# Patient Record
Sex: Male | Born: 1955
Health system: Southern US, Community
[De-identification: ages and names within clinical notes are randomized; demographics above are authoritative.]

## PROBLEM LIST (undated history)

## (undated) DIAGNOSIS — M549 Dorsalgia, unspecified: Secondary | ICD-10-CM

## (undated) DIAGNOSIS — J189 Pneumonia, unspecified organism: Secondary | ICD-10-CM

## (undated) DIAGNOSIS — F32A Depression, unspecified: Secondary | ICD-10-CM

## (undated) DIAGNOSIS — U071 COVID-19: Secondary | ICD-10-CM

## (undated) DIAGNOSIS — I10 Essential (primary) hypertension: Secondary | ICD-10-CM

## (undated) DIAGNOSIS — M1990 Unspecified osteoarthritis, unspecified site: Secondary | ICD-10-CM

## (undated) DIAGNOSIS — M67919 Unspecified disorder of synovium and tendon, unspecified shoulder: Secondary | ICD-10-CM

## (undated) DIAGNOSIS — F329 Major depressive disorder, single episode, unspecified: Secondary | ICD-10-CM

## (undated) DIAGNOSIS — H269 Unspecified cataract: Secondary | ICD-10-CM

## (undated) DIAGNOSIS — E119 Type 2 diabetes mellitus without complications: Secondary | ICD-10-CM

## (undated) DIAGNOSIS — E785 Hyperlipidemia, unspecified: Secondary | ICD-10-CM

## (undated) DIAGNOSIS — N289 Disorder of kidney and ureter, unspecified: Secondary | ICD-10-CM

## (undated) HISTORY — PX: LITHOTRIPSY: SUR834

## (undated) HISTORY — PX: VASECTOMY: SHX75

## (undated) HISTORY — PX: EYE SURGERY: SHX253

## (undated) HISTORY — PX: FOOT SURGERY: SHX648

## (undated) HISTORY — PX: HERNIA REPAIR: SHX51

## (undated) HISTORY — PX: ANTERIOR FUSION CERVICAL SPINE: SUR626

---

## 2001-04-06 ENCOUNTER — Encounter: Payer: Self-pay | Admitting: Emergency Medicine

## 2001-04-06 ENCOUNTER — Emergency Department (HOSPITAL_COMMUNITY): Admission: EM | Admit: 2001-04-06 | Discharge: 2001-04-06 | Payer: Self-pay | Admitting: Emergency Medicine

## 2002-04-22 ENCOUNTER — Encounter: Admission: RE | Admit: 2002-04-22 | Discharge: 2002-04-22 | Payer: Self-pay | Admitting: Gastroenterology

## 2002-04-22 ENCOUNTER — Encounter: Payer: Self-pay | Admitting: Gastroenterology

## 2004-01-19 ENCOUNTER — Emergency Department (HOSPITAL_COMMUNITY): Admission: EM | Admit: 2004-01-19 | Discharge: 2004-01-20 | Payer: Self-pay | Admitting: Emergency Medicine

## 2005-06-20 HISTORY — PX: ANTERIOR FUSION CERVICAL SPINE: SUR626

## 2011-07-28 DIAGNOSIS — R319 Hematuria, unspecified: Secondary | ICD-10-CM | POA: Insufficient documentation

## 2011-07-28 DIAGNOSIS — Z87442 Personal history of urinary calculi: Secondary | ICD-10-CM | POA: Insufficient documentation

## 2011-11-15 DIAGNOSIS — R319 Hematuria, unspecified: Secondary | ICD-10-CM | POA: Insufficient documentation

## 2013-08-12 ENCOUNTER — Ambulatory Visit (INDEPENDENT_AMBULATORY_CARE_PROVIDER_SITE_OTHER): Payer: Managed Care, Other (non HMO) | Admitting: Physical Therapy

## 2013-08-12 DIAGNOSIS — M6281 Muscle weakness (generalized): Secondary | ICD-10-CM

## 2013-08-12 DIAGNOSIS — R293 Abnormal posture: Secondary | ICD-10-CM

## 2013-08-12 DIAGNOSIS — M4802 Spinal stenosis, cervical region: Secondary | ICD-10-CM

## 2013-08-12 DIAGNOSIS — M2550 Pain in unspecified joint: Secondary | ICD-10-CM

## 2013-08-12 DIAGNOSIS — M2560 Stiffness of unspecified joint, not elsewhere classified: Secondary | ICD-10-CM

## 2013-08-19 ENCOUNTER — Encounter (INDEPENDENT_AMBULATORY_CARE_PROVIDER_SITE_OTHER): Payer: Managed Care, Other (non HMO) | Admitting: Physical Therapy

## 2013-08-19 DIAGNOSIS — M4802 Spinal stenosis, cervical region: Secondary | ICD-10-CM

## 2013-08-19 DIAGNOSIS — R293 Abnormal posture: Secondary | ICD-10-CM

## 2013-08-19 DIAGNOSIS — M2560 Stiffness of unspecified joint, not elsewhere classified: Secondary | ICD-10-CM

## 2013-08-19 DIAGNOSIS — M6281 Muscle weakness (generalized): Secondary | ICD-10-CM

## 2013-08-19 DIAGNOSIS — M2550 Pain in unspecified joint: Secondary | ICD-10-CM

## 2013-08-21 ENCOUNTER — Encounter (INDEPENDENT_AMBULATORY_CARE_PROVIDER_SITE_OTHER): Payer: Managed Care, Other (non HMO) | Admitting: Physical Therapy

## 2013-08-21 DIAGNOSIS — M6281 Muscle weakness (generalized): Secondary | ICD-10-CM

## 2013-08-21 DIAGNOSIS — M2550 Pain in unspecified joint: Secondary | ICD-10-CM

## 2013-08-21 DIAGNOSIS — M4802 Spinal stenosis, cervical region: Secondary | ICD-10-CM

## 2013-08-21 DIAGNOSIS — R293 Abnormal posture: Secondary | ICD-10-CM

## 2013-08-21 DIAGNOSIS — M2560 Stiffness of unspecified joint, not elsewhere classified: Secondary | ICD-10-CM

## 2013-08-26 ENCOUNTER — Encounter (INDEPENDENT_AMBULATORY_CARE_PROVIDER_SITE_OTHER): Payer: Managed Care, Other (non HMO) | Admitting: Physical Therapy

## 2013-08-26 DIAGNOSIS — M2550 Pain in unspecified joint: Secondary | ICD-10-CM

## 2013-08-26 DIAGNOSIS — M4802 Spinal stenosis, cervical region: Secondary | ICD-10-CM

## 2013-08-26 DIAGNOSIS — M6281 Muscle weakness (generalized): Secondary | ICD-10-CM

## 2013-08-26 DIAGNOSIS — M2560 Stiffness of unspecified joint, not elsewhere classified: Secondary | ICD-10-CM

## 2013-08-28 ENCOUNTER — Encounter (INDEPENDENT_AMBULATORY_CARE_PROVIDER_SITE_OTHER): Payer: Managed Care, Other (non HMO) | Admitting: Physical Therapy

## 2013-08-28 DIAGNOSIS — M2550 Pain in unspecified joint: Secondary | ICD-10-CM

## 2013-08-28 DIAGNOSIS — M6281 Muscle weakness (generalized): Secondary | ICD-10-CM

## 2013-08-28 DIAGNOSIS — M4802 Spinal stenosis, cervical region: Secondary | ICD-10-CM

## 2013-08-28 DIAGNOSIS — M2560 Stiffness of unspecified joint, not elsewhere classified: Secondary | ICD-10-CM

## 2013-08-28 DIAGNOSIS — R293 Abnormal posture: Secondary | ICD-10-CM

## 2013-09-02 ENCOUNTER — Encounter (INDEPENDENT_AMBULATORY_CARE_PROVIDER_SITE_OTHER): Payer: Managed Care, Other (non HMO) | Admitting: Physical Therapy

## 2013-09-02 DIAGNOSIS — M4802 Spinal stenosis, cervical region: Secondary | ICD-10-CM

## 2013-09-02 DIAGNOSIS — M2560 Stiffness of unspecified joint, not elsewhere classified: Secondary | ICD-10-CM

## 2013-09-02 DIAGNOSIS — M6281 Muscle weakness (generalized): Secondary | ICD-10-CM

## 2013-09-02 DIAGNOSIS — M2550 Pain in unspecified joint: Secondary | ICD-10-CM

## 2013-09-06 ENCOUNTER — Encounter (INDEPENDENT_AMBULATORY_CARE_PROVIDER_SITE_OTHER): Payer: Managed Care, Other (non HMO) | Admitting: Physical Therapy

## 2013-09-06 DIAGNOSIS — R293 Abnormal posture: Secondary | ICD-10-CM

## 2013-09-06 DIAGNOSIS — M4802 Spinal stenosis, cervical region: Secondary | ICD-10-CM

## 2013-09-06 DIAGNOSIS — M6281 Muscle weakness (generalized): Secondary | ICD-10-CM

## 2013-09-06 DIAGNOSIS — M2550 Pain in unspecified joint: Secondary | ICD-10-CM

## 2013-09-09 ENCOUNTER — Encounter (INDEPENDENT_AMBULATORY_CARE_PROVIDER_SITE_OTHER): Payer: Managed Care, Other (non HMO)

## 2013-09-09 DIAGNOSIS — M4802 Spinal stenosis, cervical region: Secondary | ICD-10-CM

## 2013-09-09 DIAGNOSIS — R293 Abnormal posture: Secondary | ICD-10-CM

## 2013-09-09 DIAGNOSIS — M2560 Stiffness of unspecified joint, not elsewhere classified: Secondary | ICD-10-CM

## 2013-09-09 DIAGNOSIS — M6281 Muscle weakness (generalized): Secondary | ICD-10-CM

## 2013-09-09 DIAGNOSIS — M2550 Pain in unspecified joint: Secondary | ICD-10-CM

## 2013-09-11 ENCOUNTER — Encounter (INDEPENDENT_AMBULATORY_CARE_PROVIDER_SITE_OTHER): Payer: Managed Care, Other (non HMO)

## 2013-09-11 DIAGNOSIS — M2560 Stiffness of unspecified joint, not elsewhere classified: Secondary | ICD-10-CM

## 2013-09-11 DIAGNOSIS — R293 Abnormal posture: Secondary | ICD-10-CM

## 2013-09-11 DIAGNOSIS — M6281 Muscle weakness (generalized): Secondary | ICD-10-CM

## 2013-09-11 DIAGNOSIS — M2550 Pain in unspecified joint: Secondary | ICD-10-CM

## 2013-09-14 ENCOUNTER — Emergency Department
Admission: EM | Admit: 2013-09-14 | Discharge: 2013-09-14 | Disposition: A | Discharge status: Home / Self Care | Payer: Managed Care, Other (non HMO) | Source: Home / Self Care | Attending: Family Medicine | Admitting: Family Medicine

## 2013-09-14 ENCOUNTER — Encounter: Payer: Self-pay | Admitting: Emergency Medicine

## 2013-09-14 ENCOUNTER — Emergency Department (INDEPENDENT_AMBULATORY_CARE_PROVIDER_SITE_OTHER): Payer: Managed Care, Other (non HMO)

## 2013-09-14 DIAGNOSIS — M25569 Pain in unspecified knee: Secondary | ICD-10-CM

## 2013-09-14 DIAGNOSIS — S93402A Sprain of unspecified ligament of left ankle, initial encounter: Secondary | ICD-10-CM

## 2013-09-14 DIAGNOSIS — M25579 Pain in unspecified ankle and joints of unspecified foot: Secondary | ICD-10-CM

## 2013-09-14 DIAGNOSIS — S93409A Sprain of unspecified ligament of unspecified ankle, initial encounter: Secondary | ICD-10-CM

## 2013-09-14 DIAGNOSIS — S93602A Unspecified sprain of left foot, initial encounter: Secondary | ICD-10-CM

## 2013-09-14 DIAGNOSIS — S93609A Unspecified sprain of unspecified foot, initial encounter: Secondary | ICD-10-CM

## 2013-09-14 DIAGNOSIS — S83429A Sprain of lateral collateral ligament of unspecified knee, initial encounter: Secondary | ICD-10-CM

## 2013-09-14 HISTORY — DX: Depression, unspecified: F32.A

## 2013-09-14 HISTORY — DX: Dorsalgia, unspecified: M54.9

## 2013-09-14 HISTORY — DX: Disorder of kidney and ureter, unspecified: N28.9

## 2013-09-14 HISTORY — DX: Essential (primary) hypertension: I10

## 2013-09-14 HISTORY — DX: Type 2 diabetes mellitus without complications: E11.9

## 2013-09-14 HISTORY — DX: Major depressive disorder, single episode, unspecified: F32.9

## 2013-09-14 HISTORY — DX: Hyperlipidemia, unspecified: E78.5

## 2013-09-14 NOTE — ED Notes (Signed)
Reports falling down 2 steps at home and twisted while landing with resulting pain in left ankle, knee and hip; 2 hours ago; no open wounds; ambulatory; used ice packs prior to arrival but no meds.

## 2013-09-14 NOTE — ED Provider Notes (Signed)
CSN: [id number]     Arrival date & time 09/14/13  1230 History   First MD Initiated Contact with Patient 09/14/13 1242     Chief Complaint  Patient presents with  . Leg Pain      HPI Comments: Patient had some mud on his shoe and slipped at the top of four steps.  He fell, and his left foot caught under the top tread as he was falling.  He twisted his left knee, ankle and foot.  He states that he has lower leg peripheral neuropathy and normally has decreased sensation in his feet and toes.   Patient is a 58 y.o. male presenting with leg pain. The history is provided by the patient and the spouse.  Leg Pain Location:  Ankle, foot and knee Time since incident:  2 hours Injury: yes   Mechanism of injury: fall   Fall:    Fall occurred:  Down stairs   Impact surface:  Hard floor   Point of impact: right hip.   Entrapped after fall: no   Knee location:  L knee Ankle location:  L ankle Foot location:  L foot Pain details:    Quality:  Aching   Radiates to: left thigh.   Severity:  Moderate   Onset quality:  Sudden   Duration:  2 hours   Timing:  Constant   Progression:  Unchanged Chronicity:  New Dislocation: no   Prior injury to area:  No Relieved by:  None tried Worsened by:  Bearing weight and flexion Ineffective treatments:  None tried Associated symptoms: decreased ROM, stiffness and swelling   Associated symptoms: no back pain, no muscle weakness, no numbness and no tingling     Past Medical History  Diagnosis Date  . Hyperlipidemia   . Hypertension   . Diabetes mellitus without complication   . Renal disorder     calculi  . Spinal pain   . Depression    Past Surgical History  Procedure Laterality Date  . Vasectomy Bilateral   . Hernia repair    . Lithotripsy    . Foot surgery    . Anterior fusion cervical spine     Family History  Problem Relation Age of Onset  . Hypertension Mother   . Heart failure Father   . Diabetes Father   . Hypertension Father     . Heart failure Sister   . Hypertension Sister    History  Substance Use Topics  . Smoking status: Current Every Day Smoker  . Smokeless tobacco: Not on file  . Alcohol Use: Yes    Review of Systems  Musculoskeletal: Positive for stiffness. Negative for back pain.  All other systems reviewed and are negative.    Allergies  Review of patient's allergies indicates no known allergies.  Home Medications   Current Outpatient Rx  Name  Route  Sig  Dispense  Refill  . aspirin 81 MG chewable tablet   Oral   Chew by mouth daily.         [name] buPROPion (WELLBUTRIN XL) 300 MG 24 hr tablet   Oral   Take 300 mg by mouth daily.         [name] gabapentin (NEURONTIN) 300 MG capsule   Oral   Take 300 mg by mouth 3 (three) times daily.         [name] glipiZIDE (GLUCOTROL) 10 MG tablet   Oral   Take 10 mg by mouth daily before breakfast.         .  lisinopril-hydrochlorothiazide (PRINZIDE,ZESTORETIC) 20-25 MG per tablet   Oral   Take 1 tablet by mouth daily.         . meloxicam (MOBIC) 7.5 MG tablet   Oral   Take 7.5 mg by mouth daily.         . simvastatin (ZOCOR) 40 MG tablet   Oral   Take 40 mg by mouth daily.         [name]. zolpidem (AMBIEN) 5 MG tablet   Oral   Take 10 mg by mouth at bedtime as needed for sleep.          BP 110/74  Pulse 98  Temp(Src) 97.6 F (36.4 C) (Oral)  Resp 16  Ht 6\' 1"  (1.854 m)  Wt 235 lb (106.595 kg)  BMI 31.01 kg/m2  SpO2 99% Physical Exam  Nursing note and vitals reviewed. Constitutional: He is oriented to person, place, and time. He appears well-developed and well-nourished. No distress.  Patient is obese (BMI 31.0)  HENT:  Head: Atraumatic.  Eyes: Conjunctivae are normal. Pupils are equal, round, and reactive to light.  Cardiovascular: Normal heart sounds.   Pulmonary/Chest: Breath sounds normal.  Abdominal: There is no tenderness.  Musculoskeletal:       Left knee: He exhibits bony tenderness. He exhibits normal range of  motion, no swelling, no effusion, no ecchymosis, no deformity, no laceration, normal alignment, no LCL laxity, normal patellar mobility, normal meniscus and no MCL laxity. Tenderness found. Lateral joint line and LCL tenderness noted. No MCL and no patellar tendon tenderness noted.       Left ankle: He exhibits decreased range of motion and swelling. He exhibits no ecchymosis, no deformity, no laceration and normal pulse. Tenderness. Lateral malleolus, AITFL, CF ligament, posterior TFL and proximal fibula tenderness found. No medial malleolus and no head of 5th metatarsal tenderness found. Achilles tendon normal.       Legs:      Left foot: He exhibits tenderness, bony tenderness and swelling. He exhibits normal range of motion, normal capillary refill, no crepitus, no deformity and no laceration.       Feet:  Left knee has good passive range of motion.  Negative swelling/effusion.  Tenderness over lateral joint line and LCL.  Negative McMurray test.  Negative drawer test.  Left ankle has tenderness/swelling over lateral malleolus.  Left foot has tenderness dorsally over distal metatarsals.  Decreased sensation as result of peripheral neuropathy.  Left hip has good range of motion without tenderness to palpation.  Neurological: He is alert and oriented to person, place, and time.  Skin: Skin is warm and dry.    ED Course  Procedures  none   Imaging Review Dg Ankle Complete Left  09/14/2013   CLINICAL DATA:  Fall and pain.  EXAM: LEFT ANKLE COMPLETE - 3+ VIEW  COMPARISON:  DG FOOT COMPLETE*L* dated 09/14/2013  FINDINGS: Negative for a fracture or dislocation. Mild spurring involving the calcaneus. Small spur along the dorsal aspect of the navicular bone.  IMPRESSION: No acute bone abnormality to the left ankle.   Electronically Signed   By: Richarda OverlieAdam  Henn M.D.   On: 09/14/2013 13:50   Dg Knee Complete 4 Views Left  09/14/2013   CLINICAL DATA:  Fall.  Twisting injury to left knee.  Pain.  EXAM:  LEFT KNEE - COMPLETE 4+ VIEW  COMPARISON:  None.  FINDINGS: There is no evidence of fracture, dislocation, or joint effusion. There is no evidence of arthropathy or other focal bone  abnormality. Soft tissues are unremarkable.  IMPRESSION: Negative.   Electronically Signed   By: Charlett Nose M.D.   On: 09/14/2013 13:51   Dg Foot Complete Left  09/14/2013   CLINICAL DATA:  Fall and left foot pain.  EXAM: LEFT FOOT - COMPLETE 3+ VIEW  COMPARISON:  None.  FINDINGS: Mild irregularity of the second toe proximal phalanx could represent an old injury. Alignment of the foot is normal. Small spurs along the plantar aspect of the calcaneus. No acute fracture or dislocation.  IMPRESSION: No acute bone abnormality to the left foot.   Electronically Signed   By: Richarda Overlie M.D.   On: 09/14/2013 13:47     MDM   1. Lateral collateral ligament sprain of knee   2. Left ankle sprain   3. Sprain of left foot    Ace wrap applied to left knee.  Ace wrap applied to left ankle and foot.  Ankle brace dispensed. Apply ice pack for 30 minutes every 1 to 2 hours today and tomorrow.  Elevate.  Use crutches for 3 to 5 days (already has crutches at home). Wear Ace wraps until swelling decreases.  Wear ankle brace for about 2 to 3 weeks.  Begin range of motion and stretching exercises in about 5 days as per instruction sheet. May take Ibuprofen 200mg , 3 or 4 tabs every 8 hours with food.  Followup with Dr[facility] ([facility]) if not improving about two weeks.     Lattie Haw, MD 09/14/13 1430

## 2013-09-14 NOTE — Discharge Instructions (Signed)
Apply ice pack for 30 minutes every 1 to 2 hours today and tomorrow.  Elevate.  Use crutches for 3 to 5 days.  Wear Ace wraps until swelling decreases.  Wear ankle brace for about 2 to 3 weeks.  Begin range of motion and stretching exercises in about 5 days as per instruction sheet. May take Ibuprofen 200mg , 3 or 4 tabs every 8 hours with food.

## 2013-09-16 ENCOUNTER — Encounter (INDEPENDENT_AMBULATORY_CARE_PROVIDER_SITE_OTHER): Payer: Managed Care, Other (non HMO) | Admitting: Physical Therapy

## 2013-09-16 DIAGNOSIS — M6281 Muscle weakness (generalized): Secondary | ICD-10-CM

## 2013-09-16 DIAGNOSIS — M2560 Stiffness of unspecified joint, not elsewhere classified: Secondary | ICD-10-CM

## 2013-09-16 DIAGNOSIS — M2550 Pain in unspecified joint: Secondary | ICD-10-CM

## 2013-09-16 DIAGNOSIS — M4802 Spinal stenosis, cervical region: Secondary | ICD-10-CM

## 2013-09-16 DIAGNOSIS — R293 Abnormal posture: Secondary | ICD-10-CM

## 2013-09-18 ENCOUNTER — Encounter (INDEPENDENT_AMBULATORY_CARE_PROVIDER_SITE_OTHER): Payer: Managed Care, Other (non HMO)

## 2013-09-18 DIAGNOSIS — M2560 Stiffness of unspecified joint, not elsewhere classified: Secondary | ICD-10-CM

## 2013-09-18 DIAGNOSIS — M6281 Muscle weakness (generalized): Secondary | ICD-10-CM

## 2013-09-18 DIAGNOSIS — M4802 Spinal stenosis, cervical region: Secondary | ICD-10-CM

## 2013-09-18 DIAGNOSIS — M2550 Pain in unspecified joint: Secondary | ICD-10-CM

## 2013-09-18 DIAGNOSIS — R293 Abnormal posture: Secondary | ICD-10-CM

## 2013-09-23 ENCOUNTER — Encounter (INDEPENDENT_AMBULATORY_CARE_PROVIDER_SITE_OTHER): Payer: Managed Care, Other (non HMO) | Admitting: Physical Therapy

## 2013-09-23 DIAGNOSIS — M2550 Pain in unspecified joint: Secondary | ICD-10-CM

## 2013-09-23 DIAGNOSIS — R293 Abnormal posture: Secondary | ICD-10-CM

## 2013-09-23 DIAGNOSIS — M6281 Muscle weakness (generalized): Secondary | ICD-10-CM

## 2013-09-23 DIAGNOSIS — M2560 Stiffness of unspecified joint, not elsewhere classified: Secondary | ICD-10-CM

## 2013-09-23 DIAGNOSIS — M4802 Spinal stenosis, cervical region: Secondary | ICD-10-CM

## 2013-09-25 ENCOUNTER — Encounter (INDEPENDENT_AMBULATORY_CARE_PROVIDER_SITE_OTHER): Payer: Managed Care, Other (non HMO)

## 2013-09-25 DIAGNOSIS — R293 Abnormal posture: Secondary | ICD-10-CM

## 2013-09-25 DIAGNOSIS — M4802 Spinal stenosis, cervical region: Secondary | ICD-10-CM

## 2013-09-25 DIAGNOSIS — M6281 Muscle weakness (generalized): Secondary | ICD-10-CM

## 2013-09-25 DIAGNOSIS — M2550 Pain in unspecified joint: Secondary | ICD-10-CM

## 2013-09-25 DIAGNOSIS — M2560 Stiffness of unspecified joint, not elsewhere classified: Secondary | ICD-10-CM

## 2013-09-30 ENCOUNTER — Encounter: Payer: Managed Care, Other (non HMO) | Admitting: Physical Therapy

## 2013-10-02 ENCOUNTER — Encounter: Payer: Managed Care, Other (non HMO) | Admitting: Physical Therapy

## 2013-10-17 DIAGNOSIS — M4722 Other spondylosis with radiculopathy, cervical region: Secondary | ICD-10-CM | POA: Insufficient documentation

## 2014-07-25 ENCOUNTER — Encounter: Payer: Self-pay | Admitting: Sports Medicine

## 2014-07-25 ENCOUNTER — Ambulatory Visit (INDEPENDENT_AMBULATORY_CARE_PROVIDER_SITE_OTHER): Payer: Managed Care, Other (non HMO) | Admitting: Sports Medicine

## 2014-07-25 VITALS — BP 121/78 | HR 115 | Ht 73.0 in | Wt 236.0 lb

## 2014-07-25 DIAGNOSIS — E785 Hyperlipidemia, unspecified: Secondary | ICD-10-CM

## 2014-07-25 DIAGNOSIS — Z0000 Encounter for general adult medical examination without abnormal findings: Secondary | ICD-10-CM | POA: Insufficient documentation

## 2014-07-25 DIAGNOSIS — I10 Essential (primary) hypertension: Secondary | ICD-10-CM

## 2014-07-25 DIAGNOSIS — E119 Type 2 diabetes mellitus without complications: Secondary | ICD-10-CM

## 2014-07-25 DIAGNOSIS — N200 Calculus of kidney: Secondary | ICD-10-CM

## 2014-07-25 LAB — BASIC METABOLIC PANEL WITH GFR
BUN: 20 mg/dL (ref 6–23)
CO2: 25 meq/L (ref 19–32)
Chloride: 99 meq/L (ref 96–112)
Creat: 1.25 mg/dL (ref 0.50–1.35)
Glucose, Bld: 231 mg/dL — ABNORMAL HIGH (ref 70–99)
Potassium: 4.3 meq/L (ref 3.5–5.3)

## 2014-07-25 LAB — BASIC METABOLIC PANEL
Calcium: 9.6 mg/dL (ref 8.4–10.5)
Sodium: 140 mEq/L (ref 135–145)

## 2014-07-25 MED ORDER — HYDROMORPHONE HCL 4 MG PO TABS: ORAL_TABLET | ORAL | Status: DC

## 2014-07-25 MED ORDER — KETOROLAC TROMETHAMINE 10 MG PO TABS: 10.0000 mg | ORAL_TABLET | Freq: Three times a day (TID) | ORAL | Status: DC | PRN

## 2014-07-25 MED ORDER — DULAGLUTIDE 1.5 MG/0.5ML ~~LOC~~ SOAJ: SUBCUTANEOUS | Status: DC

## 2014-07-25 MED ORDER — DAPAGLIFLOZIN PRO-METFORMIN ER 10-1000 MG PO TB24: 1.0000 | ORAL_TABLET | Freq: Every day | ORAL | Status: DC

## 2014-07-25 NOTE — Progress Notes (Signed)
  Subjective:    CC: Establish care.   HPI:  Nephrolithiasis: Has two nephroliths in each ureter, is recently post stenting, and has lithotripsy scheduled for Monday. History of multiple nephroliths. Insufficient pain control with Percocet. Pain is severe, persistent and localized in the back of radiation to the groin. No fevers or chills, this is all currently managed by urology.  Diabetes mellitus type 2: Eye exam 2 months ago, foot exam 5 months ago, currently taking Invokamet and Lantus.  He has never been on a GLP-1 inhibitor.  Hypertension: Mildly elevated, usually controlled on the current dosage  Hyperlipidemia: Stable on Lipitor.  Preventive measures: Due for colonoscopy.  Past medical history, Surgical history, Family history not pertinant except as noted below, Social history, Allergies, and medications have been entered into the medical record, reviewed, and no changes needed.   Review of Systems: No headache, visual changes, nausea, vomiting, diarrhea, constipation, dizziness, abdominal pain, skin rash, fevers, chills, night sweats, swollen lymph nodes, weight loss, chest pain, body aches, joint swelling, muscle aches, shortness of breath, mood changes, visual or auditory hallucinations.  Objective:    General: Well Developed, well nourished, and in no acute distress.  Neuro: Alert and oriented x3, extra-ocular muscles intact, sensation grossly intact.  HEENT: Normocephalic, atraumatic, pupils equal round reactive to light, neck supple, no masses, no lymphadenopathy, thyroid nonpalpable.  Skin: Warm and dry, no rashes noted.  Cardiac: Regular rate and rhythm, no murmurs rubs or gallops.  Respiratory: Clear to auscultation bilaterally. Not using accessory muscles, speaking in full sentences.  Abdominal: Soft, nontender, nondistended, positive bowel sounds, no masses, no organomegaly.  Musculoskeletal: Shoulder, elbow, wrist, hip, knee, ankle stable, and with full range of  motion.  Impression and Recommendations:    The patient was counselled, risk factors were discussed, anticipatory guidance given.

## 2014-07-25 NOTE — Assessment & Plan Note (Signed)
Short course of oral Toradol and oral Dilaudid, he does have a lithotripsy coming up on Monday.

## 2014-07-25 NOTE — Assessment & Plan Note (Addendum)
We are going to revamp his entire regimen, switching to XigDuo and adding Trulicity. Up-to-date on eye and foot exams, we will work with other health maintenance at a future visit. I am going to check his renal function to ensure we don't need to hold his metformin considering a large bilateral nephroliths recently post stenting. Return in 3 months to recheck A1c.

## 2014-07-25 NOTE — Assessment & Plan Note (Signed)
No changes

## 2014-07-25 NOTE — Assessment & Plan Note (Signed)
Continue Zocor, no changes. 

## 2014-07-25 NOTE — Assessment & Plan Note (Signed)
Adding referral for routine screening colonoscopy.

## 2014-08-28 ENCOUNTER — Other Ambulatory Visit: Payer: Self-pay | Admitting: Sports Medicine

## 2014-08-28 DIAGNOSIS — F17200 Nicotine dependence, unspecified, uncomplicated: Secondary | ICD-10-CM

## 2014-08-28 DIAGNOSIS — Z87891 Personal history of nicotine dependence: Secondary | ICD-10-CM | POA: Insufficient documentation

## 2014-08-28 MED ORDER — VARENICLINE TARTRATE 1 MG PO TABS: 1.0000 mg | ORAL_TABLET | Freq: Two times a day (BID) | ORAL | Status: DC

## 2014-08-28 MED ORDER — VARENICLINE TARTRATE 0.5 MG X 11 & 1 MG X 42 PO MISC: ORAL | Status: DC

## 2014-08-28 NOTE — Assessment & Plan Note (Signed)
Starting Chantix. 

## 2014-10-18 ENCOUNTER — Encounter: Payer: Self-pay | Admitting: Sports Medicine

## 2014-10-23 ENCOUNTER — Ambulatory Visit (INDEPENDENT_AMBULATORY_CARE_PROVIDER_SITE_OTHER): Payer: Managed Care, Other (non HMO) | Admitting: Sports Medicine

## 2014-10-23 ENCOUNTER — Encounter: Payer: Self-pay | Admitting: Sports Medicine

## 2014-10-23 VITALS — BP 115/76 | HR 80 | Ht 73.0 in | Wt 225.0 lb

## 2014-10-23 DIAGNOSIS — E119 Type 2 diabetes mellitus without complications: Secondary | ICD-10-CM

## 2014-10-23 DIAGNOSIS — I10 Essential (primary) hypertension: Secondary | ICD-10-CM | POA: Diagnosis not present

## 2014-10-23 DIAGNOSIS — E1122 Type 2 diabetes mellitus with diabetic chronic kidney disease: Secondary | ICD-10-CM

## 2014-10-23 DIAGNOSIS — N200 Calculus of kidney: Secondary | ICD-10-CM | POA: Diagnosis not present

## 2014-10-23 DIAGNOSIS — E1129 Type 2 diabetes mellitus with other diabetic kidney complication: Secondary | ICD-10-CM

## 2014-10-23 DIAGNOSIS — N189 Chronic kidney disease, unspecified: Secondary | ICD-10-CM

## 2014-10-23 LAB — POCT GLYCOSYLATED HEMOGLOBIN (HGB A1C): Hemoglobin A1C: 10.9

## 2014-10-23 MED ORDER — BUPROPION HCL ER (XL) 300 MG PO TB24: 300.0000 mg | ORAL_TABLET | Freq: Every day | ORAL | Status: DC

## 2014-10-23 MED ORDER — INSULIN GLARGINE 300 UNIT/ML ~~LOC~~ SOPN: 36.0000 [IU] | PEN_INJECTOR | Freq: Every day | SUBCUTANEOUS | Status: DC

## 2014-10-23 MED ORDER — GABAPENTIN 300 MG PO CAPS: 300.0000 mg | ORAL_CAPSULE | Freq: Three times a day (TID) | ORAL | Status: DC

## 2014-10-23 MED ORDER — MELOXICAM 15 MG PO TABS: 15.0000 mg | ORAL_TABLET | Freq: Every day | ORAL | Status: DC

## 2014-10-23 MED ORDER — ZOLPIDEM TARTRATE 5 MG PO TABS: 10.0000 mg | ORAL_TABLET | Freq: Every evening | ORAL | Status: DC | PRN

## 2014-10-23 MED ORDER — SIMVASTATIN 40 MG PO TABS: 40.0000 mg | ORAL_TABLET | Freq: Every day | ORAL | Status: DC

## 2014-10-23 NOTE — Assessment & Plan Note (Signed)
Well controlled, no changes 

## 2014-10-23 NOTE — Assessment & Plan Note (Signed)
Unfortunately continues to be uncontrolled. He was initially on 36 units of Lantus. We are going to re-add Toujeo. He actually can discontinue his Trulicity. He will be on the standard up taper regimen. Return to see me in 3 months to recheck A1c.

## 2014-10-23 NOTE — Assessment & Plan Note (Signed)
Doing well, resolved post lithotripsy.

## 2014-10-23 NOTE — Progress Notes (Signed)
  Subjective:    CC: Follow-up  HPI: Diabetes mellitus type 2: Unfortunately has not responded to current medications, understands will probably have to restart insulin.  Nephrolithiasis: Doing well.  Hypertension: Under moderate control.  Past medical history, Surgical history, Family history not pertinant except as noted below, Social history, Allergies, and medications have been entered into the medical record, reviewed, and no changes needed.   Review of Systems: No fevers, chills, night sweats, weight loss, chest pain, or shortness of breath.   Objective:    General: Well Developed, well nourished, and in no acute distress.  Neuro: Alert and oriented x3, extra-ocular muscles intact, sensation grossly intact.  HEENT: Normocephalic, atraumatic, pupils equal round reactive to light, neck supple, no masses, no lymphadenopathy, thyroid nonpalpable.  Skin: Warm and dry, no rashes. Cardiac: Regular rate and rhythm, no murmurs rubs or gallops, no lower extremity edema.  Respiratory: Clear to auscultation bilaterally. Not using accessory muscles, speaking in full sentences.  Impression and Recommendations:    I spent 40 minutes with this patient, greater than 50% was face-to-face time counseling regarding the above complex diagnoses

## 2014-10-28 ENCOUNTER — Encounter: Payer: Self-pay | Admitting: Sports Medicine

## 2014-10-28 ENCOUNTER — Other Ambulatory Visit: Payer: Self-pay

## 2014-10-28 MED ORDER — LISINOPRIL-HYDROCHLOROTHIAZIDE 20-25 MG PO TABS: 1.0000 | ORAL_TABLET | Freq: Every day | ORAL | Status: DC

## 2014-11-06 ENCOUNTER — Telehealth: Payer: Self-pay | Admitting: Sports Medicine

## 2014-11-06 NOTE — Telephone Encounter (Signed)
Received fax for prior authorization on Toujeo sent through cover my meds waiting on authorization. - CF °

## 2014-11-11 NOTE — Telephone Encounter (Signed)
Called [employer] to check status of PA and they said it is still in review and we should have an answer by Thursday. - CF

## 2014-11-11 NOTE — Telephone Encounter (Signed)
[facility] due to patient has not tried Lantus or Levemir. -CF

## 2014-11-12 NOTE — Telephone Encounter (Signed)
Per Dr. Benjamin Stainhekkekandam request, Rx for Nathen Mayoujeo has been removed and new Rx for Lantus has been added. Will route to provider for approval before Rx is sent to pharmacy.

## 2014-11-12 NOTE — Addendum Note (Signed)
Addended by: Collie SiadICHARDSON, Merrilee Ancona M on: 11/12/2014 10:05 AM   Modules accepted: Orders

## 2014-12-01 ENCOUNTER — Encounter: Payer: Self-pay | Admitting: Sports Medicine

## 2014-12-02 MED ORDER — INSULIN GLARGINE 300 UNIT/ML ~~LOC~~ SOPN: 58.0000 [IU] | PEN_INJECTOR | Freq: Every day | SUBCUTANEOUS | Status: DC

## 2014-12-24 ENCOUNTER — Encounter: Payer: Self-pay | Admitting: Sports Medicine

## 2014-12-30 MED ORDER — INSULIN GLARGINE 300 UNIT/ML ~~LOC~~ SOPN: 76.0000 [IU] | PEN_INJECTOR | Freq: Every day | SUBCUTANEOUS | Status: DC

## 2015-01-23 ENCOUNTER — Encounter: Payer: Self-pay | Admitting: Sports Medicine

## 2015-01-23 ENCOUNTER — Ambulatory Visit (INDEPENDENT_AMBULATORY_CARE_PROVIDER_SITE_OTHER): Payer: Managed Care, Other (non HMO)

## 2015-01-23 ENCOUNTER — Ambulatory Visit (INDEPENDENT_AMBULATORY_CARE_PROVIDER_SITE_OTHER): Payer: Managed Care, Other (non HMO) | Admitting: Sports Medicine

## 2015-01-23 VITALS — BP 118/74 | HR 68 | Ht 73.0 in | Wt 238.0 lb

## 2015-01-23 DIAGNOSIS — E119 Type 2 diabetes mellitus without complications: Secondary | ICD-10-CM | POA: Diagnosis not present

## 2015-01-23 DIAGNOSIS — Z23 Encounter for immunization: Secondary | ICD-10-CM

## 2015-01-23 DIAGNOSIS — E669 Obesity, unspecified: Secondary | ICD-10-CM | POA: Diagnosis not present

## 2015-01-23 DIAGNOSIS — M25511 Pain in right shoulder: Secondary | ICD-10-CM | POA: Diagnosis not present

## 2015-01-23 LAB — POCT GLYCOSYLATED HEMOGLOBIN (HGB A1C): Hemoglobin A1C: 8.3

## 2015-01-23 MED ORDER — PHENTERMINE HCL 37.5 MG PO TABS: ORAL_TABLET | ORAL | Status: DC

## 2015-01-23 NOTE — Assessment & Plan Note (Signed)
Starting phentermine, return monthly for weight checks and refills. 

## 2015-01-23 NOTE — Progress Notes (Signed)
  Subjective:    CC: Follow-up  HPI: Diabetes mellitus type 2: Currently 86 units of Toujeo, hemoglobin A1c started at 10.9, today 8.3, his blood sugars have only been controlled, morning blood sugars are approximately 100-115 he has not had any episodes of hypoglycemia.  Right shoulder pain: Moderate, persistent, localized over the deltoid without radiation. Worse with overhead activities.  Obesity: Amenable to work on weight loss with medication.  Past medical history, Surgical history, Family history not pertinant except as noted below, Social history, Allergies, and medications have been entered into the medical record, reviewed, and no changes needed.   Review of Systems: No fevers, chills, night sweats, weight loss, chest pain, or shortness of breath.   Objective:    General: Well Developed, well nourished, and in no acute distress.  Neuro: Alert and oriented x3, extra-ocular muscles intact, sensation grossly intact.  HEENT: Normocephalic, atraumatic, pupils equal round reactive to light, neck supple, no masses, no lymphadenopathy, thyroid nonpalpable.  Skin: Warm and dry, no rashes. Cardiac: Regular rate and rhythm, no murmurs rubs or gallops, no lower extremity edema.  Respiratory: Clear to auscultation bilaterally. Not using accessory muscles, speaking in full sentences. Right Shoulder: Inspection reveals no abnormalities, atrophy or asymmetry. Palpation is normal with no tenderness over AC joint or bicipital groove. ROM is full in all planes. Rotator cuff strength normal throughout. positive Neer and Hawkin's tests, empty can. Speeds and Yergason's tests normal. No labral pathology noted with negative Obrien's, negative crank, negative clunk, and good stability. Normal scapular function observed. No painful arc and no drop arm sign. No apprehension sign  Impression and Recommendations:

## 2015-01-23 NOTE — Assessment & Plan Note (Signed)
Right-sided, formal physical therapy for a single visit to learn exercises. Likely represents impingement syndrome

## 2015-01-23 NOTE — Assessment & Plan Note (Signed)
Up to 86 units of insulin. Blood sugars are in the lower 100s, A1c has improved to 8.3. He will continue the regimen as discussed. Also going to help him with weight loss.

## 2015-01-29 ENCOUNTER — Encounter: Payer: Self-pay | Admitting: Physical Therapy

## 2015-01-29 ENCOUNTER — Ambulatory Visit (INDEPENDENT_AMBULATORY_CARE_PROVIDER_SITE_OTHER): Payer: Managed Care, Other (non HMO) | Admitting: Physical Therapy

## 2015-01-29 DIAGNOSIS — R29898 Other symptoms and signs involving the musculoskeletal system: Secondary | ICD-10-CM | POA: Diagnosis not present

## 2015-01-29 DIAGNOSIS — R293 Abnormal posture: Secondary | ICD-10-CM

## 2015-01-29 DIAGNOSIS — M25511 Pain in right shoulder: Secondary | ICD-10-CM

## 2015-01-29 NOTE — Patient Instructions (Signed)
  Scapular Retraction (Standing)   With arms at sides, pinch shoulder blades together. Repeat _10___ times per set. Do _1___ sets per session. Do _4-5___ sessions per day.  Resisted External Rotation: in Neutral - Bilateral   PALMS UP Sit or stand, tubing in both hands, elbows at sides, bent to 90, forearms forward. Pinch shoulder blades together and rotate forearms out. Keep elbows at sides. Repeat __10__ times per set. Do _2-3___ sets per session. Do _2-3___ sessions per day.  Strengthening: Resisted Extension   Hold tubing in right hand, arm forward. Pull arm back, elbow straight. Repeat _10___ times per set. Do 2-3____ sets per session. Do 2-3____ sessions per day.  External Rotation (Isometric)   With elbow bent and held at side, use other hand to apply resistance to outward motion of arm. Hold __5__ seconds. Repeat _10___ times. Do _2-3___ sessions per day.  Copyright  [employer]. All rights reserved.

## 2015-01-29 NOTE — Therapy (Signed)
[facility] [place] [address] [place] [address] [place], [place], [address] Phone: [phone]   Fax:  [phone]  Physical Therapy Evaluation  Patient Details  Name: Robert Hines MRN: [id number] Date of Birth: 1956-01-01 Referring Provider:  Monica Becton,*  Encounter Date: 01/29/2015      PT End of Session - 01/29/15 1146    Visit Number 1   Number of Visits 4   Date for PT Re-Evaluation 02/19/15   PT Start Time 1146   PT Stop Time 1238   PT Time Calculation (min) 52 min   Activity Tolerance Patient limited by pain      Past Medical History  Diagnosis Date  . Hyperlipidemia   . Hypertension   . Diabetes mellitus without complication   . Renal disorder     calculi  . Spinal pain   . Depression     Past Surgical History  Procedure Laterality Date  . Vasectomy Bilateral   . Hernia repair    . Lithotripsy    . Foot surgery    . Anterior fusion cervical spine      There were no vitals filed for this visit.  Visit Diagnosis:  Pain in joint, shoulder region, right - Plan: PT plan of care cert/re-cert  Weakness of shoulder - Plan: PT plan of care cert/re-cert  Abnormal posture - Plan: PT plan of care cert/re-cert      Subjective Assessment - 01/29/15 1148    Subjective Pt reports that the Rt shoulder has started to bother him, he wants him to do therapy to learn exercise and follow up with him in a month for possible injecition   Pertinent History concerned that he is having pain break through even with gabepentin. Waking at night whenever he rolls onto the Rt side   Diagnostic tests x-rays of Rt shoulder a little arthrits    Patient Stated Goals climb ladders at work without shoulder pain , reach bend him and across midline, get rid of shoulder pain   Currently in Pain? No/denies  only with certain Rt shoulder positions            [facility] PT Assessment - 01/29/15 0001    Assessment   Medical Diagnosis Rt  shoulder impingement   Onset Date/Surgical Date 12/29/14   Hand Dominance Right   Next MD Visit 02/19/15   Prior Therapy not for this   Precautions   Precautions None   Balance Screen   Has the patient fallen in the past 6 months Yes   How many times? 1  off the couch   Has the patient had a decrease in activity level because of a fear of falling?  No   Is the patient reluctant to leave their home because of a fear of falling?  No   Prior Function   Level of Independence Independent   Vocation Full time employment   Vocation Requirements climb ladders, push/pull, lift 35#   Leisure splitting wood with an axe    Observation/Other Assessments   Focus on Therapeutic Outcomes (FOTO)  46% limited   Posture/Postural Control   Posture/Postural Control Postural limitations   Postural Limitations Rounded Shoulders;Forward head  elevated Rt shoulder complex   ROM / Strength   AROM / PROM / Strength AROM;Strength   AROM   AROM Assessment Site Cervical;Shoulder  ROM is at baseline for pt from previous surgery   Right/Left Shoulder Right  Lt WNL   Right Shoulder Extension 55 Degrees  Right Shoulder Flexion 131 Degrees   Right Shoulder ABduction 118 Degrees   Right Shoulder Internal Rotation 90 Degrees   Right Shoulder External Rotation 68 Degrees  with pain   Cervical Flexion 3 fingers from chest   Cervical Extension WNL   Cervical - Right Rotation WFL   Cervical - Left Rotation WFL   Strength   Overall Strength Comments --  midtraps 5-/5    Strength Assessment Site Shoulder  Elbow/wrist WNL   Right/Left Shoulder Right  Lt shoulder grossly 4+/5 from neck issues   Right Shoulder Flexion --  5-/5   Right Shoulder Extension 5/5   Right Shoulder ABduction 4+/5   Right Shoulder Internal Rotation 5/5   Right Shoulder External Rotation 4-/5  with pain   Flexibility   Soft Tissue Assessment /Muscle Length --  tight pecs bilat   Palpation   Palpation comment tender in Rt  supraspinatus and top of the shoulder   posterior Rt shoulder capsule tight   Special Tests    Special Tests Rotator Cuff Impingement   Rotator Cuff Impingment tests Hawkins- Kennedy test;Painful Arc of Motion  External rotation test (+)    Hawkins-Kennedy test   Findings Positive   Side Right   Painful Arc of Motion   Findings Positive   Side Right                   [facility] Adult PT Treatment/Exercise - 01/29/15 0001    Exercises   Exercises Shoulder   Shoulder Exercises: Supine   Horizontal ABduction --  attempted this however increased pain , stopped   Theraband Level (Shoulder Horizontal ABduction) Level 2 (Red)   Shoulder Exercises: Standing   External Rotation Strengthening;Both;10 reps;Theraband  standing with elbows by sides   Theraband Level (Shoulder External Rotation) Level 1 (Yellow)   Extension Strengthening;Right;10 reps;Theraband  scapular retraction   Theraband Level (Shoulder Extension) Level 1 (Yellow)   Shoulder Exercises: Isometric Strengthening   External Rotation --  10 x 5'   Modalities   Modalities Vasopneumatic   Vasopneumatic   Number Minutes Vasopneumatic  15 minutes   Vasopnuematic Location  Shoulder  Rt   Vasopneumatic Pressure Medium   Vasopneumatic Temperature  3*                PT Education - 01/29/15 1226    Education provided Yes   Education Details HEP   Person(s) Educated Patient   Methods Explanation;Demonstration;Handout   Comprehension Returned demonstration             PT Long Term Goals - 01/29/15 1241    PT LONG TERM GOAL #1   Title I with advanced HEP ( 02/19/15)   Time 3   Period Weeks   Status New   PT LONG TERM GOAL #2   Title demo painfree Rt shoulder ROM in all directions ( 02/19/15)    Time 3   Period Weeks   Status New   PT LONG TERM GOAL #3   Title demo Rt shoulder strength =/> 5-/5 without pain ( 02/19/15)    Time 3   Period Weeks   Status New   PT LONG TERM GOAL #4   Title reach  overhead to climb ladder without shoulder pain ( 02/19/15)   Time 3   Period Weeks   Status New   PT LONG TERM GOAL #5   Title improve FOTO =/< 31% impaired ( 02/19/15)    Time 3   Period Weeks  Status New               Plan - 01/29/15 1237    Clinical Impression Statement 59 yo male presents with classic Rt shoulder impingement symptoms. Significant pain with reaching overhead, behind and across his body.  Has functional weakness due to pain and postural abnormalities. Would benifit from skilled PT to teach ther ex for the shoulder, improve posture to prevent reoccurrence and restore PLOF   Pt will benefit from skilled therapeutic intervention in order to improve on the following deficits Pain;Postural dysfunction;Decreased strength;Impaired UE functional use;Decreased range of motion   Rehab Potential Good   PT Frequency 1x / week   PT Duration 4 weeks   PT Treatment/Interventions Moist Heat;Ultrasound;Therapeutic exercise;Vasopneumatic Device;Manual techniques;Cryotherapy;Electrical Stimulation;Iontophoresis /ml Dexamethasone;Patient/family education   PT Next Visit Plan progress postural ther ex and shoulders.    Consulted and Agree with Plan of Care Patient         Problem List Patient Active Problem List   Diagnosis Date Noted  . Obesity 01/23/2015  . Right shoulder pain 01/23/2015  . Smoker 08/28/2014  . Nephrolithiasis 07/25/2014  . Diabetes mellitus, type 2 07/25/2014  . Essential hypertension, benign 07/25/2014  . Annual physical exam 07/25/2014  . Hyperlipidemia 07/25/2014    Roderic Scarce PT 01/29/2015, 12:46 PM  [facility] [address] [place] [address] [place], [place], [address] Phone: [phone]   Fax:  [phone]

## 2015-02-04 ENCOUNTER — Ambulatory Visit (INDEPENDENT_AMBULATORY_CARE_PROVIDER_SITE_OTHER): Payer: Managed Care, Other (non HMO) | Admitting: Physical Therapy

## 2015-02-04 DIAGNOSIS — M25511 Pain in right shoulder: Secondary | ICD-10-CM

## 2015-02-04 DIAGNOSIS — R293 Abnormal posture: Secondary | ICD-10-CM

## 2015-02-04 DIAGNOSIS — R29898 Other symptoms and signs involving the musculoskeletal system: Secondary | ICD-10-CM

## 2015-02-04 NOTE — Therapy (Signed)
[facility] [address] [place], [place], [address] Phone: [phone]   Fax:  [phone]  Physical Therapy Treatment  Patient Details  Name: Robert Hines MRN: [id number] Date of Birth: 04/23/1956 Referring Provider:  Thekkekandam, Thomas J,*  Encounter Date: 02/04/2015      PT End of Session - 02/04/15 0753    Visit Number 2   Number of Visits 4   Date for PT Re-Evaluation 02/19/15   PT Start Time 0738   PT Stop Time 0835   PT Time Calculation (min) 57 min   Activity Tolerance Patient limited by pain      Past Medical History  Diagnosis Date  . Hyperlipidemia   . Hypertension   . Diabetes mellitus without complication   . Renal disorder     calculi  . Spinal pain   . Depression     Past Surgical History  Procedure Laterality Date  . Vasectomy Bilateral   . Hernia repair    . Lithotripsy    . Foot surgery    . Anterior fusion cervical spine      There were no vitals filed for this visit.  Visit Diagnosis:  Pain in joint, shoulder region, right  Abnormal posture  Weakness of shoulder      Subjective Assessment - 02/04/15 0743    Subjective Pt reports his Rt shoulder continues to wake him at night and is painful with moving it certain ways.    Currently in Pain? No/denies  up to 5/10 with end range flexion/abduction  (faces: 7/10 during therapeutic exercise)            [facility] PT Assessment - 02/04/15 0001    Assessment   Medical Diagnosis Rt shoulder impingement   Onset Date/Surgical Date 12/29/14   Hand Dominance Right   Next MD Visit 02/19/15   Prior Therapy not for this   AROM   Right Shoulder Flexion 125 Degrees  degrees, sitting   Right Shoulder ABduction 105 Degrees  degrees, sitting          [facility] Adult PT Treatment/Exercise - 02/04/15 0001    Exercises   Exercises Shoulder   Shoulder Exercises: Standing   External Rotation Strengthening;Right;10 reps  required VC  for form   Theraband Level (Shoulder External Rotation) Level 1 (Yellow)   Extension Strengthening;Right;10 reps   Theraband Level (Shoulder Extension) Level 1 (Yellow)   Row Strengthening;Right;10 reps;Theraband   Theraband Level (Shoulder Row) Level 1 (Yellow)   Retraction Both;10 reps  5 sec hold    Shoulder Elevation Both;5 reps;Seated   Shoulder Exercises: Pulleys   Flexion 2 minutes   ABduction 2 minutes   Shoulder Exercises: ROM/Strengthening   UBE (Upper Arm Bike) L2: 2 min forward, 1 min backward    Modalities   Modalities Vasopneumatic;Electrical Stimulation   Electrical Stimulation   Electrical Stimulation Location Rt shoulder    Electrical Stimulation Action IFC   Electrical Stimulation Parameters to tolerance x 15 min    Electrical Stimulation Goals Pain   Vasopneumatic   Number Minutes Vasopneumatic  15 minutes   Vasopnuematic Location  Shoulder   Vasopneumatic Pressure Medium   Vasopneumatic Temperature  3*   Manual Therapy   Manual Therapy Myofascial release   Myofascial Release to Rt levator, rhomboid, upper-mid thoracic paraspinals.              PT Long Term Goals - 01/29/15 1241    PT LONG TERM GOAL #1     Title I with advanced HEP ( 02/19/15)   Time 3   Period Weeks   Status New   PT LONG TERM GOAL #2   Title demo painfree Rt shoulder ROM in all directions ( 02/19/15)    Time 3   Period Weeks   Status New   PT LONG TERM GOAL #3   Title demo Rt shoulder strength =/> 5-/5 without pain ( 02/19/15)    Time 3   Period Weeks   Status New   PT LONG TERM GOAL #4   Title reach overhead to climb ladder without shoulder pain ( 02/19/15)   Time 3   Period Weeks   Status New   PT LONG TERM GOAL #5   Title improve FOTO =/< 31% impaired ( 02/19/15)    Time 3   Period Weeks   Status New               Plan - 02/04/15 1238    Clinical Impression Statement Pt continues with limited and painful Rt shoulder ROM.  Pt very point tender to touch in Rt  shoulder girdle muscles; reduced during manual therapy.  Pt completed full sets of exercises, though later reported pain increased with each repetition. Pt encouraged to stay within painfree range during exercises.  Pt's pain decreased with use of estim and Vaso. No goals met; only 2nd visit.    Pt will benefit from skilled therapeutic intervention in order to improve on the following deficits Pain;Postural dysfunction;Decreased strength;Impaired UE functional use;Decreased range of motion   Rehab Potential Good   PT Frequency 1x / week   PT Duration 4 weeks   PT Treatment/Interventions Moist Heat;Ultrasound;Therapeutic exercise;Vasopneumatic Device;Manual techniques;Cryotherapy;Electrical Stimulation;Iontophoresis 3m/ml Dexamethasone;Patient/family education   PT Next Visit Plan progress postural ther ex and shoulders as tolerated. manual therapy and modalities as needed.    Consulted and Agree with Plan of Care Patient        Problem List Patient Active Problem List   Diagnosis Date Noted  . Obesity 01/23/2015  . Right shoulder pain 01/23/2015  . Smoker 08/28/2014  . Nephrolithiasis 07/25/2014  . Diabetes mellitus, type 2 07/25/2014  . Essential hypertension, benign 07/25/2014  . Annual physical exam 07/25/2014  . Hyperlipidemia 07/25/2014   JKerin Perna PTA 02/04/2015 12:48 PM  [place] [address]Phone: [phone]  Fax:  [phone]

## 2015-02-10 ENCOUNTER — Other Ambulatory Visit: Payer: Self-pay | Admitting: Sports Medicine

## 2015-02-11 ENCOUNTER — Ambulatory Visit (INDEPENDENT_AMBULATORY_CARE_PROVIDER_SITE_OTHER): Payer: Managed Care, Other (non HMO) | Admitting: Physical Therapy

## 2015-02-11 DIAGNOSIS — M25511 Pain in right shoulder: Secondary | ICD-10-CM | POA: Diagnosis not present

## 2015-02-11 DIAGNOSIS — R29898 Other symptoms and signs involving the musculoskeletal system: Secondary | ICD-10-CM | POA: Diagnosis not present

## 2015-02-11 DIAGNOSIS — R293 Abnormal posture: Secondary | ICD-10-CM

## 2015-02-11 NOTE — Patient Instructions (Signed)
Over Head Pull: Narrow Grip     [place] [phone]   On back, knees bent, feet flat, band across thighs, elbows straight but relaxed. Pull hands apart (start). Keeping elbows straight, bring arms up and over head, hands toward floor. Keep pull steady on band. Hold momentarily. Return slowly, keeping pull steady, back to start. Repeat _10__ times. Band color __Red____   Side Pull: Double Arm   On back, knees bent, feet flat. Arms perpendicular to body, shoulder level, elbows straight but relaxed. Pull arms out to sides, elbows straight. Resistance band comes across collarbones, hands toward floor. Hold momentarily. Slowly return to starting position. Repeat _10__ times. Band color __Red___   Robert Hines   On back, knees bent, feet flat, left hand on left hip, right hand above left. Pull right arm DIAGONALLY (hip to shoulder) across chest. Bring right arm along head toward floor. Hold momentarily. Slowly return to starting position.  (THUMB UP) Repeat _10__ times. Do with left arm. Band color __yellow____ - if too easy, progress to red band.   Shoulder Rotation: Double Arm   On back, knees bent, feet flat, elbows tucked at sides, bent 90, hands palms up. Pull hands apart and down toward floor, keeping elbows near sides. Hold momentarily. Slowly return to starting position. Repeat _10__ times, 2 sets. Band color __red ____   Scapula Adduction With Pectorals, Low   Stand in doorframe with palms against frame and arms at 45. Lean forward and squeeze shoulder blades. Hold _30__ seconds. Repeat _2__ times per session. Do _1-2__ sessions per day. Also perform with hands down low x 30 seconds.  You should not feel pain, just a stretch across chest.

## 2015-02-11 NOTE — Therapy (Addendum)
[place], [place], [address] Phone: [phone]   Fax:  [phone]  Physical Therapy Treatment  Patient Details  Name: Robert Hines MRN: [id number] Date of Birth: Jan 17, 1956 Referring Provider:  Silverio Decamp,*  Encounter Date: 02/11/2015      PT End of Session - 02/11/15 0705    Visit Number 3   Number of Visits 4   Date for PT Re-Evaluation 02/19/15   PT Start Time 0702   PT Stop Time 0758   PT Time Calculation (min) 56 min   Activity Tolerance Patient tolerated treatment well      Past Medical History  Diagnosis Date  . Hyperlipidemia   . Hypertension   . Diabetes mellitus without complication   . Renal disorder     calculi  . Spinal pain   . Depression     Past Surgical History  Procedure Laterality Date  . Vasectomy Bilateral   . Hernia repair    . Lithotripsy    . Foot surgery    . Anterior fusion cervical spine      There were no vitals filed for this visit.  Visit Diagnosis:  Weakness of shoulder  Pain in joint, shoulder region, right  Abnormal posture      Subjective Assessment - 02/11/15 0705    Subjective Pt reports he feels like things are getting a little better. Pain is still waking him at night when he sleeps too long on the affected side (3/10). He noticed he's able to do things during the day without as much pain.    Currently in Pain? No/denies  (up to 4/10) end range flexion/abduction             [facility] PT Assessment - 02/11/15 0001    Assessment   Medical Diagnosis Rt shoulder impingement   Onset Date/Surgical Date 12/29/14   Hand Dominance Right   Next MD Visit 02/19/15   Prior Therapy not for this   AROM   Right Shoulder Flexion 125 Degrees  degrees, sitting. Pain at end range.142 in supine   Right Shoulder ABduction 121 Degrees  sitting, pain at end range   Right Shoulder Internal Rotation 70 Degrees  supine, abd to 90 deg    Right Shoulder External Rotation 80 Degrees  supine, abd to 90 deg            [facility] Adult PT Treatment/Exercise - 02/11/15 0001    Shoulder Exercises: Supine   Horizontal ABduction Strengthening;Both;10 reps;Theraband  2 sets, no pain    Theraband Level (Shoulder Horizontal ABduction) Level 2 (Red)   External Rotation Strengthening;Both;10 reps;Theraband  2 sets   Theraband Level (Shoulder External Rotation) Level 2 (Red)   Flexion Strengthening;Both;10 reps;Theraband   Theraband Level (Shoulder Flexion) Level 2 (Red)   Other Supine Exercises Sash, yellow band x 10, red band x 10 reps    Shoulder Exercises: Standing   Other Standing Exercises Ladder climbing x 2 reps, no pain. Pt to try at work (vertical ladder)    Shoulder Exercises: Pulleys   Flexion 2 minutes   Shoulder Exercises: ROM/Strengthening   UBE (Upper Arm Bike) L2: 3 min forward, 1 min backwards    Shoulder Exercises: Stretch   Other Shoulder Stretches Doorway stretch: low and mid level x 30 sec each    Modalities   Modalities Vasopneumatic;Electrical Stimulation  to reduce exercise-induced pain.    Acupuncturist Location Rt shoulder  Electrical Stimulation Action IFC   Electrical Stimulation Parameters to tolerance x 15 min    Electrical Stimulation Goals Pain   Vasopneumatic   Number Minutes Vasopneumatic  15 minutes   Vasopnuematic Location  Shoulder  Rt   Vasopneumatic Pressure Medium   Vasopneumatic Temperature  3*                PT Education - 02/11/15 0735    Education provided Yes   Education Details HEP   Person(s) Educated Patient   Methods Demonstration;Explanation;Handout   Comprehension Returned demonstration;Verbalized understanding             PT Long Term Goals - 02/11/15 0737    PT LONG TERM GOAL #1   Title I with advanced HEP ( 02/19/15)   Time 3   Period Weeks   Status On-going   PT LONG TERM GOAL #2   Title demo painfree Rt  shoulder ROM in all directions ( 02/19/15)    Time 3   Period Weeks   Status On-going   PT LONG TERM GOAL #3   Title demo Rt shoulder strength =/> 5-/5 without pain ( 02/19/15)    Time 3   Period Weeks   Status On-going   PT LONG TERM GOAL #4   Title reach overhead to climb ladder without shoulder pain ( 02/19/15)   Time 3   Period Weeks   Status On-going   PT LONG TERM GOAL #5   Title improve FOTO =/< 31% impaired ( 02/19/15)    Time 3   Period Weeks   Status On-going               Plan - 02/11/15 0211    Clinical Impression Statement Pt progressing with improved Rt shoulder ROM, however still limited and painful at end range.  Pt tolerated increased resistance with exercises this date.  Progressing well towards goals.    Pt will benefit from skilled therapeutic intervention in order to improve on the following deficits Pain;Postural dysfunction;Decreased strength;Impaired UE functional use;Decreased range of motion   Rehab Potential Good   PT Frequency 1x / week   PT Duration 4 weeks   PT Treatment/Interventions Moist Heat;Ultrasound;Therapeutic exercise;Vasopneumatic Device;Manual techniques;Cryotherapy;Electrical Stimulation;Iontophoresis 80m/ml Dexamethasone;Patient/family education   PT Next Visit Plan progress postural ther ex and shoulders as tolerated. manual therapy and modalities as needed. Assess need for additional therapy visits.    PT Home Exercise Plan Pt to attempt ladder climbing prior to next visit.    Consulted and Agree with Plan of Care Patient        Problem List Patient Active Problem List   Diagnosis Date Noted  . Obesity 01/23/2015  . Right shoulder pain 01/23/2015  . Smoker 08/28/2014  . Nephrolithiasis 07/25/2014  . Diabetes mellitus, type 2 07/25/2014  . Essential hypertension, benign 07/25/2014  . Annual physical exam 07/25/2014  . Hyperlipidemia 07/25/2014   JKerin Perna PTA 02/11/2015 8:04 AM  [place] [address]Phone: [phone]  Fax:  [phone]    PHYSICAL THERAPY DISCHARGE SUMMARY  Visits from Start of Care: 3  Current functional level related to goals / functional outcomes: unknown   Remaining deficits: unknown   Education / Equipment: Initial HEL Plan:  Patient goals were not met. Patient is being discharged due to not returning since the last visit. Pt returned to MD and had an injection. ?????        Jeral Pinch, PT 03/17/2015 9:10 AM

## 2015-02-18 ENCOUNTER — Encounter: Payer: Managed Care, Other (non HMO) | Admitting: Physical Therapy

## 2015-02-20 ENCOUNTER — Ambulatory Visit (INDEPENDENT_AMBULATORY_CARE_PROVIDER_SITE_OTHER): Payer: Managed Care, Other (non HMO) | Admitting: Sports Medicine

## 2015-02-20 ENCOUNTER — Encounter: Payer: Self-pay | Admitting: Sports Medicine

## 2015-02-20 VITALS — BP 147/80 | HR 77 | Ht 73.0 in | Wt 235.0 lb

## 2015-02-20 DIAGNOSIS — M25511 Pain in right shoulder: Secondary | ICD-10-CM

## 2015-02-20 DIAGNOSIS — E119 Type 2 diabetes mellitus without complications: Secondary | ICD-10-CM | POA: Diagnosis not present

## 2015-02-20 DIAGNOSIS — E669 Obesity, unspecified: Secondary | ICD-10-CM | POA: Diagnosis not present

## 2015-02-20 MED ORDER — PHENTERMINE HCL 37.5 MG PO TABS: ORAL_TABLET | ORAL | Status: DC

## 2015-02-20 MED ORDER — TOPIRAMATE 50 MG PO TABS: ORAL_TABLET | ORAL | Status: DC

## 2015-02-20 NOTE — Progress Notes (Signed)
  Subjective:    CC: Follow-up  HPI: Obesity: Minimal weight loss after the first month on phentermine. Very still does have some evening cravings.  Diabetes mellitus type 2: Currently on between 83-86 units of Toujeo.  Right shoulder pain: Impingement in nature, improved significantly with physical therapy but still has some pain with abduction, mild, amenable to treat interventionally today.  Past medical history, Surgical history, Family history not pertinant except as noted below, Social history, Allergies, and medications have been entered into the medical record, reviewed, and no changes needed.   Review of Systems: No fevers, chills, night sweats, weight loss, chest pain, or shortness of breath.   Objective:    General: Well Developed, well nourished, and in no acute distress.  Neuro: Alert and oriented x3, extra-ocular muscles intact, sensation grossly intact.  HEENT: Normocephalic, atraumatic, pupils equal round reactive to light, neck supple, no masses, no lymphadenopathy, thyroid nonpalpable.  Skin: Warm and dry, no rashes. Cardiac: Regular rate and rhythm, no murmurs rubs or gallops, no lower extremity edema.  Respiratory: Clear to auscultation bilaterally. Not using accessory muscles, speaking in full sentences.  Procedure: Real-time Ultrasound Guided Injection of right subacromial bursa Device: GE Logiq E  Verbal informed consent obtained.  Time-out conducted.  Noted no overlying erythema, induration, or other signs of local infection.  Skin prepped in a sterile fashion.  Local anesthesia: Topical Ethyl chloride.  With sterile technique and under real time ultrasound guidance:  Noted distended subacromial bursa, 1 mL kenalog 40, 3 mL lidocaine injected easily. Completed without difficulty  Pain immediately resolved suggesting accurate placement of the medication.  Advised to call if fevers/chills, erythema, induration, drainage, or persistent bleeding.  Images  permanently stored and available for review in the ultrasound unit.  Impression: Technically successful ultrasound guided injection.  Impression and Recommendations:    I spent 25 minutes with this patient, greater than 50% was face-to-face time counseling regarding the above diagnoses

## 2015-02-20 NOTE — Assessment & Plan Note (Signed)
Only minimal weight loss after the first month, refilling phentermine, we will switch to one half tab twice a day and I am going to add Topamax. Return in one month for a weight check and refills.

## 2015-02-20 NOTE — Assessment & Plan Note (Signed)
Classic right subacromial bursitis with a visibly distended subacromial bursa on ultrasound. Guided injection as above. Continue physical therapy, return in one month

## 2015-02-27 ENCOUNTER — Telehealth: Payer: Self-pay

## 2015-02-27 NOTE — Telephone Encounter (Signed)
patient daughterAlbin Felling) called stated that patient has been taking 84units of Toujeo every night and he has ran out of medication she also stated that his blood sugar has been over 100. Advised patient daughter of the original instructions and she stated that the pharmacy said patient needs a new script sent in to pharmacy. Please advise patient will be out of medication as of tomorrow. Robert Hines,CMA

## 2015-03-02 MED ORDER — INSULIN GLARGINE 300 UNIT/ML ~~LOC~~ SOPN: 84.0000 [IU] | PEN_INJECTOR | Freq: Every day | SUBCUTANEOUS | Status: DC

## 2015-03-02 NOTE — Telephone Encounter (Signed)
New rx sent in

## 2015-03-02 NOTE — Telephone Encounter (Signed)
PATIENT HAS BEEN ADVISED. Chin Wachter,CMA  

## 2015-03-03 ENCOUNTER — Ambulatory Visit (INDEPENDENT_AMBULATORY_CARE_PROVIDER_SITE_OTHER): Payer: Managed Care, Other (non HMO) | Admitting: Sports Medicine

## 2015-03-03 ENCOUNTER — Encounter: Payer: Self-pay | Admitting: Sports Medicine

## 2015-03-03 VITALS — BP 129/83 | HR 76 | Ht 73.0 in | Wt 227.0 lb

## 2015-03-03 DIAGNOSIS — L821 Other seborrheic keratosis: Secondary | ICD-10-CM

## 2015-03-03 DIAGNOSIS — E119 Type 2 diabetes mellitus without complications: Secondary | ICD-10-CM

## 2015-03-03 DIAGNOSIS — B351 Tinea unguium: Secondary | ICD-10-CM

## 2015-03-03 DIAGNOSIS — Z0000 Encounter for general adult medical examination without abnormal findings: Secondary | ICD-10-CM

## 2015-03-03 DIAGNOSIS — Z87891 Personal history of nicotine dependence: Secondary | ICD-10-CM

## 2015-03-03 DIAGNOSIS — L918 Other hypertrophic disorders of the skin: Secondary | ICD-10-CM | POA: Insufficient documentation

## 2015-03-03 DIAGNOSIS — M25511 Pain in right shoulder: Secondary | ICD-10-CM

## 2015-03-03 DIAGNOSIS — E669 Obesity, unspecified: Secondary | ICD-10-CM

## 2015-03-03 MED ORDER — GABAPENTIN 300 MG PO CAPS: ORAL_CAPSULE | ORAL | Status: DC

## 2015-03-03 MED ORDER — ZOLPIDEM TARTRATE 10 MG PO TABS: 10.0000 mg | ORAL_TABLET | Freq: Every evening | ORAL | Status: DC | PRN

## 2015-03-03 NOTE — Assessment & Plan Note (Signed)
Annual physical exam as above, colonoscopy in a couple of weeks.

## 2015-03-03 NOTE — Progress Notes (Signed)
  Subjective:    CC: Complete physical exam  HPI:  Annual physical: Will be performed today, colonoscopy is scheduled for one week from now.  Diabetes mellitus type 2: Overall doing well with insulin and XigDuo, he does occasionally changes dose of insulin depending on whether he plans to eat or not, this is acceptable.  Hypertension: Controlled  Subacromial bursitis: Resolved after injection at the previous visit  Obesity: Good weight loss after only 2 weeks on the phentermine.  Toenail discoloration: Right first, wonders what this is. Does not bother him.  Skin lesion: Right-sided inner elbow, would like me to take a look.  Past medical history, Surgical history, Family history not pertinant except as noted below, Social history, Allergies, and medications have been entered into the medical record, reviewed, and no changes needed.   Review of Systems: No headache, visual changes, nausea, vomiting, diarrhea, constipation, dizziness, abdominal pain, skin rash, fevers, chills, night sweats, swollen lymph nodes, weight loss, chest pain, body aches, joint swelling, muscle aches, shortness of breath, mood changes, visual or auditory hallucinations.  Objective:    General: Well Developed, well nourished, and in no acute distress.  Neuro: Alert and oriented x3, extra-ocular muscles intact, sensation grossly intact. Cranial nerves II through XII are intact, motor, sensory, and coordinative functions are all intact. HEENT: Normocephalic, atraumatic, pupils equal round reactive to light, neck supple, no masses, no lymphadenopathy, thyroid nonpalpable. Oropharynx, nasopharynx, external ear canals are unremarkable. Skin: Warm and dry, no rashes noted. There is a 1.5 cm well circumscribed, seborrheic keratosis on the right inner elbow. Cardiac: Regular rate and rhythm, no murmurs rubs or gallops.  Respiratory: Clear to auscultation bilaterally. Not using accessory muscles, speaking in full  sentences.  Abdominal: Soft, nontender, nondistended, positive bowel sounds, no masses, no organomegaly.  Musculoskeletal: Shoulder, elbow, wrist, hip, knee, ankle stable, and with full range of motion.  Procedure:  Cryodestruction of right low seborrheic keratosis Consent obtained and verified. Time-out conducted. Noted no overlying erythema, induration, or other signs of local infection. Completed without difficulty using Cryo-Gun. Advised to call if fevers/chills, erythema, induration, drainage, or persistent bleeding.  Impression and Recommendations:    The patient was counselled, risk factors were discussed, anticipatory guidance given.

## 2015-03-03 NOTE — Assessment & Plan Note (Signed)
Essentially asymptomatic and patient does not desire treatment.

## 2015-03-03 NOTE — Assessment & Plan Note (Signed)
Overall doing well with insulin and XigDuo. Not yet due for a A1c. Also losing good weight with phentermine.

## 2015-03-03 NOTE — Assessment & Plan Note (Signed)
Completely pain-free after subacromial injection at the last visit.

## 2015-03-03 NOTE — Assessment & Plan Note (Signed)
Has not smoked a cigarette in 5 months now.

## 2015-03-03 NOTE — Assessment & Plan Note (Signed)
Cryotherapy 1 right inner elbow

## 2015-03-03 NOTE — Assessment & Plan Note (Signed)
Good weight loss with phentermine. Was unable to tolerate Topamax.

## 2015-03-09 LAB — HM COLONOSCOPY

## 2015-03-23 ENCOUNTER — Ambulatory Visit (INDEPENDENT_AMBULATORY_CARE_PROVIDER_SITE_OTHER): Payer: Managed Care, Other (non HMO) | Admitting: Sports Medicine

## 2015-03-23 ENCOUNTER — Encounter: Payer: Self-pay | Admitting: Sports Medicine

## 2015-03-23 VITALS — BP 118/67 | HR 101 | Ht 73.0 in | Wt 229.0 lb

## 2015-03-23 DIAGNOSIS — E669 Obesity, unspecified: Secondary | ICD-10-CM

## 2015-03-23 DIAGNOSIS — L918 Other hypertrophic disorders of the skin: Secondary | ICD-10-CM

## 2015-03-23 DIAGNOSIS — E119 Type 2 diabetes mellitus without complications: Secondary | ICD-10-CM | POA: Diagnosis not present

## 2015-03-23 MED ORDER — PHENTERMINE HCL 37.5 MG PO TABS: ORAL_TABLET | ORAL | Status: DC

## 2015-03-23 MED ORDER — GABAPENTIN 300 MG PO CAPS: ORAL_CAPSULE | ORAL | Status: DC

## 2015-03-23 NOTE — Progress Notes (Signed)
  Subjective:    CC: Follow-up  HPI: Obesity: 8 pound weight loss since last month.  Seborrheic keratosis: Inside of the right arm, healing well.  Skin tag: Under the right side of his scrotum, desires cryotherapy.  Past medical history, Surgical history, Family history not pertinant except as noted below, Social history, Allergies, and medications have been entered into the medical record, reviewed, and no changes needed.   Review of Systems: No fevers, chills, night sweats, weight loss, chest pain, or shortness of breath.   Objective:    General: Well Developed, well nourished, and in no acute distress.  Neuro: Alert and oriented x3, extra-ocular muscles intact, sensation grossly intact.  HEENT: Normocephalic, atraumatic, pupils equal round reactive to light, neck supple, no masses, no lymphadenopathy, thyroid nonpalpable.  Skin: Warm and dry, no rashes. Right-sided seborrheic keratosis on the arm has formed an eschar, no signs of bacterial superinfection, right-sided scrotal skin tag present Cardiac: Regular rate and rhythm, no murmurs rubs or gallops, no lower extremity edema.  Respiratory: Clear to auscultation bilaterally. Not using accessory muscles, speaking in full sentences.  Procedure:  Cryodestruction of right-sided scrotal skin tag Consent obtained and verified. Time-out conducted. Noted no overlying erythema, induration, or other signs of local infection. Completed without difficulty using Cryo-Gun. Advised to call if fevers/chills, erythema, induration, drainage, or persistent bleeding.  Impression and Recommendations:   I spent 25 minutes with this patient, greater than 50% was face-to-face time counseling regarding the above diagnoses, time was separate from the time spent during the injection

## 2015-03-23 NOTE — Assessment & Plan Note (Signed)
Moderate weight loss after the second month of phentermine. Refilling phentermine, return in one month, we're entering the third month.

## 2015-03-23 NOTE — Assessment & Plan Note (Signed)
Cryotherapy as above in the groin to the right of the scrotum.

## 2015-04-23 ENCOUNTER — Encounter: Payer: Self-pay | Admitting: Sports Medicine

## 2015-04-23 ENCOUNTER — Ambulatory Visit (INDEPENDENT_AMBULATORY_CARE_PROVIDER_SITE_OTHER): Payer: Managed Care, Other (non HMO) | Admitting: Sports Medicine

## 2015-04-23 VITALS — BP 132/82 | HR 100 | Ht 73.0 in | Wt 228.0 lb

## 2015-04-23 DIAGNOSIS — E119 Type 2 diabetes mellitus without complications: Secondary | ICD-10-CM | POA: Diagnosis not present

## 2015-04-23 DIAGNOSIS — E669 Obesity, unspecified: Secondary | ICD-10-CM | POA: Diagnosis not present

## 2015-04-23 DIAGNOSIS — M25511 Pain in right shoulder: Secondary | ICD-10-CM

## 2015-04-23 MED ORDER — PHENTERMINE HCL 37.5 MG PO TABS: ORAL_TABLET | ORAL | Status: DC

## 2015-04-23 NOTE — Assessment & Plan Note (Signed)
1 pound weight loss, refilling phentermine, we will switch to one half tab twice a day and if insufficient weight loss we will switch to phendimetrazine.

## 2015-04-23 NOTE — Assessment & Plan Note (Signed)
Repeat right subacromial injection as above. Return to see me in one month, we will consider surgical intervention if no better.

## 2015-04-23 NOTE — Progress Notes (Signed)
  Subjective:    CC: follow-up  HPI: Obesity: 1 pound weight loss, still doing phentermine once per day, has not yet split to one half tab twice a day  Right subacromial bursitis: Fantastic response to injection 2 months ago with recurrence of pain today. Desires repeat early intervention. Pain is severe, persistent, localized over the deltoid and worse with overhead activities.  Past medical history, Surgical history, Family history not pertinant except as noted below, Social history, Allergies, and medications have been entered into the medical record, reviewed, and no changes needed.   Review of Systems: No fevers, chills, night sweats, weight loss, chest pain, or shortness of breath.   Objective:    General: Well Developed, well nourished, and in no acute distress.  Neuro: Alert and oriented x3, extra-ocular muscles intact, sensation grossly intact.  HEENT: Normocephalic, atraumatic, pupils equal round reactive to light, neck supple, no masses, no lymphadenopathy, thyroid nonpalpable.  Skin: Warm and dry, no rashes. Cardiac: Regular rate and rhythm, no murmurs rubs or gallops, no lower extremity edema.  Respiratory: Clear to auscultation bilaterally. Not using accessory muscles, speaking in full sentences. Right Shoulder: Inspection reveals no abnormalities, atrophy or asymmetry. Palpation is normal with no tenderness over AC joint or bicipital groove. ROM is full in all planes. Rotator cuff strength normal throughout. positive Neer and Hawkin's tests, empty can. Speeds and Yergason's tests normal. No labral pathology noted with negative Obrien's, negative crank, negative clunk, and good stability. Normal scapular function observed. No painful arc and no drop arm sign. No apprehension sign  Procedure: Real-time Ultrasound Guided Injection of right subacromial bursa Device: GE Logiq E  Verbal informed consent obtained.  Time-out conducted.  Noted no overlying erythema,  induration, or other signs of local infection.  Skin prepped in a sterile fashion.  Local anesthesia: Topical Ethyl chloride.  With sterile technique and under real time ultrasound guidance: using a 25-gauge needle and injected 2 mL lidocaine, 2 mL Marcaine, 1 mL kenalog 40  Completed without difficulty  Pain immediately resolved suggesting accurate placement of the medication.  Advised to call if fevers/chills, erythema, induration, drainage, or persistent bleeding.  Images permanently stored and available for review in the ultrasound unit.  Impression: Technically successful ultrasound guided injection.  Impression and Recommendations:

## 2015-05-21 ENCOUNTER — Encounter: Payer: Self-pay | Admitting: Sports Medicine

## 2015-05-21 ENCOUNTER — Ambulatory Visit (INDEPENDENT_AMBULATORY_CARE_PROVIDER_SITE_OTHER): Payer: Managed Care, Other (non HMO) | Admitting: Sports Medicine

## 2015-05-21 DIAGNOSIS — Z794 Long term (current) use of insulin: Secondary | ICD-10-CM

## 2015-05-21 DIAGNOSIS — E119 Type 2 diabetes mellitus without complications: Secondary | ICD-10-CM | POA: Diagnosis not present

## 2015-05-21 DIAGNOSIS — E669 Obesity, unspecified: Secondary | ICD-10-CM | POA: Diagnosis not present

## 2015-05-21 MED ORDER — DULAGLUTIDE 1.5 MG/0.5ML ~~LOC~~ SOAJ: SUBCUTANEOUS | Status: DC

## 2015-05-21 MED ORDER — PHENDIMETRAZINE TARTRATE 35 MG PO TABS: ORAL_TABLET | ORAL | Status: DC

## 2015-05-21 NOTE — Progress Notes (Signed)
  Subjective:    CC: Follow-up  HPI:  Obesity: Weight gain after the second month on phentermine, agrees to stop.  Past medical history, Surgical history, Family history not pertinant except as noted below, Social history, Allergies, and medications have been entered into the medical record, reviewed, and no changes needed.   Review of Systems: No fevers, chills, night sweats, weight loss, chest pain, or shortness of breath.   Objective:    General: Well Developed, well nourished, and in no acute distress.  Neuro: Alert and oriented x3, extra-ocular muscles intact, sensation grossly intact.  HEENT: Normocephalic, atraumatic, pupils equal round reactive to light, neck supple, no masses, no lymphadenopathy, thyroid nonpalpable.  Skin: Warm and dry, no rashes. Cardiac: Regular rate and rhythm, no murmurs rubs or gallops, no lower extremity edema.  Respiratory: Clear to auscultation bilaterally. Not using accessory muscles, speaking in full sentences.  Impression and Recommendations:    I spent 25 minutes with this patient, greater than 50% was face-to-face time counseling regarding the above diagnoses

## 2015-05-21 NOTE — Assessment & Plan Note (Signed)
Continue insulin, XigDuo. We are going to add Trulicity to help with weight loss however he will probably need to decrease his dose of Toujeo.

## 2015-05-21 NOTE — Assessment & Plan Note (Signed)
No weight loss after 2 months on phentermine, switching to phendimetrazine and restarting Trulicity.

## 2015-06-09 ENCOUNTER — Other Ambulatory Visit: Payer: Self-pay | Admitting: Sports Medicine

## 2015-06-25 ENCOUNTER — Ambulatory Visit (INDEPENDENT_AMBULATORY_CARE_PROVIDER_SITE_OTHER): Payer: Managed Care, Other (non HMO) | Admitting: Sports Medicine

## 2015-06-25 ENCOUNTER — Encounter: Payer: Self-pay | Admitting: Sports Medicine

## 2015-06-25 VITALS — BP 157/89 | HR 81 | Temp 98.0°F | Resp 18 | Wt 237.1 lb

## 2015-06-25 DIAGNOSIS — M25511 Pain in right shoulder: Secondary | ICD-10-CM | POA: Diagnosis not present

## 2015-06-25 DIAGNOSIS — E669 Obesity, unspecified: Secondary | ICD-10-CM | POA: Diagnosis not present

## 2015-06-25 DIAGNOSIS — E119 Type 2 diabetes mellitus without complications: Secondary | ICD-10-CM | POA: Diagnosis not present

## 2015-06-25 DIAGNOSIS — Z794 Long term (current) use of insulin: Secondary | ICD-10-CM

## 2015-06-25 MED ORDER — TRAMADOL HCL 50 MG PO TABS: ORAL_TABLET | ORAL | Status: DC

## 2015-06-25 NOTE — Assessment & Plan Note (Signed)
Discontinue phendimetrazine, continue other medications. He will have to work on weight loss himself, he gained weight on phendimetrazine and phentermine.

## 2015-06-25 NOTE — Assessment & Plan Note (Signed)
Continue current medications, return 2 months to recheck hemoglobin A1c

## 2015-06-25 NOTE — Progress Notes (Signed)
  Subjective:    CC: Weight check  HPI: Obesity: Robert Hines gained weight over the past month on phendimetrazine, he also did not lose weight on phentermine.  Diabetes mellitus type 2: Doing well on Toujeo, XigDuo, Trulicity.  He does know that his blood sugars have suffered over the holidays.  Right shoulder pain: Clinically represents subacromial bursitis, he has failed 2 subacromial injections but is not yet ready to consider surgical intervention.  Past medical history, Surgical history, Family history not pertinant except as noted below, Social history, Allergies, and medications have been entered into the medical record, reviewed, and no changes needed.   Review of Systems: No fevers, chills, night sweats, weight loss, chest pain, or shortness of breath.   Objective:    General: Well Developed, well nourished, and in no acute distress.  Neuro: Alert and oriented x3, extra-ocular muscles intact, sensation grossly intact.  HEENT: Normocephalic, atraumatic, pupils equal round reactive to light, neck supple, no masses, no lymphadenopathy, thyroid nonpalpable.  Skin: Warm and dry, no rashes. Cardiac: Regular rate and rhythm, no murmurs rubs or gallops, no lower extremity edema.  Respiratory: Clear to auscultation bilaterally. Not using accessory muscles, speaking in full sentences. Right Shoulder: Inspection reveals no abnormalities, atrophy or asymmetry. Palpation is normal with no tenderness over AC joint or bicipital groove. ROM is full in all planes. Rotator cuff strength normal throughout. Strongly positive Neer and Hawkin's tests, empty can. Speeds and Yergason's tests normal. No labral pathology noted with negative Obrien's, negative crank, negative clunk, and good stability. Normal scapular function observed. No painful arc and no drop arm sign. No apprehension sign  Impression and Recommendations:

## 2015-06-25 NOTE — Assessment & Plan Note (Signed)
Unfortunately persistent impingement type pain despite 2 subacromial injections under ultrasound guidance, at this point he is a surgical candidate for subacromial decompressive surgery, he is not yet ready to consider this when going to give him a bit of tramadol to take a bedtime.

## 2015-06-26 ENCOUNTER — Ambulatory Visit: Payer: Managed Care, Other (non HMO) | Admitting: Sports Medicine

## 2015-08-04 ENCOUNTER — Encounter: Payer: Self-pay | Admitting: Sports Medicine

## 2015-08-04 ENCOUNTER — Ambulatory Visit (INDEPENDENT_AMBULATORY_CARE_PROVIDER_SITE_OTHER): Payer: Managed Care, Other (non HMO) | Admitting: Sports Medicine

## 2015-08-04 VITALS — BP 146/87 | HR 98 | Temp 98.4°F | Resp 18 | Wt 231.7 lb

## 2015-08-04 DIAGNOSIS — L03032 Cellulitis of left toe: Secondary | ICD-10-CM

## 2015-08-04 DIAGNOSIS — M25511 Pain in right shoulder: Secondary | ICD-10-CM | POA: Diagnosis not present

## 2015-08-04 MED ORDER — TRAMADOL HCL 50 MG PO TABS: ORAL_TABLET | ORAL | Status: DC

## 2015-08-04 MED ORDER — METRONIDAZOLE 500 MG PO TABS: 500.0000 mg | ORAL_TABLET | Freq: Two times a day (BID) | ORAL | Status: AC

## 2015-08-04 MED ORDER — DOXYCYCLINE HYCLATE 100 MG PO TABS: 100.0000 mg | ORAL_TABLET | Freq: Two times a day (BID) | ORAL | Status: AC

## 2015-08-04 MED ORDER — INSULIN PEN NEEDLE 32G X 8 MM MISC: Status: DC

## 2015-08-04 MED ORDER — CIPROFLOXACIN HCL 750 MG PO TABS
750.0000 mg | ORAL_TABLET | Freq: Two times a day (BID) | ORAL | Status: DC
Start: 2015-08-04 — End: 2015-08-28

## 2015-08-04 MED ORDER — INSULIN PEN NEEDLE 32G X 4 MM MISC: Status: DC

## 2015-08-04 NOTE — Progress Notes (Signed)
  Subjective:    CC: infected toe  HPI: For the past couple of weeks this pleasant 60 year old male with uncontrolled diabetes has had redness and drainage from his left great toe,symptoms are moderate, persistent.  Past medical history, Surgical history, Family history not pertinant except as noted below, Social history, Allergies, and medications have been entered into the medical record, reviewed, and no changes needed.   Review of Systems: No fevers, chills, night sweats, weight loss, chest pain, or shortness of breath.   Objective:    General: Well Developed, well nourished, and in no acute distress.  Neuro: Alert and oriented x3, extra-ocular muscles intact, sensation grossly intact.  HEENT: Normocephalic, atraumatic, pupils equal round reactive to light, neck supple, no masses, no lymphadenopathy, thyroid nonpalpable.  Skin: Warm and dry, no rashes. Cardiac: Regular rate and rhythm, no murmurs rubs or gallops, no lower extremity edema.  Respiratory: Clear to auscultation bilaterally. Not using accessory muscles, speaking in full sentences. Left foot:  There appears to be an ingrown toenail at the medial nail fold of the great toe, there is also erythema, and a gold crust with mild discharge. No tenderness. Good dorsalis pedis and posterior tibial pulses.  Impression and Recommendations:

## 2015-08-04 NOTE — Assessment & Plan Note (Signed)
Below the waist infection in an uncontrolled diabetic will need for coverage for staph aureus, Pseudomonas, and anaerobes. Adding metronidazole, ciprofloxacin, doxycycline.  He does have an ingrown toenail which we will remove when his infection is cleared.

## 2015-08-17 ENCOUNTER — Ambulatory Visit (INDEPENDENT_AMBULATORY_CARE_PROVIDER_SITE_OTHER): Payer: Managed Care, Other (non HMO) | Admitting: Sports Medicine

## 2015-08-17 DIAGNOSIS — Z794 Long term (current) use of insulin: Secondary | ICD-10-CM | POA: Diagnosis not present

## 2015-08-17 DIAGNOSIS — M25511 Pain in right shoulder: Secondary | ICD-10-CM

## 2015-08-17 DIAGNOSIS — E119 Type 2 diabetes mellitus without complications: Secondary | ICD-10-CM | POA: Diagnosis not present

## 2015-08-17 DIAGNOSIS — L03032 Cellulitis of left toe: Secondary | ICD-10-CM

## 2015-08-17 MED ORDER — HYDROCODONE-ACETAMINOPHEN 5-325 MG PO TABS: 1.0000 | ORAL_TABLET | Freq: Three times a day (TID) | ORAL | Status: DC | PRN

## 2015-08-17 NOTE — Progress Notes (Signed)
  Subjective:    CC: Follow-up  HPI: Diabetes, hyperlipidemia, hypertension: Stable, due for recheck. Fasting today.  Ingrown toenail: Pain and erythema is now resolved after triple antibiotics, agreeable to proceed with medial nail plate excision with phenol treatment.  Past medical history, Surgical history, Family history not pertinant except as noted below, Social history, Allergies, and medications have been entered into the medical record, reviewed, and no changes needed.   Review of Systems: No fevers, chills, night sweats, weight loss, chest pain, or shortness of breath.   Objective:    General: Well Developed, well nourished, and in no acute distress.  Neuro: Alert and oriented x3, extra-ocular muscles intact, sensation grossly intact.  HEENT: Normocephalic, atraumatic, pupils equal round reactive to light, neck supple, no masses, no lymphadenopathy, thyroid nonpalpable.  Skin: Warm and dry, no rashes. Cardiac: Regular rate and rhythm, no murmurs rubs or gallops, no lower extremity edema.  Respiratory: Clear to auscultation bilaterally. Not using accessory muscles, speaking in full sentences.  Procedure:  Removal of left great medial toenail. Risks, benefits, alternatives explained to patient. Consent obtained. Time out conducted. Noted no overlying induration or erythema at site of injection. Toe cleaned with alcohol, then a total of 10cc lidocaine 1% infiltrated at adjacent webspaces at the location of the bifurcation of the common digital nerve to proper digital nerves.  Some lidocaine also infiltrated at hyponychium and under nail bed.  Adequate anesthesia ensured. Toe prepped and draped in a sterile fashion. Nail elevator used to separate nail plate from nail bed. Clippers used to cut toenail in a longitudinal fashion to proximal nail fold and matrix. Hemostat then used to separate nail fragment from surrounding structures. Nail bed and matrix treated. Minor bleeding  controlled with pressure and phenol. Antibiotic ointment applied. Toe dressed. Advised to return if increased redness, swelling, drainage, fevers, or chills.  Impression and Recommendations:

## 2015-08-17 NOTE — Patient Instructions (Signed)
Fingernail or Toenail Removal  Fingernail or toenail removal is a surgical procedure to take off a nail from your finger or your toe. You may need to have a fingernail or toenail removed if it has an abnormal shape (deformity) or if it is severely injured. A fingernail or toenail may also be removed due to a bacterial infection, a severe ingrown toenail, or a fungal infection that has failed treatment with antifungal medicines.  LET YOUR HEALTH CARE PROVIDER KNOW ABOUT:   Any allergies you have.   All medicines you are taking, including vitamins, herbs, eye drops, creams, and over-the-counter medicines.   Previous problems you or members of your family have had with the use of anesthetics.   Any blood disorders you have.   Previous surgeries you have had.   Any medical conditions you may have.  RISKS AND COMPLICATIONS  Generally, this is a safe procedure. However, problems may occur, including:   Pain.   Bleeding.   Infection.   Regrowth of a deformed nail.  BEFORE THE PROCEDURE   Ask your health care provider about changing or stopping your regular medicines. This is especially important if you are taking diabetes medicines or blood thinners.   Follow instructions from your health care provider about eating or drinking restrictions.   Plan to have someone take you home after the procedure.  PROCEDURE   An IV tube will be inserted into one of your veins.   You will be given one or more of the following:    A medicine that helps you relax (sedative).    A medicine that numbs the area (local anesthetic).   After your toe or finger is numb, your health care provider will insert a blunt instrument under your nail to lift it up.   In some cases, your health care provider may also make a cut (incision) in your nail.   After your nail is lifted away from your toe or finger, your health care provider will detach it from your nail bed.   A germ-killing bandage (antiseptic dressing) will be put on your toe  or finger.  The procedure may vary among health care providers and hospitals.  AFTER THE PROCEDURE   Your blood pressure, heart rate, breathing rate, and blood oxygen level will be monitored often until the medicines you were given have worn off.   It is common to have some pain after nail removal. You will be given pain medicine as needed.   You may be given a prescription for pain medicine and antibiotic medicine.   If you had a toenail removed, you will be given a surgical shoe to wear while you recover.   If you had a fingernail removed, you may be given a finger splint to wear while you recover.     This information is not intended to replace advice given to you by your health care provider. Make sure you discuss any questions you have with your health care provider.     Document Released: 03/05/2003 Document Revised: 10/21/2014 Document Reviewed: 06/04/2014  [employer]vier Interactive Patient Education 2016 [employer]

## 2015-08-17 NOTE — Assessment & Plan Note (Signed)
Infection has cleared with triple antibiotics, medial nail plate excision with phenol treatment, return to see me in 2 weeks for a wound check.

## 2015-08-17 NOTE — Assessment & Plan Note (Signed)
Persistent impingement type pain despite 2 subacromial injection under ultrasound guidance, at this point he is a surgical candidate for subacromial decompressive-type surgery, at this point he is ready to proceed with operative intervention. Referral to Dr. Ave Filter.

## 2015-08-17 NOTE — Assessment & Plan Note (Signed)
Continue XigDuo, Trulicity, Toujeo. Checking routine blood work.

## 2015-08-18 LAB — COMPREHENSIVE METABOLIC PANEL
ALT: 50 U/L — ABNORMAL HIGH (ref 9–46)
AST: 26 U/L (ref 10–35)
Alkaline Phosphatase: 54 U/L (ref 40–115)
BUN: 17 mg/dL (ref 7–25)
Glucose, Bld: 86 mg/dL (ref 65–99)
Potassium: 3.6 mmol/L (ref 3.5–5.3)
Sodium: 138 mmol/L (ref 135–146)

## 2015-08-18 LAB — COMPREHENSIVE METABOLIC PANEL WITH GFR
Albumin: 4.5 g/dL (ref 3.6–5.1)
CO2: 26 mmol/L (ref 20–31)
Calcium: 9.3 mg/dL (ref 8.6–10.3)
Chloride: 100 mmol/L (ref 98–110)
Creat: 1.02 mg/dL (ref 0.70–1.25)
Total Bilirubin: 0.6 mg/dL (ref 0.2–1.2)
Total Protein: 6.8 g/dL (ref 6.1–8.1)

## 2015-08-18 LAB — LIPID PANEL
Cholesterol: 182 mg/dL (ref 125–200)
HDL: 50 mg/dL (ref >=40)
LDL Cholesterol: 110 mg/dL (ref <130)
Total CHOL/HDL Ratio: 3.6 ratio (ref <=5.0)
Triglycerides: 110 mg/dL (ref <150)
VLDL: 22 mg/dL (ref <30)

## 2015-08-18 LAB — CBC
HCT: 47.7 % (ref 39.0–52.0)
Hemoglobin: 16.2 g/dL (ref 13.0–17.0)
MCH: 31.9 pg (ref 26.0–34.0)
MCHC: 34 g/dL (ref 30.0–36.0)
MCV: 93.9 fL (ref 78.0–100.0)
MPV: 9.9 fL (ref 8.6–12.4)
Platelets: 193 10*3/uL (ref 150–400)
RBC: 5.08 MIL/uL (ref 4.22–5.81)
RDW: 13.9 % (ref 11.5–15.5)
WBC: 7.5 10*3/uL (ref 4.0–10.5)

## 2015-08-18 LAB — HEMOGLOBIN A1C
Hgb A1c MFr Bld: 7.3 % — ABNORMAL HIGH (ref <5.7)
Mean Plasma Glucose: 163 mg/dL — ABNORMAL HIGH (ref <117)

## 2015-08-18 LAB — TSH: TSH: 2.34 m[IU]/L (ref 0.40–4.50)

## 2015-08-24 ENCOUNTER — Ambulatory Visit: Payer: Managed Care, Other (non HMO) | Admitting: Sports Medicine

## 2015-08-27 ENCOUNTER — Telehealth: Payer: Self-pay

## 2015-08-27 NOTE — Telephone Encounter (Signed)
Pt left VM stating his toe is still draining after having toenail removed. Pt is concerned and would like to know if he should be seen sooner. Please advise.

## 2015-08-28 ENCOUNTER — Ambulatory Visit (INDEPENDENT_AMBULATORY_CARE_PROVIDER_SITE_OTHER): Payer: Managed Care, Other (non HMO) | Admitting: Family Medicine

## 2015-08-28 ENCOUNTER — Encounter: Payer: Self-pay | Admitting: Family Medicine

## 2015-08-28 ENCOUNTER — Other Ambulatory Visit: Payer: Self-pay | Admitting: Sports Medicine

## 2015-08-28 VITALS — BP 137/83 | HR 98 | Wt 239.0 lb

## 2015-08-28 DIAGNOSIS — E11621 Type 2 diabetes mellitus with foot ulcer: Secondary | ICD-10-CM | POA: Diagnosis not present

## 2015-08-28 DIAGNOSIS — L97529 Non-pressure chronic ulcer of other part of left foot with unspecified severity: Secondary | ICD-10-CM | POA: Diagnosis not present

## 2015-08-28 MED ORDER — METRONIDAZOLE 500 MG PO TABS: 500.0000 mg | ORAL_TABLET | Freq: Two times a day (BID) | ORAL | Status: DC

## 2015-08-28 MED ORDER — CIPROFLOXACIN HCL 750 MG PO TABS
750.0000 mg | ORAL_TABLET | Freq: Two times a day (BID) | ORAL | Status: DC
Start: 2015-08-28 — End: 2015-09-08

## 2015-08-28 MED ORDER — DOXYCYCLINE HYCLATE 100 MG PO TABS: 100.0000 mg | ORAL_TABLET | Freq: Two times a day (BID) | ORAL | Status: DC

## 2015-08-28 NOTE — Progress Notes (Signed)
CC: Robert Hines is a 60 y.o. male is here for Nail Problem   Subjective: HPI:  Complains of throbbing and purulent discharge that began 2 days ago from the medial nail bed of his left great toe. Since been worsening on a daily basis. It's worse at the end of the day after he's been standing for long periods of time. Recent interventions have included Epsom salts soaking once a day. He is not feeling any sensation in his toes at all so he surprised that he can feel pain right now. He denies fevers, chills, swollen lymph nodes or joint pain.   Review Of Systems Outlined In HPI  Past Medical History  Diagnosis Date  . Hyperlipidemia   . Hypertension   . Diabetes mellitus without complication (HCC)   . Renal disorder     calculi  . Spinal pain   . Depression     Past Surgical History  Procedure Laterality Date  . Vasectomy Bilateral   . Hernia repair    . Lithotripsy    . Foot surgery    . Anterior fusion cervical spine     Family History  Problem Relation Age of Onset  . Hypertension Mother   . Heart failure Father   . Diabetes Father   . Hypertension Father   . Heart failure Sister   . Hypertension Sister     Social History   Social History  . Marital Status: Married    Spouse Name: N/A  . Number of Children: N/A  . Years of Education: N/A   Occupational History  . Not on file.   Social History Main Topics  . Smoking status: Former Smoker    Quit date: 10/01/2014  . Smokeless tobacco: Never Used  . Alcohol Use: 0.0 oz/week    0 Standard drinks or equivalent per week  . Drug Use: Not on file  . Sexual Activity: Not on file   Other Topics Concern  . Not on file   Social History Narrative     Objective: BP 137/83 mmHg  Pulse 98  Wt 239 lb (108.41 kg)  Vital signs reviewed. General: Alert and Oriented, No Acute Distress HEENT: Pupils equal, round, reactive to light. Conjunctivae clear.  External ears unremarkable.  Moist mucous membranes. Lungs:  Clear and comfortable work of breathing, speaking in full sentences without accessory muscle use. Cardiac: Regular rate and rhythm.  Neuro: CN II-XII grossly intact, gait normal. Extremities: No peripheral edema.  Strong peripheral pulses.  Mental Status: No depression, anxiety, nor agitation. Logical though process. Skin: Warm and dry. Medial surface of left great toe nailbed is moderately erythematous with erythema expanding 1-2 cm peripherally on the skin.  Assessment & Plan: [name] was seen today for nail problem.  Diagnoses and all orders for this visit:  Diabetic ulcer of left foot associated with type 2 diabetes mellitus (HCC) -     ciprofloxacin (CIPRO) 750 MG tablet; Take 1 tablet (750 mg total) by mouth 2 (two) times daily. -     metroNIDAZOLE (FLAGYL) 500 MG tablet; Take 1 tablet (500 mg total) by mouth 2 (two) times daily. -     doxycycline (VIBRA-TABS) 100 MG tablet; Take 1 tablet (100 mg total) by mouth 2 (two) times daily.   Diabetic foot ulceration: Start triple therapy that seemed to be successful in the recent past. Begin soaking up to 3 times a day for 10 minutes at a time in his Epsom salts baths. Follow-up on Monday with  already scheduled appointment if not improving.   No Follow-up on file.

## 2015-08-31 ENCOUNTER — Ambulatory Visit: Payer: Managed Care, Other (non HMO) | Admitting: Sports Medicine

## 2015-09-05 ENCOUNTER — Encounter: Payer: Self-pay | Admitting: Emergency Medicine

## 2015-09-05 ENCOUNTER — Emergency Department
Admission: EM | Admit: 2015-09-05 | Discharge: 2015-09-05 | Disposition: A | Discharge status: Home / Self Care | Payer: Managed Care, Other (non HMO) | Source: Home / Self Care | Attending: Family Medicine | Admitting: Family Medicine

## 2015-09-05 DIAGNOSIS — J101 Influenza due to other identified influenza virus with other respiratory manifestations: Secondary | ICD-10-CM

## 2015-09-05 LAB — POCT INFLUENZA A/B
Influenza A, POC: POSITIVE — AB
Influenza B, POC: NEGATIVE

## 2015-09-05 MED ORDER — BENZONATATE 200 MG PO CAPS: 200.0000 mg | ORAL_CAPSULE | Freq: Every day | ORAL | Status: DC

## 2015-09-05 MED ORDER — OSELTAMIVIR PHOSPHATE 75 MG PO CAPS: 75.0000 mg | ORAL_CAPSULE | Freq: Two times a day (BID) | ORAL | Status: DC

## 2015-09-05 NOTE — ED Provider Notes (Signed)
CSN: [id number]     Arrival date & time 09/05/15  1241 History   First MD Initiated Contact with Patient 09/05/15 1359     Chief Complaint  Patient presents with  . Fever      HPI Comments: Complains of 1 day history flu-like illness including myalgias, headache, fever/chills, fatigue, and cough.  Also has mild nasal congestion.  Cough is non-productive and somewhat worse at night.  No pleuritic pain or shortness of breath.   The history is provided by the patient.    Past Medical History  Diagnosis Date  . Hyperlipidemia   . Hypertension   . Diabetes mellitus without complication (HCC)   . Renal disorder     calculi  . Spinal pain   . Depression    Past Surgical History  Procedure Laterality Date  . Vasectomy Bilateral   . Hernia repair    . Lithotripsy    . Foot surgery    . Anterior fusion cervical spine     Family History  Problem Relation Age of Onset  . Hypertension Mother   . Heart failure Father   . Diabetes Father   . Hypertension Father   . Heart failure Sister   . Hypertension Sister    Social History  Substance Use Topics  . Smoking status: Former Smoker    Quit date: 10/01/2014  . Smokeless tobacco: Never Used  . Alcohol Use: 0.0 oz/week    0 Standard drinks or equivalent per week    Review of Systems No sore throat + cough No pleuritic pain No wheezing + nasal congestion + post-nasal drainage No sinus pain/pressure No itchy/red eyes No earache No hemoptysis No SOB + fever, + chills + nausea No vomiting No abdominal pain + diarrhea No urinary symptoms No skin rash + fatigue + myalgias + headache Used OTC meds without relief  Allergies  Codeine and Duloxetine  Home Medications   Prior to Admission medications   Medication Sig Start Date End Date Taking? Authorizing Provider  aspirin 81 MG chewable tablet Chew by mouth daily.    Historical Provider, MD  benzonatate (TESSALON) 200 MG capsule Take 1 capsule (200 mg total) by  mouth at bedtime. Take as needed for cough 09/05/15   Lattie HawStephen A Azani Brogdon, MD  buPROPion (WELLBUTRIN XL) 300 MG 24 hr tablet Take 1 tablet (300 mg total) by mouth daily. 10/23/14   Monica Bectonhomas J Thekkekandam, MD  ciprofloxacin (CIPRO) 750 MG tablet Take 1 tablet (750 mg total) by mouth 2 (two) times daily. 08/28/15   Laren BoomSean Hommel, DO  cyclobenzaprine (FLEXERIL) 10 MG tablet Take 10 mg by mouth 3 (three) times daily as needed for muscle spasms.    Historical Provider, MD  doxycycline (VIBRA-TABS) 100 MG tablet Take 1 tablet (100 mg total) by mouth 2 (two) times daily. 08/28/15   Laren BoomSean Hommel, DO  Dulaglutide (TRULICITY) 1.5 MG/0.5ML SOPN Inject 1.5 mg subcutaneous weekly 05/21/15   Monica Bectonhomas J Thekkekandam, MD  gabapentin (NEURONTIN) 300 MG capsule 3 caps in the morning, 4 caps in the evening. 03/23/15   Monica Bectonhomas J Thekkekandam, MD  HYDROcodone-acetaminophen (NORCO/VICODIN) 5-325 MG tablet Take 1 tablet by mouth every 8 (eight) hours as needed for moderate pain. 08/17/15   Monica Bectonhomas J Thekkekandam, MD  HYDROmorphone (DILAUDID) 4 MG tablet 1-2 tabs PO q6h prn pain. 07/25/14   Monica Bectonhomas J Thekkekandam, MD  Insulin Glargine (TOUJEO SOLOSTAR) 300 UNIT/ML SOPN Inject 84 Units into the skin daily. Increase by 2 units if morning blood sugar  is above 100. 03/02/15   Monica Becton, MD  Insulin Pen Needle 32G X 4 MM MISC Use as needed with toujeo pen 08/04/15   Monica Becton, MD  ketorolac (TORADOL) 10 MG tablet Take 1 tablet (10 mg total) by mouth every 8 (eight) hours as needed. 07/25/14   Monica Becton, MD  lisinopril-hydrochlorothiazide (PRINZIDE,ZESTORETIC) 20-25 MG tablet TAKE 1 TABLET BY MOUTH DAILY 06/09/15   Monica Becton, MD  meloxicam (MOBIC) 15 MG tablet Take 1 tablet (15 mg total) by mouth daily. 10/23/14   Monica Becton, MD  metroNIDAZOLE (FLAGYL) 500 MG tablet Take 1 tablet (500 mg total) by mouth 2 (two) times daily. 08/28/15   Laren Boom, DO  ondansetron (ZOFRAN-ODT) 8 MG disintegrating tablet   07/23/14   Historical Provider, MD  oseltamivir (TAMIFLU) 75 MG capsule Take 1 capsule (75 mg total) by mouth every 12 (twelve) hours. 09/05/15   Lattie Haw, MD  simvastatin (ZOCOR) 40 MG tablet Take 1 tablet (40 mg total) by mouth daily. Take 1 tablet daily 10/23/14   Monica Becton, MD  traMADol Janean Sark) 50 MG tablet 1-2 tabs by mouth at bedtime 08/04/15   Monica Becton, MD  XIGDUO XR 03-999 MG TB24 TAKE 1 TABLET EVERY DAY 08/31/15   Monica Becton, MD  zolpidem (AMBIEN) 10 MG tablet Take 1 tablet (10 mg total) by mouth at bedtime as needed for sleep. 03/03/15   Monica Becton, MD   Meds Ordered and Administered this Visit  Medications - No data to display  BP 102/69 mmHg  Pulse 116  Temp(Src) 98.8 F (37.1 C) (Oral)  Wt 230 lb (104.327 kg)  SpO2 96% No data found.   Physical Exam Nursing notes and Vital Signs reviewed. Appearance:  Patient appears stated age, and in no acute distress Eyes:  Pupils are equal, round, and reactive to light and accomodation.  Extraocular movement is intact.  Conjunctivae are not inflamed  Ears:  Canals normal.  Tympanic membranes normal.  Nose:  Mildly congested turbinates.  No sinus tenderness.   Pharynx:  Normal Neck:  Supple.  Tender enlarged posterior nodes are palpated bilaterally  Lungs:  Clear to auscultation.  Breath sounds are equal.  Moving air well. Heart:  Regular rate and rhythm without murmurs, rubs, or gallops.  Abdomen:  Nontender without masses or hepatosplenomegaly.  Bowel sounds are present.  No CVA or flank tenderness.  Extremities:  No edema.  Skin:  No rash present.   ED Course  Procedures none    Labs Reviewed  POCT INFLUENZA A/B - Abnormal; Notable for the following:    Influenza A, POC Positive (*)    All other components within normal limits     MDM   1. Influenza A    Begin Tamiflu.  Prescription written for Benzonatate [facility]) to take at bedtime for night-time cough.  Take plain  guaifenesin (  extended release tabs such as Mucinex) twice daily, with plenty of water, for cough and congestion.  Get adequate rest.   May use Afrin nasal spray (or generic oxymetazoline) twice daily for about 5 days and then discontinue.  Also recommend using saline nasal spray several times daily and saline nasal irrigation (AYR is a common brand).  Use Flonase nasal spray each morning after using Afrin nasal spray and saline nasal irrigation. Try warm salt water gargles for sore throat.  Stop all antihistamines for now, and other non-prescription cough/cold preparations. May take Tylenol for fever,  headache, etc.   Follow-up with family doctor if not improving about 6 days.     Lattie Haw, MD 09/09/15 [phone]

## 2015-09-05 NOTE — Discharge Instructions (Signed)
Take plain guaifenesin (1200mg  extended release tabs such as Mucinex) twice daily, with plenty of water, for cough and congestion.  Get adequate rest.   May use Afrin nasal spray (or generic oxymetazoline) twice daily for about 5 days and then discontinue.  Also recommend using saline nasal spray several times daily and saline nasal irrigation (AYR is a common brand).  Use Flonase nasal spray each morning after using Afrin nasal spray and saline nasal irrigation. Try warm salt water gargles for sore throat.  Stop all antihistamines for now, and other non-prescription cough/cold preparations. May take Tylenol for fever, headache, etc.   Follow-up with family doctor if not improving about 6 days.    Influenza, Adult Influenza ("the flu") is a viral infection of the respiratory tract. It occurs more often in winter months because people spend more time in close contact with one another. Influenza can make you feel very sick. Influenza easily spreads from person to person (contagious). CAUSES  Influenza is caused by a virus that infects the respiratory tract. You can catch the virus by breathing in droplets from an infected person's cough or sneeze. You can also catch the virus by touching something that was recently contaminated with the virus and then touching your mouth, nose, or eyes. RISKS AND COMPLICATIONS You may be at risk for a more severe case of influenza if you smoke cigarettes, have diabetes, have chronic heart disease (such as heart failure) or lung disease (such as asthma), or if you have a weakened immune system. Elderly people and pregnant women are also at risk for more serious infections. The most common problem of influenza is a lung infection (pneumonia). Sometimes, this problem can require emergency medical care and may be life threatening. SIGNS AND SYMPTOMS  Symptoms typically last 4 to 10 days and may include:  Fever.  Chills.  Headache, body aches, and muscle  aches.  Sore throat.  Chest discomfort and cough.  Poor appetite.  Weakness or feeling tired.  Dizziness.  Nausea or vomiting. DIAGNOSIS  Diagnosis of influenza is often made based on your history and a physical exam. A nose or throat swab test can be done to confirm the diagnosis. TREATMENT  In mild cases, influenza goes away on its own. Treatment is directed at relieving symptoms. For more severe cases, your health care provider may prescribe antiviral medicines to shorten the sickness. Antibiotic medicines are not effective because the infection is caused by a virus, not by bacteria. HOME CARE INSTRUCTIONS  Take medicines only as directed by your health care provider.  Use a cool mist humidifier to make breathing easier.  Get plenty of rest until your temperature returns to normal. This usually takes 3 to 4 days.  Drink enough fluid to keep your urine clear or pale yellow.  Cover yourmouth and nosewhen coughing or sneezing,and wash your handswellto prevent thevirusfrom spreading.  Stay homefromwork orschool untilthe fever is gonefor at least 431full day. PREVENTION  An annual influenza vaccination (flu shot) is the best way to avoid getting influenza. An annual flu shot is now routinely recommended for all adults in the [place] SEEK MEDICAL CARE IF:  You experiencechest pain, yourcough worsens,or you producemore mucus.  Youhave nausea,vomiting, ordiarrhea.  Your fever returns or gets worse. SEEK IMMEDIATE MEDICAL CARE IF:  You havetrouble breathing, you become short of breath,or your skin ornails becomebluish.  You have severe painor stiffnessin the neck.  You develop a sudden headache, or pain in the face or ear.  You have  nausea or vomiting that you cannot control. MAKE SURE YOU:   Understand these instructions.  Will watch your condition.  Will get help right away if you are not doing well or get worse.   This information is not  intended to replace advice given to you by your health care provider. Make sure you discuss any questions you have with your health care provider.   Document Released: 06/03/2000 Document Revised: 06/27/2014 Document Reviewed: 09/05/2011 Elsevier Interactive Patient Education [employer]

## 2015-09-05 NOTE — ED Notes (Signed)
Pt c/o fever, body aches and HA since yesterday.

## 2015-09-07 ENCOUNTER — Telehealth: Payer: Self-pay

## 2015-09-07 DIAGNOSIS — L03032 Cellulitis of left toe: Secondary | ICD-10-CM

## 2015-09-07 NOTE — Telephone Encounter (Signed)
Pt left VM stating he is still having drainage from ingrown toenail that was removed by you. Was seen recently by Dr. Ivan AnchorsHommel and antibiotic was extended. Would like to know what he should do next. Please advise.

## 2015-09-08 ENCOUNTER — Encounter: Payer: Self-pay | Admitting: Sports Medicine

## 2015-09-08 ENCOUNTER — Ambulatory Visit (INDEPENDENT_AMBULATORY_CARE_PROVIDER_SITE_OTHER): Payer: Managed Care, Other (non HMO) | Admitting: Sports Medicine

## 2015-09-08 VITALS — BP 123/82 | HR 86 | Resp 18 | Wt 232.9 lb

## 2015-09-08 DIAGNOSIS — L03032 Cellulitis of left toe: Secondary | ICD-10-CM

## 2015-09-08 MED ORDER — DOXYCYCLINE HYCLATE 100 MG PO TABS: 100.0000 mg | ORAL_TABLET | Freq: Two times a day (BID) | ORAL | Status: AC

## 2015-09-08 MED ORDER — METRONIDAZOLE 500 MG PO TABS: 500.0000 mg | ORAL_TABLET | Freq: Two times a day (BID) | ORAL | Status: AC

## 2015-09-08 MED ORDER — CIPROFLOXACIN HCL 750 MG PO TABS
750.0000 mg | ORAL_TABLET | Freq: Two times a day (BID) | ORAL | Status: DC
Start: 2015-09-08 — End: 2015-12-18

## 2015-09-08 NOTE — Telephone Encounter (Signed)
error 

## 2015-09-08 NOTE — Telephone Encounter (Signed)
Cipro, Flagyl, doxycycline sent in.

## 2015-09-08 NOTE — Progress Notes (Signed)
  Subjective:    CC: follow-up  HPI: Paronychia: Improved significantly however still has a small area of drainage and mild erythema, he just finished 3 days of triple coverage. There is no pain, and he has no constitutional symptoms, symptoms are improving.  Past medical history, Surgical history, Family history not pertinant except as noted below, Social history, Allergies, and medications have been entered into the medical record, reviewed, and no changes needed.   Review of Systems: No fevers, chills, night sweats, weight loss, chest pain, or shortness of breath.   Objective:    General: Well Developed, well nourished, and in no acute distress.  Neuro: Alert and oriented x3, extra-ocular muscles intact, sensation grossly intact.  HEENT: Normocephalic, atraumatic, pupils equal round reactive to light, neck supple, no masses, no lymphadenopathy, thyroid nonpalpable.  Skin: Warm and dry, no rashes. Cardiac: Regular rate and rhythm, no murmurs rubs or gallops, no lower extremity edema.  Respiratory: Clear to auscultation bilaterally. Not using accessory muscles, speaking in full sentences. Left foot: Only minimal visible erythema without tenderness, minimal drainage, no palpable fluctuance.  Impression and Recommendations:

## 2015-09-08 NOTE — Assessment & Plan Note (Signed)
He will be 7 more days of antibiotics. It does appear to be closing up and there is only minimal drainage.

## 2015-09-15 ENCOUNTER — Other Ambulatory Visit: Payer: Self-pay | Admitting: Sports Medicine

## 2015-11-14 ENCOUNTER — Other Ambulatory Visit: Payer: Self-pay | Admitting: Sports Medicine

## 2015-11-17 ENCOUNTER — Other Ambulatory Visit: Payer: Self-pay

## 2015-11-17 MED ORDER — ZOLPIDEM TARTRATE 10 MG PO TABS: 10.0000 mg | ORAL_TABLET | Freq: Every evening | ORAL | Status: DC | PRN

## 2015-12-07 ENCOUNTER — Other Ambulatory Visit: Payer: Self-pay | Admitting: Orthopedic Surgery

## 2015-12-18 ENCOUNTER — Encounter (HOSPITAL_BASED_OUTPATIENT_CLINIC_OR_DEPARTMENT_OTHER): Payer: Self-pay | Admitting: *Deleted

## 2015-12-18 NOTE — Progress Notes (Signed)
   12/18/15 1404  OBSTRUCTIVE SLEEP APNEA  Have you ever been diagnosed with sleep apnea through a sleep study? No  Do you snore loudly (loud enough to be heard through closed doors)?  1  Do you often feel tired, fatigued, or sleepy during the daytime (such as falling asleep during driving or talking to someone)? 0  Has anyone observed you stop breathing during your sleep? 1  Do you have, or are you being treated for high blood pressure? 1  BMI more than 35 kg/m2? 0  Age > 50 (1-yes) 1  Neck circumference greater than:Male 16 inches or larger, Male 17inches or larger? 0  Male Gender (Yes=1) 1  Obstructive Sleep Apnea Score 5

## 2015-12-21 ENCOUNTER — Other Ambulatory Visit: Payer: Self-pay

## 2015-12-21 ENCOUNTER — Encounter (HOSPITAL_BASED_OUTPATIENT_CLINIC_OR_DEPARTMENT_OTHER)
Admission: RE | Admit: 2015-12-21 | Discharge: 2015-12-21 | Disposition: A | Discharge status: Home / Self Care | Payer: Managed Care, Other (non HMO) | Source: Ambulatory Visit | Attending: Orthopedic Surgery | Admitting: Orthopedic Surgery

## 2015-12-21 DIAGNOSIS — Z79899 Other long term (current) drug therapy: Secondary | ICD-10-CM | POA: Diagnosis not present

## 2015-12-21 DIAGNOSIS — Z791 Long term (current) use of non-steroidal anti-inflammatories (NSAID): Secondary | ICD-10-CM | POA: Diagnosis not present

## 2015-12-21 DIAGNOSIS — Z87891 Personal history of nicotine dependence: Secondary | ICD-10-CM | POA: Diagnosis not present

## 2015-12-21 DIAGNOSIS — M24111 Other articular cartilage disorders, right shoulder: Secondary | ICD-10-CM | POA: Diagnosis not present

## 2015-12-21 DIAGNOSIS — E119 Type 2 diabetes mellitus without complications: Secondary | ICD-10-CM | POA: Diagnosis not present

## 2015-12-21 DIAGNOSIS — Z7982 Long term (current) use of aspirin: Secondary | ICD-10-CM | POA: Diagnosis not present

## 2015-12-21 DIAGNOSIS — F329 Major depressive disorder, single episode, unspecified: Secondary | ICD-10-CM | POA: Diagnosis not present

## 2015-12-21 DIAGNOSIS — I10 Essential (primary) hypertension: Secondary | ICD-10-CM | POA: Diagnosis not present

## 2015-12-21 DIAGNOSIS — Z794 Long term (current) use of insulin: Secondary | ICD-10-CM | POA: Diagnosis not present

## 2015-12-21 DIAGNOSIS — M75111 Incomplete rotator cuff tear or rupture of right shoulder, not specified as traumatic: Secondary | ICD-10-CM | POA: Diagnosis present

## 2015-12-21 DIAGNOSIS — E785 Hyperlipidemia, unspecified: Secondary | ICD-10-CM | POA: Diagnosis not present

## 2015-12-21 LAB — BASIC METABOLIC PANEL
Anion gap: 9 (ref 5–15)
BUN: 31 mg/dL — ABNORMAL HIGH (ref 6–20)
CHLORIDE: 101 mmol/L (ref 101–111)
CO2: 26 mmol/L (ref 22–32)
CREATININE: 1.08 mg/dL (ref 0.61–1.24)
Calcium: 9.4 mg/dL (ref 8.9–10.3)
Glucose, Bld: 127 mg/dL — ABNORMAL HIGH (ref 65–99)
POTASSIUM: 4.7 mmol/L (ref 3.5–5.1)
SODIUM: 136 mmol/L (ref 135–145)

## 2015-12-28 ENCOUNTER — Encounter (HOSPITAL_BASED_OUTPATIENT_CLINIC_OR_DEPARTMENT_OTHER)
Admission: RE | Disposition: A | Discharge status: Home / Self Care | Payer: Self-pay | Source: Ambulatory Visit | Attending: Orthopedic Surgery

## 2015-12-28 ENCOUNTER — Encounter (HOSPITAL_BASED_OUTPATIENT_CLINIC_OR_DEPARTMENT_OTHER): Payer: Self-pay | Admitting: Anesthesiology

## 2015-12-28 ENCOUNTER — Ambulatory Visit (HOSPITAL_BASED_OUTPATIENT_CLINIC_OR_DEPARTMENT_OTHER): Payer: Managed Care, Other (non HMO) | Admitting: Anesthesiology

## 2015-12-28 ENCOUNTER — Ambulatory Visit (HOSPITAL_BASED_OUTPATIENT_CLINIC_OR_DEPARTMENT_OTHER)
Admission: RE | Admit: 2015-12-28 | Discharge: 2015-12-28 | Disposition: A | Discharge status: Home / Self Care | Payer: Managed Care, Other (non HMO) | Source: Ambulatory Visit | Attending: Orthopedic Surgery | Admitting: Orthopedic Surgery

## 2015-12-28 DIAGNOSIS — M24111 Other articular cartilage disorders, right shoulder: Secondary | ICD-10-CM | POA: Insufficient documentation

## 2015-12-28 DIAGNOSIS — Z791 Long term (current) use of non-steroidal anti-inflammatories (NSAID): Secondary | ICD-10-CM | POA: Insufficient documentation

## 2015-12-28 DIAGNOSIS — I10 Essential (primary) hypertension: Secondary | ICD-10-CM | POA: Insufficient documentation

## 2015-12-28 DIAGNOSIS — Z79899 Other long term (current) drug therapy: Secondary | ICD-10-CM | POA: Insufficient documentation

## 2015-12-28 DIAGNOSIS — F329 Major depressive disorder, single episode, unspecified: Secondary | ICD-10-CM | POA: Insufficient documentation

## 2015-12-28 DIAGNOSIS — Z794 Long term (current) use of insulin: Secondary | ICD-10-CM | POA: Insufficient documentation

## 2015-12-28 DIAGNOSIS — M75111 Incomplete rotator cuff tear or rupture of right shoulder, not specified as traumatic: Secondary | ICD-10-CM | POA: Diagnosis not present

## 2015-12-28 DIAGNOSIS — Z7982 Long term (current) use of aspirin: Secondary | ICD-10-CM | POA: Insufficient documentation

## 2015-12-28 DIAGNOSIS — Z87891 Personal history of nicotine dependence: Secondary | ICD-10-CM | POA: Insufficient documentation

## 2015-12-28 DIAGNOSIS — E785 Hyperlipidemia, unspecified: Secondary | ICD-10-CM | POA: Insufficient documentation

## 2015-12-28 DIAGNOSIS — E119 Type 2 diabetes mellitus without complications: Secondary | ICD-10-CM | POA: Insufficient documentation

## 2015-12-28 HISTORY — DX: Unspecified osteoarthritis, unspecified site: M19.90

## 2015-12-28 HISTORY — DX: Unspecified disorder of synovium and tendon, unspecified shoulder: M67.919

## 2015-12-28 HISTORY — PX: SHOULDER ARTHROSCOPY WITH ROTATOR CUFF REPAIR AND SUBACROMIAL DECOMPRESSION: SHX5686

## 2015-12-28 LAB — GLUCOSE, CAPILLARY
Glucose-Capillary: 89 mg/dL (ref 65–99)
Glucose-Capillary: 90 mg/dL (ref 65–99)

## 2015-12-28 SURGERY — SHOULDER ARTHROSCOPY WITH ROTATOR CUFF REPAIR AND SUBACROMIAL DECOMPRESSION
Anesthesia: Regional | Site: Shoulder | Laterality: Right

## 2015-12-28 MED ORDER — LACTATED RINGERS IV SOLN
INTRAVENOUS | Status: DC
  Administered 2015-12-28 (×2): via INTRAVENOUS

## 2015-12-28 MED ORDER — FENTANYL CITRATE (PF) 100 MCG/2ML IJ SOLN
50.0000 ug | INTRAMUSCULAR | Status: DC | PRN
  Administered 2015-12-28 (×2): 50 ug via INTRAVENOUS

## 2015-12-28 MED ORDER — FENTANYL CITRATE (PF) 100 MCG/2ML IJ SOLN
INTRAMUSCULAR | Status: AC
  Filled 2015-12-28: qty 2

## 2015-12-28 MED ORDER — LIDOCAINE HCL 4 % EX SOLN
CUTANEOUS | Status: DC | PRN
  Administered 2015-12-28: 3 mL via TOPICAL

## 2015-12-28 MED ORDER — PROPOFOL 10 MG/ML IV BOLUS
INTRAVENOUS | Status: DC | PRN
  Administered 2015-12-28: 180 mg via INTRAVENOUS

## 2015-12-28 MED ORDER — SODIUM CHLORIDE 0.9 % IR SOLN
Status: DC | PRN
  Administered 2015-12-28: 6000 mL

## 2015-12-28 MED ORDER — MIDAZOLAM HCL 2 MG/2ML IJ SOLN
1.0000 mg | INTRAMUSCULAR | Status: DC | PRN
  Administered 2015-12-28: 2 mg via INTRAVENOUS

## 2015-12-28 MED ORDER — LIDOCAINE 2% (20 MG/ML) 5 ML SYRINGE
INTRAMUSCULAR | Status: AC
  Filled 2015-12-28: qty 10

## 2015-12-28 MED ORDER — ONDANSETRON HCL 4 MG/2ML IJ SOLN
INTRAMUSCULAR | Status: DC | PRN
  Administered 2015-12-28: 4 mg via INTRAVENOUS

## 2015-12-28 MED ORDER — BUPIVACAINE-EPINEPHRINE (PF) 0.5% -1:200000 IJ SOLN
INTRAMUSCULAR | Status: DC | PRN
  Administered 2015-12-28: 30 mL via PERINEURAL

## 2015-12-28 MED ORDER — ONDANSETRON HCL 4 MG/2ML IJ SOLN
INTRAMUSCULAR | Status: AC
  Filled 2015-12-28: qty 2

## 2015-12-28 MED ORDER — MIDAZOLAM HCL 2 MG/2ML IJ SOLN
INTRAMUSCULAR | Status: AC
  Filled 2015-12-28: qty 2

## 2015-12-28 MED ORDER — PROPOFOL 10 MG/ML IV BOLUS
INTRAVENOUS | Status: AC
  Filled 2015-12-28: qty 40

## 2015-12-28 MED ORDER — OXYCODONE-ACETAMINOPHEN 5-325 MG PO TABS: 1.0000 | ORAL_TABLET | ORAL | Status: DC | PRN

## 2015-12-28 MED ORDER — EPHEDRINE SULFATE 50 MG/ML IJ SOLN
INTRAMUSCULAR | Status: DC | PRN
  Administered 2015-12-28: 5 mg via INTRAVENOUS
  Administered 2015-12-28: 10 mg via INTRAVENOUS
  Administered 2015-12-28: 5 mg via INTRAVENOUS

## 2015-12-28 MED ORDER — GLYCOPYRROLATE 0.2 MG/ML IJ SOLN: 0.2000 mg | Freq: Once | INTRAMUSCULAR | Status: DC | PRN

## 2015-12-28 MED ORDER — FENTANYL CITRATE (PF) 100 MCG/2ML IJ SOLN: 25.0000 ug | INTRAMUSCULAR | Status: DC | PRN

## 2015-12-28 MED ORDER — CEFAZOLIN SODIUM-DEXTROSE 2-4 GM/100ML-% IV SOLN
2.0000 g | INTRAVENOUS | Status: AC
  Administered 2015-12-28: 2 g via INTRAVENOUS

## 2015-12-28 MED ORDER — DEXAMETHASONE SODIUM PHOSPHATE 10 MG/ML IJ SOLN
INTRAMUSCULAR | Status: AC
  Filled 2015-12-28: qty 1

## 2015-12-28 MED ORDER — SCOPOLAMINE 1 MG/3DAYS TD PT72: 1.0000 | MEDICATED_PATCH | Freq: Once | TRANSDERMAL | Status: DC | PRN

## 2015-12-28 MED ORDER — PROMETHAZINE HCL 25 MG/ML IJ SOLN: 6.2500 mg | INTRAMUSCULAR | Status: DC | PRN

## 2015-12-28 MED ORDER — CEFAZOLIN SODIUM-DEXTROSE 2-4 GM/100ML-% IV SOLN
INTRAVENOUS | Status: AC
  Filled 2015-12-28: qty 100

## 2015-12-28 MED ORDER — SUCCINYLCHOLINE CHLORIDE 20 MG/ML IJ SOLN
INTRAMUSCULAR | Status: DC | PRN
  Administered 2015-12-28: 80 mg via INTRAVENOUS

## 2015-12-28 MED ORDER — DEXAMETHASONE SODIUM PHOSPHATE 4 MG/ML IJ SOLN
INTRAMUSCULAR | Status: DC | PRN
  Administered 2015-12-28: 5 mg via INTRAVENOUS

## 2015-12-28 MED ORDER — PHENYLEPHRINE HCL 10 MG/ML IJ SOLN
INTRAMUSCULAR | Status: DC | PRN
  Administered 2015-12-28: 40 ug via INTRAVENOUS
  Administered 2015-12-28: 80 ug via INTRAVENOUS
  Administered 2015-12-28 (×2): 40 ug via INTRAVENOUS

## 2015-12-28 MED ORDER — DOCUSATE SODIUM 100 MG PO CAPS: 100.0000 mg | ORAL_CAPSULE | Freq: Three times a day (TID) | ORAL | Status: DC | PRN

## 2015-12-28 MED ORDER — POVIDONE-IODINE 7.5 % EX SOLN: Freq: Once | CUTANEOUS | Status: DC

## 2015-12-28 MED ORDER — PROPOFOL 10 MG/ML IV BOLUS
INTRAVENOUS | Status: AC
  Filled 2015-12-28: qty 20

## 2015-12-28 SURGICAL SUPPLY — 82 items
BENZOIN TINCTURE PRP APPL 2/3 (GAUZE/BANDAGES/DRESSINGS) IMPLANT
BLADE CLIPPER SURG (BLADE) IMPLANT
BLADE SURG 15 STRL LF DISP TIS (BLADE) IMPLANT
BLADE SURG 15 STRL SS (BLADE)
BUR OVAL 4.0 (BURR) ×4 IMPLANT
CANNULA 5.75X71 LONG (CANNULA) ×4 IMPLANT
CANNULA TWIST IN 8.25X7CM (CANNULA) ×4 IMPLANT
CHLORAPREP W/TINT 26ML (MISCELLANEOUS) ×4 IMPLANT
CLOSURE WOUND 1/2 X4 (GAUZE/BANDAGES/DRESSINGS)
DECANTER SPIKE VIAL GLASS SM (MISCELLANEOUS) IMPLANT
DRAPE IMP U-DRAPE 54X76 (DRAPES) ×4 IMPLANT
DRAPE INCISE IOBAN 66X45 STRL (DRAPES) ×4 IMPLANT
DRAPE STERI 35X30 U-POUCH (DRAPES) ×4 IMPLANT
DRAPE SURG 17X23 STRL (DRAPES) ×4 IMPLANT
DRAPE U-SHAPE 47X51 STRL (DRAPES) ×4 IMPLANT
DRAPE U-SHAPE 76X120 STRL (DRAPES) ×8 IMPLANT
DRSG PAD ABDOMINAL 8X10 ST (GAUZE/BANDAGES/DRESSINGS) ×4 IMPLANT
ELECT REM PT RETURN 9FT ADLT (ELECTROSURGICAL) ×4
ELECTRODE REM PT RTRN 9FT ADLT (ELECTROSURGICAL) ×2 IMPLANT
GAUZE SPONGE 4X4 12PLY STRL (GAUZE/BANDAGES/DRESSINGS) ×4 IMPLANT
GAUZE SPONGE 4X4 16PLY XRAY LF (GAUZE/BANDAGES/DRESSINGS) IMPLANT
GAUZE XEROFORM 1X8 LF (GAUZE/BANDAGES/DRESSINGS) ×4 IMPLANT
GLOVE BIO SURGEON STRL SZ7 (GLOVE) ×4 IMPLANT
GLOVE BIO SURGEON STRL SZ7.5 (GLOVE) ×4 IMPLANT
GLOVE BIOGEL PI IND STRL 7.0 (GLOVE) ×6 IMPLANT
GLOVE BIOGEL PI IND STRL 8 (GLOVE) ×4 IMPLANT
GLOVE BIOGEL PI INDICATOR 7.0 (GLOVE) ×6
GLOVE BIOGEL PI INDICATOR 8 (GLOVE) ×4
GLOVE ECLIPSE 6.5 STRL STRAW (GLOVE) ×4 IMPLANT
GOWN STRL REUS W/ TWL LRG LVL3 (GOWN DISPOSABLE) ×4 IMPLANT
GOWN STRL REUS W/ TWL XL LVL3 (GOWN DISPOSABLE) ×2 IMPLANT
GOWN STRL REUS W/TWL LRG LVL3 (GOWN DISPOSABLE) ×4
GOWN STRL REUS W/TWL XL LVL3 (GOWN DISPOSABLE) ×2
LASSO CRESCENT QUICKPASS (SUTURE) IMPLANT
LIQUID BAND (GAUZE/BANDAGES/DRESSINGS) IMPLANT
MANIFOLD NEPTUNE II (INSTRUMENTS) ×4 IMPLANT
NDL SUT 6 .5 CRC .975X.05 MAYO (NEEDLE) IMPLANT
NEEDLE 1/2 CIR CATGUT .05X1.09 (NEEDLE) IMPLANT
NEEDLE MAYO TAPER (NEEDLE)
NEEDLE SCORPION MULTI FIRE (NEEDLE) ×4 IMPLANT
NS IRRIG 1000ML POUR BTL (IV SOLUTION) IMPLANT
PACK ARTHROSCOPY DSU (CUSTOM PROCEDURE TRAY) ×4 IMPLANT
PACK BASIN DAY SURGERY FS (CUSTOM PROCEDURE TRAY) ×4 IMPLANT
PENCIL BUTTON HOLSTER BLD 10FT (ELECTRODE) IMPLANT
RESECTOR FULL RADIUS 4.2MM (BLADE) ×4 IMPLANT
SHEET MEDIUM DRAPE 40X70 STRL (DRAPES) IMPLANT
SLEEVE SCD COMPRESS KNEE MED (MISCELLANEOUS) ×4 IMPLANT
SLING ARM FOAM STRAP LRG (SOFTGOODS) IMPLANT
SLING ARM IMMOBILIZER MED (SOFTGOODS) IMPLANT
SLING ARM IMMOBILIZER XL (CAST SUPPLIES) ×4 IMPLANT
SLING ARM MED ADULT FOAM STRAP (SOFTGOODS) IMPLANT
SLING ARM XL FOAM STRAP (SOFTGOODS) IMPLANT
SPONGE LAP 4X18 X RAY DECT (DISPOSABLE) IMPLANT
STRIP CLOSURE SKIN 1/2X4 (GAUZE/BANDAGES/DRESSINGS) IMPLANT
SUCTION FRAZIER HANDLE 10FR (MISCELLANEOUS)
SUCTION TUBE FRAZIER 10FR DISP (MISCELLANEOUS) IMPLANT
SUPPORT WRAP ARM LG (MISCELLANEOUS) IMPLANT
SUT BONE WAX W31G (SUTURE) IMPLANT
SUT ETHIBOND 2 OS 4 DA (SUTURE) IMPLANT
SUT ETHILON 3 0 PS 1 (SUTURE) ×4 IMPLANT
SUT ETHILON 4 0 PS 2 18 (SUTURE) IMPLANT
SUT FIBERWIRE #2 38 T-5 BLUE (SUTURE) ×8
SUT MNCRL AB 3-0 PS2 18 (SUTURE) IMPLANT
SUT MNCRL AB 4-0 PS2 18 (SUTURE) IMPLANT
SUT PDS AB 0 CT 36 (SUTURE) IMPLANT
SUT PROLENE 3 0 PS 2 (SUTURE) IMPLANT
SUT TIGER TAPE 7 IN WHITE (SUTURE) IMPLANT
SUT VIC AB 0 CT1 27 (SUTURE)
SUT VIC AB 0 CT1 27XBRD ANBCTR (SUTURE) IMPLANT
SUT VIC AB 2-0 SH 27 (SUTURE)
SUT VIC AB 2-0 SH 27XBRD (SUTURE) IMPLANT
SUTURE FIBERWR #2 38 T-5 BLUE (SUTURE) ×4 IMPLANT
SYR BULB 3OZ (MISCELLANEOUS) IMPLANT
TAPE FIBER 2MM 7IN #2 BLUE (SUTURE) IMPLANT
TOWEL OR 17X24 6PK STRL BLUE (TOWEL DISPOSABLE) ×4 IMPLANT
TOWEL OR NON WOVEN STRL DISP B (DISPOSABLE) ×4 IMPLANT
TUBE CONNECTING 20'X1/4 (TUBING) ×1
TUBE CONNECTING 20X1/4 (TUBING) ×3 IMPLANT
TUBING ARTHROSCOPY IRRIG 16FT (MISCELLANEOUS) ×4 IMPLANT
WAND STAR VAC 90 (SURGICAL WAND) ×4 IMPLANT
WATER STERILE IRR 1000ML POUR (IV SOLUTION) ×4 IMPLANT
YANKAUER SUCT BULB TIP NO VENT (SUCTIONS) IMPLANT

## 2015-12-28 NOTE — H&P (Signed)
Robert BertinGary E Hines is an 60 y.o. male.   Chief Complaint: Right shoulder pain HPI: Patient is a long history of right shoulder pain with high-grade partial rotator cuff tear which is failed conservative management with injections and physical therapy. He wishes to go forward with surgical management to try and decrease pain and restore function.  Past Medical History  Diagnosis Date  . Hyperlipidemia   . Hypertension   . Diabetes mellitus without complication (HCC)   . Spinal pain   . Depression   . Arthritis     back  . Renal disorder     calculi, multiple lithotripsies, stents  . Rotator cuff disorder     Past Surgical History  Procedure Laterality Date  . Vasectomy Bilateral   . Hernia repair    . Foot surgery    . Anterior fusion cervical spine    . Lithotripsy      Family History  Problem Relation Age of Onset  . Hypertension Mother   . Heart failure Father   . Diabetes Father   . Hypertension Father   . Heart failure Sister   . Hypertension Sister    Social History:  reports that he quit smoking about 14 months ago. He has never used smokeless tobacco. He reports that he drinks alcohol. His drug history is not on file.  Allergies:  Allergies  Allergen Reactions  . Codeine Nausea Only  . Duloxetine Diarrhea and Nausea Only    Medications Prior to Admission  Medication Sig Dispense Refill  . aspirin 81 MG chewable tablet Chew by mouth daily.    [name]. buPROPion (WELLBUTRIN XL) 300 MG 24 hr tablet TAKE 1 TABLET BY MOUTH DAILY 90 tablet 0  . gabapentin (NEURONTIN) 300 MG capsule 3 caps in the morning, 4 caps in the evening. 630 capsule 3  . Insulin Glargine (TOUJEO SOLOSTAR) 300 UNIT/ML SOPN Inject 84 Units into the skin daily. Increase by 2 units if morning blood sugar is above 100. 5 pen 11  . lisinopril-hydrochlorothiazide (PRINZIDE,ZESTORETIC) 20-25 MG tablet TAKE 1 TABLET BY MOUTH DAILY 90 tablet 0  . meloxicam (MOBIC) 15 MG tablet TAKE 1 TABLET BY MOUTH DAILY 90  tablet 0  . simvastatin (ZOCOR) 40 MG tablet TAKE 1 TABLET BY MOUTH DAILY 90 tablet 0  . traMADol (ULTRAM) 50 MG tablet 1-2 tabs by mouth at bedtime 40 tablet 0  . TRULICITY 1.5 MG/0.5ML SOPN INJECT 1.5MG  SUBCUTANEOUSLY WEEKLY. 2 pen 3  . XIGDUO XR 03-999 MG TB24 TAKE 1 TABLET EVERY DAY 30 tablet 9  . zolpidem (AMBIEN) 10 MG tablet Take 1 tablet (10 mg total) by mouth at bedtime as needed for sleep. 90 tablet 3  . cyclobenzaprine (FLEXERIL) 10 MG tablet Take 10 mg by mouth 3 (three) times daily as needed for muscle spasms.    [name]. HYDROcodone-acetaminophen (NORCO/VICODIN) 5-325 MG tablet Take 1 tablet by mouth every 8 (eight) hours as needed for moderate pain. 40 tablet 0  . HYDROmorphone (DILAUDID) 4 MG tablet 1-2 tabs PO q6h prn pain. 30 tablet 0  . Insulin Pen Needle 32G X 4 MM MISC Use as needed with toujeo pen 300 each 4    Results for orders placed or performed during the hospital encounter of 12/28/15 (from the past 48 hour(s))  Glucose, capillary     Status: None   Collection Time: 12/28/15 10:56 AM  Result Value Ref Range   Glucose-Capillary 89 65 - 99 mg/dL   No results found.  Review of  Systems  All other systems reviewed and are negative.   Blood pressure 130/79, pulse 93, temperature 97.8 F (36.6 C), temperature source Oral, resp. rate 11, height  (1.854 m), weight 106.255 kg (234 lb 4 oz), SpO2 99 %. Physical Exam  Constitutional: He is oriented to person, place, and time. He appears well-developed and well-nourished.  HENT:  Head: Atraumatic.  Eyes: EOM are normal.  Cardiovascular: Intact distal pulses.   Respiratory: Effort normal.  Musculoskeletal:  Right shoulder pain with rotator cuff testing. Distally neurovascularly intact.  Neurological: He is alert and oriented to person, place, and time.  Skin: Skin is warm and dry.  Psychiatric: He has a normal mood and affect.     Assessment/Plan Right shoulder high-grade partial rotator cuff tear Plan right  shoulder arthroscopic debridement versus repair rotator cuff with subacromial decompression Risks / benefits of surgery discussed Consent on chart  NPO for OR Preop antibiotics   Mable Paris, MD 12/28/2015, 11:35 AM

## 2015-12-28 NOTE — Transfer of Care (Signed)
Immediate Anesthesia Transfer of Care Note  Patient: Robert BertinGary E Hines  Procedure(s) Performed: Procedure(s) with comments: SHOULDER ARTHROSCOPY TAKE DOWN ROTATOR CUFF REPAIR AND SUBACROMIAL DECOMPRESSION, DEBRIDEMENT OF SLAP TEAR (Right) - SHOULDER ARTHROSCOPY TAKE DOWN ROTATOR CUFF REPAIR AND SUBACROMIAL DECOMPRESSION  Patient Location: PACU  Anesthesia Type:General and Regional  Level of Consciousness: awake, alert  and oriented  Airway & Oxygen Therapy: Patient Spontanous Breathing  Post-op Assessment: Report given to RN  Post vital signs: Reviewed  Last Vitals:  Filed Vitals:   12/28/15 1315 12/28/15 1330  BP: 87/61 118/73  Pulse: 81 94  Temp:    Resp: 12 19    Last Pain:  Filed Vitals:   12/28/15 1339  PainSc: Asleep         Complications: No apparent anesthesia complications

## 2015-12-28 NOTE — Op Note (Signed)
Procedure(s):  Procedure Note  Robert Hines male 60 y.o. 12/28/2015  Procedure(s) and Anesthesia Type:   #1 right shoulder arthroscopic rotator cuff repair #2 right shoulder arthroscopic subacromial decompression #3 right shoulder arthroscopic debridement type I SLAP tear  Surgeon(s) and Role:    * Jones BroomJustin Damin Salido, MD - Primary     Surgeon: Mable ParisHANDLER,Graciela Plato WILLIAM   Assistants: Damita Lackanielle Lalibert PA-C (Danielle was present and scrubbed throughout the procedure and was essential in positioning, assisting with the camera and instrumentation,, and closure)  Anesthesia: General endotracheal anesthesia with preoperative interscalene block given by the attending anesthesiologist    Procedure Detail    Estimated Blood Loss: Min         Drains: none  Blood Given: none         Specimens: none        Complications:  * No complications entered in OR log *         Disposition: PACU - hemodynamically stable.         Condition: stable    Procedure:   INDICATIONS FOR SURGERY: The patient is 60 y.o. male who has had a long history of right shoulder pain which has been refractory to nonoperative management. He has a component of scapular pain but also significant anterolateral pain which is worse with reaching. He was found to have a high-grade partial rotator cuff tear as well as unfavorable acromial anatomy. Ultimately is indicated for surgical treatment to try and decrease pain and restore function.  OPERATIVE FINDINGS: Examination under anesthesia: Mild stiffness at end ranges   DESCRIPTION OF PROCEDURE: The patient was identified in preoperative  holding area where I personally marked the operative site after  verifying site, side, and procedure with the patient. An interscalene block was given by the attending anesthesiologist the holding area.  The patient was taken back to the operating room where general anesthesia was induced without complication and was placed in  the beach-chair position with the back  elevated about 60 degrees and all extremities and head and neck carefully padded and  positioned.   The right upper extremity was then prepped and  draped in a standard sterile fashion. The appropriate time-out  procedure was carried out. The patient did receive IV antibiotics  within 30 minutes of incision.   A small posterior portal incision was made and the arthroscope was introduced into the joint. An anterior portal was then established above the subscapularis using needle localization. Small cannula was placed anteriorly. Diagnostic arthroscopy was then carried out .  He was noted to have intact subscapularis and infraspinatus. The undersurface of the supraspinatus had some mild partial thickness tearing and a small area which appeared to be near full-thickness just off the articular margin. This was debrided extensively. Glenn humeral joint surfaces were intact. The biceps tendon was intact, but there was some type I tearing of the superior labrum without significant detachment of the biceps origin. This was extensively debrided with a shaver back to healthy intact labrum. The biceps tendon was pulled into the joint and there was no significant Tina synovitis or partial tearing.  The arthroscope was then introduced into the subacromial space a standard lateral portal was established with needle localization. The shaver was used through the lateral portal to perform extensive bursectomy. Coracoacromial ligament was examined and found to be severely thickened and prominent causing impingement.  The bursal side of the rotator cuff was carefully examined. He had an interesting tear which was off of the  tuberosity insertion of the tendon by about 1.5 cm. It was a deep tear in the area of impingement and after a thorough debridement it was still not quite full thickness. There is no exposed tuberosity. This was at the tendon-tendon junction. There was good  tendon on both sides and I felt that bringing this together with sutures would be beneficial. Therefore a large cannula was placed laterally and the small cannula was moved anteriorly into the subacromial space. The repair was then carried out with 2 #2 fiber wires crossing the repair site in a simple fashion using a combination of a scorpion and [employer]. The sutures were tied down and closed the gap nicely.  The coracoacromial ligament was taken down off the anterior acromion with the ArthroCare exposing a large hooked anterior acromial spur. A high-speed bur was then used through the lateral portal to take down the anterior acromial spur from lateral to medial in a standard acromioplasty.  The acromioplasty was also viewed from the lateral portal and the bur was used as necessary to ensure that the acromion was completely flat from posterior to anterior.  The arthroscopic equipment was removed from the joint and the portals were closed with 3-0 nylon in an interrupted fashion. Sterile dressings were then applied including Xeroform 4 x 4's ABDs and tape. The patient was then allowed to awaken from general anesthesia, placed in a sling, transferred to the stretcher and taken to the recovery room in stable condition.   POSTOPERATIVE PLAN: The patient will be discharged home today and will followup in one week for suture removal and wound check.  We will get him in the standard cuff protocol.

## 2015-12-28 NOTE — Anesthesia Preprocedure Evaluation (Signed)
Anesthesia Evaluation  Patient identified by MRN, date of birth, ID band Patient awake    Reviewed: Allergy & Precautions, H&P , NPO status , Patient's Chart, lab work & pertinent test results  History of Anesthesia Complications Negative for: history of anesthetic complications  Airway Mallampati: II  TM Distance: >3 FB Neck ROM: full    Dental no notable dental hx.    Pulmonary former smoker,    Pulmonary exam normal breath sounds clear to auscultation       Cardiovascular hypertension, Normal cardiovascular exam Rhythm:regular Rate:Normal     Neuro/Psych PSYCHIATRIC DISORDERS Depression negative neurological ROS     GI/Hepatic negative GI ROS, Neg liver ROS,   Endo/Other  diabetes, Type 2, Insulin Dependent  Renal/GU negative Renal ROS     Musculoskeletal  (+) Arthritis ,   Abdominal   Peds  Hematology negative hematology ROS (+)   Anesthesia Other Findings   Reproductive/Obstetrics negative OB ROS                             Anesthesia Physical Anesthesia Plan  ASA: III  Anesthesia Plan: General and Regional   Post-op Pain Management: GA combined w/ Regional for post-op pain   Induction: Intravenous  Airway Management Planned: Oral ETT  Additional Equipment:   Intra-op Plan:   Post-operative Plan: Extubation in OR  Informed Consent: I have reviewed the patients History and Physical, chart, labs and discussed the procedure including the risks, benefits and alternatives for the proposed anesthesia with the patient or authorized representative who has indicated his/her understanding and acceptance.   Dental Advisory Given  Plan Discussed with: Anesthesiologist, CRNA and Surgeon  Anesthesia Plan Comments:         Anesthesia Quick Evaluation

## 2015-12-28 NOTE — Anesthesia Postprocedure Evaluation (Signed)
Anesthesia Post Note  Patient: Robert Hines  Procedure(s) Performed: Procedure(s) (LRB): SHOULDER ARTHROSCOPY TAKE DOWN ROTATOR CUFF REPAIR AND SUBACROMIAL DECOMPRESSION, DEBRIDEMENT OF SLAP TEAR (Right)  Patient location during evaluation: PACU Anesthesia Type: General and Regional Level of consciousness: awake and alert Pain management: pain level controlled Vital Signs Assessment: post-procedure vital signs reviewed and stable Respiratory status: spontaneous breathing, nonlabored ventilation, respiratory function stable and patient connected to nasal cannula oxygen Cardiovascular status: blood pressure returned to baseline and stable Postop Assessment: no signs of nausea or vomiting Anesthetic complications: no    Last Vitals:  Filed Vitals:   12/28/15 1303 12/28/15 1304  BP:  94/57  Pulse: 80 82  Temp:  36.4 C  Resp:  15    Last Pain:  Filed Vitals:   12/28/15 1322  PainSc: Asleep                 Reino KentJudd, Zaydee Aina J

## 2015-12-28 NOTE — Discharge Instructions (Signed)
Discharge Instructions after Arthroscopic Shoulder Repair ° ° °A sling has been provided for you. Remain in your sling at all times. This includes sleeping in your sling.  °Use ice on the shoulder intermittently over the first 48 hours after surgery.  °Pain medicine has been prescribed for you.  °Use your medicine liberally over the first 48 hours, and then you can begin to taper your use. You may take Extra Strength Tylenol or Tylenol only in place of the pain pills. DO NOT take ANY nonsteroidal anti-inflammatory pain medications: Advil, Motrin, Ibuprofen, Aleve, Naproxen, or Narprosyn.  °You may remove your dressing after two days. If the incision sites are still moist, place a Band-Aid over the moist site(s). Change Band-Aids daily until dry.  °You may shower 5 days after surgery. The incisions CANNOT get wet prior to 5 days. Simply allow the water to wash over the site and then pat dry. Do not rub the incisions. Make sure your axilla (armpit) is completely dry after showering.  °Take one aspirin a day for 2 weeks after surgery, unless you have an aspirin sensitivity/ allergy or asthma. ° ° °Please call [phone] during normal business hours or [phone] after hours for any problems. Including the following: ° °- excessive redness of the incisions °- drainage for more than 4 days °- fever of more than 101.5 F ° °*Please note that pain medications will not be refilled after hours or on weekends. ° °Regional Anesthesia Blocks ° °1. Numbness or the inability to move the "blocked" extremity may last from 3-48 hours after placement. The length of time depends on the medication injected and your individual response to the medication. If the numbness is not going away after 48 hours, call your surgeon. ° °2. The extremity that is blocked will need to be protected until the numbness is gone and the  Strength has returned. Because you cannot feel it, you will need to take extra care to avoid injury. Because it may  be weak, you may have difficulty moving it or using it. You may not know what position it is in without looking at it while the block is in effect. ° °3. For blocks in the legs and feet, returning to weight bearing and walking needs to be done carefully. You will need to wait until the numbness is entirely gone and the strength has returned. You should be able to move your leg and foot normally before you try and bear weight or walk. You will need someone to be with you when you first try to ensure you do not fall and possibly risk injury. ° °4. Bruising and tenderness at the needle site are common side effects and will resolve in a few days. ° °5. Persistent numbness or new problems with movement should be communicated to the surgeon or the [facility] ([phone])/ [facility] ([phone]). ° °Post Anesthesia Home Care Instructions ° °Activity: °Get plenty of rest for the remainder of the day. A responsible adult should stay with you for 24 hours following the procedure.  °For the next 24 hours, DO NOT: °-Drive a car °-Operate machinery °-Drink alcoholic beverages °-Take any medication unless instructed by your physician °-Make any legal decisions or sign important papers. ° °Meals: °Start with liquid foods such as gelatin or soup. Progress to regular foods as tolerated. Avoid greasy, spicy, heavy foods. If nausea and/or vomiting occur, drink only clear liquids until the nausea and/or vomiting subsides. Call your physician if vomiting continues. ° °  Special Instructions/Symptoms: °Your throat may feel dry or sore from the anesthesia or the breathing tube placed in your throat during surgery. If this causes discomfort, gargle with warm salt water. The discomfort should disappear within 24 hours. ° °If you had a scopolamine patch placed behind your ear for the management of post- operative nausea and/or vomiting: ° °1. The medication in the patch is effective for 72 hours, after which it  should be removed.  Wrap patch in a tissue and discard in the trash. Wash hands thoroughly with soap and water. °2. You may remove the patch earlier than 72 hours if you experience unpleasant side effects which may include dry mouth, dizziness or visual disturbances. °3. Avoid touching the patch. Wash your hands with soap and water after contact with the patch. °  ° ° °

## 2015-12-28 NOTE — Progress Notes (Signed)
Assisted Dr. Ben Judd with right, ultrasound guided, interscalene  block. Side rails up, monitors on throughout procedure. See vital signs in flow sheet. Tolerated Procedure well. 

## 2015-12-28 NOTE — Anesthesia Procedure Notes (Addendum)
Anesthesia Regional Block:  Interscalene brachial plexus block  Pre-Anesthetic Checklist: ,, timeout performed, Correct Patient, Correct Site, Correct Laterality, Correct Procedure, Correct Position, site marked, Risks and benefits discussed,  Surgical consent,  Pre-op evaluation,  At surgeon's request and post-op pain management  Laterality: Right  Prep: chloraprep       Needles:  Injection technique: Single-shot  Needle Type: Stimiplex     Needle Length: 9cm 9 cm Needle Gauge: 21 and 21 G    Additional Needles:  Procedures: ultrasound guided (picture in chart) Interscalene brachial plexus block Narrative:  Injection made incrementally with aspirations every 5 mL.  Performed by: Personally  Anesthesiologist: JUDD, BENJAMIN  Additional Notes: Risks, benefits and alternative to block explained extensively.  Patient tolerated procedure well, without complications.   Procedure Name: Intubation Date/Time: 12/28/2015 11:50 AM Performed by: Burna CashONRAD, Keidan Aumiller C Pre-anesthesia Checklist: Patient identified, Emergency Drugs available, Suction available and Patient being monitored Patient Re-evaluated:Patient Re-evaluated prior to inductionOxygen Delivery Method: Circle system utilized Preoxygenation: Pre-oxygenation with 100% oxygen Intubation Type: IV induction Ventilation: Mask ventilation without difficulty Laryngoscope Size: Mac and 4 Tube type: Oral Tube size: 8.0 mm Number of attempts: 1 Airway Equipment and Method: Stylet and Oral airway Placement Confirmation: ETT inserted through vocal cords under direct vision,  positive ETCO2 and breath sounds checked- equal and bilateral Secured at: 23 cm Tube secured with: Tape Dental Injury: Teeth and Oropharynx as per pre-operative assessment

## 2015-12-31 ENCOUNTER — Encounter (HOSPITAL_COMMUNITY): Payer: Self-pay | Admitting: *Deleted

## 2015-12-31 ENCOUNTER — Emergency Department (HOSPITAL_COMMUNITY)
Admission: EM | Admit: 2015-12-31 | Discharge: 2016-01-01 | Disposition: A | Discharge status: Home / Self Care | Payer: Managed Care, Other (non HMO) | Attending: Emergency Medicine | Admitting: Emergency Medicine

## 2015-12-31 ENCOUNTER — Emergency Department (HOSPITAL_COMMUNITY): Payer: Managed Care, Other (non HMO)

## 2015-12-31 ENCOUNTER — Emergency Department
Admission: EM | Admit: 2015-12-31 | Discharge: 2015-12-31 | Disposition: A | Discharge status: Other Acute Inpatient Hospital | Payer: Managed Care, Other (non HMO) | Source: Home / Self Care | Attending: Family Medicine | Admitting: Family Medicine

## 2015-12-31 DIAGNOSIS — Z87891 Personal history of nicotine dependence: Secondary | ICD-10-CM | POA: Diagnosis not present

## 2015-12-31 DIAGNOSIS — Z7982 Long term (current) use of aspirin: Secondary | ICD-10-CM | POA: Diagnosis not present

## 2015-12-31 DIAGNOSIS — R1084 Generalized abdominal pain: Secondary | ICD-10-CM

## 2015-12-31 DIAGNOSIS — E119 Type 2 diabetes mellitus without complications: Secondary | ICD-10-CM | POA: Diagnosis not present

## 2015-12-31 DIAGNOSIS — Z79899 Other long term (current) drug therapy: Secondary | ICD-10-CM | POA: Diagnosis not present

## 2015-12-31 DIAGNOSIS — K5900 Constipation, unspecified: Secondary | ICD-10-CM | POA: Insufficient documentation

## 2015-12-31 DIAGNOSIS — R112 Nausea with vomiting, unspecified: Secondary | ICD-10-CM | POA: Diagnosis not present

## 2015-12-31 DIAGNOSIS — Z794 Long term (current) use of insulin: Secondary | ICD-10-CM | POA: Diagnosis not present

## 2015-12-31 DIAGNOSIS — K5909 Other constipation: Secondary | ICD-10-CM

## 2015-12-31 DIAGNOSIS — I10 Essential (primary) hypertension: Secondary | ICD-10-CM | POA: Insufficient documentation

## 2015-12-31 LAB — CBC
HEMATOCRIT: 45.3 % (ref 39.0–52.0)
HEMOGLOBIN: 15.7 g/dL (ref 13.0–17.0)
MCH: 32.8 pg (ref 26.0–34.0)
MCHC: 34.7 g/dL (ref 30.0–36.0)
MCV: 94.6 fL (ref 78.0–100.0)
Platelets: 184 10*3/uL (ref 150–400)
RBC: 4.79 MIL/uL (ref 4.22–5.81)
RDW: 13.3 % (ref 11.5–15.5)
WBC: 8.4 10*3/uL (ref 4.0–10.5)

## 2015-12-31 LAB — URINALYSIS, ROUTINE W REFLEX MICROSCOPIC
Bilirubin Urine: NEGATIVE
Glucose, UA: >1000 mg/dL — AB
Hgb urine dipstick: NEGATIVE
Ketones, ur: 15 mg/dL — AB
LEUKOCYTES UA: NEGATIVE
NITRITE: NEGATIVE
PH: 5.5 (ref 5.0–8.0)
Protein, ur: NEGATIVE mg/dL
SPECIFIC GRAVITY, URINE: 1.024 (ref 1.005–1.030)

## 2015-12-31 LAB — COMPREHENSIVE METABOLIC PANEL
ALBUMIN: 4.1 g/dL (ref 3.5–5.0)
ALT: 33 U/L (ref 17–63)
ANION GAP: 9 (ref 5–15)
AST: 25 U/L (ref 15–41)
Alkaline Phosphatase: 58 U/L (ref 38–126)
BILIRUBIN TOTAL: 0.6 mg/dL (ref 0.3–1.2)
BUN: 19 mg/dL (ref 6–20)
CHLORIDE: 99 mmol/L — AB (ref 101–111)
CO2: 28 mmol/L (ref 22–32)
Calcium: 9.6 mg/dL (ref 8.9–10.3)
Creatinine, Ser: 1.15 mg/dL (ref 0.61–1.24)
GFR calc Af Amer: >60 mL/min (ref >60)
GLUCOSE: 130 mg/dL — AB (ref 65–99)
POTASSIUM: 4 mmol/L (ref 3.5–5.1)
Sodium: 136 mmol/L (ref 135–145)
TOTAL PROTEIN: 7.2 g/dL (ref 6.5–8.1)

## 2015-12-31 LAB — CBG MONITORING, ED: Glucose-Capillary: 115 mg/dL — ABNORMAL HIGH (ref 65–99)

## 2015-12-31 LAB — URINE MICROSCOPIC-ADD ON

## 2015-12-31 LAB — LIPASE, BLOOD: LIPASE: 18 U/L (ref 11–51)

## 2015-12-31 MED ORDER — MORPHINE SULFATE (PF) 4 MG/ML IV SOLN
4.0000 mg | Freq: Once | INTRAVENOUS | Status: AC
  Administered 2015-12-31: 4 mg via INTRAVENOUS
  Filled 2015-12-31: qty 1

## 2015-12-31 MED ORDER — IOPAMIDOL (ISOVUE-300) INJECTION 61%
INTRAVENOUS | Status: AC
  Administered 2015-12-31: 100 mL
  Filled 2015-12-31: qty 100

## 2015-12-31 MED ORDER — MILK AND MOLASSES ENEMA
1.0000 | Freq: Once | RECTAL | Status: AC
  Administered 2015-12-31: 250 mL via RECTAL
  Filled 2015-12-31: qty 250

## 2015-12-31 MED ORDER — PROMETHAZINE HCL 25 MG/ML IJ SOLN
25.0000 mg | Freq: Once | INTRAMUSCULAR | Status: AC
  Administered 2015-12-31: 25 mg via INTRAMUSCULAR

## 2015-12-31 MED ORDER — SODIUM CHLORIDE 0.9 % IV BOLUS (SEPSIS)
1000.0000 mL | Freq: Once | INTRAVENOUS | Status: AC
  Administered 2015-12-31: 1000 mL via INTRAVENOUS

## 2015-12-31 MED ORDER — KETOROLAC TROMETHAMINE 60 MG/2ML IM SOLN
60.0000 mg | Freq: Once | INTRAMUSCULAR | Status: AC
  Administered 2015-12-31: 60 mg via INTRAMUSCULAR

## 2015-12-31 MED ORDER — ONDANSETRON HCL 4 MG/2ML IJ SOLN
4.0000 mg | Freq: Once | INTRAMUSCULAR | Status: AC
  Administered 2015-12-31: 4 mg via INTRAVENOUS
  Filled 2015-12-31: qty 2

## 2015-12-31 NOTE — ED Provider Notes (Signed)
CSN: [id number]     Arrival date & time 12/31/15  1554 History   First MD Initiated Contact with Patient 12/31/15 1945     Chief Complaint  Patient presents with  . Constipation    Robert Hines is a 60 y.o. male who presents to the ED Complaining of constipation for the past 5 days. Patient reports he had a right rotator cuff surgery 3 days ago and has been unable to have a bowel movement. He reports his last bowel movement was 5 days ago. He reports today he began having generalized abdominal pain and has vomited several times. He reports he is attempted using MiraLAX, Colace, mineral oil and a Fleet enema without success. Patient was evaluated in urgent care and sent to the emergency department. He does have a history of a left inguinal hernia repair. He has been taking Percocet for his pain. Patient denies fevers, urinary symptoms, chest pain, hematemesis, or rashes.   The history is provided by the patient. No language interpreter was used.    Past Medical History  Diagnosis Date  . Hyperlipidemia   . Hypertension   . Diabetes mellitus without complication (HCC)   . Spinal pain   . Depression   . Arthritis     back  . Renal disorder     calculi, multiple lithotripsies, stents  . Rotator cuff disorder    Past Surgical History  Procedure Laterality Date  . Vasectomy Bilateral   . Hernia repair    . Foot surgery    . Anterior fusion cervical spine    . Lithotripsy    . Shoulder arthroscopy with rotator cuff repair and subacromial decompression Right 12/28/2015    Procedure: SHOULDER ARTHROSCOPY TAKE DOWN ROTATOR CUFF REPAIR AND SUBACROMIAL DECOMPRESSION, DEBRIDEMENT OF SLAP TEAR;  Surgeon: Jones Broom, MD;  Location: [facility];  Service: Orthopedics;  Laterality: Right;  SHOULDER ARTHROSCOPY TAKE DOWN ROTATOR CUFF REPAIR AND SUBACROMIAL DECOMPRESSION   Family History  Problem Relation Age of Onset  . Hypertension Mother   . Heart failure Father   .  Diabetes Father   . Hypertension Father   . Heart failure Sister   . Hypertension Sister    Social History  Substance Use Topics  . Smoking status: Former Smoker    Quit date: 10/01/2014  . Smokeless tobacco: Never Used  . Alcohol Use: 0.0 oz/week    0 Standard drinks or equivalent per week     Comment: social    Review of Systems  Constitutional: Negative for fever and chills.  HENT: Negative for congestion and sore throat.   Eyes: Negative for visual disturbance.  Respiratory: Negative for cough and shortness of breath.   Cardiovascular: Negative for chest pain.  Gastrointestinal: Positive for nausea, vomiting, abdominal pain and constipation. Negative for diarrhea and blood in stool.  Genitourinary: Negative for dysuria and difficulty urinating.  Musculoskeletal: Positive for arthralgias. Negative for back pain and neck pain.  Skin: Negative for rash.  Neurological: Negative for syncope and headaches.      Allergies  Codeine and Duloxetine  Home Medications   Prior to Admission medications   Medication Sig Start Date End Date Taking? Authorizing Provider  aspirin 81 MG chewable tablet Chew by mouth daily.    Historical Provider, MD  buPROPion (WELLBUTRIN XL) 300 MG 24 hr tablet TAKE 1 TABLET BY MOUTH DAILY 11/17/15   Monica Becton, MD  cyclobenzaprine (FLEXERIL) 10 MG tablet Take 10 mg by mouth 3 (three)  times daily as needed for muscle spasms.    Historical Provider, MD  docusate sodium (COLACE) 100 MG capsule Take 1 capsule (100 mg total) by mouth 3 (three) times daily as needed. 12/28/15   Jiles Harold, PA-C  gabapentin (NEURONTIN) 300 MG capsule 3 caps in the morning, 4 caps in the evening. 03/23/15   Monica Becton, MD  Insulin Glargine (TOUJEO SOLOSTAR) 300 UNIT/ML SOPN Inject 84 Units into the skin daily. Increase by 2 units if morning blood sugar is above 100. 03/02/15   Monica Becton, MD  Insulin Pen Needle 32G X 4 MM MISC Use as needed  with toujeo pen 08/04/15   Monica Becton, MD  lisinopril-hydrochlorothiazide (PRINZIDE,ZESTORETIC) 20-25 MG tablet TAKE 1 TABLET BY MOUTH DAILY 11/17/15   Monica Becton, MD  ondansetron (ZOFRAN) 4 MG tablet Take 1 tablet (4 mg total) by mouth every 8 (eight) hours as needed for nausea or vomiting. 01/01/16   Everlene Farrier, PA-C  oxyCODONE-acetaminophen (ROXICET) 5-325 MG tablet Take 1-2 tablets by mouth every 4 (four) hours as needed for severe pain. 12/28/15   Jiles Harold, PA-C  polyethylene glycol powder (GLYCOLAX/MIRALAX) powder Take 17 g by mouth 2 (two) times daily. Until daily soft stools  OTC 01/01/16   Everlene Farrier, PA-C  simvastatin (ZOCOR) 40 MG tablet TAKE 1 TABLET BY MOUTH DAILY 11/17/15   Monica Becton, MD  traMADol Janean Sark) 50 MG tablet 1-2 tabs by mouth at bedtime 08/04/15   Monica Becton, MD  TRULICITY 1.5 MG/0.5ML SOPN INJECT 1.5MG  SUBCUTANEOUSLY WEEKLY. 09/16/15   Monica Becton, MD  XIGDUO XR 03-999 MG TB24 TAKE 1 TABLET EVERY DAY 08/31/15   Monica Becton, MD  zolpidem (AMBIEN) 10 MG tablet Take 1 tablet (10 mg total) by mouth at bedtime as needed for sleep. 11/17/15   Monica Becton, MD   BP 149/90 mmHg  Pulse 93  Temp(Src) 97.6 F (36.4 C) (Oral)  Resp 18  SpO2 98% Physical Exam  Constitutional: He appears well-developed and well-nourished. No distress.  Nontoxic appearing. Patient appears uncomfortable.  HENT:  Head: Normocephalic and atraumatic.  Mouth/Throat: Oropharynx is clear and moist.  Eyes: Conjunctivae are normal. Pupils are equal, round, and reactive to light. Right eye exhibits no discharge. Left eye exhibits no discharge.  Neck: Neck supple.  Cardiovascular: Normal rate, regular rhythm, normal heart sounds and intact distal pulses.   Pulmonary/Chest: Effort normal and breath sounds normal. No respiratory distress. He has no wheezes. He has no rales.  Lungs clear to auscultation bilaterally.   Abdominal: Soft. Bowel sounds are normal. He exhibits no distension and no mass. There is tenderness. There is no rebound and no guarding.  Abdomen is soft. Bowel sounds are hypoactive. Patient has mild generalized abdominal tenderness to palpation without focal tenderness.  Musculoskeletal: He exhibits no edema.  Patient's right arm in sling.  Lymphadenopathy:    He has no cervical adenopathy.  Neurological: He is alert. Coordination normal.  Skin: Skin is warm and dry. No rash noted. He is not diaphoretic. No erythema. No pallor.  Psychiatric: He has a normal mood and affect. His behavior is normal.  Nursing note and vitals reviewed.   ED Course  Procedures (including critical care time) Labs Review Labs Reviewed  COMPREHENSIVE METABOLIC PANEL - Abnormal; Notable for the following:    Chloride 99 (*)    Glucose, Bld 130 (*)    All other components within normal limits  URINALYSIS, ROUTINE W REFLEX MICROSCOPIC (  NOT [facility]) - Abnormal; Notable for the following:    Glucose, UA >1000 (*)    Ketones, ur 15 (*)    All other components within normal limits  URINE MICROSCOPIC-ADD ON - Abnormal; Notable for the following:    Squamous Epithelial / LPF 0-5 (*)    Bacteria, UA RARE (*)    Casts HYALINE CASTS (*)    All other components within normal limits  CBG MONITORING, ED - Abnormal; Notable for the following:    Glucose-Capillary 115 (*)    All other components within normal limits  LIPASE, BLOOD  CBC    Imaging Review Ct Abdomen Pelvis W Contrast  12/31/2015  CLINICAL DATA:  Constipation and vomiting for several days. Three days postop from right shoulder surgery. EXAM: CT ABDOMEN AND PELVIS WITH CONTRAST TECHNIQUE: Multidetector CT imaging of the abdomen and pelvis was performed using the standard protocol following bolus administration of intravenous contrast. CONTRAST:  100 mL Isovue 300 COMPARISON:  None. FINDINGS: Lower chest:  Mild bibasilar atelectasis or scarring.  Hepatobiliary: Mild hepatic steatosis. No liver masses identified. Gallbladder is unremarkable. Pancreas: No mass, inflammatory changes, or other significant abnormality. Spleen: Within normal limits in size and appearance. Adrenals/Urinary Tract: Normal adrenal glands. Tiny less than 5 mm bilateral intrarenal calculi. No evidence of hydronephrosis. Several simple left renal cysts. No evidence of renal mass. No evidence of ureteral calculi or dilatation. Unopacified urinary bladder is unremarkable in appearance. Stomach/Bowel: No evidence of obstruction, inflammatory process, or abnormal fluid collections. Moderate colonic stool burden noted, without dilatation. Vascular/Lymphatic: No pathologically enlarged lymph nodes. No evidence of abdominal aortic aneurysm. Aortic atherosclerosis noted. Reproductive: No mass or other significant abnormality. Other: None. Musculoskeletal:  No suspicious bone lesions identified. IMPRESSION: No acute findings identified within the abdomen or pelvis. Bilateral nonobstructive renal calculi and left renal cysts. No evidence of ureteral calculi or hydronephrosis. Aortic atherosclerosis noted. Mild bibasilar atelectasis versus scarring. Electronically Signed   By: Myles RosenthalJohn  Stahl M.D.   On: 12/31/2015 21:55   I have personally reviewed and evaluated these images and lab results as part of my medical decision-making.   EKG Interpretation None      Filed Vitals:   12/31/15 1626 12/31/15 1826 12/31/15 1957  BP: 129/80 131/74 149/90  Pulse: 108 94 93  Temp: 98.6 F (37 C) 97.9 F (36.6 C) 97.6 F (36.4 C)  TempSrc: Oral Oral Oral  Resp: 16 20 18   SpO2: 98% 99% 98%     MDM   Meds given in ED:  Medications  sodium chloride 0.9 % bolus 1,000 mL (1,000 mLs Intravenous New Bag/Given 12/31/15 2044)  ondansetron (ZOFRAN) injection 4 mg (4 mg Intravenous Given 12/31/15 2043)  morphine 4 MG/ML injection 4 mg (4 mg Intravenous Given 12/31/15 2043)  iopamidol (ISOVUE-300) 61 %  injection (100 mLs  Contrast Given 12/31/15 2124)  milk and molasses enema (250 mLs Rectal Given 12/31/15 2309)  ondansetron (ZOFRAN) injection 4 mg (4 mg Intravenous Given 01/01/16 0058)    New Prescriptions   ONDANSETRON (ZOFRAN) 4 MG TABLET    Take 1 tablet (4 mg total) by mouth every 8 (eight) hours as needed for nausea or vomiting.   POLYETHYLENE GLYCOL POWDER (GLYCOLAX/MIRALAX) POWDER    Take 17 g by mouth 2 (two) times daily. Until daily soft stools  OTC    Final diagnoses:  Constipation, unspecified constipation type   This is a 60 y.o. male who presents to the ED Complaining of constipation for the past  5 days. Patient reports he had a right rotator cuff surgery 3 days ago and has been unable to have a bowel movement. He reports his last bowel movement was 5 days ago. He reports today he began having generalized abdominal pain and has vomited several times. He reports he is attempted using MiraLAX, Colace, mineral oil and a Fleet enema without success. Patient was evaluated in urgent care and sent to the emergency department.  On exam the patient is afebrile nontoxic appearing. His abdomen is soft and has mild generalized abdominal tenderness to palpation. No focal tenderness. CBC and CMP are unremarkable. CT abdomen and pelvis with contrast identifies no acute findings within the abdomen and pelvis. He has moderate colonic stool burden without dilation. No evidence of obstruction. Patient had a milk and molasses enema and had a bowel movement. We'll discharge MiraLAX and Zofran tablets. I discussed high-fiber diet and he is to helping prevent constipation while he is taking his narcotic pain medicines with his shoulder pain. Prior to discharge patient is tolerating by mouth without vomiting or nausea. I discussed return precautions. I advised the patient to follow-up with their primary care provider this week. I advised the patient to return to the emergency department with new or worsening  symptoms or new concerns. The patient verbalized understanding and agreement with plan.       Everlene Farrier, PA-C 01/01/16 4098  Geoffery Lyons, MD 01/01/16 1050

## 2015-12-31 NOTE — ED Notes (Signed)
Pt states that he had right rotator cuff surgery 3 days ago and has not had a bowel movement since 2 days before surgery. States he has taken Miralax, Colace, 2 enemas, mag citrate and mineral oil with no relief. States he vomited this morning and there were pineapple chunks from Tuesday in his emesis. Pt abd appears distended.

## 2015-12-31 NOTE — ED Notes (Signed)
Waiting for enema from pharmacy

## 2015-12-31 NOTE — ED Provider Notes (Signed)
CSN: [id number]     Arrival date & time 12/31/15  1325 History   First MD Initiated Contact with Patient 12/31/15 1413     Chief Complaint  Patient presents with  . Emesis  . Constipation      HPI Comments: Patient is status post right rotator cuff surgery four days ago.  He complains of persistent constipation since his surgery (last normal bowel movement was about one week ago).  He has gradually increasing nausea with vomiting and abdominal cramping.  He reports that today he vomited some pineapple that he had eaten two days ago.  His constipation has not responded to multiple doses of Miralax, Colace, enemas, and oral mineral oil.  He has persistent nausea despite Phenergan.  He reports that he has had sweats during the past two days. Past surgical history of left inguinal hernia repair.  The history is provided by the patient and the spouse.    Past Medical History  Diagnosis Date  . Hyperlipidemia   . Hypertension   . Diabetes mellitus without complication (HCC)   . Spinal pain   . Depression   . Arthritis     back  . Renal disorder     calculi, multiple lithotripsies, stents  . Rotator cuff disorder    Past Surgical History  Procedure Laterality Date  . Vasectomy Bilateral   . Hernia repair    . Foot surgery    . Anterior fusion cervical spine    . Lithotripsy     Family History  Problem Relation Age of Onset  . Hypertension Mother   . Heart failure Father   . Diabetes Father   . Hypertension Father   . Heart failure Sister   . Hypertension Sister    Social History  Substance Use Topics  . Smoking status: Former Smoker    Quit date: 10/01/2014  . Smokeless tobacco: Never Used  . Alcohol Use: 0.0 oz/week    0 Standard drinks or equivalent per week     Comment: social    Review of Systems  Constitutional: Positive for diaphoresis, activity change, appetite change and fatigue. Negative for fever and chills.  Eyes: Negative.   Respiratory: Negative.    Cardiovascular: Negative.   Gastrointestinal: Positive for nausea, vomiting, abdominal pain, constipation and abdominal distention. Negative for diarrhea, blood in stool and rectal pain.  Genitourinary: Negative.   Musculoskeletal: Negative for neck stiffness.  Skin: Negative.   Neurological: Positive for headaches.    Allergies  Codeine and Duloxetine  Home Medications   Prior to Admission medications   Medication Sig Start Date End Date Taking? Authorizing Provider  aspirin 81 MG chewable tablet Chew by mouth daily.    Historical Provider, MD  buPROPion (WELLBUTRIN XL) 300 MG 24 hr tablet TAKE 1 TABLET BY MOUTH DAILY 11/17/15   Monica Becton, MD  cyclobenzaprine (FLEXERIL) 10 MG tablet Take 10 mg by mouth 3 (three) times daily as needed for muscle spasms.    Historical Provider, MD  docusate sodium (COLACE) 100 MG capsule Take 1 capsule (100 mg total) by mouth 3 (three) times daily as needed. 12/28/15   Jiles Harold, PA-C  gabapentin (NEURONTIN) 300 MG capsule 3 caps in the morning, 4 caps in the evening. 03/23/15   Monica Becton, MD  Insulin Glargine (TOUJEO SOLOSTAR) 300 UNIT/ML SOPN Inject 84 Units into the skin daily. Increase by 2 units if morning blood sugar is above 100. 03/02/15   Monica Becton, MD  Insulin  Pen Needle 32G X 4 MM MISC Use as needed with toujeo pen 08/04/15   Monica Bectonhomas J Thekkekandam, MD  lisinopril-hydrochlorothiazide (PRINZIDE,ZESTORETIC) 20-25 MG tablet TAKE 1 TABLET BY MOUTH DAILY 11/17/15   Monica Bectonhomas J Thekkekandam, MD  oxyCODONE-acetaminophen (ROXICET) 5-325 MG tablet Take 1-2 tablets by mouth every 4 (four) hours as needed for severe pain. 12/28/15   Jiles Haroldanielle Laliberte, PA-C  simvastatin (ZOCOR) 40 MG tablet TAKE 1 TABLET BY MOUTH DAILY 11/17/15   Monica Bectonhomas J Thekkekandam, MD  traMADol Janean Sark(ULTRAM) 50 MG tablet 1-2 tabs by mouth at bedtime 08/04/15   Monica Bectonhomas J Thekkekandam, MD  TRULICITY 1.5 MG/0.5ML [facility] INJECT 1.5MG  SUBCUTANEOUSLY WEEKLY. 09/16/15    Monica Bectonhomas J Thekkekandam, MD  XIGDUO XR 03-999 MG TB24 TAKE 1 TABLET EVERY DAY 08/31/15   Monica Bectonhomas J Thekkekandam, MD  zolpidem (AMBIEN) 10 MG tablet Take 1 tablet (10 mg total) by mouth at bedtime as needed for sleep. 11/17/15   Monica Bectonhomas J Thekkekandam, MD   Meds Ordered and Administered this Visit   Medications  promethazine (PHENERGAN) injection 25 mg (25 mg Intramuscular Given 12/31/15 1411)  ketorolac (TORADOL) injection 60 mg (60 mg Intramuscular Given 12/31/15 1411)    BP 134/83 mmHg  Pulse 100  Temp(Src) 97.6 F (36.4 C) (Oral)  Resp 20  Wt 235 lb (106.595 kg)  SpO2 97% No data found.   Physical Exam Nursing notes and Vital Signs reviewed. Appearance:  Patient appears stated age, and uncomfortable but in no acute distress Eyes:  Pupils are equal, round, and reactive to light and accomodation.  Extraocular movement is intact.  Conjunctivae are not inflamed  Nose:   Normal Pharynx:  Normal; moist mucous membranes  Neck:  Supple.  No adenopathy Lungs:  Clear to auscultation.  Breath sounds are equal.  Moving air well. Heart:  Regular rate and rhythm without murmurs, rubs, or gallops.  Rate 100 Abdomen:   Diffuse mild tenderness without masses or hepatosplenomegaly.  Bowel sounds are present.  No CVA or flank tenderness.  No guarding. Extremities:  Trace edema lower extremities.  Wearing sling right shoulder:  Diffuse right shoulder tenderness to palpation  Skin:  No rash present.   ED Course  Procedures none    MDM   1. Generalized abdominal pain   2. Non-intractable vomiting with nausea, unspecified vomiting type   3. Other constipation    Suspect functional bowel obstruction; consider mechanical obstruction (note past history of left inguinal hernia repair). Advised to proceed [facility] for further evaluation/treatment.    Lattie HawStephen A Beese, MD 12/31/15 1450

## 2015-12-31 NOTE — ED Notes (Signed)
Patient transported to CT 

## 2015-12-31 NOTE — ED Notes (Signed)
Pt had recent rotator cuff surgery, last BM on Saturday. Has attempted many OTC stool softeners and laxatives without relief. Pt c/o severe nausea and abd pain.

## 2015-12-31 NOTE — Discharge Instructions (Signed)
Proceed to [facility] for further evaluation/treatment

## 2015-12-31 NOTE — ED Notes (Signed)
Pt had right rotator cuff surgery 3 days ago. His last bowel movement was 2 days prior. He has tried Miralx and colace and 2 enema without resolve. Last night developed nausea and vomiting.

## 2016-01-01 MED ORDER — ONDANSETRON HCL 4 MG/2ML IJ SOLN
4.0000 mg | INTRAMUSCULAR | Status: AC
  Administered 2016-01-01: 4 mg via INTRAVENOUS
  Filled 2016-01-01: qty 2

## 2016-01-01 MED ORDER — ONDANSETRON HCL 4 MG PO TABS: 4.0000 mg | ORAL_TABLET | Freq: Three times a day (TID) | ORAL | Status: DC | PRN

## 2016-01-01 MED ORDER — POLYETHYLENE GLYCOL 3350 17 GM/SCOOP PO POWD: 17.0000 g | Freq: Two times a day (BID) | ORAL | Status: DC

## 2016-01-01 NOTE — Discharge Instructions (Signed)
Constipation, Adult °Constipation is when a person has fewer than three bowel movements a week, has difficulty having a bowel movement, or has stools that are dry, hard, or larger than normal. As people grow older, constipation is more common. A low-fiber diet, not taking in enough fluids, and taking certain medicines may make constipation worse.  °CAUSES  °· Certain medicines, such as antidepressants, pain medicine, iron supplements, antacids, and water pills.   °· Certain diseases, such as diabetes, irritable bowel syndrome (IBS), thyroid disease, or depression.   °· Not drinking enough water.   °· Not eating enough fiber-rich foods.   °· Stress or travel.   °· Lack of physical activity or exercise.   °· Ignoring the urge to have a bowel movement.   °· Using laxatives too much.   °SIGNS AND SYMPTOMS  °· Having fewer than three bowel movements a week.   °· Straining to have a bowel movement.   °· Having stools that are hard, dry, or larger than normal.   °· Feeling full or bloated.   °· Pain in the lower abdomen.   °· Not feeling relief after having a bowel movement.   °DIAGNOSIS  °Your health care provider will take a medical history and perform a physical exam. Further testing may be done for severe constipation. Some tests may include: °· A barium enema X-ray to examine your rectum, colon, and, sometimes, your small intestine.   °· A sigmoidoscopy to examine your lower colon.   °· A colonoscopy to examine your entire colon. °TREATMENT  °Treatment will depend on the severity of your constipation and what is causing it. Some dietary treatments include drinking more fluids and eating more fiber-rich foods. Lifestyle treatments may include regular exercise. If these diet and lifestyle recommendations do not help, your health care provider may recommend taking over-the-counter laxative medicines to help you have bowel movements. Prescription medicines may be prescribed if over-the-counter medicines do not work.    °HOME CARE INSTRUCTIONS  °· Eat foods that have a lot of fiber, such as fruits, vegetables, whole grains, and beans. °· Limit foods high in fat and processed sugars, such as french fries, hamburgers, cookies, candies, and soda.   °· A fiber supplement may be added to your diet if you cannot get enough fiber from foods.   °· Drink enough fluids to keep your urine clear or pale yellow.   °· Exercise regularly or as directed by your health care provider.   °· Go to the restroom when you have the urge to go. Do not hold it.   °· Only take over-the-counter or prescription medicines as directed by your health care provider. Do not take other medicines for constipation without talking to your health care provider first.   °SEEK IMMEDIATE MEDICAL CARE IF:  °· You have bright red blood in your stool.   °· Your constipation lasts for more than 4 days or gets worse.   °· You have abdominal or rectal pain.   °· You have thin, pencil-like stools.   °· You have unexplained weight loss. °MAKE SURE YOU:  °· Understand these instructions. °· Will watch your condition. °· Will get help right away if you are not doing well or get worse. °  °This information is not intended to replace advice given to you by your health care provider. Make sure you discuss any questions you have with your health care provider. °  °Document Released: 03/04/2004 Document Revised: 06/27/2014 Document Reviewed: 03/18/2013 °Elsevier Interactive Patient Education ©2016 [employer] Inc[employer] ° °High-Fiber Diet °Fiber, also called dietary fiber, is a type of carbohydrate found in fruits, vegetables, whole grains, and   beans. A high-fiber diet can have many health benefits. Your health care provider may recommend a high-fiber diet to help: °· Prevent constipation. Fiber can make your bowel movements more regular. °· Lower your cholesterol. °· Relieve hemorrhoids, uncomplicated diverticulosis, or irritable bowel syndrome. °· Prevent overeating as part of a weight-loss  plan. °· Prevent heart disease, type 2 diabetes, and certain cancers. °WHAT IS MY PLAN? °The recommended daily intake of fiber includes: °· 38 grams for men under age 50. °· 30 grams for men over age 50. °· 25 grams for women under age 50. °· 21 grams for women over age 50. °You can get the recommended daily intake of dietary fiber by eating a variety of fruits, vegetables, grains, and beans. Your health care provider may also recommend a fiber supplement if it is not possible to get enough fiber through your diet. °WHAT DO I NEED TO KNOW ABOUT A HIGH-FIBER DIET? °· Fiber supplements have not been widely studied for their effectiveness, so it is better to get fiber through food sources. °· Always check the fiber content on the nutrition facts label of any prepackaged food. Look for foods that contain at least 5 grams of fiber per serving. °· Ask your dietitian if you have questions about specific foods that are related to your condition, especially if those foods are not listed in the following section. °· Increase your daily fiber consumption gradually. Increasing your intake of dietary fiber too quickly may cause bloating, cramping, or gas. °· Drink plenty of water. Water helps you to digest fiber. °WHAT FOODS CAN I EAT? °Grains °Whole-grain breads. Multigrain cereal. Oats and oatmeal. Brown rice. Barley. Bulgur wheat. Millet. Bran muffins. Popcorn. Rye wafer crackers. °Vegetables °Sweet potatoes. Spinach. Kale. Artichokes. Cabbage. Broccoli. Green peas. Carrots. Squash. °Fruits °Berries. Pears. Apples. Oranges. Avocados. Prunes and raisins. Dried figs. °Meats and Other Protein Sources °Navy, kidney, pinto, and soy beans. Split peas. Lentils. Nuts and seeds. °Dairy °Fiber-fortified yogurt. °Beverages °Fiber-fortified soy milk. Fiber-fortified orange juice. °Other °Fiber bars. °The items listed above may not be a complete list of recommended foods or beverages. Contact your dietitian for more options. °WHAT FOODS  ARE NOT RECOMMENDED? °Grains °White bread. Pasta made with refined flour. White rice. °Vegetables °Fried potatoes. Canned vegetables. Well-cooked vegetables.  °Fruits °Fruit juice. Cooked, strained fruit. °Meats and Other Protein Sources °Fatty cuts of meat. Fried poultry or fried fish. °Dairy °Milk. Yogurt. Cream cheese. Sour cream. °Beverages °Soft drinks. °Other °Cakes and pastries. Butter and oils. °The items listed above may not be a complete list of foods and beverages to avoid. Contact your dietitian for more information. °WHAT ARE SOME TIPS FOR INCLUDING HIGH-FIBER FOODS IN MY DIET? °· Eat a wide variety of high-fiber foods. °· Make sure that half of all grains consumed each day are whole grains. °· Replace breads and cereals made from refined flour or white flour with whole-grain breads and cereals. °· Replace white rice with brown rice, bulgur wheat, or millet. °· Start the day with a breakfast that is high in fiber, such as a cereal that contains at least 5 grams of fiber per serving. °· Use beans in place of meat in soups, salads, or pasta. °· Eat high-fiber snacks, such as berries, raw vegetables, nuts, or popcorn. °  °This information is not intended to replace advice given to you by your health care provider. Make sure you discuss any questions you have with your health care provider. °  °Document Released: 06/06/2005 Document Revised: 06/27/2014 Document   Reviewed: 11/19/2013 °Elsevier Interactive Patient Education ©2016 Elsevier Inc. ° °

## 2016-01-02 ENCOUNTER — Telehealth: Payer: Self-pay | Admitting: Emergency Medicine

## 2016-01-02 NOTE — Telephone Encounter (Signed)
Inquired about patient's status; encourage them to call with questions/concerns.  

## 2016-01-03 ENCOUNTER — Other Ambulatory Visit: Payer: Self-pay | Admitting: Sports Medicine

## 2016-01-12 ENCOUNTER — Encounter: Payer: Self-pay | Admitting: Rehabilitative and Restorative Service Providers"

## 2016-01-12 ENCOUNTER — Ambulatory Visit (INDEPENDENT_AMBULATORY_CARE_PROVIDER_SITE_OTHER): Payer: Managed Care, Other (non HMO) | Admitting: Rehabilitative and Restorative Service Providers"

## 2016-01-12 DIAGNOSIS — M6281 Muscle weakness (generalized): Secondary | ICD-10-CM | POA: Diagnosis not present

## 2016-01-12 DIAGNOSIS — R293 Abnormal posture: Secondary | ICD-10-CM | POA: Diagnosis not present

## 2016-01-12 DIAGNOSIS — M25511 Pain in right shoulder: Secondary | ICD-10-CM

## 2016-01-12 NOTE — Patient Instructions (Addendum)
Shoulder Blade Squeeze   Using swim noodle to help with posture Rotate shoulders back, then squeeze shoulder blades down and back. Hold 10 sec Repeat __10__ times. Do _several __ sessions per day.     Pendulum Circular    Bend forward 90 at waist, leaning on table for support. Rock body in a circular pattern to move arm clockwise _30__ times then counterclockwise __30__ times. Do _3-4___ sessions per day.   ROM: Flexion    Keeping left arm on table, slide body away until stretch is felt. Hold __10__ seconds. Repeat __10__ times per set. Do _2-3___ sessions per day.   AROM: Lateral Neck Flexion    Slowly tilt head toward one shoulder, then the other. Hold each position __10-15__ seconds. Repeat __2-3__ times per set.  Do __2-3__ sessions per day.

## 2016-01-12 NOTE — Therapy (Signed)
[facility] [place] [address] [place], [place], [address] Phone: [phone]   Fax:  [phone]  Physical Therapy Evaluation  Patient Details  Name: Robert Hines MRN: [id number] Date of Birth: 1955/07/12 Referring Provider: Dr, Jones Broom   Encounter Date: 01/12/2016      PT End of Session - 01/12/16 1239    Visit Number 1   Number of Visits 24   Date for PT Re-Evaluation 03/08/16   PT Start Time 1112   PT Stop Time 1207   PT Time Calculation (min) 55 min   Activity Tolerance Patient tolerated treatment well      Past Medical History:  Diagnosis Date  . Arthritis    back  . Depression   . Diabetes mellitus without complication (HCC)   . Hyperlipidemia   . Hypertension   . Renal disorder    calculi, multiple lithotripsies, stents  . Rotator cuff disorder   . Spinal pain     Past Surgical History:  Procedure Laterality Date  . ANTERIOR FUSION CERVICAL SPINE    . FOOT SURGERY    . HERNIA REPAIR    . LITHOTRIPSY    . SHOULDER ARTHROSCOPY WITH ROTATOR CUFF REPAIR AND SUBACROMIAL DECOMPRESSION Right 12/28/2015   Procedure: SHOULDER ARTHROSCOPY TAKE DOWN ROTATOR CUFF REPAIR AND SUBACROMIAL DECOMPRESSION, DEBRIDEMENT OF SLAP TEAR;  Surgeon: Jones Broom, MD;  Location: [facility];  Service: Orthopedics;  Laterality: Right;  SHOULDER ARTHROSCOPY TAKE DOWN ROTATOR CUFF REPAIR AND SUBACROMIAL DECOMPRESSION  . VASECTOMY Bilateral     There were no vitals filed for this visit.       Subjective Assessment - 01/12/16 1114    Subjective Patient reports that he has had shoulder pain for several years but was splitting wood 8/16 when he had significant increase in shoulder pain. He tried a few visits of therapy with no improvement. Underwent RCR; DCR; SAD 12/28/15.    Pertinent History HTN; cervical fusion ~ 7 yrs ago; HNP cervical spine no surgery ~5 yrs ago    How long can you sit comfortably?  1-2 hours   How long can you stand comfortably? no limit   How long can you walk comfortably? no limit   Diagnostic tests xrays; MRI   Patient Stated Goals use shoulder again without pain   Currently in Pain? No/denies   Pain Score 0-No pain   Pain Location Shoulder   Pain Orientation Right   Pain Descriptors / Indicators Sharp   Pain Type Surgical pain   Pain Onset 1 to 4 weeks ago   Pain Frequency Intermittent   Aggravating Factors  certain movements; lying Rt side    Pain Relieving Factors ice rest meds PRN             [facility] PT Assessment - 01/12/16 0001      Assessment   Medical Diagnosis Rt RCR; DCR; SAD    Referring Provider Dr, Jones Broom    Onset Date/Surgical Date 12/28/15   Hand Dominance Right   Next MD Visit 01/26/16   Prior Therapy yes here      Precautions   Precaution Comments no active movement/use of Rt UE      Restrictions   Other Position/Activity Restrictions per MD protocol      Balance Screen   Has the patient fallen in the past 6 months No   Has the patient had a decrease in activity level because of a fear of falling?  No  Is the patient reluctant to leave their home because of a fear of falling?  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 [employer] - must be 100% to return work  50-55 hr/wk for 41 yrs   Leisure yard work; garden; IT trainer; out of doors; dogs/kids      Observation/Other Assessments   Observations presents Rt UE in sling    Skin Integrity well healed portal sites Rt shd   Focus on Therapeutic Outcomes (FOTO)  68% limitation      Sensation   Additional Comments WNL's per pt report      Posture/Postural Control   Posture Comments head forward shoulders rounded and elevated; head of the humerus anterior in orientation; scapulae abducted and rotated along the thoracic wall      AROM    Right/Left Shoulder --  assesed in standing IR/ER at 90 deg abd    Left Shoulder Extension 56 Degrees   Left Shoulder Flexion 127 Degrees   Left Shoulder ABduction 125 Degrees   Left Shoulder Internal Rotation 32 Degrees   Left Shoulder External Rotation 90 Degrees   Cervical Flexion 49   Cervical Extension 35   Cervical - Right Side Bend 33   Cervical - Left Side Bend 35   Cervical - Right Rotation 68   Cervical - Left Rotation 62     PROM   Right Shoulder Flexion 70 Degrees  with table slide      Strength   Overall Strength Comments NT due to surgery      Palpation   Palpation comment muscular tightness through the upper trap; leveator; pecs; teres Rt UE                   [facility] Adult PT Treatment/Exercise - 01/12/16 0001      Self-Care   Self-Care --  positioning for rest for shd - sitting arm supported at 30 d     Neuro Re-ed    Neuro Re-ed Details  working on posture and alignment engaging posterior shoulder girdle      Shoulder Exercises: Standing   Other Standing Exercises scap squeeze with noodle 10 sec x 10    Other Standing Exercises pendulum 30 CW/30 CCW     Shoulder Exercises: Stretch   Table Stretch - Flexion 5 reps;10 seconds     Vasopneumatic   Number Minutes Vasopneumatic  15 minutes   Vasopnuematic Location  Shoulder  Rt   Vasopneumatic Pressure Low   Vasopneumatic Temperature  3*                PT Education - 01/12/16 1159    Education provided Yes   Education Details HEP; postural work    Teacher, music) Educated Patient   Methods Explanation;Demonstration;Tactile cues;Verbal cues;Handout   Comprehension Verbalized understanding;Returned demonstration;Verbal cues required;Tactile cues required          PT Short Term Goals - 01/12/16 1251      PT SHORT TERM GOAL #1   Title Improve posture and alignment with patient engaging posterior shoulder girdle musculature 02/09/16   Time 6   Period Weeks   Status New     PT SHORT  TERM GOAL #2   Title Initiate AAROM/AROM exercises, progressing per protocol 02/09/16   Time 6   Period Weeks   Status New     PT  SHORT TERM GOAL #3   Title Education re posture; alignment; progressive exercise program; rehab 02/09/16   Time 6   Period Weeks   Status New           PT Long Term Goals - 01/12/16 1254      PT LONG TERM GOAL #1   Title Improve posture and alignment with patient demonstrating posterior shoulder girlde control for UE function 03/08/16   Time 12   Period Weeks   Status New     PT LONG TERM GOAL #2   Title Improve Rt shoulder ROM to equal or greater than Lt shoulder ROM 03/08/16   Time 12   Period Weeks   Status New     PT LONG TERM GOAL #3   Title Rt shoulder strength =/> 5-/5 without pain 03/08/16   Time 12   Period Weeks   Status New     PT LONG TERM GOAL #4   Title Independent in HEP 03/08/16   Time 12   Period Weeks   Status New     PT LONG TERM GOAL #5   Title improve FOTO =/< 39% impaired 03/08/16   Time 12   Period Weeks   Status New               Plan - 01/12/16 1240    Clinical Impression Statement Robert Hines presents s/p Rt shoulder RCR; SAD; DCR 12/28/15. He continues in sling for Rt UE. He has poor posture and alignment; decreased ROM/strength/function Rt UE.   Rehab Potential Good   PT Frequency 2x / week   PT Duration 12 weeks   PT Treatment/Interventions Patient/family education;ADLs/Self Care Home Management;Cryotherapy;Electrical Stimulation;Iontophoresis 4mg /ml Dexamethasone;Moist Heat;Traction;Ultrasound;Manual techniques;Dry needling;Therapeutic activities;Therapeutic exercise   PT Next Visit Plan Progress with ROM; scapular stabilization; postural correction; progress with rehab per MD protocol    Consulted and Agree with Plan of Care Patient;Family member/caregiver   Family Member Consulted daughter, [name]       Patient will benefit from skilled therapeutic intervention in order to improve the following deficits  and impairments:  Postural dysfunction, Improper body mechanics, Pain, Impaired UE functional use, Decreased range of motion, Decreased mobility, Decreased strength, Decreased activity tolerance  Visit Diagnosis: Pain in right shoulder - Plan: PT plan of care cert/re-cert  Muscle weakness (generalized) - Plan: PT plan of care cert/re-cert  Abnormal posture - Plan: PT plan of care cert/re-cert     Problem List Patient Active Problem List   Diagnosis Date Noted  . Paronychia of great toe, left 08/04/2015  . Onychomycosis of right great toe 03/03/2015  . Obesity 01/23/2015  . Right shoulder pain 01/23/2015  . Former smoker 08/28/2014  . Nephrolithiasis 07/25/2014  . Diabetes mellitus, type 2 (HCC) 07/25/2014  . Essential hypertension, benign 07/25/2014  . Annual physical exam 07/25/2014  . Hyperlipidemia 07/25/2014    Breeana Sawtelle Rober Minion PT, MPH  01/12/2016, 1:03 PM  [facility] [address] [place], [place], [address] Phone: [phone]   Fax:  [phone]  Name: Robert Hines MRN: [id number] Date of Birth: 1955/11/10

## 2016-01-14 ENCOUNTER — Ambulatory Visit (INDEPENDENT_AMBULATORY_CARE_PROVIDER_SITE_OTHER): Payer: Managed Care, Other (non HMO) | Admitting: Physical Therapy

## 2016-01-14 DIAGNOSIS — R29898 Other symptoms and signs involving the musculoskeletal system: Secondary | ICD-10-CM | POA: Diagnosis not present

## 2016-01-14 DIAGNOSIS — M6281 Muscle weakness (generalized): Secondary | ICD-10-CM

## 2016-01-14 DIAGNOSIS — M25511 Pain in right shoulder: Secondary | ICD-10-CM

## 2016-01-14 DIAGNOSIS — R293 Abnormal posture: Secondary | ICD-10-CM

## 2016-01-14 NOTE — Therapy (Signed)
[facility] [place] [address] [place], [place], [address] Phone: [phone]   Fax:  [phone]  Physical Therapy Treatment  Patient Details  Name: Robert Hines MRN: [id number] Date of Birth: Oct 22, 1955 Referring Provider: Dr, Jones Broom   Encounter Date: 01/14/2016      PT End of Session - 01/14/16 1707    Visit Number 2   Number of Visits 24   Date for PT Re-Evaluation 03/08/16   PT Start Time 1705   PT Stop Time 1755   PT Time Calculation (min) 50 min   Activity Tolerance Patient tolerated treatment well   Behavior During Therapy [facility] for tasks assessed/performed      Past Medical History:  Diagnosis Date  . Arthritis    back  . Depression   . Diabetes mellitus without complication (HCC)   . Hyperlipidemia   . Hypertension   . Renal disorder    calculi, multiple lithotripsies, stents  . Rotator cuff disorder   . Spinal pain     Past Surgical History:  Procedure Laterality Date  . ANTERIOR FUSION CERVICAL SPINE    . FOOT SURGERY    . HERNIA REPAIR    . LITHOTRIPSY    . SHOULDER ARTHROSCOPY WITH ROTATOR CUFF REPAIR AND SUBACROMIAL DECOMPRESSION Right 12/28/2015   Procedure: SHOULDER ARTHROSCOPY TAKE DOWN ROTATOR CUFF REPAIR AND SUBACROMIAL DECOMPRESSION, DEBRIDEMENT OF SLAP TEAR;  Surgeon: Jones Broom, MD;  Location: [facility];  Service: Orthopedics;  Laterality: Right;  SHOULDER ARTHROSCOPY TAKE DOWN ROTATOR CUFF REPAIR AND SUBACROMIAL DECOMPRESSION  . VASECTOMY Bilateral     There were no vitals filed for this visit.      Subjective Assessment - 01/14/16 1707    Subjective Pt fell off couch last night, hit head and Lt shoulder, had bloody nose and carpet burn. Has been performing HEP.    Currently in Pain? Yes   Pain Score 3    Pain Location Neck   Pain Orientation Right;Left   Aggravating Factors  moving head    Pain Relieving Factors TENS motion.           [facility]  Adult PT Treatment/Exercise - 01/14/16 0001      Exercises   Exercises Hand;Wrist;Elbow;Shoulder     Elbow Exercises   Elbow Flexion AROM;Left;10 reps;Seated  2 sets, 2nd with 1# wt   Wrist Flexion Right;10 reps;Seated;Bar weights/barbell   Bar Weights/Barbell (Wrist Flexion) 2 lbs   Wrist Extension Strengthening;Right;10 reps;Seated;Bar weights/barbell   Bar Weights/Barbell (Wrist Extension) 2 lbs     Shoulder Exercises: Seated   Other Seated Exercises scap squeeze x 5 sec hold x 10 reps      Shoulder Exercises: Standing   Other Standing Exercises pendulum 30 CW/30 CCW     Shoulder Exercises: Stretch   Table Stretch - Flexion 5 reps;10 seconds  VC for Rt arm to remain passive.      Hand Exercises   Other Hand Exercises Red medium strength grip ball:  40 squeezes and 40 finger extension.      Modalities   Modalities Electrical Stimulation;Moist Heat;Vasopneumatic     Moist Heat Therapy   Number Minutes Moist Heat 15 Minutes   Moist Heat Location Cervical     Electrical Stimulation   Electrical Stimulation Location Rt shoulder   Electrical Stimulation Action IFC   Electrical Stimulation Parameters  to tolerance    Electrical Stimulation Goals Pain     Vasopneumatic   Number Minutes Vasopneumatic  15 minutes   Vasopnuematic Location  Shoulder  Right   Vasopneumatic Pressure Low   Vasopneumatic Temperature  3*     Manual Therapy   Manual Therapy Passive ROM   Passive ROM Rt shoulder flexion to 90 deg, IR to chest, and ER in neutral to 10 deg (per protocol)                PT Education - 01/14/16 1749    Education provided Yes   Education Details HEP    Person(s) Educated Patient   Methods Explanation;Handout;Demonstration   Comprehension Returned demonstration;Verbalized understanding          PT Short Term Goals - 01/12/16 1251      PT SHORT TERM GOAL #1   Title Improve posture and alignment with patient engaging posterior shoulder girdle  musculature 02/09/16   Time 6   Period Weeks   Status New     PT SHORT TERM GOAL #2   Title Initiate AAROM/AROM exercises, progressing per protocol 02/09/16   Time 6   Period Weeks   Status New     PT SHORT TERM GOAL #3   Title Education re posture; alignment; progressive exercise program; rehab 02/09/16   Time 6   Period Weeks   Status New           PT Long Term Goals - 01/12/16 1254      PT LONG TERM GOAL #1   Title Improve posture and alignment with patient demonstrating posterior shoulder girlde control for UE function 03/08/16   Time 12   Period Weeks   Status New     PT LONG TERM GOAL #2   Title Improve Rt shoulder ROM to equal or greater than Lt shoulder ROM 03/08/16   Time 12   Period Weeks   Status New     PT LONG TERM GOAL #3   Title Rt shoulder strength =/> 5-/5 without pain 03/08/16   Time 12   Period Weeks   Status New     PT LONG TERM GOAL #4   Title Independent in HEP 03/08/16   Time 12   Period Weeks   Status New     PT LONG TERM GOAL #5   Title improve FOTO =/< 39% impaired 03/08/16   Time 12   Period Weeks   Status New               Plan - 01/14/16 1749    Clinical Impression Statement Pt tolerated most exercises well.  He had some pain and guarding with PROM.  Pt required frequent cues for posture.  Progressing towards goals.    Rehab Potential Good   PT Frequency 2x / week   PT Duration 12 weeks   PT Treatment/Interventions Patient/family education;ADLs/Self Care Home Management;Cryotherapy;Electrical Stimulation;Iontophoresis 4mg /ml Dexamethasone;Moist Heat;Traction;Ultrasound;Manual techniques;Dry needling;Therapeutic activities;Therapeutic exercise   PT Next Visit Plan Progress with ROM; scapular stabilization; postural correction; progress with rehab per MD protocol    Consulted and Agree with Plan of Care Patient   Family Member Consulted daughter, [name]       Patient will benefit from skilled therapeutic intervention in  order to improve the following deficits and impairments:  Postural dysfunction, Improper body mechanics, Pain, Impaired UE functional use, Decreased range of motion, Decreased mobility, Decreased strength, Decreased activity tolerance  Visit Diagnosis: Pain in right shoulder  Muscle weakness (generalized)  Abnormal posture  Weakness of shoulder     Problem List Patient Active Problem List  Diagnosis Date Noted  . Paronychia of great toe, left 08/04/2015  . Onychomycosis of right great toe 03/03/2015  . Obesity 01/23/2015  . Right shoulder pain 01/23/2015  . Former smoker 08/28/2014  . Nephrolithiasis 07/25/2014  . Diabetes mellitus, type 2 (HCC) 07/25/2014  . Essential hypertension, benign 07/25/2014  . Annual physical exam 07/25/2014  . Hyperlipidemia 07/25/2014   Mayer Camel, PTA 01/14/16 5:52 PM  [facility] [place] [address] [place] [address] [place], [place], [address] Phone: [phone]   Fax:  [phone]  Name: Robert Hines MRN: [id number] Date of Birth: 01/19/1956

## 2016-01-14 NOTE — Patient Instructions (Signed)
Pendulum Side to Side  Bend forward 90 at waist, leaning on table for support. Rock body from side to side and let arm swing freely. Repeat _10___ times. Do __3__ sessions per day.  Finger Flexors  Keeping right fingertips straight, press putty toward base of palm. Repeat __20__ times. Do __3__ sessions per day. Activity: Squeeze flour sifter, plastic squeeze bottles, [place] baster, juice from fruit.*  AROM: Elbow Flexion / Extension  With left hand palm up, gently bend elbow as far as possible. Then straighten arm as far as possible. Repeat __10__ times per set. Do __3__ sessions per day.   Posture - Sitting   Sit upright, head facing forward. Try using a roll to support lower back. Keep shoulders relaxed, and avoid rounded back. Keep hips level with knees. Avoid crossing legs for long periods.

## 2016-01-19 ENCOUNTER — Ambulatory Visit (INDEPENDENT_AMBULATORY_CARE_PROVIDER_SITE_OTHER): Payer: Managed Care, Other (non HMO) | Admitting: Rehabilitative and Restorative Service Providers"

## 2016-01-19 ENCOUNTER — Encounter: Payer: Self-pay | Admitting: Rehabilitative and Restorative Service Providers"

## 2016-01-19 DIAGNOSIS — M6281 Muscle weakness (generalized): Secondary | ICD-10-CM

## 2016-01-19 DIAGNOSIS — R293 Abnormal posture: Secondary | ICD-10-CM | POA: Diagnosis not present

## 2016-01-19 DIAGNOSIS — M25511 Pain in right shoulder: Secondary | ICD-10-CM

## 2016-01-19 NOTE — Therapy (Signed)
[facility] [place] [address] [place], [place], [address] Phone: [phone]   Fax:  [phone]  Physical Therapy Treatment  Patient Details  Name: Robert Hines MRN: [id number] Date of Birth: 1955/11/24 Referring Provider: Dr Jones Broom  Encounter Date: 01/19/2016      PT End of Session - 01/19/16 1152    Visit Number 3   Number of Visits 24   Date for PT Re-Evaluation 03/08/16   PT Start Time 1148   PT Stop Time 1245   PT Time Calculation (min) 57 min   Activity Tolerance Patient tolerated treatment well      Past Medical History:  Diagnosis Date  . Arthritis    back  . Depression   . Diabetes mellitus without complication (HCC)   . Hyperlipidemia   . Hypertension   . Renal disorder    calculi, multiple lithotripsies, stents  . Rotator cuff disorder   . Spinal pain     Past Surgical History:  Procedure Laterality Date  . ANTERIOR FUSION CERVICAL SPINE    . FOOT SURGERY    . HERNIA REPAIR    . LITHOTRIPSY    . SHOULDER ARTHROSCOPY WITH ROTATOR CUFF REPAIR AND SUBACROMIAL DECOMPRESSION Right 12/28/2015   Procedure: SHOULDER ARTHROSCOPY TAKE DOWN ROTATOR CUFF REPAIR AND SUBACROMIAL DECOMPRESSION, DEBRIDEMENT OF SLAP TEAR;  Surgeon: Jones Broom, MD;  Location: [facility];  Service: Orthopedics;  Laterality: Right;  SHOULDER ARTHROSCOPY TAKE DOWN ROTATOR CUFF REPAIR AND SUBACROMIAL DECOMPRESSION  . VASECTOMY Bilateral     There were no vitals filed for this visit.      Subjective Assessment - 01/19/16 1154    Subjective Patient reports that he is improving from fall and does not feel that he irritated the Rt shoulder. He is working on exercises at home 2-3 times/day   Currently in Pain? No/denies            [facility] PT Assessment - 01/19/16 0001      Assessment   Medical Diagnosis Rt RCR; DCR; SAD    Referring Provider Dr Jones Broom   Onset Date/Surgical Date 12/28/15   Hand Dominance Right   Next MD Visit 01/26/16   Prior Therapy OPPT prior to sx     PROM   PROM Assessment Site --  PROM per protocol   Right Shoulder Flexion 133 Degrees   Right Shoulder ABduction 90 Degrees   Right Shoulder External Rotation 35 Degrees                     [facility] Adult PT Treatment/Exercise - 01/19/16 0001      Shoulder Exercises: Seated   Other Seated Exercises scap squeeze x 5 sec hold x 10 reps      Shoulder Exercises: Standing   Other Standing Exercises pendulum 30 CW/30 CCW     Shoulder Exercises: Stretch   External Rotation Stretch 5 reps;10 seconds  standing with cane limit to ~10 deg today(protocol to 35 d)    Table Stretch - Flexion 5 reps;10 seconds  VC for Rt arm to remain passive.      Cryotherapy   Number Minutes Cryotherapy 15 Minutes   Cryotherapy Location Shoulder  Rt   Type of Cryotherapy Ice pack     Electrical Stimulation   Electrical Stimulation Location Rt shoulder   Electrical Stimulation Action IFC   Electrical Stimulation Parameters to tolerance   Electrical Stimulation Goals Pain     Manual Therapy  Manual therapy comments pt sitting and supine    Joint Mobilization gentle GH mobs in neutral position;   Soft tissue mobilization Rt shoulder girdle musculature pt supine    Scapular Mobilization Rt - pt supine    Passive ROM Rt shd flex; abd; ER                   PT Short Term Goals - 01/19/16 1246      PT SHORT TERM GOAL #1   Title Improve posture and alignment with patient engaging posterior shoulder girdle musculature 02/09/16   Time 6   Period Weeks   Status On-going     PT SHORT TERM GOAL #2   Title Initiate AAROM/AROM exercises, progressing per protocol 02/09/16   Time 6   Period Weeks   Status On-going     PT SHORT TERM GOAL #3   Title Education re posture; alignment; progressive exercise program; rehab 02/09/16   Time 6   Period Weeks   Status On-going           PT Long Term Goals  - 01/19/16 1246      PT LONG TERM GOAL #1   Title Improve posture and alignment with patient demonstrating posterior shoulder girlde control for UE function 03/08/16   Time 12   Period Weeks   Status On-going     PT LONG TERM GOAL #2   Title Improve Rt shoulder ROM to equal or greater than Lt shoulder ROM 03/08/16   Time 12   Period Weeks   Status On-going     PT LONG TERM GOAL #3   Title Rt shoulder strength =/> 5-/5 without pain 03/08/16   Time 12   Period Weeks   Status On-going     PT LONG TERM GOAL #4   Title Independent in HEP 03/08/16   Time 12   Period Weeks   Status On-going     PT LONG TERM GOAL #5   Title improve FOTO =/< 39% impaired 03/08/16   Time 12   Period Weeks   Status On-going               Plan - 01/19/16 1244    Clinical Impression Statement Good progress with PROM. Needs extensive work on posture and posterior shoulder girdle strength. Working on [employer] with daughter's assistance. Progressing toward stated goals of therapy.    Rehab Potential Good   PT Frequency 2x / week   PT Duration 12 weeks   PT Treatment/Interventions Patient/family education;ADLs/Self Care Home Management;Cryotherapy;Electrical Stimulation;Iontophoresis /ml Dexamethasone;Moist Heat;Traction;Ultrasound;Manual techniques;Dry needling;Therapeutic activities;Therapeutic exercise   PT Next Visit Plan Progress with ROM; scapular stabilization; postural correction; progress with rehab per MD protocol    Consulted and Agree with Plan of Care Patient   Family Member Consulted daughter, [name]       Patient will benefit from skilled therapeutic intervention in order to improve the following deficits and impairments:  Postural dysfunction, Improper body mechanics, Pain, Impaired UE functional use, Decreased range of motion, Decreased mobility, Decreased strength, Decreased activity tolerance  Visit Diagnosis: Pain in right shoulder  Muscle weakness (generalized)  Abnormal  posture     Problem List Patient Active Problem List   Diagnosis Date Noted  . Paronychia of great toe, left 08/04/2015  . Onychomycosis of right great toe 03/03/2015  . Obesity 01/23/2015  . Right shoulder pain 01/23/2015  . Former smoker 08/28/2014  . Nephrolithiasis 07/25/2014  . Diabetes mellitus, type 2 (HCC) 07/25/2014  .  Essential hypertension, benign 07/25/2014  . Annual physical exam 07/25/2014  . Hyperlipidemia 07/25/2014    Dioselina Brumbaugh Rober Minion PT, MPH  01/19/2016, 12:48 PM  [facility] [address] [place], [place], [address] Phone: [phone]   Fax:  [phone]  Name: Robert Hines MRN: [id number] Date of Birth: [birth year]

## 2016-01-21 ENCOUNTER — Ambulatory Visit (INDEPENDENT_AMBULATORY_CARE_PROVIDER_SITE_OTHER): Payer: Managed Care, Other (non HMO) | Admitting: Physical Therapy

## 2016-01-21 DIAGNOSIS — M6281 Muscle weakness (generalized): Secondary | ICD-10-CM

## 2016-01-21 DIAGNOSIS — M25511 Pain in right shoulder: Secondary | ICD-10-CM

## 2016-01-21 DIAGNOSIS — R293 Abnormal posture: Secondary | ICD-10-CM | POA: Diagnosis not present

## 2016-01-21 NOTE — Therapy (Signed)
[facility] [place] [address] [place], [place], [address] Phone: [phone]   Fax:  [phone]  Physical Therapy Treatment  Patient Details  Name: Robert Hines MRN: [id number] Date of Birth: 1956-04-16 Referring Provider: Dr. Jones Broom  Encounter Date: 01/21/2016    Past Medical History:  Diagnosis Date  . Arthritis    back  . Depression   . Diabetes mellitus without complication (HCC)   . Hyperlipidemia   . Hypertension   . Renal disorder    calculi, multiple lithotripsies, stents  . Rotator cuff disorder   . Spinal pain     Past Surgical History:  Procedure Laterality Date  . ANTERIOR FUSION CERVICAL SPINE    . FOOT SURGERY    . HERNIA REPAIR    . LITHOTRIPSY    . SHOULDER ARTHROSCOPY WITH ROTATOR CUFF REPAIR AND SUBACROMIAL DECOMPRESSION Right 12/28/2015   Procedure: SHOULDER ARTHROSCOPY TAKE DOWN ROTATOR CUFF REPAIR AND SUBACROMIAL DECOMPRESSION, DEBRIDEMENT OF SLAP TEAR;  Surgeon: Jones Broom, MD;  [facility];  Service: Orthopedics;  Laterality: Right;  SHOULDER ARTHROSCOPY TAKE DOWN ROTATOR CUFF REPAIR AND SUBACROMIAL DECOMPRESSION  . VASECTOMY Bilateral     There were no vitals filed for this visit.      Subjective Assessment - 01/21/16 1451    Subjective Pt reports he is no longer taking any sleep medication, so he is also no longer falling off couch. He was a little sore after last  sesion.     Currently in Pain? Yes   Pain Score 2    Pain Location Shoulder   Pain Orientation Right;Proximal;Upper            [facility] PT Assessment - 01/21/16 0001      Assessment   Medical Diagnosis Rt RCR; DCR; SAD    Referring Provider Dr. Jones Broom   Onset Date/Surgical Date 12/28/15   Hand Dominance Right   Next MD Visit 01/26/16   Prior Therapy OPPT prior to sx                     [facility] Adult PT Treatment/Exercise - 01/21/16 0001      Self-Care   Self-Care Other Self-Care Comments   Other Self-Care Comments  instructed pt and pt's daughter how to perform scar massage to decrease adhesions and sensitivity.  Both verbalized understanding.      Shoulder Exercises: Seated   Other Seated Exercises scap squeeze x 5 sec hold x 10 reps    Other Seated Exercises shoulder shrugs x 10,  shoulder circles  WCW, CCW x 5 each .      Shoulder Exercises: Standing   Other Standing Exercises pendulum 30 CW/30 CCW       Hand Exercises   Other Hand Exercises Orange Firm ball squeeze and finger extension x 30 reps      Electrical Stimulation   Electrical Stimulation Location Rt shoulder    Electrical Stimulation Action IFC   Electrical Stimulation Parameters to tolerance    Electrical Stimulation Goals Pain     Vasopneumatic   Number Minutes Vasopneumatic  15 minutes   Vasopnuematic Location  Shoulder  Right   Vasopneumatic Pressure Low   Vasopneumatic Temperature  3*     Manual Therapy   Manual Therapy Myofascial release;Joint mobilization;Soft tissue mobilization   Joint Mobilization gentle [place] and GH mobs in neutral position;   Myofascial Release Rt pec release    Scapular Mobilization Rt - pt  supine    Passive ROM Rt shd flex to 90 ; ER to ~10 deg                  PT Short Term Goals - 01/19/16 1246      PT SHORT TERM GOAL #1   Title Improve posture and alignment with patient engaging posterior shoulder girdle musculature 02/09/16   Time 6   Period Weeks   Status On-going     PT SHORT TERM GOAL #2   Title Initiate AAROM/AROM exercises, progressing per protocol 02/09/16   Time 6   Period Weeks   Status On-going     PT SHORT TERM GOAL #3   Title Education re posture; alignment; progressive exercise program; rehab 02/09/16   Time 6   Period Weeks   Status On-going           PT Long Term Goals - 01/19/16 1246      PT LONG TERM GOAL #1   Title Improve posture and alignment with patient demonstrating posterior  shoulder girlde control for UE function 03/08/16   Time 12   Period Weeks   Status On-going     PT LONG TERM GOAL #2   Title Improve Rt shoulder ROM to equal or greater than Lt shoulder ROM 03/08/16   Time 12   Period Weeks   Status On-going     PT LONG TERM GOAL #3   Title Rt shoulder strength =/> 5-/5 without pain 03/08/16   Time 12   Period Weeks   Status On-going     PT LONG TERM GOAL #4   Title Independent in HEP 03/08/16   Time 12   Period Weeks   Status On-going     PT LONG TERM GOAL #5   Title improve FOTO =/< 39% impaired 03/08/16   Time 12   Period Weeks   Status On-going               Plan - 01/21/16 0754    Clinical Impression Statement Pt less guarded with PROM.  Progressing well towards stated goals within MD's protocol.    Rehab Potential Good   PT Frequency 2x / week   PT Duration 12 weeks   PT Treatment/Interventions Patient/family education;ADLs/Self Care Home Management;Cryotherapy;Electrical Stimulation;Iontophoresis /ml Dexamethasone;Moist Heat;Traction;Ultrasound;Manual techniques;Dry needling;Therapeutic activities;Therapeutic exercise   PT Next Visit Plan Progress with ROM; scapular stabilization; postural correction; progress with rehab per MD protocol    Consulted and Agree with Plan of Care Patient   Family Member Consulted daughter, [name]       Patient will benefit from skilled therapeutic intervention in order to improve the following deficits and impairments:  Postural dysfunction, Improper body mechanics, Pain, Impaired UE functional use, Decreased range of motion, Decreased mobility, Decreased strength, Decreased activity tolerance  Visit Diagnosis: Pain in right shoulder  Muscle weakness (generalized)  Abnormal posture     Problem List Patient Active Problem List   Diagnosis Date Noted  . Paronychia of great toe, left 08/04/2015  . Onychomycosis of right great toe 03/03/2015  . Obesity 01/23/2015  . Right shoulder pain  01/23/2015  . Former smoker 08/28/2014  . Nephrolithiasis 07/25/2014  . Diabetes mellitus, type 2 (HCC) 07/25/2014  . Essential hypertension, benign 07/25/2014  . Annual physical exam 07/25/2014  . Hyperlipidemia 07/25/2014   Mayer Camel, PTA 01/22/16 7:55 AM  [facility] [address] [place] [place], [place], [address] Phone: [phone]   Fax:  [phone]  Name: Robert Hines MRN: [id number] Date of Birth: Dec 30, 1955

## 2016-01-25 ENCOUNTER — Encounter: Payer: Self-pay | Admitting: Physical Therapy

## 2016-01-25 ENCOUNTER — Ambulatory Visit (INDEPENDENT_AMBULATORY_CARE_PROVIDER_SITE_OTHER): Payer: Managed Care, Other (non HMO) | Admitting: Physical Therapy

## 2016-01-25 DIAGNOSIS — M6281 Muscle weakness (generalized): Secondary | ICD-10-CM | POA: Diagnosis not present

## 2016-01-25 DIAGNOSIS — M25511 Pain in right shoulder: Secondary | ICD-10-CM

## 2016-01-25 DIAGNOSIS — R293 Abnormal posture: Secondary | ICD-10-CM | POA: Diagnosis not present

## 2016-01-25 NOTE — Therapy (Signed)
[facility] [address] [place], [place], [address] Phone: [phone]   Fax:  [phone]  Physical Therapy Treatment  Patient Details  Name: Robert BertinGary E Hines MRN: [id number] Date of Birth: 05-24-1956 Referring Provider: Dr Jones BroomJustin Chandler  Encounter Date: 01/25/2016      PT End of Session - 01/25/16 1425    Visit Number 4   Number of Visits 24   Date for PT Re-Evaluation 03/08/16   PT Start Time 1426   PT Stop Time 1528   PT Time Calculation (min) 62 min   Activity Tolerance Patient tolerated treatment well      Past Medical History:  Diagnosis Date  . Arthritis    back  . Depression   . Diabetes mellitus without complication (HCC)   . Hyperlipidemia   . Hypertension   . Renal disorder    calculi, multiple lithotripsies, stents  . Rotator cuff disorder   . Spinal pain     Past Surgical History:  Procedure Laterality Date  . ANTERIOR FUSION CERVICAL SPINE    . FOOT SURGERY    . HERNIA REPAIR    . LITHOTRIPSY    . SHOULDER ARTHROSCOPY WITH ROTATOR CUFF REPAIR AND SUBACROMIAL DECOMPRESSION Right 12/28/2015   Procedure: SHOULDER ARTHROSCOPY TAKE DOWN ROTATOR CUFF REPAIR AND SUBACROMIAL DECOMPRESSION, DEBRIDEMENT OF SLAP TEAR;  Surgeon: Jones BroomJustin Chandler, MD;  Location: [facility];  Service: Orthopedics;  Laterality: Right;  SHOULDER ARTHROSCOPY TAKE DOWN ROTATOR CUFF REPAIR AND SUBACROMIAL DECOMPRESSION  . VASECTOMY Bilateral     There were no vitals filed for this visit.      Subjective Assessment - 01/25/16 1425    Subjective Robert HiddenGary reports he still has pain when he moves the shoulder, sees MD on Wed   Currently in Pain? No/denies  at rest            [facility] PT Assessment - 01/25/16 0001      Assessment   Medical Diagnosis Rt RCR; DCR; SAD    Referring Provider Dr Jones BroomJustin Chandler   Onset Date/Surgical Date 12/28/15   Hand Dominance Right   Next MD Visit 01/26/16     PROM   PROM  Assessment Site Shoulder   Right/Left Shoulder Right   Right Shoulder Flexion 92 Degrees  124 after stretching   Right Shoulder ABduction 95 Degrees  108 after stretching   Right Shoulder Internal Rotation 60 Degrees   Right Shoulder External Rotation 26 Degrees  45 after stretching     Strength   Overall Strength Comments NT due to surgery and not approved time per protocol                     [facility] Treatment/Exercise - 01/25/16 0001      Elbow Exercises   Elbow Flexion Strengthening;Right;Seated  3x10 with 2#   Wrist Flexion Strengthening;Right;Seated  3x10, 2#   Wrist Extension Strengthening;Right;Seated  3x10, 2#   Other elbow exercises 30 reps supination/pronation with 2#     Shoulder Exercises: Supine   Other Supine Exercises 30 reps shoulder presses into mat     Shoulder Exercises: Standing   Other Standing Exercises shrugs x 30 with 2#     Shoulder Exercises: Stretch   Other Shoulder Stretches cervical sidebend stretch with over pressure.      Modalities   Modalities Chartered certified accountantlectrical Stimulation;Vasopneumatic     Electrical Stimulation   Electrical Stimulation Location Rt shoulder    Electrical Stimulation  Action IFC   Electrical Stimulation Parameters to tolerance   Electrical Stimulation Goals Pain     Vasopneumatic   Number Minutes Vasopneumatic  15 minutes   Vasopnuematic Location  Shoulder   Vasopneumatic Pressure Low   Vasopneumatic Temperature  3*     Manual Therapy   Manual Therapy Passive ROM   Joint Mobilization Rt GH jt   Passive ROM Rt shoulder flex, IR, ER, Abduct                   PT Short Term Goals - 01/25/16 1444      PT SHORT TERM GOAL #1   Title Improve posture and alignment with patient engaging posterior shoulder girdle musculature 02/09/16   Time 6   Period Weeks   Status On-going     PT SHORT TERM GOAL #2   Title Initiate AAROM/AROM exercises, progressing per protocol 02/09/16   Status On-going      PT SHORT TERM GOAL #3   Title Education re posture; alignment; progressive exercise program; rehab 02/09/16   Period Weeks   Status On-going           PT Long Term Goals - 01/25/16 1444      PT LONG TERM GOAL #1   Title Improve posture and alignment with patient demonstrating posterior shoulder girlde control for UE function 03/08/16   Time 12   Period Weeks   Status On-going     PT LONG TERM GOAL #2   Title Improve Rt shoulder ROM to equal or greater than Lt shoulder ROM 03/08/16   Time 12   Period Weeks   Status On-going     PT LONG TERM GOAL #3   Title Rt shoulder strength =/> 5-/5 without pain 03/08/16   Time 12   Period Weeks   Status On-going     PT LONG TERM GOAL #4   Title Independent in HEP 03/08/16   Time 12   Period Weeks   Status On-going     PT LONG TERM GOAL #5   Title improve FOTO =/< 39% impaired 03/08/16   Time 12   Period Weeks   Status On-going               Plan - 01/25/16 1515    Clinical Impression Statement Robert Hines is making good progress within protocol.  His shoulder loosens up nicely with PROM.  Progressing to his goals. He is having pain into his Rt upper trap/neck and my benefit from modalities to this area if it doesn't calm down.    Rehab Potential Good   PT Frequency 2x / week   PT Duration 12 weeks   PT Treatment/Interventions Patient/family education;ADLs/Self Care Home Management;Cryotherapy;Electrical Stimulation;Iontophoresis /ml Dexamethasone;Moist Heat;Traction;Ultrasound;Manual techniques;Dry needling;Therapeutic activities;Therapeutic exercise   PT Next Visit Plan Progress with ROM; scapular stabilization; postural correction; progress with rehab per MD protocol    Consulted and Agree with Plan of Care Patient      Patient will benefit from skilled therapeutic intervention in order to improve the following deficits and impairments:  Postural dysfunction, Improper body mechanics, Pain, Impaired UE functional use,  Decreased range of motion, Decreased mobility, Decreased strength, Decreased activity tolerance  Visit Diagnosis: Pain in right shoulder  Muscle weakness (generalized)  Abnormal posture     Problem List Patient Active Problem List   Diagnosis Date Noted  . Paronychia of great toe, left 08/04/2015  . Onychomycosis of right great toe 03/03/2015  . Obesity 01/23/2015  .  Right shoulder pain 01/23/2015  . Former smoker 08/28/2014  . Nephrolithiasis 07/25/2014  . Diabetes mellitus, type 2 (HCC) 07/25/2014  . Essential hypertension, benign 07/25/2014  . Annual physical exam 07/25/2014  . Hyperlipidemia 07/25/2014    Roderic Scarce PT  01/25/2016, 3:18 PM  [facility] [address] [place], [place], [address] Phone: [phone]   Fax:  [phone]  Name: YEHOSHUA VITELLI MRN: [id number] Date of Birth: 1956/02/14

## 2016-01-28 ENCOUNTER — Ambulatory Visit (INDEPENDENT_AMBULATORY_CARE_PROVIDER_SITE_OTHER): Payer: Managed Care, Other (non HMO) | Admitting: Physical Therapy

## 2016-01-28 DIAGNOSIS — R29898 Other symptoms and signs involving the musculoskeletal system: Secondary | ICD-10-CM

## 2016-01-28 DIAGNOSIS — M6281 Muscle weakness (generalized): Secondary | ICD-10-CM

## 2016-01-28 DIAGNOSIS — R293 Abnormal posture: Secondary | ICD-10-CM

## 2016-01-28 DIAGNOSIS — M25511 Pain in right shoulder: Secondary | ICD-10-CM

## 2016-01-28 NOTE — Therapy (Signed)
[facility] [address] [place], [place], [address] Phone: [phone]   Fax:  [phone]  Physical Therapy Treatment  Patient Details  Name: Robert Hines MRN: [id number] Date of Birth: [birth year] Referring Provider: Dr. Jones Broom   Encounter Date: 01/28/2016      PT End of Session - 01/28/16 1532    Visit Number 5   Number of Visits 24   Date for PT Re-Evaluation 03/08/16   PT Start Time 1447   PT Stop Time 1546   PT Time Calculation (min) 59 min      Past Medical History:  Diagnosis Date  . Arthritis    back  . Depression   . Diabetes mellitus without complication (HCC)   . Hyperlipidemia   . Hypertension   . Renal disorder    calculi, multiple lithotripsies, stents  . Rotator cuff disorder   . Spinal pain     Past Surgical History:  Procedure Laterality Date  . ANTERIOR FUSION CERVICAL SPINE    . FOOT SURGERY    . HERNIA REPAIR    . LITHOTRIPSY    . SHOULDER ARTHROSCOPY WITH ROTATOR CUFF REPAIR AND SUBACROMIAL DECOMPRESSION Right 12/28/2015   Procedure: SHOULDER ARTHROSCOPY TAKE DOWN ROTATOR CUFF REPAIR AND SUBACROMIAL DECOMPRESSION, DEBRIDEMENT OF SLAP TEAR;  Surgeon: Jones Broom, MD;  Location: [facility];  Service: Orthopedics;  Laterality: Right;  SHOULDER ARTHROSCOPY TAKE DOWN ROTATOR CUFF REPAIR AND SUBACROMIAL DECOMPRESSION  . VASECTOMY Bilateral     There were no vitals filed for this visit.      Subjective Assessment - 01/28/16 1451    Subjective Pt reports he went to MD.  They were pleased.  Per pt, he is to wean out of sling at home, but needs to wear in community.  Can drive with LUE.  Continue MD precautions.   Pt reports he slept in his bed for the first time since surgery.    Patient Stated Goals use shoulder again without pain   Currently in Pain? No/denies            [facility] PT Assessment - 01/28/16 0001      Assessment   Medical Diagnosis Rt  RCR; DCR; SAD    Referring Provider Dr. Jones Broom    Onset Date/Surgical Date 12/28/15   Hand Dominance Right   Next MD Visit 03/09/16                     [facility] Adult PT Treatment/Exercise - 01/28/16 0001      Shoulder Exercises: Supine   Other Supine Exercises 30 reps shoulder presses into mat, 5 sec holds     Shoulder Exercises: Standing   Other Standing Exercises pendulum 30 CW/30 CCW     Vasopneumatic   Number Minutes Vasopneumatic  15 minutes   Vasopnuematic Location  Shoulder  Rt   Vasopneumatic Pressure Low   Vasopneumatic Temperature  3*     Manual Therapy   Manual Therapy Myofascial release;Scapular mobilization;Passive ROM   Joint Mobilization Gentle Rt GH mobs in neutral    Myofascial Release Rt pec release; Rt platysma with active rotation of cervical spine.    Scapular Mobilization Pt in sidelying; all directions. Some guarding at times.  Pin and stretch of levator with mobilization    Passive ROM Rt shoulder flex, IR, ER, scaption, ext (within guidelines)            PT Short Term Goals - 01/25/16  1444      PT SHORT TERM GOAL #1   Title Improve posture and alignment with patient engaging posterior shoulder girdle musculature 02/09/16   Time 6   Period Weeks   Status On-going     PT SHORT TERM GOAL #2   Title Initiate AAROM/AROM exercises, progressing per protocol 02/09/16   Status On-going     PT SHORT TERM GOAL #3   Title Education re posture; alignment; progressive exercise program; rehab 02/09/16   Period Weeks   Status On-going           PT Long Term Goals - 01/25/16 1444      PT LONG TERM GOAL #1   Title Improve posture and alignment with patient demonstrating posterior shoulder girlde control for UE function 03/08/16   Time 12   Period Weeks   Status On-going     PT LONG TERM GOAL #2   Title Improve Rt shoulder ROM to equal or greater than Lt shoulder ROM 03/08/16   Time 12   Period Weeks   Status On-going     PT  LONG TERM GOAL #3   Title Rt shoulder strength =/> 5-/5 without pain 03/08/16   Time 12   Period Weeks   Status On-going     PT LONG TERM GOAL #4   Title Independent in HEP 03/08/16   Time 12   Period Weeks   Status On-going     PT LONG TERM GOAL #5   Title improve FOTO =/< 39% impaired 03/08/16   Time 12   Period Weeks   Status On-going               Plan - 01/28/16 1655    Clinical Impression Statement Less guarding with manual therapy today. Pt reported decreased tightness in pec and neck after manual therapy. Decreased shoulder pain after use of estim/ vaso.  Progressing towards established goals.    Rehab Potential Good   PT Frequency 2x / week   PT Duration 12 weeks   PT Treatment/Interventions Patient/family education;ADLs/Self Care Home Management;Cryotherapy;Electrical Stimulation;Iontophoresis 4mg /ml Dexamethasone;Moist Heat;Traction;Ultrasound;Manual techniques;Dry needling;Therapeutic activities;Therapeutic exercise   PT Next Visit Plan Progress with ROM; scapular stabilization; postural correction; progress with rehab per MD protocol    Consulted and Agree with Plan of Care Patient      Patient will benefit from skilled therapeutic intervention in order to improve the following deficits and impairments:  Postural dysfunction, Improper body mechanics, Pain, Impaired UE functional use, Decreased range of motion, Decreased mobility, Decreased strength, Decreased activity tolerance  Visit Diagnosis: Pain in right shoulder  Muscle weakness (generalized)  Abnormal posture  Weakness of shoulder     Problem List Patient Active Problem List   Diagnosis Date Noted  . Paronychia of great toe, left 08/04/2015  . Onychomycosis of right great toe 03/03/2015  . Obesity 01/23/2015  . Right shoulder pain 01/23/2015  . Former smoker 08/28/2014  . Nephrolithiasis 07/25/2014  . Diabetes mellitus, type 2 (HCC) 07/25/2014  . Essential hypertension, benign 07/25/2014   . Annual physical exam 07/25/2014  . Hyperlipidemia 07/25/2014   Mayer CamelJennifer Carlson-Long, PTA 01/28/16 4:56 PM [facility] [address] [place], [place], [address] Phone: [phone]   Fax:  [phone]  Name: Robert Hines MRN: [id number] Date of Birth: [birth year]

## 2016-02-01 ENCOUNTER — Ambulatory Visit (INDEPENDENT_AMBULATORY_CARE_PROVIDER_SITE_OTHER): Payer: Managed Care, Other (non HMO) | Admitting: Physical Therapy

## 2016-02-01 DIAGNOSIS — M25511 Pain in right shoulder: Secondary | ICD-10-CM

## 2016-02-01 DIAGNOSIS — M6281 Muscle weakness (generalized): Secondary | ICD-10-CM | POA: Diagnosis not present

## 2016-02-01 DIAGNOSIS — R293 Abnormal posture: Secondary | ICD-10-CM | POA: Diagnosis not present

## 2016-02-01 DIAGNOSIS — R29898 Other symptoms and signs involving the musculoskeletal system: Secondary | ICD-10-CM

## 2016-02-01 NOTE — Therapy (Signed)
[place], [place], [address] Phone: [phone]   Fax:  [phone]  Physical Therapy Treatment  Patient Details  Name: Robert Hines MRN: [id number] Date of Birth: 12/21/1955 Referring Provider: Dr. Tania Ade   Encounter Date: 02/01/2016      PT End of Session - 02/01/16 1437    Visit Number 6   Number of Visits 24   Date for PT Re-Evaluation 03/08/16   PT Start Time 1435   PT Stop Time 1530   PT Time Calculation (min) 55 min   Activity Tolerance Patient tolerated treatment well      Past Medical History:  Diagnosis Date  . Arthritis    back  . Depression   . Diabetes mellitus without complication ([place])   . Hyperlipidemia   . Hypertension   . Renal disorder    calculi, multiple lithotripsies, stents  . Rotator cuff disorder   . Spinal pain     Past Surgical History:  Procedure Laterality Date  . ANTERIOR FUSION CERVICAL SPINE    . FOOT SURGERY    . HERNIA REPAIR    . LITHOTRIPSY    . SHOULDER ARTHROSCOPY WITH ROTATOR CUFF REPAIR AND SUBACROMIAL DECOMPRESSION Right 12/28/2015   Procedure: SHOULDER ARTHROSCOPY TAKE DOWN ROTATOR CUFF REPAIR AND SUBACROMIAL DECOMPRESSION, DEBRIDEMENT OF SLAP TEAR;  Surgeon: Tania Ade, MD;  Location: [facility];  Service: Orthopedics;  Laterality: Right;  SHOULDER ARTHROSCOPY TAKE DOWN ROTATOR CUFF REPAIR AND SUBACROMIAL DECOMPRESSION  . VASECTOMY Bilateral     There were no vitals filed for this visit.      Subjective Assessment - 02/01/16 1437    Subjective Pt states he has a cold, feeling under the weather.  Pt reports no new changes since last visit.  "Shoulder is feeling better every day".    Patient is accompained by: Family member   Currently in Pain? No/denies            [facility] PT Assessment - 02/01/16 0001      Assessment   Medical Diagnosis Rt RCR; DCR; SAD    Referring Provider Dr. Tania Ade     Onset Date/Surgical Date 12/28/15   Hand Dominance Right   Next MD Visit 03/09/16                     [facility] Adult PT Treatment/Exercise - 02/01/16 0001      Shoulder Exercises: Supine   External Rotation AAROM;Right;10 reps  with cane   Flexion AAROM;Right;15 reps  cane    Other Supine Exercises Press up with cane (AAROM) x 15 reps      Shoulder Exercises: Standing   ABduction Right;AROM  3 reps, scaption with scap squeeze.    ABduction Limitations stopped, pt nauseous/sweating. (limited to ~30 deg)    Extension Strengthening;Right;10 reps;Theraband  2 sets   Theraband Level (Shoulder Extension) Level 1 (Yellow)   Row Strengthening;Right;10 reps;Theraband  2 sets, tactile cues for form   Theraband Level (Shoulder Row) Level 1 (Yellow)   Other Standing Exercises pendulum 30 CW/30 CCW     Shoulder Exercises: Stretch   Other Shoulder Stretches Rt shoulder IR AAROM, with opp hand then strap (behind back), x 10 sec x 6 reps to tolerance.       Film/video editor IFC   Electrical Stimulation Parameters to tolerance    Electrical Stimulation Goals  Pain     Vasopneumatic   Number Minutes Vasopneumatic  15 minutes   Vasopnuematic Location  Shoulder   Vasopneumatic Pressure Low   Vasopneumatic Temperature  3*      Manual Therapy   Manual Therapy Myofascial release;Passive ROM   Myofascial Release Rt pec release.    Passive ROM Rt shoulder Flexion, scaption, ER.                   PT Short Term Goals - 02/01/16 1528      PT SHORT TERM GOAL #1   Title Improve posture and alignment with patient engaging posterior shoulder girdle musculature 02/09/16   Time 6   Period Weeks   Status On-going     PT SHORT TERM GOAL #2   Title Initiate AAROM/AROM exercises, progressing per protocol 02/09/16   Time 6   Period Weeks   Status Achieved     PT SHORT TERM GOAL #3   Title  Education re posture; alignment; progressive exercise program; rehab 02/09/16   Time 6   Period Weeks   Status On-going           PT Long Term Goals - 01/25/16 1444      PT LONG TERM GOAL #1   Title Improve posture and alignment with patient demonstrating posterior shoulder girlde control for UE function 03/08/16   Time 12   Period Weeks   Status On-going     PT LONG TERM GOAL #2   Title Improve Rt shoulder ROM to equal or greater than Lt shoulder ROM 03/08/16   Time 12   Period Weeks   Status On-going     PT LONG TERM GOAL #3   Title Rt shoulder strength =/> 5-/5 without pain 03/08/16   Time 12   Period Weeks   Status On-going     PT LONG TERM GOAL #4   Title Independent in HEP 03/08/16   Time 12   Period Weeks   Status On-going     PT LONG TERM GOAL #5   Title improve FOTO =/< 39% impaired 03/08/16   Time 12   Period Weeks   Status On-going               Plan - 02/01/16 1516    Clinical Impression Statement Pt is now 6 wks post-op and is progressing to AAROM/AROM per protocol. Pt tolerated all exercises well until AAROM for Rt shoulder IR; pt became nauseous, and activity was stopped.  Pt has less guarding with manual therapy.  Pt has met STG #2 and is progressing towards goals.    Rehab Potential Good   PT Frequency 2x / week   PT Duration 12 weeks   PT Treatment/Interventions Patient/family education;ADLs/Self Care Home Management;Cryotherapy;Electrical Stimulation;Iontophoresis 5m/ml Dexamethasone;Moist Heat;Traction;Ultrasound;Manual techniques;Dry needling;Therapeutic activities;Therapeutic exercise   PT Next Visit Plan Progress with ROM; scapular stabilization; postural correction; progress with rehab per MD protocol    Consulted and Agree with Plan of Care Patient;Family member/caregiver   Family Member Consulted daughter, CAngela Nevin      Patient will benefit from skilled therapeutic intervention in order to improve the following deficits and  impairments:  Postural dysfunction, Improper body mechanics, Pain, Impaired UE functional use, Decreased range of motion, Decreased mobility, Decreased strength, Decreased activity tolerance  Visit Diagnosis: Pain in right shoulder  Muscle weakness (generalized)  Abnormal posture  Weakness of shoulder     Problem List Patient Active Problem List   Diagnosis Date Noted  .  Paronychia of great toe, left 08/04/2015  . Onychomycosis of right great toe 03/03/2015  . Obesity 01/23/2015  . Right shoulder pain 01/23/2015  . Former smoker 08/28/2014  . Nephrolithiasis 07/25/2014  . Diabetes mellitus, type 2 ([place]) 07/25/2014  . Essential hypertension, benign 07/25/2014  . Annual physical exam 07/25/2014  . Hyperlipidemia 07/25/2014   Kerin Perna, PTA 02/01/16 3:28 PM  [place], [place], [address] Phone: [phone]   Fax:  [phone]  Name: TAVI HOOGENDOORN MRN: [id number] Date of Birth: Jan 18, 1956

## 2016-02-01 NOTE — Patient Instructions (Signed)
Cane Exercise: Flexion    Lie on back, holding cane above chest. Keeping arms as straight as possible, lower cane toward floor beyond head. Hold __2__ seconds. Repeat _10___ times. Do __1-2__ sessions per day.  SELF ASSISTED WITH OBJECT: Shoulder External Rotation - Supine    Hold cane with both hands, keep elbow bent and next to body. Rotate arm away from body. _10__ reps per set, _1-2__ sets per day, __5_ days per week Add towel to keep elbow at side.   Low Row: Standing   Face anchor, feet shoulder width apart. Palms up, pull arms back, squeezing shoulder blades together. Repeat 10__ times per set. Do 2-3__ sets per session. Do 2-3__ sessions per week. Anchor Height: Waist  Strengthening: Resisted Extension   Hold tubing in right hand, arm forward. Pull arm back, elbow straight. Repeat _10___ times per set. Do 2-3____ sets per session. Do 2-3____ sessions per day.  Internal Rotation (Passive)    Bring hand behind back, using other hand to assist. Hold __5-10__ seconds. Repeat __5__ times. Do _1-2___ sessions per day. Robert Hines.   [facility] [address] [place], [place] [address]  [phone] (office) [phone] (fax)

## 2016-02-04 ENCOUNTER — Ambulatory Visit (INDEPENDENT_AMBULATORY_CARE_PROVIDER_SITE_OTHER): Payer: Managed Care, Other (non HMO) | Admitting: Physical Therapy

## 2016-02-04 DIAGNOSIS — M6281 Muscle weakness (generalized): Secondary | ICD-10-CM

## 2016-02-04 DIAGNOSIS — R293 Abnormal posture: Secondary | ICD-10-CM | POA: Diagnosis not present

## 2016-02-04 DIAGNOSIS — M25511 Pain in right shoulder: Secondary | ICD-10-CM | POA: Diagnosis not present

## 2016-02-04 DIAGNOSIS — R29898 Other symptoms and signs involving the musculoskeletal system: Secondary | ICD-10-CM

## 2016-02-04 NOTE — Therapy (Signed)
[facility] [address] [place], [place], [address] Phone: [phone]   Fax:  [phone]  Physical Therapy Treatment  Patient Details  Name: Robert BertinGary E Hines MRN: [id number] Date of Birth: [birth year] Referring Provider: Dr. Jones BroomJustin Chandler   Encounter Date: 02/04/2016      PT End of Session - 02/04/16 1455    Visit Number 7   Number of Visits 24   Date for PT Re-Evaluation 03/08/16   PT Start Time 1449   PT Stop Time 1542   PT Time Calculation (min) 53 min   Activity Tolerance Patient tolerated treatment well      Past Medical History:  Diagnosis Date  . Arthritis    back  . Depression   . Diabetes mellitus without complication (HCC)   . Hyperlipidemia   . Hypertension   . Renal disorder    calculi, multiple lithotripsies, stents  . Rotator cuff disorder   . Spinal pain     Past Surgical History:  Procedure Laterality Date  . ANTERIOR FUSION CERVICAL SPINE    . FOOT SURGERY    . HERNIA REPAIR    . LITHOTRIPSY    . SHOULDER ARTHROSCOPY WITH ROTATOR CUFF REPAIR AND SUBACROMIAL DECOMPRESSION Right 12/28/2015   Procedure: SHOULDER ARTHROSCOPY TAKE DOWN ROTATOR CUFF REPAIR AND SUBACROMIAL DECOMPRESSION, DEBRIDEMENT OF SLAP TEAR;  Surgeon: Jones BroomJustin Chandler, MD;  Location: [facility];  Service: Orthopedics;  Laterality: Right;  SHOULDER ARTHROSCOPY TAKE DOWN ROTATOR CUFF REPAIR AND SUBACROMIAL DECOMPRESSION  . VASECTOMY Bilateral     There were no vitals filed for this visit.      Subjective Assessment - 02/04/16 1457    Subjective Pt's daughter notes she sees a lot of improvement in her father's ROM.  Pt states, "I've been working it (Rt shoulder)".    Patient is accompained by: Family member   Currently in Pain? No/denies  0/10 at rest            [facility] - 02/04/16 0001      Assessment   Medical Diagnosis Rt RCR; DCR; SAD    Referring Provider Dr. Jones BroomJustin Chandler    Onset Date/Surgical Date 12/28/15   Hand Dominance Right   Next MD Visit 03/09/16     AROM   Right/Left Shoulder Right   Right Shoulder Flexion 100 Degrees  standing   Right Shoulder ABduction 109 Degrees  standing           [facility] Adult PT Treatment/Exercise - 02/04/16 0001      Shoulder Exercises: Supine   External Rotation AAROM;Right;10 reps  with cane   Flexion AAROM;Right;10 reps  cane      Shoulder Exercises: Standing   Extension Strengthening;Both;10 reps;Theraband   Theraband Level (Shoulder Extension) Level 1 (Yellow)   Row Strengthening;Both;10 reps;Theraband   Theraband Level (Shoulder Row) Level 1 (Yellow)   Other Standing Exercises Wall ladder flexion RUE x 5 reps    Other Standing Exercises pendulum 30 CW/30 CCW     Shoulder Exercises: Pulleys   Flexion 2 minutes   ABduction 2 minutes     Shoulder Exercises: Isometric Strengthening   Flexion --  5 sec x 10 reps RUE   Extension --  5 sec x 10 reps RUE   External Rotation --  5 sec x 10 reps RUE   Internal Rotation --  5 sec x 10 reps RUE   ABduction --  5 sec x 10 reps RUE  Financial risk analystlectrical Stimulation   Electrical Stimulation Location Rt shoulder    Electrical Stimulation Action IFC   Electrical Stimulation Parameters to tolerance    Electrical Stimulation Goals Pain     Vasopneumatic   Number Minutes Vasopneumatic  15 minutes   Vasopnuematic Location  Shoulder   Vasopneumatic Pressure Low   Vasopneumatic Temperature  3*                 PT Education - 02/04/16 1509    Education provided Yes   Education Details HEP - added isometrics    Person(s) Educated Patient   Methods Explanation;Handout   Comprehension Verbalized understanding;Returned demonstration          PT Short Term Goals - 02/01/16 1528      PT SHORT TERM GOAL #1   Title Improve posture and alignment with patient engaging posterior shoulder girdle musculature 02/09/16   Time 6   Period Weeks   Status On-going      PT SHORT TERM GOAL #2   Title Initiate AAROM/AROM exercises, progressing per protocol 02/09/16   Time 6   Period Weeks   Status Achieved     PT SHORT TERM GOAL #3   Title Education re posture; alignment; progressive exercise program; rehab 02/09/16   Time 6   Period Weeks   Status On-going           PT Long Term Goals - 01/25/16 1444      PT LONG TERM GOAL #1   Title Improve posture and alignment with patient demonstrating posterior shoulder girlde control for UE function 03/08/16   Time 12   Period Weeks   Status On-going     PT LONG TERM GOAL #2   Title Improve Rt shoulder ROM to equal or greater than Lt shoulder ROM 03/08/16   Time 12   Period Weeks   Status On-going     PT LONG TERM GOAL #3   Title Rt shoulder strength =/> 5-/5 without pain 03/08/16   Time 12   Period Weeks   Status On-going     PT LONG TERM GOAL #4   Title Independent in HEP 03/08/16   Time 12   Period Weeks   Status On-going     PT LONG TERM GOAL #5   Title improve FOTO =/< 39% impaired 03/08/16   Time 12   Period Weeks   Status On-going               Plan - 02/04/16 1526    Clinical Impression Statement Pt tolerated new exercises well, with minimal increase in pain (up to 4/10 during exercise).  New exercises added to HEP.    Rehab Potential Good   PT Frequency 2x / week   PT Duration 12 weeks   PT Treatment/Interventions Patient/family education;ADLs/Self Care Home Management;Cryotherapy;Electrical Stimulation;Iontophoresis 4mg /ml Dexamethasone;Moist Heat;Traction;Ultrasound;Manual techniques;Dry needling;Therapeutic activities;Therapeutic exercise   PT Next Visit Plan Progress with ROM; scapular stabilization; postural correction; progress with rehab per MD protocol    Consulted and Agree with Plan of Care Patient   Family Member Consulted daughter, Albin FellingCarla       Patient will benefit from skilled therapeutic intervention in order to improve the following deficits and  impairments:  Postural dysfunction, Improper body mechanics, Pain, Impaired UE functional use, Decreased range of motion, Decreased mobility, Decreased strength, Decreased activity tolerance  Visit Diagnosis: Pain in right shoulder  Muscle weakness (generalized)  Abnormal posture  Weakness of shoulder     Problem List  Patient Active Problem List   Diagnosis Date Noted  . Paronychia of great toe, left 08/04/2015  . Onychomycosis of right great toe 03/03/2015  . Obesity 01/23/2015  . Right shoulder pain 01/23/2015  . Former smoker 08/28/2014  . Nephrolithiasis 07/25/2014  . Diabetes mellitus, type 2 (HCC) 07/25/2014  . Essential hypertension, benign 07/25/2014  . Annual physical exam 07/25/2014  . Hyperlipidemia 07/25/2014   Mayer Camel, PTA 02/04/16 3:27 PM  [facility] [place] [address] [place], [place], [address] Phone: [phone]   Fax:  [phone]  Name: HOMMER CUNLIFFE MRN: [id number] Date of Birth: 1955-10-29

## 2016-02-04 NOTE — Patient Instructions (Signed)
Strengthening: Isometric Flexion  Using wall for resistance, press right fist into ball using light pressure. Hold __5 __ seconds. Repeat __10__ times per set. Do __1__ sets per session. Do _2___ sessions per day.  SHOULDER: Abduction (Isometric)  Use wall as resistance. Press arm against pillow. Keep elbow straight. Hold __5_ seconds. __10_ reps per set, __2_ sets per day  Extension (Isometric)  Place left bent elbow and back of arm against wall. Press elbow against wall. Hold _5___ seconds. Repeat __10__ times. Do __2__ sessions per day.  Internal Rotation (Isometric)  Place palm of right fist against door frame, with elbow bent. Press fist against door frame. Hold __5__ seconds. Repeat __10__ times. Do _2___ sessions per day.  External Rotation (Isometric)  Place back of left fist against door frame, with elbow bent. Press fist against door frame. Hold __5__ seconds. Repeat _10___ times. Do _2_ sessions per day.   [facility] [address] [place], [place] [address]  [phone] (office) [phone] (fax)

## 2016-02-08 ENCOUNTER — Encounter: Payer: Self-pay | Admitting: Physical Therapy

## 2016-02-08 ENCOUNTER — Ambulatory Visit (INDEPENDENT_AMBULATORY_CARE_PROVIDER_SITE_OTHER): Payer: Managed Care, Other (non HMO) | Admitting: Physical Therapy

## 2016-02-08 DIAGNOSIS — M25511 Pain in right shoulder: Secondary | ICD-10-CM | POA: Diagnosis not present

## 2016-02-08 DIAGNOSIS — R29898 Other symptoms and signs involving the musculoskeletal system: Secondary | ICD-10-CM | POA: Diagnosis not present

## 2016-02-08 DIAGNOSIS — M6281 Muscle weakness (generalized): Secondary | ICD-10-CM

## 2016-02-08 DIAGNOSIS — R293 Abnormal posture: Secondary | ICD-10-CM

## 2016-02-08 NOTE — Therapy (Signed)
[facility] [place] [address] [place], [place], [address] Phone: [phone]   Fax:  [phone]  Physical Therapy Treatment  Patient Details  Name: JONATHYN CAROTHERS MRN: [id number] Date of Birth: [birth year] Referring Provider: Dr Jones Broom  Encounter Date: 02/08/2016      PT End of Session - 02/08/16 1439    Visit Number 8   Number of Visits 24   Date for PT Re-Evaluation 03/08/16   PT Start Time 1438   PT Stop Time 1531   PT Time Calculation (min) 53 min      Past Medical History:  Diagnosis Date  . Arthritis    back  . Depression   . Diabetes mellitus without complication (HCC)   . Hyperlipidemia   . Hypertension   . Renal disorder    calculi, multiple lithotripsies, stents  . Rotator cuff disorder   . Spinal pain     Past Surgical History:  Procedure Laterality Date  . ANTERIOR FUSION CERVICAL SPINE    . FOOT SURGERY    . HERNIA REPAIR    . LITHOTRIPSY    . SHOULDER ARTHROSCOPY WITH ROTATOR CUFF REPAIR AND SUBACROMIAL DECOMPRESSION Right 12/28/2015   Procedure: SHOULDER ARTHROSCOPY TAKE DOWN ROTATOR CUFF REPAIR AND SUBACROMIAL DECOMPRESSION, DEBRIDEMENT OF SLAP TEAR;  Surgeon: Jones Broom, MD;  Location: [facility];  Service: Orthopedics;  Laterality: Right;  SHOULDER ARTHROSCOPY TAKE DOWN ROTATOR CUFF REPAIR AND SUBACROMIAL DECOMPRESSION  . VASECTOMY Bilateral     There were no vitals filed for this visit.      Subjective Assessment - 02/08/16 1439    Subjective Pt. has noticed a decrease in ROM and some increase in pain.  He hasn't been icing as much and he's concerned. He has a couple more days in the sling per his MD   Currently in Pain? No/denies  at rest, only with lifting his arm.             [facility] PT Assessment - 02/08/16 0001      Assessment   Medical Diagnosis Rt RCR; DCR; SAD    Referring Provider Dr Jones Broom   Onset Date/Surgical Date 12/28/15   Hand Dominance Right   Next MD Visit 03/09/16     PROM   Right Shoulder Flexion 150 Degrees  in supine   Right Shoulder ABduction 110 Degrees  in supine   Right Shoulder Internal Rotation 80 Degrees   Right Shoulder External Rotation 42 Degrees                     [facility] Adult PT Treatment/Exercise - 02/08/16 0001      Shoulder Exercises: Supine   Horizontal ABduction AAROM;10 reps  with band   Flexion AAROM;Right;10 reps  with wand   Other Supine Exercises 15 reps, 5 sec hold, shoulder presses thoracic lifts.      Shoulder Exercises: Prone   Retraction Strengthening;Right  3x10 with arm off EOB then with arm by side     Shoulder Exercises: Standing   Flexion Right;20 reps  wall climbs     Shoulder Exercises: Pulleys   Flexion 2 minutes   ABduction 2 minutes  scaption     Shoulder Exercises: Stretch   External Rotation Stretch 30 seconds;3 reps     Modalities   Modalities Electrical Stimulation;Vasopneumatic     Programme researcher, broadcasting/film/video Location Rt shoulder    Electrical Stimulation Action IFC   Electrical Stimulation  Parameters to tolerance   Electrical Stimulation Goals Pain     Vasopneumatic   Number Minutes Vasopneumatic  15 minutes   Vasopnuematic Location  Shoulder   Vasopneumatic Pressure Low   Vasopneumatic Temperature  3*                   PT Short Term Goals - 02/01/16 1528      PT SHORT TERM GOAL #1   Title Improve posture and alignment with patient engaging posterior shoulder girdle musculature 02/09/16   Time 6   Period Weeks   Status On-going     PT SHORT TERM GOAL #2   Title Initiate AAROM/AROM exercises, progressing per protocol 02/09/16   Time 6   Period Weeks   Status Achieved     PT SHORT TERM GOAL #3   Title Education re posture; alignment; progressive exercise program; rehab 02/09/16   Time 6   Period Weeks   Status On-going           PT Long Term Goals - 01/25/16 1444       PT LONG TERM GOAL #1   Title Improve posture and alignment with patient demonstrating posterior shoulder girlde control for UE function 03/08/16   Time 12   Period Weeks   Status On-going     PT LONG TERM GOAL #2   Title Improve Rt shoulder ROM to equal or greater than Lt shoulder ROM 03/08/16   Time 12   Period Weeks   Status On-going     PT LONG TERM GOAL #3   Title Rt shoulder strength =/> 5-/5 without pain 03/08/16   Time 12   Period Weeks   Status On-going     PT LONG TERM GOAL #4   Title Independent in HEP 03/08/16   Time 12   Period Weeks   Status On-going     PT LONG TERM GOAL #5   Title improve FOTO =/< 39% impaired 03/08/16   Time 12   Period Weeks   Status On-going               Plan - 02/08/16 1652    Clinical Impression Statement [name] is very concerned as he feels like he is taking a step back, less ROM and more discomfort when he is moving his arm.  Explained that this happens as he is allowed to progress through the protocol  Strongly encouraged to increase the use of ice. Measurements were good passively.  He is very weak in his periscapular muscles with poor control of his shoulder complex.     Rehab Potential Good   PT Frequency 2x / week   PT Duration 12 weeks   PT Treatment/Interventions Patient/family education;ADLs/Self Care Home Management;Cryotherapy;Electrical Stimulation;Iontophoresis 4mg /ml Dexamethasone;Moist Heat;Traction;Ultrasound;Manual techniques;Dry needling;Therapeutic activities;Therapeutic exercise   PT Next Visit Plan Progress with ROM; scapular stabilization; postural correction; progress with rehab per MD protocol    Consulted and Agree with Plan of Care Patient      Patient will benefit from skilled therapeutic intervention in order to improve the following deficits and impairments:  Postural dysfunction, Improper body mechanics, Pain, Impaired UE functional use, Decreased range of motion, Decreased mobility, Decreased strength,  Decreased activity tolerance  Visit Diagnosis: Pain in right shoulder  Muscle weakness (generalized)  Abnormal posture  Weakness of shoulder     Problem List Patient Active Problem List   Diagnosis Date Noted  . Paronychia of great toe, left 08/04/2015  . Onychomycosis of right great  toe 03/03/2015  . Obesity 01/23/2015  . Right shoulder pain 01/23/2015  . Former smoker 08/28/2014  . Nephrolithiasis 07/25/2014  . Diabetes mellitus, type 2 (HCC) 07/25/2014  . Essential hypertension, benign 07/25/2014  . Annual physical exam 07/25/2014  . Hyperlipidemia 07/25/2014    Roderic ScarceSusan Shontay Wallner PT  02/08/2016, 4:56 PM  [facility] [address] [place], [place], [address] Phone: [phone]   Fax:  [phone]  Name: Belva BertinGary E Hoppes MRN: [id number] Date of Birth: 1955-09-07

## 2016-02-11 ENCOUNTER — Ambulatory Visit (INDEPENDENT_AMBULATORY_CARE_PROVIDER_SITE_OTHER): Payer: Managed Care, Other (non HMO) | Admitting: Physical Therapy

## 2016-02-11 DIAGNOSIS — R29898 Other symptoms and signs involving the musculoskeletal system: Secondary | ICD-10-CM | POA: Diagnosis not present

## 2016-02-11 DIAGNOSIS — M6281 Muscle weakness (generalized): Secondary | ICD-10-CM

## 2016-02-11 DIAGNOSIS — R293 Abnormal posture: Secondary | ICD-10-CM | POA: Diagnosis not present

## 2016-02-11 DIAGNOSIS — M25511 Pain in right shoulder: Secondary | ICD-10-CM

## 2016-02-11 NOTE — Therapy (Signed)
[facility] [address] [place], [place], [address] Phone: [phone]   Fax:  [phone]  Physical Therapy Treatment  Patient Details  Name: Robert BertinGary E Hines MRN: [id number] Date of Birth: 11-10-1955 Referring Provider: Dr. Jones BroomJustin Chandler  Encounter Date: 02/11/2016      PT End of Session - 02/11/16 1530    Visit Number 10  miscounted after visit 3.   Number of Visits 24   Date for PT Re-Evaluation 03/08/16   PT Start Time 1447   PT Stop Time 1542   PT Time Calculation (min) 55 min   Activity Tolerance Patient tolerated treatment well   Behavior During Therapy WFL for tasks assessed/performed      Past Medical History:  Diagnosis Date  . Arthritis    back  . Depression   . Diabetes mellitus without complication (HCC)   . Hyperlipidemia   . Hypertension   . Renal disorder    calculi, multiple lithotripsies, stents  . Rotator cuff disorder   . Spinal pain     Past Surgical History:  Procedure Laterality Date  . ANTERIOR FUSION CERVICAL SPINE    . FOOT SURGERY    . HERNIA REPAIR    . LITHOTRIPSY    . SHOULDER ARTHROSCOPY WITH ROTATOR CUFF REPAIR AND SUBACROMIAL DECOMPRESSION Right 12/28/2015   Procedure: SHOULDER ARTHROSCOPY TAKE DOWN ROTATOR CUFF REPAIR AND SUBACROMIAL DECOMPRESSION, DEBRIDEMENT OF SLAP TEAR;  Surgeon: Jones BroomJustin Chandler, MD;  Location: [facility];  Service: Orthopedics;  Laterality: Right;  SHOULDER ARTHROSCOPY TAKE DOWN ROTATOR CUFF REPAIR AND SUBACROMIAL DECOMPRESSION  . VASECTOMY Bilateral     There were no vitals filed for this visit.      Subjective Assessment - 02/11/16 1454    Subjective Pt reports his Rt shoulder feels a bit  better than last visit.  Otherwise no new changes since last visit. (Pt presents without sling)   Patient is accompained by: Family member   Currently in Pain? No/denies  up to 4/10 with movement.             [facility] Assessment -  02/11/16 0001      Assessment   Medical Diagnosis Rt RCR; DCR; SAD    Referring Provider Dr. Jones BroomJustin Chandler   Onset Date/Surgical Date 12/28/15   Hand Dominance Right   Next MD Visit 03/09/16           [facility] Treatment/Exercise - 02/11/16 0001      Shoulder Exercises: Supine   External Rotation AAROM;Right;10 reps  with cane   Flexion AROM;Strengthening;Right;10 reps   Other Supine Exercises hand behind head stretch      Shoulder Exercises: Prone   Extension Right;10 reps  with scap retraction    Horizontal ABduction 1 Right;10 reps  palm down.    Other Prone Exercises Prone Rt row x 10 reps, with scap squeeze.      Shoulder Exercises: Standing   Other Standing Exercises pendulum 30 CW/30 CCW     Shoulder Exercises: Pulleys   Flexion 2 minutes   ABduction 2 minutes  scaption   Other Pulley Exercises IR 10 reps x 10 sec hold.      Modalities   Modalities Proofreaderlectrical Stimulation;Vasopneumatic     Electrical Stimulation   Electrical Stimulation Location Rt shoulder   Electrical Stimulation Action IFC   Electrical Stimulation Parameters  to tolerance    Electrical Stimulation Goals Pain     Vasopneumatic   Number Minutes  Vasopneumatic  15 minutes   Vasopnuematic Location  Shoulder   Vasopneumatic Pressure Low   Vasopneumatic Temperature  3*     Manual Therapy   Soft tissue mobilization Rt shoulder girdle musculature pt supine    Myofascial Release Rt pec release.    Passive ROM Rt shoulder scaption, ER.            PT Short Term Goals - 02/01/16 1528      PT SHORT TERM GOAL #1   Title Improve posture and alignment with patient engaging posterior shoulder girdle musculature 02/09/16   Time 6   Period Weeks   Status On-going     PT SHORT TERM GOAL #2   Title Initiate AAROM/AROM exercises, progressing per protocol 02/09/16   Time 6   Period Weeks   Status Achieved     PT SHORT TERM GOAL #3   Title Education re posture; alignment; progressive  exercise program; rehab 02/09/16   Time 6   Period Weeks   Status On-going           PT Long Term Goals - 01/25/16 1444      PT LONG TERM GOAL #1   Title Improve posture and alignment with patient demonstrating posterior shoulder girlde control for UE function 03/08/16   Time 12   Period Weeks   Status On-going     PT LONG TERM GOAL #2   Title Improve Rt shoulder ROM to equal or greater than Lt shoulder ROM 03/08/16   Time 12   Period Weeks   Status On-going     PT LONG TERM GOAL #3   Title Rt shoulder strength =/> 5-/5 without pain 03/08/16   Time 12   Period Weeks   Status On-going     PT LONG TERM GOAL #4   Title Independent in HEP 03/08/16   Time 12   Period Weeks   Status On-going     PT LONG TERM GOAL #5   Title improve FOTO =/< 39% impaired 03/08/16   Time 12   Period Weeks   Status On-going               Plan - 02/11/16 1616    Clinical Impression Statement Pt is now 7 wks post Rotator cuff repair.  He has been icing 2-3x/day at home and this has helped control pain.  He tolerated periscapular exercises well; continues to fatigue early and required tactile cues.  Progressing towards unmet goals.    Rehab Potential Good   PT Frequency 2x / week   PT Duration 12 weeks   PT Treatment/Interventions Patient/family education;ADLs/Self Care Home Management;Cryotherapy;Electrical Stimulation;Iontophoresis 4mg /ml Dexamethasone;Moist Heat;Traction;Ultrasound;Manual techniques;Dry needling;Therapeutic activities;Therapeutic exercise   PT Next Visit Plan Progress with ROM; scapular stabilization; postural correction; progress with rehab per MD protocol    Consulted and Agree with Plan of Care Patient   Family Member Consulted daughter, [name]       Patient will benefit from skilled therapeutic intervention in order to improve the following deficits and impairments:  Postural dysfunction, Improper body mechanics, Pain, Impaired UE functional use, Decreased range of  motion, Decreased mobility, Decreased strength, Decreased activity tolerance  Visit Diagnosis: Pain in right shoulder  Muscle weakness (generalized)  Abnormal posture  Weakness of shoulder     Problem List Patient Active Problem List   Diagnosis Date Noted  . Paronychia of great toe, left 08/04/2015  . Onychomycosis of right great toe 03/03/2015  . Obesity 01/23/2015  . Right shoulder  pain 01/23/2015  . Former smoker 08/28/2014  . Nephrolithiasis 07/25/2014  . Diabetes mellitus, type 2 (HCC) 07/25/2014  . Essential hypertension, benign 07/25/2014  . Annual physical exam 07/25/2014  . Hyperlipidemia 07/25/2014   Mayer CamelJennifer Carlson-Long, PTA 02/11/16 4:18 PM  [facility] [address] [place], [place], [address] Phone: [phone]   Fax:  [phone]  Name: Robert BertinGary E Rimmer MRN: [id number] Date of Birth: 1955/09/11

## 2016-02-12 ENCOUNTER — Ambulatory Visit (INDEPENDENT_AMBULATORY_CARE_PROVIDER_SITE_OTHER): Payer: Managed Care, Other (non HMO) | Admitting: Sports Medicine

## 2016-02-12 ENCOUNTER — Ambulatory Visit (INDEPENDENT_AMBULATORY_CARE_PROVIDER_SITE_OTHER): Payer: Managed Care, Other (non HMO)

## 2016-02-12 DIAGNOSIS — I700 Atherosclerosis of aorta: Secondary | ICD-10-CM

## 2016-02-12 DIAGNOSIS — J209 Acute bronchitis, unspecified: Secondary | ICD-10-CM

## 2016-02-12 MED ORDER — AZITHROMYCIN 250 MG PO TABS: ORAL_TABLET | ORAL | 0 refills | Status: DC

## 2016-02-12 MED ORDER — HYDROCOD POLST-CPM POLST ER 10-8 MG/5ML PO SUER: 5.0000 mL | Freq: Two times a day (BID) | ORAL | 0 refills | Status: DC | PRN

## 2016-02-12 MED ORDER — PREDNISONE 50 MG PO TABS: 50.0000 mg | ORAL_TABLET | Freq: Every day | ORAL | 0 refills | Status: DC

## 2016-02-12 NOTE — Progress Notes (Signed)
  Subjective:    CC: Increasing cough  HPI: This is a pleasant 60 year old male former smoker with diabetes, he comes in with a 2 Week history of minimally productive cough, fevers and chills. Symptoms are moderate, persistent, no shortness of breath.  Past medical history, Surgical history, Family history not pertinant except as noted below, Social history, Allergies, and medications have been entered into the medical record, reviewed, and no changes needed.   Review of Systems: No fevers, chills, night sweats, weight loss, chest pain, or shortness of breath.   Objective:    General: Well Developed, well nourished, and in no acute distress.  Neuro: Alert and oriented x3, extra-ocular muscles intact, sensation grossly intact.  HEENT: Normocephalic, atraumatic, pupils equal round reactive to light, neck supple, no masses, no lymphadenopathy, thyroid nonpalpable.  Skin: Warm and dry, no rashes. Cardiac: Regular rate and rhythm, no murmurs rubs or gallops, no lower extremity edema.  Respiratory: Clear to auscultation bilaterally. Not using accessory muscles, speaking in full sentences.  Impression and Recommendations:    Acute bronchitis In a diabetic male, former smoker we will treat this aggressively with azithromycin, prednisone, Tussionex, chest x-ray. Return to see me if no better in 2 weeks.  I spent 25 minutes with this patient, greater than 50% was face-to-face time counseling regarding the above diagnoses

## 2016-02-12 NOTE — Assessment & Plan Note (Signed)
In a diabetic male, former smoker we will treat this aggressively with azithromycin, prednisone, Tussionex, chest x-ray. Return to see me if no better in 2 weeks.

## 2016-02-15 ENCOUNTER — Encounter: Payer: Self-pay | Admitting: Rehabilitative and Restorative Service Providers"

## 2016-02-15 ENCOUNTER — Ambulatory Visit (INDEPENDENT_AMBULATORY_CARE_PROVIDER_SITE_OTHER): Payer: Managed Care, Other (non HMO) | Admitting: Rehabilitative and Restorative Service Providers"

## 2016-02-15 DIAGNOSIS — R29898 Other symptoms and signs involving the musculoskeletal system: Secondary | ICD-10-CM

## 2016-02-15 DIAGNOSIS — M25511 Pain in right shoulder: Secondary | ICD-10-CM

## 2016-02-15 DIAGNOSIS — R293 Abnormal posture: Secondary | ICD-10-CM | POA: Diagnosis not present

## 2016-02-15 DIAGNOSIS — M6281 Muscle weakness (generalized): Secondary | ICD-10-CM | POA: Diagnosis not present

## 2016-02-15 NOTE — Therapy (Signed)
[facility] [address] [place], [place], [address] Phone: [phone]   Fax:  [phone]  Physical Therapy Treatment  Patient Details  Name: Robert Hines MRN: [id number] Date of Birth: [birth year] Referring Provider: Dr. Jones BroomJustin Hines   Encounter Date: 02/15/2016      PT End of Session - 02/15/16 1438    Visit Number 11   Number of Visits 24   Date for PT Re-Evaluation 03/08/16   PT Start Time 1434   PT Stop Time 1530   PT Time Calculation (min) 56 min   Activity Tolerance Patient tolerated treatment well      Past Medical History:  Diagnosis Date  . Arthritis    back  . Depression   . Diabetes mellitus without complication (HCC)   . Hyperlipidemia   . Hypertension   . Renal disorder    calculi, multiple lithotripsies, stents  . Rotator cuff disorder   . Spinal pain     Past Surgical History:  Procedure Laterality Date  . ANTERIOR FUSION CERVICAL SPINE    . FOOT SURGERY    . HERNIA REPAIR    . LITHOTRIPSY    . SHOULDER ARTHROSCOPY WITH ROTATOR CUFF REPAIR AND SUBACROMIAL DECOMPRESSION Right 12/28/2015   Procedure: SHOULDER ARTHROSCOPY TAKE DOWN ROTATOR CUFF REPAIR AND SUBACROMIAL DECOMPRESSION, DEBRIDEMENT OF SLAP TEAR;  Surgeon: Robert BroomJustin Chandler, MD;  Location: [facility];  Service: Orthopedics;  Laterality: Right;  SHOULDER ARTHROSCOPY TAKE DOWN ROTATOR CUFF REPAIR AND SUBACROMIAL DECOMPRESSION  . VASECTOMY Bilateral     There were no vitals filed for this visit.      Subjective Assessment - 02/15/16 1439    Subjective Flared up after last visit. Nothing hurt while he was in therapy - any more than usual. He had sifnificant increase in pain that evening and the next day - unable to do his exercise at all.    Currently in Pain? No/denies            [facility] - 02/15/16 0001      Assessment   Medical Diagnosis Rt RCR; DCR; SAD    Referring Provider Dr. Jones BroomJustin  Hines    Onset Date/Surgical Date 12/28/15   Hand Dominance Right   Next MD Visit 03/09/16     PROM   PROM Assessment Site --  assessed in supine   Right Shoulder Flexion 142 Degrees   Right Shoulder ABduction 124 Degrees   Right Shoulder Internal Rotation 80 Degrees   Right Shoulder External Rotation 64 Degrees                     [facility] Adult PT Treatment/Exercise - 02/15/16 0001      Shoulder Exercises: Standing   Flexion --  3 reps 10 sec hold    Extension Strengthening;Both;10 reps;Theraband   Theraband Level (Shoulder Extension) Level 1 (Yellow)   Extension Limitations AAROM shd extension with cane5 sec hold x 5; horizontal adduction puling hand across hips 5 sec x 5    Row Strengthening;Both;10 reps;Theraband   Theraband Level (Shoulder Row) Level 1 (Yellow)   Other Standing Exercises shoulder scaption x 10 x 2 sets; shd flexioin x 10 x 2 sets back at wall along noodle    Other Standing Exercises pendulum 30 CW/30 CCW     Shoulder Exercises: Pulleys   Flexion --  10 sec hold x 10 reps   ABduction --  10 sec hold x 10  Shoulder Exercises: Stretch   Internal Rotation Stretch 3 reps  20 sec    External Rotation Stretch 30 seconds;3 reps  standing     Electrical Stimulation   Electrical Stimulation Location Rt shoulder   Electrical Stimulation Action IFC   Electrical Stimulation Parameters to tolerance   Electrical Stimulation Goals Pain;Tone     Vasopneumatic   Number Minutes Vasopneumatic  15 minutes   Vasopnuematic Location  Shoulder   Vasopneumatic Pressure Low   Vasopneumatic Temperature  3*     Manual Therapy   Manual therapy comments pt supine    Joint Mobilization Rt GH joint - Grade II/III inferior    Soft tissue mobilization pecs; upper trap; deptoid   Myofascial Release Rt shoulder    Scapular Mobilization Rt   Passive ROM Rt shoulder flexion; scaption; IR; ER                   PT Short Term Goals - 02/15/16 1438       PT SHORT TERM GOAL #1   Title Improve posture and alignment with patient engaging posterior shoulder girdle musculature 02/09/16   Time 6   Period Weeks   Status On-going     PT SHORT TERM GOAL #2   Title Initiate AAROM/AROM exercises, progressing per protocol 02/09/16   Time 6   Period Weeks   Status Achieved     PT SHORT TERM GOAL #3   Title Education re posture; alignment; progressive exercise program; rehab 02/09/16   Time 6   Period Weeks   Status On-going           PT Long Term Goals - 02/15/16 1437      PT LONG TERM GOAL #1   Title Improve posture and alignment with patient demonstrating posterior shoulder girlde control for UE function 03/08/16   Time 12   Period Weeks   Status On-going     PT LONG TERM GOAL #2   Title Improve Rt shoulder ROM to equal or greater than Lt shoulder ROM 03/08/16   Time 12   Period Weeks   Status On-going     PT LONG TERM GOAL #3   Title Rt shoulder strength =/> 5-/5 without pain 03/08/16   Time 12   Period Weeks   Status On-going     PT LONG TERM GOAL #4   Title Independent in HEP 03/08/16   Time 12   Period Weeks   Status On-going     PT LONG TERM GOAL #5   Title improve FOTO =/< 39% impaired 03/08/16   Time 12   Period Weeks   Status On-going               Plan - 02/15/16 1518    Clinical Impression Statement Discussed return to work - Robert Hines would like to consider returning to work on light duty before his return to MD in September. Advised pt that he should wait until he sees MD - he is only 7 weeks out of surgery and has a physical job. He has short term disability and is OK to wait. Will be on vacation for some days next week and returns to MD the following week. Robert Hines continues to progress with PROM except flexion. Will progress with rehab per protocol.    Rehab Potential Good   PT Frequency 2x / week   PT Duration 12 weeks   PT Treatment/Interventions Patient/family education;ADLs/Self Care Home  Management;Cryotherapy;Electrical Stimulation;Iontophoresis 4mg /ml Dexamethasone;Moist Heat;Traction;Ultrasound;Manual techniques;Dry needling;Therapeutic  activities;Therapeutic exercise   PT Next Visit Plan Progress with ROM; scapular stabilization; postural correction; progress with rehab per MD protocol    Consulted and Agree with Plan of Care Patient   Family Member Consulted daughter, Robert Hines       Patient will benefit from skilled therapeutic intervention in order to improve the following deficits and impairments:  Postural dysfunction, Improper body mechanics, Pain, Impaired UE functional use, Decreased range of motion, Decreased mobility, Decreased strength, Decreased activity tolerance  Visit Diagnosis: Pain in right shoulder  Muscle weakness (generalized)  Abnormal posture  Weakness of shoulder     Problem List Patient Active Problem List   Diagnosis Date Noted  . Acute bronchitis 02/12/2016  . Onychomycosis of right great toe 03/03/2015  . Obesity 01/23/2015  . Right shoulder pain 01/23/2015  . Former smoker 08/28/2014  . Nephrolithiasis 07/25/2014  . Diabetes mellitus, type 2 (HCC) 07/25/2014  . Essential hypertension, benign 07/25/2014  . Annual physical exam 07/25/2014  . Hyperlipidemia 07/25/2014    Robert Hines Rober Minion PT, MPH 02/15/2016, 3:24 PM  [facility] [address] [place], [place], [address] Phone: [phone]   Fax:  [phone]  Name: LAQUINCY EASTRIDGE MRN: [id number] Date of Birth: [birth year]

## 2016-02-16 ENCOUNTER — Ambulatory Visit (INDEPENDENT_AMBULATORY_CARE_PROVIDER_SITE_OTHER): Payer: Managed Care, Other (non HMO) | Admitting: Sports Medicine

## 2016-02-16 ENCOUNTER — Encounter: Payer: Self-pay | Admitting: Sports Medicine

## 2016-02-16 DIAGNOSIS — Z794 Long term (current) use of insulin: Secondary | ICD-10-CM

## 2016-02-16 DIAGNOSIS — L600 Ingrowing nail: Secondary | ICD-10-CM | POA: Insufficient documentation

## 2016-02-16 DIAGNOSIS — E119 Type 2 diabetes mellitus without complications: Secondary | ICD-10-CM | POA: Diagnosis not present

## 2016-02-16 LAB — POCT GLYCOSYLATED HEMOGLOBIN (HGB A1C): Hemoglobin A1C: 7.6

## 2016-02-16 NOTE — Assessment & Plan Note (Signed)
Controlled, no changes. Foot exam is unremarkable with the exception of an ingrown right toenail. Return in 6 months.

## 2016-02-16 NOTE — Assessment & Plan Note (Signed)
Return for toenail excision with phenol

## 2016-02-16 NOTE — Progress Notes (Signed)
  Subjective:    CC: Follow-up  HPI: Diabetes mellitus type 2: Previously well controlled, continues with Lantus, Trulicity, XigDuo. Due for a foot exam.  Right toe pain: History of ingrown toenail. Has some pain that of his toenail with a bit of bleeding, no redness or constitutional symptoms  Past medical history, Surgical history, Family history not pertinant except as noted below, Social history, Allergies, and medications have been entered into the medical record, reviewed, and no changes needed.   Review of Systems: No fevers, chills, night sweats, weight loss, chest pain, or shortness of breath.   Objective:    General: Well Developed, well nourished, and in no acute distress.  Neuro: Alert and oriented x3, extra-ocular muscles intact, sensation grossly intact.  HEENT: Normocephalic, atraumatic, pupils equal round reactive to light, neck supple, no masses, no lymphadenopathy, thyroid nonpalpable.  Skin: Warm and dry, no rashes. Cardiac: Regular rate and rhythm, no murmurs rubs or gallops, no lower extremity edema.  Respiratory: Clear to auscultation bilaterally. Not using accessory muscles, speaking in full sentences. Diabetic Foot Exam Both feet were examined, there are no signs of ulceration or abnormal callus. Right great toenail is thickened, dystrophic, and ingrown on the medial and lateral aspects. No evidence of cellulitis. Dorsalis pedis and posterior tibial pulses are palpable. Sensation is intact to sharp and monofilament. Shoes are of appropriate fitment.  Hemoglobin A1c is 7.6%  Impression and Recommendations:    Diabetes mellitus, type 2 Controlled, no changes. Foot exam is unremarkable with the exception of an ingrown right toenail. Return in 6 months.  Ingrown right big toenail Return for toenail excision with phenol  I spent 25 minutes with this patient, greater than 50% was face-to-face time counseling regarding the above diagnoses

## 2016-02-18 ENCOUNTER — Ambulatory Visit (INDEPENDENT_AMBULATORY_CARE_PROVIDER_SITE_OTHER): Payer: Managed Care, Other (non HMO) | Admitting: Physical Therapy

## 2016-02-18 DIAGNOSIS — M25511 Pain in right shoulder: Secondary | ICD-10-CM

## 2016-02-18 DIAGNOSIS — M6281 Muscle weakness (generalized): Secondary | ICD-10-CM | POA: Diagnosis not present

## 2016-02-18 DIAGNOSIS — R29898 Other symptoms and signs involving the musculoskeletal system: Secondary | ICD-10-CM | POA: Diagnosis not present

## 2016-02-18 DIAGNOSIS — R293 Abnormal posture: Secondary | ICD-10-CM

## 2016-02-18 NOTE — Therapy (Signed)
[facility] [place] [address] [place], [place], [address] Phone: [phone]   Fax:  [phone]  Physical Therapy Treatment  Patient Details  Name: Robert Hines MRN: [id number] Date of Birth: 1955-08-11 Referring Provider: Dr. Jones Broom   Encounter Date: 02/18/2016      PT End of Session - 02/18/16 1452    Visit Number 12   Number of Visits 24   Date for PT Re-Evaluation 03/08/16   PT Start Time 1445   PT Stop Time 1546   PT Time Calculation (min) 61 min   Activity Tolerance Patient tolerated treatment well      Past Medical History:  Diagnosis Date  . Arthritis    back  . Depression   . Diabetes mellitus without complication (HCC)   . Hyperlipidemia   . Hypertension   . Renal disorder    calculi, multiple lithotripsies, stents  . Rotator cuff disorder   . Spinal pain     Past Surgical History:  Procedure Laterality Date  . ANTERIOR FUSION CERVICAL SPINE    . FOOT SURGERY    . HERNIA REPAIR    . LITHOTRIPSY    . SHOULDER ARTHROSCOPY WITH ROTATOR CUFF REPAIR AND SUBACROMIAL DECOMPRESSION Right 12/28/2015   Procedure: SHOULDER ARTHROSCOPY TAKE DOWN ROTATOR CUFF REPAIR AND SUBACROMIAL DECOMPRESSION, DEBRIDEMENT OF SLAP TEAR;  Surgeon: Jones Broom, MD;  Location: [facility];  Service: Orthopedics;  Laterality: Right;  SHOULDER ARTHROSCOPY TAKE DOWN ROTATOR CUFF REPAIR AND SUBACROMIAL DECOMPRESSION  . VASECTOMY Bilateral     There were no vitals filed for this visit.      Subjective Assessment - 02/18/16 1452    Subjective Pt reports he has been working on the overhead stretch (flexion in supine), so he is a little sore from that.    Pertinent History HTN; cervical fusion ~ 7 yrs ago; HNP cervical spine no surgery ~5 yrs ago    Patient Stated Goals use shoulder again without pain   Currently in Pain? Yes   Pain Score 1    Pain Location Shoulder   Pain Orientation Right   Pain  Descriptors / Indicators Sore   Aggravating Factors  movement of Rt arm behind back and overhead.    Pain Relieving Factors ice            [facility] PT Assessment - 02/18/16 0001      Assessment   Medical Diagnosis Rt RCR; DCR; SAD    Referring Provider Dr. Jones Broom    Onset Date/Surgical Date 12/28/15   Hand Dominance Right   Next MD Visit 03/09/16     AROM   Right/Left Shoulder Right   Right Shoulder Flexion 135 Degrees  standing, 150 supine   Right Shoulder ABduction 120 Degrees   Right Shoulder External Rotation --  supine, shoulder abdct to ~70   Left Shoulder Extension 50 Degrees                     [facility] Adult PT Treatment/Exercise - 02/18/16 0001      Shoulder Exercises: Supine   External Rotation AAROM;Right;5 reps   Flexion AAROM;Right;5 reps   Other Supine Exercises hand behind head stretch held 60 sec x 3 reps      Shoulder Exercises: Prone   Flexion Right  8 reps to tolerance, tactile cues for form.    Horizontal ABduction 1 Right;10 reps  palm down.    Other Prone Exercises Prone Rt row  x 10 reps, with scap squeeze.      Shoulder Exercises: Pulleys   Flexion 2 minutes   ABduction 2 minutes   Other Pulley Exercises IR 5 reps x 10 sec hold.   tactile/VC for form.      Modalities   Modalities Geologist, engineering IFC   Electrical Stimulation Parameters to tolerance      Vasopneumatic   Number Minutes Vasopneumatic  15 minutes   Vasopnuematic Location  Shoulder   Vasopneumatic Pressure Low   Vasopneumatic Temperature  3*     Manual Therapy   Soft tissue mobilization TPR to Rt levator, rhomboid, upper trap    Myofascial Release Rt pec release    Passive ROM Rt shoulder flexion; scaption; IR; ER                   PT Short Term Goals - 02/15/16 1438      PT SHORT TERM GOAL #1   Title Improve  posture and alignment with patient engaging posterior shoulder girdle musculature 02/09/16   Time 6   Period Weeks   Status On-going     PT SHORT TERM GOAL #2   Title Initiate AAROM/AROM exercises, progressing per protocol 02/09/16   Time 6   Period Weeks   Status Achieved     PT SHORT TERM GOAL #3   Title Education re posture; alignment; progressive exercise program; rehab 02/09/16   Time 6   Period Weeks   Status On-going           PT Long Term Goals - 02/15/16 1437      PT LONG TERM GOAL #1   Title Improve posture and alignment with patient demonstrating posterior shoulder girlde control for UE function 03/08/16   Time 12   Period Weeks   Status On-going     PT LONG TERM GOAL #2   Title Improve Rt shoulder ROM to equal or greater than Lt shoulder ROM 03/08/16   Time 12   Period Weeks   Status On-going     PT LONG TERM GOAL #3   Title Rt shoulder strength =/> 5-/5 without pain 03/08/16   Time 12   Period Weeks   Status On-going     PT LONG TERM GOAL #4   Title Independent in HEP 03/08/16   Time 12   Period Weeks   Status On-going     PT LONG TERM GOAL #5   Title improve FOTO =/< 39% impaired 03/08/16   Time 12   Period Weeks   Status On-going               Plan - 02/18/16 1748    Clinical Impression Statement Pt tolerated exercises and PROM / manual therapy with minimal increase in pain. Pt's Rt shoulder AROM improving.  Pt encouraged to use ice/ TENS while on vacation, while continuing HEP.     Rehab Potential Good   PT Frequency 2x / week   PT Duration 12 weeks   PT Treatment/Interventions Patient/family education;ADLs/Self Care Home Management;Cryotherapy;Electrical Stimulation;Iontophoresis 4mg /ml Dexamethasone;Moist Heat;Traction;Ultrasound;Manual techniques;Dry needling;Therapeutic activities;Therapeutic exercise   PT Next Visit Plan Progress with ROM; scapular stabilization; postural correction; progress with rehab per MD protocol    Consulted  and Agree with Plan of Care Patient      Patient will benefit from skilled therapeutic intervention in order to improve the following deficits and  impairments:  Postural dysfunction, Improper body mechanics, Pain, Impaired UE functional use, Decreased range of motion, Decreased mobility, Decreased strength, Decreased activity tolerance  Visit Diagnosis: Pain in right shoulder  Muscle weakness (generalized)  Abnormal posture  Weakness of shoulder     Problem List Patient Active Problem List   Diagnosis Date Noted  . Ingrown right big toenail 02/16/2016  . Obesity 01/23/2015  . Right shoulder pain 01/23/2015  . Former smoker 08/28/2014  . Nephrolithiasis 07/25/2014  . Diabetes mellitus, type 2 (HCC) 07/25/2014  . Essential hypertension, benign 07/25/2014  . Annual physical exam 07/25/2014  . Hyperlipidemia 07/25/2014   Mayer CamelJennifer Carlson-Long, PTA 02/18/16 6:00 PM  [facility] [address] [place], [place], [address] Phone: [phone]   Fax:  [phone]  Name: Robert Hines MRN: [id number] Date of Birth: 03/28/1956

## 2016-02-23 ENCOUNTER — Encounter: Payer: Self-pay | Admitting: Sports Medicine

## 2016-02-23 ENCOUNTER — Ambulatory Visit (INDEPENDENT_AMBULATORY_CARE_PROVIDER_SITE_OTHER): Payer: Managed Care, Other (non HMO) | Admitting: Sports Medicine

## 2016-02-23 DIAGNOSIS — R21 Rash and other nonspecific skin eruption: Secondary | ICD-10-CM | POA: Diagnosis not present

## 2016-02-23 DIAGNOSIS — L600 Ingrowing nail: Secondary | ICD-10-CM

## 2016-02-23 MED ORDER — CLOTRIMAZOLE-BETAMETHASONE 1-0.05 % EX CREA: 1.0000 "application " | TOPICAL_CREAM | Freq: Two times a day (BID) | CUTANEOUS | 0 refills | Status: DC

## 2016-02-23 NOTE — Assessment & Plan Note (Signed)
Simple macular rash on the chest, asymptomatic, adding topical Lotrisone.

## 2016-02-23 NOTE — Assessment & Plan Note (Signed)
Right great toenail excision with phenol, return in one week for a wound check.

## 2016-02-23 NOTE — Progress Notes (Signed)
  Subjective:    CC: Toenail removal and chest rash  HPI: For 2 months this pleasant 60 year old male has had a rash on his chest, asymptomatic. Occurs often when he is hot and sweaty. Somewhat scaling. No drainage, no constitutional symptoms.  Onychodystrophy: Here for great toenail excision on the right side.  Past medical history, Surgical history, Family history not pertinant except as noted below, Social history, Allergies, and medications have been entered into the medical record, reviewed, and no changes needed.   Review of Systems: No fevers, chills, night sweats, weight loss, chest pain, or shortness of breath.   Objective:    General: Well Developed, well nourished, and in no acute distress.  Neuro: Alert and oriented x3, extra-ocular muscles intact, sensation grossly intact.  HEENT: Normocephalic, atraumatic, pupils equal round reactive to light, neck supple, no masses, no lymphadenopathy, thyroid nonpalpable.  Skin: Warm and dry, There is a macular rash with scaling on the anterior chest. Cardiac: Regular rate and rhythm, no murmurs rubs or gallops, no lower extremity edema.  Respiratory: Clear to auscultation bilaterally. Not using accessory muscles, speaking in full sentences.  Procedure:  Removal of right great toenail. Risks, benefits, alternatives explained to patient. Consent obtained. Time out conducted. Noted no overlying induration or erythema at site of injection. Toe cleaned with alcohol, then a total of 10 cc lidocaine 2% infiltrated at adjacent webspaces at the location of the bifurcation of the common digital nerve to proper digital nerves.  Some lidocaine also infiltrated at hyponychium and under nail bed.  Adequate anesthesia ensured. Toe prepped and draped in a sterile fashion. Nail elevator used to separate nail plate from nail bed. Hemostat used to remove the entire nail plate Nail bed and matrix treated. Minor bleeding controlled with pressure and  phenol. Antibiotic ointment applied. Toe dressed. Advised to return if increased redness, swelling, drainage, fevers, or chills.  Impression and Recommendations:    Skin rash Simple macular rash on the chest, asymptomatic, adding topical Lotrisone.  Ingrown right big toenail Right great toenail excision with phenol, return in one week for a wound check.  I spent 25 minutes with this patient, greater than 50% was face-to-face time counseling regarding the above diagnoses, this was separate from the time spent performing the procedure

## 2016-02-24 ENCOUNTER — Ambulatory Visit (INDEPENDENT_AMBULATORY_CARE_PROVIDER_SITE_OTHER): Payer: Managed Care, Other (non HMO) | Admitting: Physical Therapy

## 2016-02-24 DIAGNOSIS — M6281 Muscle weakness (generalized): Secondary | ICD-10-CM

## 2016-02-24 DIAGNOSIS — M25511 Pain in right shoulder: Secondary | ICD-10-CM

## 2016-02-24 DIAGNOSIS — R29898 Other symptoms and signs involving the musculoskeletal system: Secondary | ICD-10-CM | POA: Diagnosis not present

## 2016-02-24 DIAGNOSIS — R293 Abnormal posture: Secondary | ICD-10-CM

## 2016-02-24 NOTE — Therapy (Signed)
[place], [place], [address] Phone: [phone]   Fax:  [phone]  Physical Therapy Treatment  Patient Details  Name: Robert Hines MRN: [id number] Date of Birth: 1956/03/04 Referring Provider: Dr. Tania Ade  Encounter Date: 02/24/2016      PT End of Session - 02/24/16 1006    Visit Number 13   Number of Visits 24   Date for PT Re-Evaluation 03/08/16   PT Start Time 0933   PT Stop Time 1029   PT Time Calculation (min) 56 min   Activity Tolerance Patient tolerated treatment well      Past Medical History:  Diagnosis Date  . Arthritis    back  . Depression   . Diabetes mellitus without complication ([place])   . Hyperlipidemia   . Hypertension   . Renal disorder    calculi, multiple lithotripsies, stents  . Rotator cuff disorder   . Spinal pain     Past Surgical History:  Procedure Laterality Date  . ANTERIOR FUSION CERVICAL SPINE    . FOOT SURGERY    . HERNIA REPAIR    . LITHOTRIPSY    . SHOULDER ARTHROSCOPY WITH ROTATOR CUFF REPAIR AND SUBACROMIAL DECOMPRESSION Right 12/28/2015   Procedure: SHOULDER ARTHROSCOPY TAKE DOWN ROTATOR CUFF REPAIR AND SUBACROMIAL DECOMPRESSION, DEBRIDEMENT OF SLAP TEAR;  Surgeon: Tania Ade, MD;  Location: [facility];  Service: Orthopedics;  Laterality: Right;  SHOULDER ARTHROSCOPY TAKE DOWN ROTATOR CUFF REPAIR AND SUBACROMIAL DECOMPRESSION  . VASECTOMY Bilateral     There were no vitals filed for this visit.      Subjective Assessment - 02/24/16 0934    Subjective Pt has just returned from a 5 day trip.  He brought his TENS unit at night, ice packs during the day, and tried to do his exercises.  He feels he has stiffened up since riding in car for hours at a time.  His pain was up to 4/10 at times during trip.     Pertinent History HTN; cervical fusion ~ 7 yrs ago; HNP cervical spine no surgery ~5 yrs ago    Patient Stated  Goals use shoulder again without pain   Currently in Pain? No/denies            [facility] PT Assessment - 02/24/16 0001      Assessment   Medical Diagnosis Rt RCR; DCR; SAD    Referring Provider Dr. Tania Ade   Onset Date/Surgical Date 12/28/15   Hand Dominance Right   Next MD Visit 03/09/16     AROM   Right Shoulder Flexion 130 Degrees  standing    Right Shoulder External Rotation 59 Degrees  supine, shoulder abdct to ~70           [facility] Adult PT Treatment/Exercise - 02/24/16 0001      Shoulder Exercises: Supine   Flexion AAROM;Right;5 reps     Shoulder Exercises: Prone   Extension Right;12 reps  with scap retraction, 1# in hand   Horizontal ABduction 1 Right;12 reps  palm down.    Other Prone Exercises Prone Rt row x 10 reps, with scap squeeze (1# in hand)     Shoulder Exercises: Sidelying   External Rotation Strengthening;Right;12 reps;Weights   External Rotation Weight (lbs) 1   ABduction Strengthening;Right;5 reps;Weights   ABduction Weight (lbs) 1     Shoulder Exercises: Standing   Other Standing Exercises pendulum 30 CW/30 CCW     Shoulder Exercises: Pulleys  Flexion 3 minutes   ABduction 3 minutes   Other Pulley Exercises IR 5 reps x 10 sec hold.   tactile/VC for form.      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   Electrical Stimulation Parameters to tolerance    Electrical Stimulation Goals Tone;Pain     Vasopneumatic   Number Minutes Vasopneumatic  15 minutes   Vasopnuematic Location  Shoulder   Vasopneumatic Pressure Low     Manual Therapy   Myofascial Release Rt pec release, upper trap, levator.   Passive ROM Rt shoulder flexion; scaption; IR; ER            PT Short Term Goals - 02/24/16 1315      PT SHORT TERM GOAL #1   Title Improve posture and alignment with patient engaging posterior shoulder girdle musculature 02/09/16   Time 6   Period Weeks   Status  Achieved     PT SHORT TERM GOAL #2   Title Initiate AAROM/AROM exercises, progressing per protocol 02/09/16   Time 6   Period Weeks   Status Achieved     PT SHORT TERM GOAL #3   Title Education re posture; alignment; progressive exercise program; rehab 02/09/16   Time 6   Period Weeks   Status Achieved           PT Long Term Goals - 02/15/16 1437      PT LONG TERM GOAL #1   Title Improve posture and alignment with patient demonstrating posterior shoulder girlde control for UE function 03/08/16   Time 12   Period Weeks   Status On-going     PT LONG TERM GOAL #2   Title Improve Rt shoulder ROM to equal or greater than Lt shoulder ROM 03/08/16   Time 12   Period Weeks   Status On-going     PT LONG TERM GOAL #3   Title Rt shoulder strength =/> 5-/5 without pain 03/08/16   Time 12   Period Weeks   Status On-going     PT LONG TERM GOAL #4   Title Independent in HEP 03/08/16   Time 12   Period Weeks   Status On-going     PT LONG TERM GOAL #5   Title improve FOTO =/< 39% impaired 03/08/16   Time 12   Period Weeks   Status On-going               Plan - 02/24/16 1002    Clinical Impression Statement Pt is now 9 wks s/p Rt rotator cuff repair.  Robert Hines's Rt shoulder ROM was slightly decreased from last session; he attributes travel made his shoulder stiff.  Pt tolerated all exercises with mild increase in pain, worst is internal/external rotation stretch.  Pain decreased with vaso / estim.  Pt has met STG and is progressing well towards unmet LTG.    Rehab Potential Good   PT Frequency 2x / week   PT Duration 12 weeks   PT Treatment/Interventions Patient/family education;ADLs/Self Care Home Management;Cryotherapy;Electrical Stimulation;Iontophoresis 40m/ml Dexamethasone;Moist Heat;Traction;Ultrasound;Manual techniques;Dry needling;Therapeutic activities;Therapeutic exercise   PT Next Visit Plan Progress with ROM; scapular stabilization; postural correction; progress with  rehab per MD protocol    Consulted and Agree with Plan of Care Patient      Patient will benefit from skilled therapeutic intervention in order to improve the following deficits and impairments:  Postural dysfunction, Improper body mechanics, Pain, Impaired UE functional use, Decreased range  of motion, Decreased mobility, Decreased strength, Decreased activity tolerance  Visit Diagnosis: Pain in right shoulder  Muscle weakness (generalized)  Abnormal posture  Weakness of shoulder     Problem List Patient Active Problem List   Diagnosis Date Noted  . Skin rash 02/23/2016  . Ingrown right big toenail 02/16/2016  . Obesity 01/23/2015  . Right shoulder pain 01/23/2015  . Former smoker 08/28/2014  . Nephrolithiasis 07/25/2014  . Diabetes mellitus, type 2 ([place]) 07/25/2014  . Essential hypertension, benign 07/25/2014  . Annual physical exam 07/25/2014  . Hyperlipidemia 07/25/2014   Kerin Perna, PTA 02/24/16 1:15 PM   [facility] [place], [place], [address] Phone: [phone]   Fax:  [phone]  Name: Robert Hines MRN: [id number] Date of Birth: 11/05/1955

## 2016-02-25 ENCOUNTER — Encounter: Payer: Self-pay | Admitting: Physical Therapy

## 2016-02-25 ENCOUNTER — Ambulatory Visit (INDEPENDENT_AMBULATORY_CARE_PROVIDER_SITE_OTHER): Payer: Managed Care, Other (non HMO) | Admitting: Physical Therapy

## 2016-02-25 DIAGNOSIS — R29898 Other symptoms and signs involving the musculoskeletal system: Secondary | ICD-10-CM

## 2016-02-25 DIAGNOSIS — M6281 Muscle weakness (generalized): Secondary | ICD-10-CM

## 2016-02-25 DIAGNOSIS — M25511 Pain in right shoulder: Secondary | ICD-10-CM

## 2016-02-25 DIAGNOSIS — R293 Abnormal posture: Secondary | ICD-10-CM

## 2016-02-25 NOTE — Therapy (Addendum)
[facility] [address] [place], [place], [address] Phone: [phone]   Fax:  [phone]  Physical Therapy Treatment  Patient Details  Name: Robert Hines MRN: [id number] Date of Birth: March 14, 1956 Referring Provider: Dr. Jones BroomJustin Chandler  Encounter Date: 02/25/2016      PT End of Session - 02/25/16 1403    Visit Number 14   Number of Visits 24   Date for PT Re-Evaluation 03/08/16   PT Start Time 1403   PT Stop Time 1458   PT Time Calculation (min) 55 min   Activity Tolerance Patient tolerated treatment well      Past Medical History:  Diagnosis Date  . Arthritis    back  . Depression   . Diabetes mellitus without complication ([facility])   . Hyperlipidemia   . Hypertension   . Renal disorder    calculi, multiple lithotripsies, stents  . Rotator cuff disorder   . Spinal pain     Past Surgical History:  Procedure Laterality Date  . ANTERIOR FUSION CERVICAL SPINE    . FOOT SURGERY    . HERNIA REPAIR    . LITHOTRIPSY    . SHOULDER ARTHROSCOPY WITH ROTATOR CUFF REPAIR AND SUBACROMIAL DECOMPRESSION Right 12/28/2015   Procedure: SHOULDER ARTHROSCOPY TAKE DOWN ROTATOR CUFF REPAIR AND SUBACROMIAL DECOMPRESSION, DEBRIDEMENT OF SLAP TEAR;  Surgeon: Jones BroomJustin Chandler, MD;  Location: [facility];  Service: Orthopedics;  Laterality: Right;  SHOULDER ARTHROSCOPY TAKE DOWN ROTATOR CUFF REPAIR AND SUBACROMIAL DECOMPRESSION  . VASECTOMY Bilateral     There were no vitals filed for this visit.      Subjective Assessment - 02/25/16 1404    Subjective Pt thinks he is doing a little better today than he was earlier this week.     Currently in Pain? No/denies                         [facility] - 02/25/16 0001      Exercises   Exercises Shoulder     Shoulder Exercises: Supine   Internal Rotation Right;Strengthening;Weights  3x10   Internal Rotation Weight (lbs) 2   Other Supine Exercises 15 reps, red band, overhead, ER, horizontal abduct   Other Supine Exercises 30 reps elbow presses with hands behind head.      Shoulder Exercises: Sidelying   External Rotation Strengthening;Right;Weights  3x10 with towel under arm   External Rotation Weight (lbs) 1   Other Sidelying Exercises empty can in small ROM with 1#, 3x10     Shoulder Exercises: Pulleys   Flexion 2 minutes   ABduction 2 minutes     Shoulder Exercises: ROM/Strengthening   UBE (Upper Arm Bike) L1 x 4' Alt FWD/BWD     Shoulder Exercises: Isometric Strengthening   Flexion --  10x5sec standing   Extension --  10x5sec standing   External Rotation --  10x5" standing    ABduction --  10x5" standing     Shoulder Exercises: Stretch   Other Shoulder Stretches supine hands behind head x 60sec     Modalities   Modalities Electrical Stimulation;Vasopneumatic     Electrical Stimulation   Electrical Stimulation Location Rt shoulder   Electrical Stimulation Action IFC   Electrical Stimulation Parameters to tolerance   Electrical Stimulation Goals Pain     Vasopneumatic   Number Minutes Vasopneumatic  15 minutes   Vasopnuematic Location  Shoulder  PT Short Term Goals - 02/24/16 1315      PT SHORT TERM GOAL #1   Title Improve posture and alignment with patient engaging posterior shoulder girdle musculature 02/09/16   Time 6   Period Weeks   Status Achieved     PT SHORT TERM GOAL #2   Title Initiate AAROM/AROM exercises, progressing per protocol 02/09/16   Time 6   Period Weeks   Status Achieved     PT SHORT TERM GOAL #3   Title Education re posture; alignment; progressive exercise program; rehab 02/09/16   Time 6   Period Weeks   Status Achieved           PT Long Term Goals - 02/15/16 1437      PT LONG TERM GOAL #1   Title Improve posture and alignment with patient demonstrating posterior shoulder girlde control for UE function 03/08/16   Time  12   Period Weeks   Status On-going     PT LONG TERM GOAL #2   Title Improve Rt shoulder ROM to equal or greater than Lt shoulder ROM 03/08/16   Time 12   Period Weeks   Status On-going     PT LONG TERM GOAL #3   Title Rt shoulder strength =/> 5-/5 without pain 03/08/16   Time 12   Period Weeks   Status On-going     PT LONG TERM GOAL #4   Title Independent in HEP 03/08/16   Time 12   Period Weeks   Status On-going     PT LONG TERM GOAL #5   Title improve FOTO =/< 39% impaired 03/08/16   Time 12   Period Weeks   Status On-going               Plan - 02/25/16 1422    Clinical Impression Statement Robert Hines is tolerating slow increase in resistance training, approaching his 10 wks post op.  Shoulder is loosening back up from vacation .    Rehab Potential Good   PT Frequency 2x / week   PT Duration 12 weeks   PT Treatment/Interventions Patient/family education;ADLs/Self Care Home Management;Cryotherapy;Electrical Stimulation;Iontophoresis 4mg /ml Dexamethasone;Moist Heat;Traction;Ultrasound;Manual techniques;Dry needling;Therapeutic activities;Therapeutic exercise   PT Next Visit Plan complete FOTO for a status update, progress to 10 wks in protocol   Consulted and Agree with Plan of Care Patient      Patient will benefit from skilled therapeutic intervention in order to improve the following deficits and impairments:  Postural dysfunction, Improper body mechanics, Pain, Impaired UE functional use, Decreased range of motion, Decreased mobility, Decreased strength, Decreased activity tolerance  Visit Diagnosis: Pain in right shoulder  Muscle weakness (generalized)  Abnormal posture  Weakness of shoulder  Pain in joint, shoulder region, right     Problem List Patient Active Problem List   Diagnosis Date Noted  . Skin rash 02/23/2016  . Ingrown right big toenail 02/16/2016  . Obesity 01/23/2015  . Right shoulder pain 01/23/2015  . Former smoker 08/28/2014  .  Nephrolithiasis 07/25/2014  . Diabetes mellitus, type 2 (HCC) 07/25/2014  . Essential hypertension, benign 07/25/2014  . Annual physical exam 07/25/2014  . Hyperlipidemia 07/25/2014    Roderic Scarce PT  02/25/2016, 2:43 PM  [facility] [address] [place], [place], [address] Phone: [phone]   Fax:  [phone]  Name: Robert Hines MRN: [id number] Date of Birth: [birth year]

## 2016-02-26 ENCOUNTER — Encounter: Payer: Managed Care, Other (non HMO) | Admitting: Rehabilitative and Restorative Service Providers"

## 2016-02-29 ENCOUNTER — Ambulatory Visit (INDEPENDENT_AMBULATORY_CARE_PROVIDER_SITE_OTHER): Payer: Managed Care, Other (non HMO) | Admitting: Physical Therapy

## 2016-02-29 DIAGNOSIS — M25511 Pain in right shoulder: Secondary | ICD-10-CM | POA: Diagnosis not present

## 2016-02-29 DIAGNOSIS — R293 Abnormal posture: Secondary | ICD-10-CM

## 2016-02-29 DIAGNOSIS — R29898 Other symptoms and signs involving the musculoskeletal system: Secondary | ICD-10-CM | POA: Diagnosis not present

## 2016-02-29 DIAGNOSIS — M6281 Muscle weakness (generalized): Secondary | ICD-10-CM

## 2016-03-01 ENCOUNTER — Ambulatory Visit (INDEPENDENT_AMBULATORY_CARE_PROVIDER_SITE_OTHER): Payer: Managed Care, Other (non HMO) | Admitting: Sports Medicine

## 2016-03-01 DIAGNOSIS — L600 Ingrowing nail: Secondary | ICD-10-CM

## 2016-03-01 MED ORDER — DOXYCYCLINE HYCLATE 100 MG PO TABS: 100.0000 mg | ORAL_TABLET | Freq: Two times a day (BID) | ORAL | 0 refills | Status: AC

## 2016-03-01 NOTE — Therapy (Signed)
[place], [place], [address] Phone: [phone]   Fax:  [phone]  Physical Therapy Treatment  Patient Details  Name: Robert Hines MRN: [id number] Date of Birth: [birth year] Referring Provider: Dr. Tania Ade  Encounter Date: 02/29/2016      PT End of Session - 02/29/16 1439    Visit Number 15   Number of Visits 24   Date for PT Re-Evaluation 03/08/16   PT Start Time 5597   PT Stop Time 1532   PT Time Calculation (min) 60 min   Activity Tolerance Patient tolerated treatment well;No increased pain   Behavior During Therapy WFL for tasks assessed/performed      Past Medical History:  Diagnosis Date  . Arthritis    back  . Depression   . Diabetes mellitus without complication ([place])   . Hyperlipidemia   . Hypertension   . Renal disorder    calculi, multiple lithotripsies, stents  . Rotator cuff disorder   . Spinal pain     Past Surgical History:  Procedure Laterality Date  . ANTERIOR FUSION CERVICAL SPINE    . FOOT SURGERY    . HERNIA REPAIR    . LITHOTRIPSY    . SHOULDER ARTHROSCOPY WITH ROTATOR CUFF REPAIR AND SUBACROMIAL DECOMPRESSION Right 12/28/2015   Procedure: SHOULDER ARTHROSCOPY TAKE DOWN ROTATOR CUFF REPAIR AND SUBACROMIAL DECOMPRESSION, DEBRIDEMENT OF SLAP TEAR;  Surgeon: Tania Ade, MD;  Location: [facility];  Service: Orthopedics;  Laterality: Right;  SHOULDER ARTHROSCOPY TAKE DOWN ROTATOR CUFF REPAIR AND SUBACROMIAL DECOMPRESSION  . VASECTOMY Bilateral     There were no vitals filed for this visit.      Subjective Assessment - 02/29/16 1439    Subjective "I'm really sore from doing the band exercises 2x each day"   Currently in Pain? Yes   Pain Score 3    Pain Location Shoulder   Pain Orientation Right   Aggravating Factors  Rt arm behind back and overhead    Pain Relieving Factors ice.             [facility] PT Assessment - 03/01/16  0001      Assessment   Medical Diagnosis Rt RCR; DCR; SAD    Referring Provider Dr. Tania Ade   Onset Date/Surgical Date 12/28/15   Hand Dominance Right   Next MD Visit 03/09/16   Prior Therapy OPPT prior to sx     Observation/Other Assessments   Focus on Therapeutic Outcomes (FOTO)  38% limited.      AROM   Right Shoulder Extension 50 Degrees   Right Shoulder Flexion 142 Degrees  150 deg in supine    Right Shoulder ABduction 130 Degrees  standing   Right Shoulder External Rotation 65 Degrees  supine, shoulder abdct to ~70         [facility] Adult PT Treatment/Exercise - 03/01/16 0001      Shoulder Exercises: Supine   Other Supine Exercises 2 sets of 10 with yellow band:   overhead pull (flexion bilat), bilat ER, bilat horiz abduction, Rt UE Sash   Other Supine Exercises 30 reps elbow presses with hands behind head.      Shoulder Exercises: Pulleys   Flexion, Abduction  2 minutes each     Shoulder Exercises: ROM/Strengthening   UBE (Upper Arm Bike) L1 x 4' Alt FWD/BWD   Proximal Shoulder Strengthening, Supine O's and Xs with Rt shoulder at 90 deg flexion., 45 deg flexion, 105 deg  flexion.    Rhythmic Stabilization, Supine 45 deg, 90 deg of flexion x 30 sec x 2 reps each.      Shoulder Exercises: Stretch   Internal Rotation Stretch 3 reps  20 sec    Other Shoulder Stretches doorway stretch low and mid-level  x 20 sec each.      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   Electrical Stimulation Parameters to tolerance    Electrical Stimulation Goals Pain     Vasopneumatic   Number Minutes Vasopneumatic  15 minutes   Vasopnuematic Location  Shoulder   Vasopneumatic Pressure Low   Vasopneumatic Temperature  3*     Manual Therapy   Manual Therapy Soft tissue mobilization   Soft tissue mobilization Rt upper trap, levator and pec                   PT Short Term Goals - 02/24/16 1315       PT SHORT TERM GOAL #1   Title Improve posture and alignment with patient engaging posterior shoulder girdle musculature 02/09/16   Time 6   Period Weeks   Status Achieved     PT SHORT TERM GOAL #2   Title Initiate AAROM/AROM exercises, progressing per protocol 02/09/16   Time 6   Period Weeks   Status Achieved     PT SHORT TERM GOAL #3   Title Education re posture; alignment; progressive exercise program; rehab 02/09/16   Time 6   Period Weeks   Status Achieved           PT Long Term Goals - 03/01/16 1325      PT LONG TERM GOAL #1   Title Improve posture and alignment with patient demonstrating posterior shoulder girlde control for UE function 03/08/16   Time 12   Period Weeks     PT LONG TERM GOAL #2   Title Improve Rt shoulder ROM to equal or greater than Lt shoulder ROM 03/08/16   Time 12   Period Weeks   Status On-going     PT LONG TERM GOAL #3   Title Rt shoulder strength =/> 5-/5 without pain 03/08/16   Time 12   Period Weeks   Status On-going     PT LONG TERM GOAL #4   Title Independent in HEP 03/08/16   Time 12   Period Weeks   Status On-going     PT LONG TERM GOAL #5   Title improve FOTO =/< 39% impaired 03/08/16   Time 12   Period Weeks   Status Achieved               Plan - 02/29/16 1504    Clinical Impression Statement Pt tolerated Rt shoulder exercises with yellow band with less pain.  Rt shoulder ROM improved since last visit.  Pt reported decrease in Rt shoulder pain with exercise, and further reduction with use of estim/vaso at end of session. Has met FOTO goal.    Rehab Potential Good   PT Frequency 2x / week   PT Duration 12 weeks   PT Treatment/Interventions Patient/family education;ADLs/Self Care Home Management;Cryotherapy;Electrical Stimulation;Iontophoresis 44m/ml Dexamethasone;Moist Heat;Traction;Ultrasound;Manual techniques;Dry needling;Therapeutic activities;Therapeutic exercise      Patient will benefit from skilled  therapeutic intervention in order to improve the following deficits and impairments:  Postural dysfunction, Improper body mechanics, Pain, Impaired UE functional use, Decreased range of motion, Decreased mobility, Decreased strength, Decreased activity tolerance  Visit Diagnosis:  Pain in right shoulder  Muscle weakness (generalized)  Abnormal posture  Weakness of shoulder     Problem List Patient Active Problem List   Diagnosis Date Noted  . Skin rash 02/23/2016  . Ingrown right big toenail 02/16/2016  . Obesity 01/23/2015  . Right shoulder pain 01/23/2015  . Former smoker 08/28/2014  . Nephrolithiasis 07/25/2014  . Diabetes mellitus, type 2 ([place]) 07/25/2014  . Essential hypertension, benign 07/25/2014  . Annual physical exam 07/25/2014  . Hyperlipidemia 07/25/2014    Kerin Perna, PTA 03/01/16 1:25 PM  [facility] [place], [place], [address] Phone: [phone]   Fax:  [phone]  Name: HARI CASAUS MRN: [id number] Date of Birth: 1956-03-26

## 2016-03-01 NOTE — Progress Notes (Signed)
  Subjective: 1 week post right great toenail excision. Doing well.  Objective: General: Well-developed, well-nourished, and in no acute distress. Right foot: Only minimal erythema at the eponychium, otherwise looks great.  Assessment/plan:   Ingrown right big toenail Wound looks good, there is a bit of erythema at the eponychium. Adding doxycycline for this, return to see me in 2 months.

## 2016-03-01 NOTE — Assessment & Plan Note (Signed)
Wound looks good, there is a bit of erythema at the eponychium. Adding doxycycline for this, return to see me in 2 months.

## 2016-03-03 ENCOUNTER — Ambulatory Visit (INDEPENDENT_AMBULATORY_CARE_PROVIDER_SITE_OTHER): Payer: Managed Care, Other (non HMO) | Admitting: Physical Therapy

## 2016-03-03 DIAGNOSIS — M6281 Muscle weakness (generalized): Secondary | ICD-10-CM | POA: Diagnosis not present

## 2016-03-03 DIAGNOSIS — M25511 Pain in right shoulder: Secondary | ICD-10-CM | POA: Diagnosis not present

## 2016-03-03 DIAGNOSIS — R29898 Other symptoms and signs involving the musculoskeletal system: Secondary | ICD-10-CM | POA: Diagnosis not present

## 2016-03-03 DIAGNOSIS — R293 Abnormal posture: Secondary | ICD-10-CM

## 2016-03-03 NOTE — Therapy (Signed)
[facility] [address] [place], [place], [address] Phone: [phone]   Fax:  [phone]  Physical Therapy Treatment  Patient Details  Name: Robert BertinGary E Mohon MRN: [id number] Date of Birth: 10-Sep-1955 Referring Provider: Dr. Jones BroomJustin Chandler  Encounter Date: 03/03/2016      PT End of Session - 03/03/16 1448    Visit Number 16   Number of Visits 24   Date for PT Re-Evaluation 03/08/16   PT Start Time 1442   PT Stop Time 1541   PT Time Calculation (min) 59 min   Activity Tolerance Patient tolerated treatment well      Past Medical History:  Diagnosis Date  . Arthritis    back  . Depression   . Diabetes mellitus without complication (HCC)   . Hyperlipidemia   . Hypertension   . Renal disorder    calculi, multiple lithotripsies, stents  . Rotator cuff disorder   . Spinal pain     Past Surgical History:  Procedure Laterality Date  . ANTERIOR FUSION CERVICAL SPINE    . FOOT SURGERY    . HERNIA REPAIR    . LITHOTRIPSY    . SHOULDER ARTHROSCOPY WITH ROTATOR CUFF REPAIR AND SUBACROMIAL DECOMPRESSION Right 12/28/2015   Procedure: SHOULDER ARTHROSCOPY TAKE DOWN ROTATOR CUFF REPAIR AND SUBACROMIAL DECOMPRESSION, DEBRIDEMENT OF SLAP TEAR;  Surgeon: Jones BroomJustin Chandler, MD;  Location: [facility];  Service: Orthopedics;  Laterality: Right;  SHOULDER ARTHROSCOPY TAKE DOWN ROTATOR CUFF REPAIR AND SUBACROMIAL DECOMPRESSION  . VASECTOMY Bilateral     There were no vitals filed for this visit.          [facility] PT Assessment - 03/03/16 0001      Assessment   Medical Diagnosis Rt RCR; DCR; SAD    Referring Provider Dr. Jones BroomJustin Chandler   Onset Date/Surgical Date 12/28/15   Hand Dominance Right   Next MD Visit 03/09/16   Prior Therapy OPPT prior to sx     PROM   Right Shoulder External Rotation 70 Degrees          [facility] Adult PT Treatment/Exercise - 03/03/16 0001      Self-Care   Self-Care Other  Self-Care Comments   Other Self-Care Comments  Pt instructed in self massage with ball to periscapular area.  Pt returned demo .      Shoulder Exercises: Supine   Horizontal ABduction Strengthening;Both;10 reps;Theraband   Theraband Level (Shoulder Horizontal ABduction) Level 1 (Yellow)  2 sets   External Rotation Strengthening;Both;10 reps;Theraband   Theraband Level (Shoulder External Rotation) Level 1 (Yellow)  2 sets   Flexion Strengthening;10 reps;Theraband  overhead pull, 2 sets   Theraband Level (Shoulder Flexion) Level 1 (Yellow)   Other Supine Exercises Rt sash with yellow band x 10 reps x 2 sets      Shoulder Exercises: Standing   Other Standing Exercises Wall ladder with RUE in flexion and abduction x 8 reps (up to top of ladder)      Shoulder Exercises: Pulleys   Flexion 3 minutes   ABduction 2 minutes     Shoulder Exercises: Stretch   Other Shoulder Stretches doorway stretch low and mid-level  x 20 sec each.      Financial risk analystlectrical Stimulation   Electrical Stimulation Location Rt shoulder    Electrical Stimulation Action IFC   Electrical Stimulation Parameters to tolerance    Electrical Stimulation Goals Pain     Vasopneumatic   Number Minutes Vasopneumatic  15 minutes  Vasopnuematic Location  Shoulder   Vasopneumatic Pressure Low   Vasopneumatic Temperature  3*     Manual Therapy   Myofascial Release Rt pec release    Passive ROM Rt shoulder flexion; abduction; IR; ER                   PT Short Term Goals - 02/24/16 1315      PT SHORT TERM GOAL #1   Title Improve posture and alignment with patient engaging posterior shoulder girdle musculature 02/09/16   Time 6   Period Weeks   Status Achieved     PT SHORT TERM GOAL #2   Title Initiate AAROM/AROM exercises, progressing per protocol 02/09/16   Time 6   Period Weeks   Status Achieved     PT SHORT TERM GOAL #3   Title Education re posture; alignment; progressive exercise program; rehab 02/09/16    Time 6   Period Weeks   Status Achieved           PT Long Term Goals - 03/01/16 1325      PT LONG TERM GOAL #1   Title Improve posture and alignment with patient demonstrating posterior shoulder girlde control for UE function 03/08/16   Time 12   Period Weeks     PT LONG TERM GOAL #2   Title Improve Rt shoulder ROM to equal or greater than Lt shoulder ROM 03/08/16   Time 12   Period Weeks   Status On-going     PT LONG TERM GOAL #3   Title Rt shoulder strength =/> 5-/5 without pain 03/08/16   Time 12   Period Weeks   Status On-going     PT LONG TERM GOAL #4   Title Independent in HEP 03/08/16   Time 12   Period Weeks   Status On-going     PT LONG TERM GOAL #5   Title improve FOTO =/< 39% impaired 03/08/16   Time 12   Period Weeks   Status Achieved               Plan - 03/03/16 1528    Clinical Impression Statement Pt reported mild increase in pain during exercise, up to 4/10, reduced with rest.  Pt demonstrated improved Rt shoulder ER ROM this date.  Progressing well towards established goals.    Rehab Potential Good   PT Frequency 2x / week   PT Duration 12 weeks   PT Treatment/Interventions Patient/family education;ADLs/Self Care Home Management;Cryotherapy;Electrical Stimulation;Iontophoresis 4mg /ml Dexamethasone;Moist Heat;Traction;Ultrasound;Manual techniques;Dry needling;Therapeutic activities;Therapeutic exercise   PT Next Visit Plan write MD note, complete FOTO for a status update, progress to 10 wks in protocol   Consulted and Agree with Plan of Care Patient      Patient will benefit from skilled therapeutic intervention in order to improve the following deficits and impairments:  Postural dysfunction, Improper body mechanics, Pain, Impaired UE functional use, Decreased range of motion, Decreased mobility, Decreased strength, Decreased activity tolerance  Visit Diagnosis: Pain in right shoulder  Muscle weakness (generalized)  Abnormal  posture  Weakness of shoulder     Problem List Patient Active Problem List   Diagnosis Date Noted  . Skin rash 02/23/2016  . Ingrown right big toenail 02/16/2016  . Obesity 01/23/2015  . Right shoulder pain 01/23/2015  . Former smoker 08/28/2014  . Nephrolithiasis 07/25/2014  . Diabetes mellitus, type 2 (HCC) 07/25/2014  . Essential hypertension, benign 07/25/2014  . Annual physical exam 07/25/2014  . Hyperlipidemia 07/25/2014  Salvadore Oxford 03/03/2016, 3:32 PM  [facility] [address] [place], [place], [address] Phone: [phone]   Fax:  [phone]  Name: DYLANN GALLIER MRN: [id number] Date of Birth: February 23, 1956

## 2016-03-07 ENCOUNTER — Ambulatory Visit (INDEPENDENT_AMBULATORY_CARE_PROVIDER_SITE_OTHER): Payer: Managed Care, Other (non HMO) | Admitting: Physical Therapy

## 2016-03-07 DIAGNOSIS — M25511 Pain in right shoulder: Secondary | ICD-10-CM | POA: Diagnosis not present

## 2016-03-07 DIAGNOSIS — R293 Abnormal posture: Secondary | ICD-10-CM

## 2016-03-07 DIAGNOSIS — M6281 Muscle weakness (generalized): Secondary | ICD-10-CM

## 2016-03-07 DIAGNOSIS — R29898 Other symptoms and signs involving the musculoskeletal system: Secondary | ICD-10-CM

## 2016-03-07 NOTE — Therapy (Signed)
[facility] [address] [place], [place], [address] Phone: [phone]   Fax:  [phone]  Physical Therapy Treatment  Patient Details  Name: Robert Hines MRN: [id number] Date of Birth: 1956/02/21 Referring Provider: Dr. Jones Broom  Encounter Date: 03/07/2016      PT End of Session - 03/07/16 1549    Visit Number 17   Number of Visits 24   Date for PT Re-Evaluation 03/08/16   PT Start Time 1509   PT Stop Time 1607   PT Time Calculation (min) 58 min      Past Medical History:  Diagnosis Date  . Arthritis    back  . Depression   . Diabetes mellitus without complication (HCC)   . Hyperlipidemia   . Hypertension   . Renal disorder    calculi, multiple lithotripsies, stents  . Rotator cuff disorder   . Spinal pain     Past Surgical History:  Procedure Laterality Date  . ANTERIOR FUSION CERVICAL SPINE    . FOOT SURGERY    . HERNIA REPAIR    . LITHOTRIPSY    . SHOULDER ARTHROSCOPY WITH ROTATOR CUFF REPAIR AND SUBACROMIAL DECOMPRESSION Right 12/28/2015   Procedure: SHOULDER ARTHROSCOPY TAKE DOWN ROTATOR CUFF REPAIR AND SUBACROMIAL DECOMPRESSION, DEBRIDEMENT OF SLAP TEAR;  Surgeon: Jones Broom, MD;  Location: [facility];  Service: Orthopedics;  Laterality: Right;  SHOULDER ARTHROSCOPY TAKE DOWN ROTATOR CUFF REPAIR AND SUBACROMIAL DECOMPRESSION  . VASECTOMY Bilateral     There were no vitals filed for this visit.      Subjective Assessment - 03/07/16 1513    Subjective Pt reports his Rt shoulder pain has increased over the last 4 days. He is taking pain medicine to sleep through night because pain is waking him when he rolls onto Rt side.    Pertinent History HTN; cervical fusion ~ 7 yrs ago; HNP cervical spine no surgery ~5 yrs ago    Currently in Pain? Yes   Pain Score 2    Pain Location Shoulder   Pain Orientation Right   Pain Descriptors / Indicators Aching;Dull   Aggravating  Factors  Rt arm behind back, overhead, and when rolling on to it at night    Pain Relieving Factors ice.             [facility] PT Assessment - 03/07/16 0001      Assessment   Medical Diagnosis Rt RCR; DCR; SAD    Referring Provider Dr. Jones Broom   Onset Date/Surgical Date 12/28/15   Hand Dominance Right   Next MD Visit 03/09/16   Prior Therapy OPPT prior to sx     AROM   Right Shoulder Extension 48 Degrees   Right Shoulder Flexion 140 Degrees  standing, 150 deg in supine   Right Shoulder ABduction 130 Degrees  standing   Right Shoulder External Rotation 65 Degrees  supine, shoulder abdct to ~70     PROM   Right Shoulder External Rotation 72 Degrees          [facility] Adult PT Treatment/Exercise - 03/07/16 0001      Shoulder Exercises: Supine   Horizontal ABduction Strengthening;Both;10 reps;Theraband   Theraband Level (Shoulder Horizontal ABduction) Level 1 (Yellow)  2 sets   External Rotation Strengthening;Both;10 reps;Theraband   Theraband Level (Shoulder External Rotation) Level 1 (Yellow)  2 sets   Flexion Strengthening;10 reps;Theraband  overhead pull, 2 sets   Theraband Level (Shoulder Flexion) Level 1 (Yellow)  Other Supine Exercises Rt sash with yellow band x 10 reps x 2 sets      Shoulder Exercises: Prone   Flexion Right;10 reps   Horizontal ABduction 1 Right;10 reps;Weights  palm down.    Horizontal ABduction 1 Weight (lbs) 1#   Other Prone Exercises Prone Rt row x 10 reps, with scap squeeze (2# in hand)     Shoulder Exercises: Pulleys   Flexion 3 minutes   ABduction 2 minutes   Other Pulley Exercises IR 5 reps x 10 sec hold.   tactile/VC for form.      Shoulder Exercises: ROM/Strengthening   UBE (Upper Arm Bike) L2: 1.5 min each direction    Rhythmic Stabilization, Supine 45 deg, 90 deg of flexion x 30 sec x 2 reps each.      Shoulder Exercises: Stretch   Other Shoulder Stretches doorway stretch low and mid-level  x 20 sec each.       Modalities   Modalities Proofreaderlectrical Stimulation;Vasopneumatic     Electrical Stimulation   Electrical Stimulation Location Rt shoulder    Electrical Stimulation Action IFC   Electrical Stimulation Parameters to tolerance    Electrical Stimulation Goals Pain     Vasopneumatic   Number Minutes Vasopneumatic  15 minutes   Vasopnuematic Location  Shoulder   Vasopneumatic Pressure Low   Vasopneumatic Temperature  3*     Manual Therapy   Soft tissue mobilization Rt upper trap, levator and pec    Myofascial Release Rt pec release    Passive ROM Rt shoulder ER                  PT Short Term Goals - 02/24/16 1315      PT SHORT TERM GOAL #1   Title Improve posture and alignment with patient engaging posterior shoulder girdle musculature 02/09/16   Time 6   Period Weeks   Status Achieved     PT SHORT TERM GOAL #2   Title Initiate AAROM/AROM exercises, progressing per protocol 02/09/16   Time 6   Period Weeks   Status Achieved     PT SHORT TERM GOAL #3   Title Education re posture; alignment; progressive exercise program; rehab 02/09/16   Time 6   Period Weeks   Status Achieved           PT Long Term Goals - 03/07/16 1557      PT LONG TERM GOAL #1   Title Improve posture and alignment with patient demonstrating posterior shoulder girdle control for UE function 03/08/16   Time 12   Period Weeks   Status On-going     PT LONG TERM GOAL #2   Title Improve Rt shoulder ROM to equal or greater than Lt shoulder ROM 03/08/16   Time 12   Period Weeks   Status On-going     PT LONG TERM GOAL #3   Title Rt shoulder strength =/> 5-/5 without pain 03/08/16   Time 12   Period Weeks   Status On-going     PT LONG TERM GOAL #4   Title Independent in HEP 03/08/16   Time 12   Period Weeks   Status On-going     PT LONG TERM GOAL #5   Title improve FOTO =/< 39% impaired 03/08/16   Time 12   Period Weeks   Status Achieved               Plan - 03/07/16 1558     Clinical Impression  Statement Pt's Rt shoulder ROM and strength slowly improving.  Pt continues with limited ER and abduction strength and motion.  Pt tolerated increased resistance with prone shoulder exercise without increase in symptoms. Pt is progressing well towards unmet goals.  Pt will benefit from continued PT intervention to maximize functional mobility independence.      Rehab Potential Good   PT Frequency 2x / week   PT Duration 12 weeks   PT Treatment/Interventions Patient/family education;ADLs/Self Care Home Management;Cryotherapy;Electrical Stimulation;Iontophoresis 4mg /ml Dexamethasone;Moist Heat;Traction;Ultrasound;Manual techniques;Dry needling;Therapeutic activities;Therapeutic exercise   PT Next Visit Plan Continue progressive ROM and strengthening for Rt shoulder.    Consulted and Agree with Plan of Care Patient      Patient will benefit from skilled therapeutic intervention in order to improve the following deficits and impairments:  Postural dysfunction, Improper body mechanics, Pain, Impaired UE functional use, Decreased range of motion, Decreased mobility, Decreased strength, Decreased activity tolerance  Visit Diagnosis: Pain in right shoulder  Muscle weakness (generalized)  Abnormal posture  Weakness of shoulder     Problem List Patient Active Problem List   Diagnosis Date Noted  . Skin rash 02/23/2016  . Ingrown right big toenail 02/16/2016  . Obesity 01/23/2015  . Right shoulder pain 01/23/2015  . Former smoker 08/28/2014  . Nephrolithiasis 07/25/2014  . Diabetes mellitus, type 2 (HCC) 07/25/2014  . Essential hypertension, benign 07/25/2014  . Annual physical exam 07/25/2014  . Hyperlipidemia 07/25/2014   Mayer Camel, PTA 03/07/16 4:03 PM  [facility] [address] [place], [place], [address] Phone: [phone]   Fax:  [phone]  Name: Robert Hines MRN: [id number] Date  of Birth: 1955/08/23

## 2016-03-16 ENCOUNTER — Encounter: Payer: Self-pay | Admitting: Rehabilitative and Restorative Service Providers"

## 2016-03-16 ENCOUNTER — Ambulatory Visit (INDEPENDENT_AMBULATORY_CARE_PROVIDER_SITE_OTHER): Payer: Managed Care, Other (non HMO) | Admitting: Rehabilitative and Restorative Service Providers"

## 2016-03-16 DIAGNOSIS — R293 Abnormal posture: Secondary | ICD-10-CM | POA: Diagnosis not present

## 2016-03-16 DIAGNOSIS — M6281 Muscle weakness (generalized): Secondary | ICD-10-CM | POA: Diagnosis not present

## 2016-03-16 DIAGNOSIS — M25511 Pain in right shoulder: Secondary | ICD-10-CM | POA: Diagnosis not present

## 2016-03-16 NOTE — Therapy (Signed)
[facility] [address] [place], [place], [address] Phone: [phone]   Fax:  [phone]  Physical Therapy Treatment  Patient Details  Name: Robert Hines MRN: [id number] Date of Birth: 1956-01-01 Referring Provider: Dr Jones BroomJustin Chandler   Encounter Date: 03/16/2016      PT End of Session - 03/16/16 1425    Visit Number 18   Number of Visits 24   Date for PT Re-Evaluation 04/27/16   PT Start Time 1425   PT Stop Time 1521   PT Time Calculation (min) 56 min   Activity Tolerance Patient tolerated treatment well      Past Medical History:  Diagnosis Date  . Arthritis    back  . Depression   . Diabetes mellitus without complication (HCC)   . Hyperlipidemia   . Hypertension   . Renal disorder    calculi, multiple lithotripsies, stents  . Rotator cuff disorder   . Spinal pain     Past Surgical History:  Procedure Laterality Date  . ANTERIOR FUSION CERVICAL SPINE    . FOOT SURGERY    . HERNIA REPAIR    . LITHOTRIPSY    . SHOULDER ARTHROSCOPY WITH ROTATOR CUFF REPAIR AND SUBACROMIAL DECOMPRESSION Right 12/28/2015   Procedure: SHOULDER ARTHROSCOPY TAKE DOWN ROTATOR CUFF REPAIR AND SUBACROMIAL DECOMPRESSION, DEBRIDEMENT OF SLAP TEAR;  Surgeon: Jones BroomJustin Chandler, MD;  Location: [facility];  Service: Orthopedics;  Laterality: Right;  SHOULDER ARTHROSCOPY TAKE DOWN ROTATOR CUFF REPAIR AND SUBACROMIAL DECOMPRESSION  . VASECTOMY Bilateral     There were no vitals filed for this visit.      Subjective Assessment - 03/16/16 1429    Subjective Patient was seen by MD and cleared for return to work Monday 03/21/16. Employer will accomodate the restrictions - limited lifting to 10 # with both hands/no restrictioins on the Lt UE. Patient can limit activities in his work setting. Patient is concerned because he continues to have pain and the shoulder pain is awakening him at night again. He has tightness and  weakness in the Rt UE. MD wants pt to continue PT but he did not give him a time frame for PT.    Currently in Pain? No/denies   Pain Location Shoulder   Pain Orientation Right   Pain Descriptors / Indicators Dull   Pain Type Chronic pain   Pain Onset More than a month ago   Pain Frequency Intermittent            [facility] PT Assessment - 03/16/16 0001      Assessment   Medical Diagnosis Rt RCR; DCR; SAD    Referring Provider Dr Jones BroomJustin Chandler    Onset Date/Surgical Date 12/28/15   Hand Dominance Right   Next MD Visit 04/20/16   Prior Therapy OPPT prior to sx     AROM   Right Shoulder Extension 48 Degrees   Right Shoulder Flexion 140 Degrees  standing, 150 deg in supine   Right Shoulder ABduction 130 Degrees  standing   Right Shoulder External Rotation 65 Degrees  supine, shoulder abdct to ~70     PROM   Right Shoulder External Rotation 74 Degrees                     [facility] Adult PT Treatment/Exercise - 03/16/16 0001      Shoulder Exercises: Standing   External Rotation Strengthening;Right;10 reps;Theraband   Theraband Level (Shoulder External Rotation) Level 1 (Yellow)  added  step out holding into neutral as pt steps to Rt red TB   Extension Strengthening;Both;10 reps;Theraband   Theraband Level (Shoulder Extension) Level 2 (Red)   Row Strengthening;Both;20 reps   Theraband Level (Shoulder Row) Level 2 (Red)   Retraction Strengthening;Both;20 reps;Theraband   Theraband Level (Shoulder Retraction) Level 1 (Yellow)     Shoulder Exercises: Pulleys   Flexion --  10 sec x 15   ABduction --  10 sec x 15   Other Pulley Exercises IR 5 reps x 10 sec hold.   tactile/VC for form.      Shoulder Exercises: Isometric Strengthening   ABduction --  standing abd against wall 10 sec x 10      Shoulder Exercises: Stretch   Corner Stretch 30 seconds     Modalities   Modalities Electrical Stimulation;Vasopneumatic     Cryotherapy   Number Minutes Cryotherapy  15 Minutes   Cryotherapy Location Shoulder  Lt   Type of Cryotherapy Ice pack     Electrical Stimulation   Electrical Stimulation Location Rt shoulder   Electrical Stimulation Action IFC   Electrical Stimulation Parameters to tolerance   Electrical Stimulation Goals Pain;Tone     Manual Therapy   Soft tissue mobilization pecs/trap/leveator   Passive ROM Rt shoulder ER  lying on back with hands behind head 1-2 min                 PT Education - 03/16/16 1518    Education provided Yes   Education Details HEP    Person(s) Educated Patient   Methods Explanation;Demonstration;Tactile cues;Verbal cues;Handout   Comprehension Verbalized understanding;Returned demonstration;Verbal cues required;Tactile cues required          PT Short Term Goals - 02/24/16 1315      PT SHORT TERM GOAL #1   Title Improve posture and alignment with patient engaging posterior shoulder girdle musculature 02/09/16   Time 6   Period Weeks   Status Achieved     PT SHORT TERM GOAL #2   Title Initiate AAROM/AROM exercises, progressing per protocol 02/09/16   Time 6   Period Weeks   Status Achieved     PT SHORT TERM GOAL #3   Title Education re posture; alignment; progressive exercise program; rehab 02/09/16   Time 6   Period Weeks   Status Achieved           PT Long Term Goals - 03/16/16 1438      PT LONG TERM GOAL #1   Title Improve posture and alignment with patient demonstrating posterior shoulder girdle control for UE function 04/27/16   Time 18   Period Weeks   Status Revised     PT LONG TERM GOAL #2   Title Improve Rt shoulder ROM to equal or greater than Lt shoulder ROM 04/27/16   Time 18   Period Weeks   Status Revised     PT LONG TERM GOAL #3   Title Rt shoulder strength =/> 5-/5 without pain 04/27/16   Time 18   Period Weeks   Status Revised     PT LONG TERM GOAL #4   Title Independent in advanced HEP 04/27/16   Time 18   Period Weeks   Status Revised     PT  LONG TERM GOAL #5   Title improve FOTO =/< 39% impaired 04/27/16   Time 18   Period Weeks   Status Revised  Plan - 03/16/16 1519    Clinical Impression Statement Gradually porgressing with shoulder rehab. continues to have limitations in ROM and strength Lt shoudler. Needs to improve scapular ans shoulder stability and strength and progress with strengthening.    Rehab Potential Good   PT Frequency 2x / week   PT Duration Other (comment)   PT Treatment/Interventions Patient/family education;ADLs/Self Care Home Management;Cryotherapy;Electrical Stimulation;Iontophoresis 4mg /ml Dexamethasone;Moist Heat;Traction;Ultrasound;Manual techniques;Dry needling;Therapeutic activities;Therapeutic exercise   PT Next Visit Plan Continue progressive ROM and strengthening for Rt shoulder.    Consulted and Agree with Plan of Care Patient      Patient will benefit from skilled therapeutic intervention in order to improve the following deficits and impairments:  Postural dysfunction, Improper body mechanics, Pain, Impaired UE functional use, Decreased range of motion, Decreased mobility, Decreased strength, Decreased activity tolerance  Visit Diagnosis: Pain in right shoulder - Plan: PT plan of care cert/re-cert  Muscle weakness (generalized) - Plan: PT plan of care cert/re-cert  Abnormal posture - Plan: PT plan of care cert/re-cert     Problem List Patient Active Problem List   Diagnosis Date Noted  . Skin rash 02/23/2016  . Ingrown right big toenail 02/16/2016  . Obesity 01/23/2015  . Right shoulder pain 01/23/2015  . Former smoker 08/28/2014  . Nephrolithiasis 07/25/2014  . Diabetes mellitus, type 2 (HCC) 07/25/2014  . Essential hypertension, benign 07/25/2014  . Annual physical exam 07/25/2014  . Hyperlipidemia 07/25/2014    Hortense Cantrall Rober Minion PT, MPH  03/16/2016, 3:24 PM  [facility] [address] [place], [place], [address] Phone: [phone]   Fax:  [phone]  Name: Robert Hines MRN: [id number] Date of Birth: [birth year]

## 2016-03-16 NOTE — Patient Instructions (Addendum)
External Rotator Cuff Stretch, Supine    Lie supine, fingers clasped behind head, elbows close together. Pull elbows backward while pinching shoulder blades. Hold _30-60__ seconds. Repeat _2-3__ times per session. Do _1-2__ sessions per day.   Lying on back with arm out to side at 90 degrees; arm supported on bed Place red band around palm with 1# weight hanging at about 2 feet from hand -  Let arm stretch back holding for 1-2 min working toward 5 min   Flexibility: Research officer, political partyCorner Stretch   Weight on legs not arms  Standing in corner with hands just above shoulder level and feet ____ inches from corner, lean forward until a comfortable stretch is felt across chest. Hold __20-30__ seconds. Repeat __3__ times per set. Do __2-3__ sessions per day.   Standing with band at forearm step to right  10 reps 2-3 sets of 10    Strengthening: Isometric Abduction    Using wall for resistance, press left arm into ball using light pressure. Hold __5-8__ seconds. Repeat _5-10___ times per set. Do __1-3__ sets per session. Do __1-2__ sessions per day.

## 2016-03-18 ENCOUNTER — Encounter: Payer: Self-pay | Admitting: Rehabilitative and Restorative Service Providers"

## 2016-03-18 ENCOUNTER — Ambulatory Visit (INDEPENDENT_AMBULATORY_CARE_PROVIDER_SITE_OTHER): Payer: Managed Care, Other (non HMO) | Admitting: Rehabilitative and Restorative Service Providers"

## 2016-03-18 DIAGNOSIS — R29898 Other symptoms and signs involving the musculoskeletal system: Secondary | ICD-10-CM

## 2016-03-18 DIAGNOSIS — M25511 Pain in right shoulder: Secondary | ICD-10-CM

## 2016-03-18 DIAGNOSIS — R293 Abnormal posture: Secondary | ICD-10-CM

## 2016-03-18 DIAGNOSIS — M6281 Muscle weakness (generalized): Secondary | ICD-10-CM | POA: Diagnosis not present

## 2016-03-18 NOTE — Therapy (Signed)
[facility] [address] [place], [place], [address] Phone: [phone]   Fax:  [phone]  Physical Therapy Treatment  Patient Details  Name: Robert Hines MRN: [id number] Date of Birth: 03-28-1956 Referring Provider: Dr Jones BroomJustin Chandler   Encounter Date: 03/18/2016      PT End of Session - 03/18/16 1546    Visit Number 19   Number of Visits 24   Date for PT Re-Evaluation 04/27/16   PT Start Time 1530   PT Stop Time 1620   PT Time Calculation (min) 50 min   Activity Tolerance Patient tolerated treatment well      Past Medical History:  Diagnosis Date  . Arthritis    back  . Depression   . Diabetes mellitus without complication (HCC)   . Hyperlipidemia   . Hypertension   . Renal disorder    calculi, multiple lithotripsies, stents  . Rotator cuff disorder   . Spinal pain     Past Surgical History:  Procedure Laterality Date  . ANTERIOR FUSION CERVICAL SPINE    . FOOT SURGERY    . HERNIA REPAIR    . LITHOTRIPSY    . SHOULDER ARTHROSCOPY WITH ROTATOR CUFF REPAIR AND SUBACROMIAL DECOMPRESSION Right 12/28/2015   Procedure: SHOULDER ARTHROSCOPY TAKE DOWN ROTATOR CUFF REPAIR AND SUBACROMIAL DECOMPRESSION, DEBRIDEMENT OF SLAP TEAR;  Surgeon: Jones BroomJustin Chandler, MD;  Location: [facility];  Service: Orthopedics;  Laterality: Right;  SHOULDER ARTHROSCOPY TAKE DOWN ROTATOR CUFF REPAIR AND SUBACROMIAL DECOMPRESSION  . VASECTOMY Bilateral     There were no vitals filed for this visit.      Subjective Assessment - 03/18/16 1547    Subjective Working on new exercises and rigged u pa pully for home. Some increaed soreness in the rt shoudler today. Still having trouble sleeping at night    Currently in Pain? No/denies   Pain Score 0-No pain  pain with movement    Pain Location Shoulder   Pain Orientation Right   Pain Descriptors / Indicators Dull;Aching                         [facility]  Adult PT Treatment/Exercise - 03/18/16 0001      Shoulder Exercises: Standing   External Rotation Strengthening;Right;10 reps;Theraband   Theraband Level (Shoulder External Rotation) Level 1 (Yellow)  added step out holding into neutral as pt steps to Rt red TB   Retraction Strengthening;Both;20 reps;Theraband   Theraband Level (Shoulder Retraction) Level 1 (Yellow)     Shoulder Exercises: Pulleys   Flexion --  10 sec x 15   ABduction --  10 sec x 15     Cryotherapy   Number Minutes Cryotherapy 17 Minutes   Cryotherapy Location Shoulder  Lt   Type of Cryotherapy Ice pack     Electrical Stimulation   Electrical Stimulation Location Rt shoulder   Electrical Stimulation Action IFC   Electrical Stimulation Parameters  to tolerance   Electrical Stimulation Goals Pain;Tone     Manual Therapy   Manual therapy comments pt supine    Joint Mobilization Rt GH joint - Grade II/III inferior and posterior/anterior; thoracic spine mobilization CPA and Rt UPA Grade II/III pt in sitting    Soft tissue mobilization pecs/trap/leveator   Scapular Mobilization Rt   Passive ROM Rt shoulder flex/ER/IR/horizontal abduction/extension   lying on back with hands behind head 1-2 min  PT Education - 03/18/16 1616    Education provided Yes   Education Details review of HEP - strengthening and stretching    Person(s) Educated Patient   Methods Explanation   Comprehension Verbalized understanding          PT Short Term Goals - 02/24/16 1315      PT SHORT TERM GOAL #1   Title Improve posture and alignment with patient engaging posterior shoulder girdle musculature 02/09/16   Time 6   Period Weeks   Status Achieved     PT SHORT TERM GOAL #2   Title Initiate AAROM/AROM exercises, progressing per protocol 02/09/16   Time 6   Period Weeks   Status Achieved     PT SHORT TERM GOAL #3   Title Education re posture; alignment; progressive exercise program; rehab 02/09/16    Time 6   Period Weeks   Status Achieved           PT Long Term Goals - 03/16/16 1438      PT LONG TERM GOAL #1   Title Improve posture and alignment with patient demonstrating posterior shoulder girdle control for UE function 04/27/16   Time 18   Period Weeks   Status Revised     PT LONG TERM GOAL #2   Title Improve Rt shoulder ROM to equal or greater than Lt shoulder ROM 04/27/16   Time 18   Period Weeks   Status Revised     PT LONG TERM GOAL #3   Title Rt shoulder strength =/> 5-/5 without pain 04/27/16   Time 18   Period Weeks   Status Revised     PT LONG TERM GOAL #4   Title Independent in advanced HEP 04/27/16   Time 18   Period Weeks   Status Revised     PT LONG TERM GOAL #5   Title improve FOTO =/< 39% impaired 04/27/16   Time 18   Period Weeks   Status Revised               Plan - 03/18/16 1616    Clinical Impression Statement Decreased thoracic spinal moblity and Rt GH moblity with mobs. Worked today on joint mobilization and PROM/stretching in multiple planes. Patient tolerated treatment well     Rehab Potential Good   PT Frequency 2x / week   PT Duration Other (comment)   PT Treatment/Interventions Patient/family education;ADLs/Self Care Home Management;Cryotherapy;Electrical Stimulation;Iontophoresis 4mg /ml Dexamethasone;Moist Heat;Traction;Ultrasound;Manual techniques;Dry needling;Therapeutic activities;Therapeutic exercise   PT Next Visit Plan Continue progressive ROM and strengthening for Rt shoulder. Assess response to joint mobs and PROM - continue focus on ROM    Consulted and Agree with Plan of Care Patient      Patient will benefit from skilled therapeutic intervention in order to improve the following deficits and impairments:  Postural dysfunction, Improper body mechanics, Pain, Impaired UE functional use, Decreased range of motion, Decreased mobility, Decreased strength, Decreased activity tolerance  Visit Diagnosis: Pain in right  shoulder  Muscle weakness (generalized)  Abnormal posture  Weakness of shoulder     Problem List Patient Active Problem List   Diagnosis Date Noted  . Skin rash 02/23/2016  . Ingrown right big toenail 02/16/2016  . Obesity 01/23/2015  . Right shoulder pain 01/23/2015  . Former smoker 08/28/2014  . Nephrolithiasis 07/25/2014  . Diabetes mellitus, type 2 (HCC) 07/25/2014  . Essential hypertension, benign 07/25/2014  . Annual physical exam 07/25/2014  . Hyperlipidemia 07/25/2014    Jakayla Schweppe P Leonor Liv  PT, MPH  03/18/2016, 4:20 PM  [facility] [address] [place], [place], [address] Phone: [phone]   Fax:  [phone]  Name: Robert Hines MRN: [id number] Date of Birth: [birth year]

## 2016-03-21 ENCOUNTER — Ambulatory Visit (INDEPENDENT_AMBULATORY_CARE_PROVIDER_SITE_OTHER): Payer: Managed Care, Other (non HMO) | Admitting: Physical Therapy

## 2016-03-21 DIAGNOSIS — R293 Abnormal posture: Secondary | ICD-10-CM

## 2016-03-21 DIAGNOSIS — M25511 Pain in right shoulder: Secondary | ICD-10-CM

## 2016-03-21 DIAGNOSIS — R29898 Other symptoms and signs involving the musculoskeletal system: Secondary | ICD-10-CM | POA: Diagnosis not present

## 2016-03-21 DIAGNOSIS — M6281 Muscle weakness (generalized): Secondary | ICD-10-CM | POA: Diagnosis not present

## 2016-03-21 NOTE — Therapy (Signed)
[facility] [address] [place], [place], [address] Phone: [phone]   Fax:  [phone]  Physical Therapy Treatment  Patient Details  Name: Robert BertinGary E Graver MRN: [id number] Date of Birth: 04/11/1956 Referring Provider: Dr. Jones BroomJustin Chandler  Encounter Date: 03/21/2016      PT End of Session - 03/21/16 1525    Visit Number 20   Number of Visits 24   Date for PT Re-Evaluation 04/27/16   PT Start Time 1517   PT Stop Time 1615   PT Time Calculation (min) 58 min   Activity Tolerance Patient tolerated treatment well      Past Medical History:  Diagnosis Date  . Arthritis    back  . Depression   . Diabetes mellitus without complication (HCC)   . Hyperlipidemia   . Hypertension   . Renal disorder    calculi, multiple lithotripsies, stents  . Rotator cuff disorder   . Spinal pain     Past Surgical History:  Procedure Laterality Date  . ANTERIOR FUSION CERVICAL SPINE    . FOOT SURGERY    . HERNIA REPAIR    . LITHOTRIPSY    . SHOULDER ARTHROSCOPY WITH ROTATOR CUFF REPAIR AND SUBACROMIAL DECOMPRESSION Right 12/28/2015   Procedure: SHOULDER ARTHROSCOPY TAKE DOWN ROTATOR CUFF REPAIR AND SUBACROMIAL DECOMPRESSION, DEBRIDEMENT OF SLAP TEAR;  Surgeon: Jones BroomJustin Chandler, MD;  Location: [facility];  Service: Orthopedics;  Laterality: Right;  SHOULDER ARTHROSCOPY TAKE DOWN ROTATOR CUFF REPAIR AND SUBACROMIAL DECOMPRESSION  . VASECTOMY Bilateral     There were no vitals filed for this visit.      Subjective Assessment - 03/21/16 1526    Subjective Pt reports he had a lot of relief with last session (joint mobs). He thought he would be sore, but it felt good.  Still having trouble sleeping at night, but notices having pillow under arm (on side) helps.    Pertinent History HTN; cervical fusion ~ 7 yrs ago; HNP cervical spine no surgery ~5 yrs ago    Patient Stated Goals use shoulder again without pain   Currently in Pain? No/denies   Pain Score --  2/10 with stretching arm    Pain Location Shoulder   Pain Orientation Right            [facility] PT Assessment - 03/21/16 0001      Assessment   Medical Diagnosis Rt RCR; DCR; SAD    Referring Provider Dr. Jones BroomJustin Chandler   Onset Date/Surgical Date 12/28/15   Hand Dominance Right   Next MD Visit 04/20/16     AROM   Right Shoulder Flexion 152 Degrees  supine    Right Shoulder External Rotation 75 Degrees  supine, shoulder abdct to ~70          [facility] Adult PT Treatment/Exercise - 03/21/16 0001      Shoulder Exercises: Prone   Flexion Right;10 reps   Extension Right;12 reps  with scap retraction, 1# in hand   Horizontal ABduction 1 Right;10 reps;Weights  palm down.    Other Prone Exercises Prone Rt row x 10 reps, with scap squeeze (2# in hand)     Shoulder Exercises: Pulleys   Flexion --  10 sec x 15   ABduction --  10 sec x 15   Other Pulley Exercises IR behind back x 20 sec x 2 reps     Shoulder Exercises: Stretch   Corner Stretch 30 seconds;3 reps   Other Shoulder Stretches supine  hands behind head x 60sec x 2 reps     Cryotherapy   Number Minutes Cryotherapy 15 Minutes   Cryotherapy Location Shoulder  Rt   Type of Cryotherapy Ice pack     Electrical Stimulation   Electrical Stimulation Location Rt shoulder   Electrical Stimulation Action IFC   Electrical Stimulation Parameters to tolerance    Electrical Stimulation Goals Tone;Pain     Manual Therapy   Soft tissue mobilization pecs/trap/levator   Scapular Mobilization Rt   Passive ROM Rt shoulder flex/ER/horizontal abduction/extension                   PT Short Term Goals - 02/24/16 1315      PT SHORT TERM GOAL #1   Title Improve posture and alignment with patient engaging posterior shoulder girdle musculature 02/09/16   Time 6   Period Weeks   Status Achieved     PT SHORT TERM GOAL #2   Title Initiate AAROM/AROM exercises, progressing per  protocol 02/09/16   Time 6   Period Weeks   Status Achieved     PT SHORT TERM GOAL #3   Title Education re posture; alignment; progressive exercise program; rehab 02/09/16   Time 6   Period Weeks   Status Achieved           PT Long Term Goals - 03/16/16 1438      PT LONG TERM GOAL #1   Title Improve posture and alignment with patient demonstrating posterior shoulder girdle control for UE function 04/27/16   Time 18   Period Weeks   Status Revised     PT LONG TERM GOAL #2   Title Improve Rt shoulder ROM to equal or greater than Lt shoulder ROM 04/27/16   Time 18   Period Weeks   Status Revised     PT LONG TERM GOAL #3   Title Rt shoulder strength =/> 5-/5 without pain 04/27/16   Time 18   Period Weeks   Status Revised     PT LONG TERM GOAL #4   Title Independent in advanced HEP 04/27/16   Time 18   Period Weeks   Status Revised     PT LONG TERM GOAL #5   Title improve FOTO =/< 39% impaired 04/27/16   Time 18   Period Weeks   Status Revised               Plan - 03/21/16 1608    Clinical Impression Statement Pt demonstrated improved Rt shoulder mobility. Continued focus on joint mob/ PROM to increase mobility.   Pt tolerated treatment well, with minimal increase in pain.  Pt continues to make gradual progress toward unmet goals.    Rehab Potential Good   PT Frequency 2x / week   PT Treatment/Interventions Patient/family education;ADLs/Self Care Home Management;Cryotherapy;Electrical Stimulation;Iontophoresis 4mg /ml Dexamethasone;Moist Heat;Traction;Ultrasound;Manual techniques;Dry needling;Therapeutic activities;Therapeutic exercise   PT Next Visit Plan Continue progressive ROM and strengthening for Rt shoulder. - continue focus on ROM with joint mobs and manual therapy.    Consulted and Agree with Plan of Care Patient      Patient will benefit from skilled therapeutic intervention in order to improve the following deficits and impairments:  Postural  dysfunction, Improper body mechanics, Pain, Impaired UE functional use, Decreased range of motion, Decreased mobility, Decreased strength, Decreased activity tolerance  Visit Diagnosis: Acute pain of right shoulder  Muscle weakness (generalized)  Abnormal posture  Weakness of shoulder     Problem List Patient  Active Problem List   Diagnosis Date Noted  . Skin rash 02/23/2016  . Ingrown right big toenail 02/16/2016  . Obesity 01/23/2015  . Right shoulder pain 01/23/2015  . Former smoker 08/28/2014  . Nephrolithiasis 07/25/2014  . Diabetes mellitus, type 2 (HCC) 07/25/2014  . Essential hypertension, benign 07/25/2014  . Annual physical exam 07/25/2014  . Hyperlipidemia 07/25/2014   Mayer Camel, PTA 03/21/16 4:16 PM  [facility] [place] [address] [place], [place], [address] Phone: [phone]   Fax:  [phone]  Name: PAULMICHAEL SCHRECK MRN: [id number] Date of Birth: 1955-11-13

## 2016-03-24 ENCOUNTER — Ambulatory Visit (INDEPENDENT_AMBULATORY_CARE_PROVIDER_SITE_OTHER): Payer: Managed Care, Other (non HMO) | Admitting: Physical Therapy

## 2016-03-24 DIAGNOSIS — R293 Abnormal posture: Secondary | ICD-10-CM

## 2016-03-24 DIAGNOSIS — M6281 Muscle weakness (generalized): Secondary | ICD-10-CM

## 2016-03-24 DIAGNOSIS — M25511 Pain in right shoulder: Secondary | ICD-10-CM | POA: Diagnosis not present

## 2016-03-24 DIAGNOSIS — R29898 Other symptoms and signs involving the musculoskeletal system: Secondary | ICD-10-CM

## 2016-03-24 NOTE — Therapy (Signed)
[facility] [place] [address] [place], [place], [address] Phone: [phone]   Fax:  [phone]  Physical Therapy Treatment  Patient Details  Name: Robert Hines MRN: [id number] Date of Birth: September 01, 1955 Referring Provider: Dr. Jones Broom   Encounter Date: 03/24/2016      PT End of Session - 03/24/16 1627    Visit Number 21   Number of Visits 24   Date for PT Re-Evaluation 04/27/16   PT Start Time 1618   PT Stop Time 1717   PT Time Calculation (min) 59 min   Activity Tolerance Patient tolerated treatment well      Past Medical History:  Diagnosis Date  . Arthritis    back  . Depression   . Diabetes mellitus without complication (HCC)   . Hyperlipidemia   . Hypertension   . Renal disorder    calculi, multiple lithotripsies, stents  . Rotator cuff disorder   . Spinal pain     Past Surgical History:  Procedure Laterality Date  . ANTERIOR FUSION CERVICAL SPINE    . FOOT SURGERY    . HERNIA REPAIR    . LITHOTRIPSY    . SHOULDER ARTHROSCOPY WITH ROTATOR CUFF REPAIR AND SUBACROMIAL DECOMPRESSION Right 12/28/2015   Procedure: SHOULDER ARTHROSCOPY TAKE DOWN ROTATOR CUFF REPAIR AND SUBACROMIAL DECOMPRESSION, DEBRIDEMENT OF SLAP TEAR;  Surgeon: Jones Broom, MD;  Location: [facility];  Service: Orthopedics;  Laterality: Right;  SHOULDER ARTHROSCOPY TAKE DOWN ROTATOR CUFF REPAIR AND SUBACROMIAL DECOMPRESSION  . VASECTOMY Bilateral     There were no vitals filed for this visit.      Subjective Assessment - 03/24/16 1622    Subjective Pt reports he is doing more at work, although he is trying to be cautious as he doesn't want to injure himself.  His biggest complaint is that he is not sleeping well due to pain in the middle of night, he wakes when he rolls onto Rt shoulder.  Pain up to 4/10.  He continues to perform HEP and ice 1-2x/day.    Currently in Pain? No/denies            [facility] PT  Assessment - 03/24/16 0001      Assessment   Medical Diagnosis Rt RCR; DCR; SAD    Referring Provider Dr. Jones Broom    Onset Date/Surgical Date 12/28/15   Hand Dominance Right   Next MD Visit 04/20/16     AROM   Right/Left Shoulder Right   Right Shoulder Extension 55 Degrees   Right Shoulder Flexion --  140 standing, 152 supine   Right Shoulder External Rotation 72 Degrees  supine, shoulder abdct to ~70          [facility] Adult PT Treatment/Exercise - 03/24/16 0001      Shoulder Exercises: Standing   External Rotation Strengthening;Right;10 reps;Theraband   Theraband Level (Shoulder External Rotation) Level 1 (Yellow)  then isometric with red band x 8 reps   Internal Rotation Strengthening;Right;10 reps;Theraband   Theraband Level (Shoulder Internal Rotation) Level 1 (Yellow)   Extension Strengthening;Right;20 reps   Theraband Level (Shoulder Extension) Level 2 (Red)     Shoulder Exercises: Pulleys   Other Pulley Exercises IR behind back x 20 sec x 2 reps     Shoulder Exercises: ROM/Strengthening   UBE (Upper Arm Bike) L2: 2 min each direction      Shoulder Exercises: Stretch   Corner Stretch 30 seconds;5 reps   Internal Rotation Stretch 30  seconds   Other Shoulder Stretches flexion wall stretch x 30 sec x 2 reps   Other Shoulder Stretches supine hands behind head x 60sec x 2 reps     Moist Heat Therapy   Number Minutes Moist Heat 15 Minutes   Moist Heat Location Shoulder     Electrical Stimulation   Electrical Stimulation Location Rt shoulder   Electrical Stimulation Action IFC   Electrical Stimulation Parameters to tolerance    Electrical Stimulation Goals Tone;Pain     Manual Therapy   Soft tissue mobilization Edge tool assistance to Rt shoulder (pec, deltoid, posterior shoulder girdle) to decrease fascial restrictions, pain, and improve ROM     Myofascial Release Rt pec release                   PT Short Term Goals - 02/24/16 1315      PT  SHORT TERM GOAL #1   Title Improve posture and alignment with patient engaging posterior shoulder girdle musculature 02/09/16   Time 6   Period Weeks   Status Achieved     PT SHORT TERM GOAL #2   Title Initiate AAROM/AROM exercises, progressing per protocol 02/09/16   Time 6   Period Weeks   Status Achieved     PT SHORT TERM GOAL #3   Title Education re posture; alignment; progressive exercise program; rehab 02/09/16   Time 6   Period Weeks   Status Achieved           PT Long Term Goals - 03/16/16 1438      PT LONG TERM GOAL #1   Title Improve posture and alignment with patient demonstrating posterior shoulder girdle control for UE function 04/27/16   Time 18   Period Weeks   Status Revised     PT LONG TERM GOAL #2   Title Improve Rt shoulder ROM to equal or greater than Lt shoulder ROM 04/27/16   Time 18   Period Weeks   Status Revised     PT LONG TERM GOAL #3   Title Rt shoulder strength =/> 5-/5 without pain 04/27/16   Time 18   Period Weeks   Status Revised     PT LONG TERM GOAL #4   Title Independent in advanced HEP 04/27/16   Time 18   Period Weeks   Status Revised     PT LONG TERM GOAL #5   Title improve FOTO =/< 39% impaired 04/27/16   Time 18   Period Weeks   Status Revised               Plan - 03/24/16 1706    Clinical Impression Statement Pt tolerated all exercises with some mild increase in pain with stretches into Rt shoulder flexion and external rotation.  Pt is making gradual progress towards remaining goals.     Rehab Potential Good   PT Frequency 2x / week   PT Treatment/Interventions Patient/family education;ADLs/Self Care Home Management;Cryotherapy;Electrical Stimulation;Iontophoresis 4mg /ml Dexamethasone;Moist Heat;Traction;Ultrasound;Manual techniques;Dry needling;Therapeutic activities;Therapeutic exercise   PT Next Visit Plan Continue progressive ROM and strengthening for Rt shoulder. - continue focus on ROM with joint mobs and  manual therapy.  Assess response to Edge tool / MHP with estim.    Consulted and Agree with Plan of Care Patient      Patient will benefit from skilled therapeutic intervention in order to improve the following deficits and impairments:  Postural dysfunction, Improper body mechanics, Pain, Impaired UE functional use, Decreased range of motion,  Decreased mobility, Decreased strength, Decreased activity tolerance  Visit Diagnosis: Acute pain of right shoulder  Muscle weakness (generalized)  Abnormal posture  Weakness of shoulder     Problem List Patient Active Problem List   Diagnosis Date Noted  . Skin rash 02/23/2016  . Ingrown right big toenail 02/16/2016  . Obesity 01/23/2015  . Right shoulder pain 01/23/2015  . Former smoker 08/28/2014  . Nephrolithiasis 07/25/2014  . Diabetes mellitus, type 2 (HCC) 07/25/2014  . Essential hypertension, benign 07/25/2014  . Annual physical exam 07/25/2014  . Hyperlipidemia 07/25/2014   Mayer CamelJennifer Carlson-Long, PTA 03/24/16 6:21 PM  [facility] [address] [place], [place], [address] Phone: [phone]   Fax:  [phone]  Name: Robert BertinGary E Hines MRN: [id number] Date of Birth: [birth year]

## 2016-03-27 ENCOUNTER — Other Ambulatory Visit: Payer: Self-pay | Admitting: Sports Medicine

## 2016-03-28 ENCOUNTER — Ambulatory Visit (INDEPENDENT_AMBULATORY_CARE_PROVIDER_SITE_OTHER): Payer: Managed Care, Other (non HMO) | Admitting: Rehabilitative and Restorative Service Providers"

## 2016-03-28 ENCOUNTER — Encounter: Payer: Self-pay | Admitting: Rehabilitative and Restorative Service Providers"

## 2016-03-28 DIAGNOSIS — R29898 Other symptoms and signs involving the musculoskeletal system: Secondary | ICD-10-CM

## 2016-03-28 DIAGNOSIS — M6281 Muscle weakness (generalized): Secondary | ICD-10-CM

## 2016-03-28 DIAGNOSIS — R293 Abnormal posture: Secondary | ICD-10-CM | POA: Diagnosis not present

## 2016-03-28 DIAGNOSIS — M25511 Pain in right shoulder: Secondary | ICD-10-CM

## 2016-03-28 NOTE — Therapy (Addendum)
[facility] [place] [address] [place], [place], [address] Phone: [phone]   Fax:  [phone]  Physical Therapy Treatment  Patient Details  Name: Robert Hines MRN: [id number] Date of Birth: 20-Jan-1956 Referring Provider: Dr. Jones Broom   Encounter Date: 03/28/2016      PT End of Session - 03/28/16 1523    Visit Number 22   Number of Visits 36   Date for PT Re-Evaluation 04/27/16   PT Start Time 1515   PT Stop Time 1610   PT Time Calculation (min) 55 min   Activity Tolerance Patient tolerated treatment well      Past Medical History:  Diagnosis Date  . Arthritis    back  . Depression   . Diabetes mellitus without complication (HCC)   . Hyperlipidemia   . Hypertension   . Renal disorder    calculi, multiple lithotripsies, stents  . Rotator cuff disorder   . Spinal pain     Past Surgical History:  Procedure Laterality Date  . ANTERIOR FUSION CERVICAL SPINE    . FOOT SURGERY    . HERNIA REPAIR    . LITHOTRIPSY    . SHOULDER ARTHROSCOPY WITH ROTATOR CUFF REPAIR AND SUBACROMIAL DECOMPRESSION Right 12/28/2015   Procedure: SHOULDER ARTHROSCOPY TAKE DOWN ROTATOR CUFF REPAIR AND SUBACROMIAL DECOMPRESSION, DEBRIDEMENT OF SLAP TEAR;  Surgeon: Jones Broom, MD;  Location: [facility];  Service: Orthopedics;  Laterality: Right;  SHOULDER ARTHROSCOPY TAKE DOWN ROTATOR CUFF REPAIR AND SUBACROMIAL DECOMPRESSION  . VASECTOMY Bilateral     There were no vitals filed for this visit.      Subjective Assessment - 03/28/16 1524    Subjective Continued pain in the shoulder especially with IR. Pain still awakens him at night. Has been taking a pain pill at night and it helps him sleep so much better. Gradually progressing. Doing well at work.    Currently in Pain? No/denies                         [facility] Adult PT Treatment/Exercise - 03/28/16 0001      Shoulder Exercises: Pulleys   Flexion --  10 sec hold x 10   ABduction --  10 sec hold x 10   Other Pulley Exercises IR behind back x 20 sec x 2 reps     Shoulder Exercises: ROM/Strengthening   UBE (Upper Arm Bike) L2: 2 min each direction      Shoulder Exercises: Lawyer --  doorway 3 positions 3 reps 30 sec    Internal Rotation Stretch 30 seconds  3-5 reps assist with Lt UE in standing    Other Shoulder Stretches sleeper stretch 20-30 sec hold x 3 reps Rt sidelying - arm supported on noodle - on the Rt side fully      Cryotherapy   Number Minutes Cryotherapy 15 Minutes   Cryotherapy Location Shoulder  Rt   Type of Cryotherapy Ice pack     Electrical Stimulation   Electrical Stimulation Location Rt shoulder   Electrical Stimulation Action IFC   Electrical Stimulation Parameters to tolerance   Electrical Stimulation Goals Tone;Pain     Manual Therapy   Manual therapy comments pt supine and Lt sidelying    Joint Mobilization Rt GH joint - Grade II/III inferior and posterior/anterior; thoracic spine mobilization CPA and Rt UPA Grade II/III pt in sitting    Soft tissue mobilization Rt pecs/upper trap/leveator/deltoid/teres  musculature    Myofascial Release Rt pecs/ upper trap    Scapular Mobilization Rt pt Lt sidelying (passive stretch into IR hand on hip in sidelying)   Passive ROM Rt shoulder flex/ER/IR/horizontal abduction/extension                 PT Education - 03/28/16 1540    Education provided Yes   Education Details sleeper stretch    Person(s) Educated Patient   Methods Explanation;Demonstration;Tactile cues;Verbal cues;Handout   Comprehension Verbalized understanding;Returned demonstration;Verbal cues required;Tactile cues required          PT Short Term Goals - 02/24/16 1315      PT SHORT TERM GOAL #1   Title Improve posture and alignment with patient engaging posterior shoulder girdle musculature 02/09/16   Time 6   Period Weeks   Status Achieved     PT  SHORT TERM GOAL #2   Title Initiate AAROM/AROM exercises, progressing per protocol 02/09/16   Time 6   Period Weeks   Status Achieved     PT SHORT TERM GOAL #3   Title Education re posture; alignment; progressive exercise program; rehab 02/09/16   Time 6   Period Weeks   Status Achieved           PT Long Term Goals - 03/28/16 1728      PT LONG TERM GOAL #1   Title Improve posture and alignment with patient demonstrating posterior shoulder girdle control for UE function 04/27/16   Time 18   Period Weeks   Status On-going     PT LONG TERM GOAL #2   Title Improve Rt shoulder ROM to equal or greater than Lt shoulder ROM 04/27/16   Time 18   Period Weeks   Status On-going     PT LONG TERM GOAL #3   Title Rt shoulder strength =/> 5-/5 without pain 04/27/16   Time 18   Period Weeks   Status On-going     PT LONG TERM GOAL #4   Title Independent in advanced HEP 04/27/16   Time 18   Period Weeks   Status On-going     PT LONG TERM GOAL #5   Title improve FOTO =/< 39% impaired 04/27/16   Time 18   Period Weeks   Status On-going               Plan - 03/28/16 1724    Clinical Impression Statement Improving tissue and soint mobility with gains in ER and IR noted. Patient tolerates joint mobilization and PROM well. Added sleeper stretch and Lt sidelying stretches for home to address tightness in IR. Patient is controlling pain at night with use of pain meds. progressing well toward stated goalss of therapy. Continues to present with signs and symptoms consistent with adhesive capsulitis - symptoms resolving gradually. Needs focus on ROM; joint mobs; stretching.    Rehab Potential Good   PT Frequency 2x / week   PT Duration Other (comment)   PT Treatment/Interventions Patient/family education;ADLs/Self Care Home Management;Cryotherapy;Electrical Stimulation;Iontophoresis 4mg /ml Dexamethasone;Moist Heat;Traction;Ultrasound;Manual techniques;Dry needling;Therapeutic  activities;Therapeutic exercise   PT Next Visit Plan Continue progressive ROM and strengthening for Rt shoulder. - continue focus on ROM with joint mobs and manual therapy.  To add sleeper stretch for home.    Consulted and Agree with Plan of Care Patient      Patient will benefit from skilled therapeutic intervention in order to improve the following deficits and impairments:  Postural dysfunction, Improper body mechanics, Pain, Impaired  UE functional use, Decreased range of motion, Decreased mobility, Decreased strength, Decreased activity tolerance  Visit Diagnosis: Acute pain of right shoulder  Muscle weakness (generalized)  Abnormal posture  Weakness of shoulder     Problem List Patient Active Problem List   Diagnosis Date Noted  . Skin rash 02/23/2016  . Ingrown right big toenail 02/16/2016  . Obesity 01/23/2015  . Right shoulder pain 01/23/2015  . Former smoker 08/28/2014  . Nephrolithiasis 07/25/2014  . Diabetes mellitus, type 2 (HCC) 07/25/2014  . Essential hypertension, benign 07/25/2014  . Annual physical exam 07/25/2014  . Hyperlipidemia 07/25/2014    Lavender Stanke Rober Minion PT, MPH  03/28/2016, 5:34 PM  [facility] [address] [place] [address] [place], [place], [address] Phone: [phone]   Fax:  [phone]  Name: KEATYN JAWAD MRN: [id number] Date of Birth: 10-Jun-1956

## 2016-03-28 NOTE — Patient Instructions (Addendum)
Posterior Capsule Sleeper Stretch, Side-Lying    Lie on side, pillow under head, neck in neutral, underside arm in 90-90 of shoulder and elbow flexion with scapula fixed to table. Use other hand to press back of underside arm forward and downward. Keep elbow angle. Hold __20-30_ seconds.  Repeat __3_ times per session. Do _2__ sessions per day.

## 2016-03-31 ENCOUNTER — Ambulatory Visit (INDEPENDENT_AMBULATORY_CARE_PROVIDER_SITE_OTHER): Payer: Managed Care, Other (non HMO) | Admitting: Physical Therapy

## 2016-03-31 DIAGNOSIS — R293 Abnormal posture: Secondary | ICD-10-CM

## 2016-03-31 DIAGNOSIS — M25511 Pain in right shoulder: Secondary | ICD-10-CM | POA: Diagnosis not present

## 2016-03-31 DIAGNOSIS — M6281 Muscle weakness (generalized): Secondary | ICD-10-CM | POA: Diagnosis not present

## 2016-03-31 NOTE — Therapy (Signed)
[facility] Outpatient Rehabilitation [place] [address] [place], [place], [address] Phone: [phone]   Fax:  [phone]  Physical Therapy Treatment  Patient Details  Name: Robert Hines MRN: [id number] Date of Birth: [birth year] Referring Provider: Dr. Jones Broom   Encounter Date: 03/31/2016      PT End of Session - 03/31/16 1536    Visit Number 23   Number of Visits 36   Date for PT Re-Evaluation 04/27/16   PT Start Time 1532   PT Stop Time 1628   PT Time Calculation (min) 56 min   Activity Tolerance Patient tolerated treatment well      Past Medical History:  Diagnosis Date  . Arthritis    back  . Depression   . Diabetes mellitus without complication (HCC)   . Hyperlipidemia   . Hypertension   . Renal disorder    calculi, multiple lithotripsies, stents  . Rotator cuff disorder   . Spinal pain     Past Surgical History:  Procedure Laterality Date  . ANTERIOR FUSION CERVICAL SPINE    . FOOT SURGERY    . HERNIA REPAIR    . LITHOTRIPSY    . SHOULDER ARTHROSCOPY WITH ROTATOR CUFF REPAIR AND SUBACROMIAL DECOMPRESSION Right 12/28/2015   Procedure: SHOULDER ARTHROSCOPY TAKE DOWN ROTATOR CUFF REPAIR AND SUBACROMIAL DECOMPRESSION, DEBRIDEMENT OF SLAP TEAR;  Surgeon: Jones Broom, MD;  Location: [facility];  Service: Orthopedics;  Laterality: Right;  SHOULDER ARTHROSCOPY TAKE DOWN ROTATOR CUFF REPAIR AND SUBACROMIAL DECOMPRESSION  . VASECTOMY Bilateral     There were no vitals filed for this visit.      Subjective Assessment - 03/31/16 1537    Subjective Pt reports his pain was 5/10 after leaving last session and pain carried into the following day.  He took oxycodone to help with pain.  He feels his arm is moving a little better.    Currently in Pain? No/denies   Pain Score 0-No pain            [facility] PT Assessment - 03/31/16 0001      Assessment   Medical Diagnosis Rt RCR; DCR; SAD    Referring  Provider Dr. Jones Broom    Onset Date/Surgical Date 12/28/15   Hand Dominance Right   Next MD Visit 04/20/16     AROM   Right Shoulder Flexion 153 Degrees  in supine,    Right Shoulder External Rotation    Right shoulder internal rotation  55 Degrees , 70 after manual  supine, shoulder abdct ~70 deg   Behind back to~T9-10           [facility] Adult PT Treatment/Exercise - 03/31/16 0001      Shoulder Exercises: Supine   External Rotation PROM;Right;5 reps;Weights  2# in hand stretching   Flexion AAROM;Right;10 reps;Weights  2#, stretch at end range     Shoulder Exercises: Pulleys   Flexion --  4 min   ABduction 3 minutes     Shoulder Exercises: Stretch   Corner Stretch 3 reps;30 seconds   Internal Rotation Stretch 30 seconds  3-5 reps assist with Lt UE in standing    Other Shoulder Stretches sleeper stretch 20-30 sec hold x 3 reps Rt sidelying - arm supported on noodle - on the Rt side fully      Cryotherapy   Number Minutes Cryotherapy 15 Minutes   Cryotherapy Location Shoulder  Rt   Type of Cryotherapy Ice pack     Electrical Stimulation  Electrical Stimulation Location Rt shoulder   Electrical Stimulation Action IFC   Electrical Stimulation Parameters to tolerance    Electrical Stimulation Goals Tone;Pain     Manual Therapy   Joint Mobilization Rt GH joint - Grade II/III inferior and posterior/anterior   Myofascial Release Rt pecs/ upper trap / subscap    Scapular Mobilization Rt sidelying (passive stretch into IR)   Passive ROM Rt shoulder flex/ER/IR                  PT Short Term Goals - 02/24/16 1315      PT SHORT TERM GOAL #1   Title Improve posture and alignment with patient engaging posterior shoulder girdle musculature 02/09/16   Time 6   Period Weeks   Status Achieved     PT SHORT TERM GOAL #2   Title Initiate AAROM/AROM exercises, progressing per protocol 02/09/16   Time 6   Period Weeks   Status Achieved     PT SHORT TERM  GOAL #3   Title Education re posture; alignment; progressive exercise program; rehab 02/09/16   Time 6   Period Weeks   Status Achieved           PT Long Term Goals - 03/28/16 1728      PT LONG TERM GOAL #1   Title Improve posture and alignment with patient demonstrating posterior shoulder girdle control for UE function 04/27/16   Time 18   Period Weeks   Status On-going     PT LONG TERM GOAL #2   Title Improve Rt shoulder ROM to equal or greater than Lt shoulder ROM 04/27/16   Time 18   Period Weeks   Status On-going     PT LONG TERM GOAL #3   Title Rt shoulder strength =/> 5-/5 without pain 04/27/16   Time 18   Period Weeks   Status On-going     PT LONG TERM GOAL #4   Title Independent in advanced HEP 04/27/16   Time 18   Period Weeks   Status On-going     PT LONG TERM GOAL #5   Title improve FOTO =/< 39% impaired 04/27/16   Time 18   Period Weeks   Status On-going               Plan - 03/31/16 1622    Rehab Potential Good   PT Frequency 2x / week   PT Treatment/Interventions Patient/family education;ADLs/Self Care Home Management;Cryotherapy;Electrical Stimulation;Iontophoresis 4mg /ml Dexamethasone;Moist Heat;Traction;Ultrasound;Manual techniques;Dry needling;Therapeutic activities;Therapeutic exercise   PT Next Visit Plan Continue progressive ROM and strengthening for Rt shoulder. - continue focus on ROM with joint mobs and manual therapy.    Consulted and Agree with Plan of Care Patient      Patient will benefit from skilled therapeutic intervention in order to improve the following deficits and impairments:  Postural dysfunction, Improper body mechanics, Pain, Impaired UE functional use, Decreased range of motion, Decreased mobility, Decreased strength, Decreased activity tolerance  Visit Diagnosis: Acute pain of right shoulder  Muscle weakness (generalized)  Abnormal posture     Problem List Patient Active Problem List   Diagnosis Date Noted   . Skin rash 02/23/2016  . Ingrown right big toenail 02/16/2016  . Obesity 01/23/2015  . Right shoulder pain 01/23/2015  . Former smoker 08/28/2014  . Nephrolithiasis 07/25/2014  . Diabetes mellitus, type 2 (HCC) 07/25/2014  . Essential hypertension, benign 07/25/2014  . Annual physical exam 07/25/2014  . Hyperlipidemia 07/25/2014   Mayer CamelJennifer Carlson-Long,  PTA 03/31/16 4:24 PM  [facility] Outpatient Rehabilitation [place] [address] [place], [place], [address] Phone: [phone]   Fax:  [phone]  Name: DEMTRIUS ROUNDS MRN: [id number] Date of Birth: 01-17-1956

## 2016-04-06 ENCOUNTER — Ambulatory Visit (INDEPENDENT_AMBULATORY_CARE_PROVIDER_SITE_OTHER): Payer: Managed Care, Other (non HMO) | Admitting: Physical Therapy

## 2016-04-06 DIAGNOSIS — R293 Abnormal posture: Secondary | ICD-10-CM

## 2016-04-06 DIAGNOSIS — M25511 Pain in right shoulder: Secondary | ICD-10-CM | POA: Diagnosis not present

## 2016-04-06 DIAGNOSIS — R29898 Other symptoms and signs involving the musculoskeletal system: Secondary | ICD-10-CM

## 2016-04-06 NOTE — Therapy (Signed)
[facility] Outpatient Rehabilitation [place] [address] [place] [address] [place], [place], [address] Phone: [phone]   Fax:  [phone]  Physical Therapy Treatment  Patient Details  Name: Robert Hines MRN: [id number] Date of Birth: 23-Jan-1956 Referring Provider: Dr. Jones Broom   Encounter Date: 04/06/2016      PT End of Session - 04/06/16 1537    Visit Number 24   Number of Visits 36   Date for PT Re-Evaluation 04/27/16   PT Start Time 1518   PT Stop Time 1613   PT Time Calculation (min) 55 min   Activity Tolerance Patient tolerated treatment well      Past Medical History:  Diagnosis Date  . Arthritis    back  . Depression   . Diabetes mellitus without complication (HCC)   . Hyperlipidemia   . Hypertension   . Renal disorder    calculi, multiple lithotripsies, stents  . Rotator cuff disorder   . Spinal pain     Past Surgical History:  Procedure Laterality Date  . ANTERIOR FUSION CERVICAL SPINE    . FOOT SURGERY    . HERNIA REPAIR    . LITHOTRIPSY    . SHOULDER ARTHROSCOPY WITH ROTATOR CUFF REPAIR AND SUBACROMIAL DECOMPRESSION Right 12/28/2015   Procedure: SHOULDER ARTHROSCOPY TAKE DOWN ROTATOR CUFF REPAIR AND SUBACROMIAL DECOMPRESSION, DEBRIDEMENT OF SLAP TEAR;  Surgeon: Jones Broom, MD;  Location: [facility];  Service: Orthopedics;  Laterality: Right;  SHOULDER ARTHROSCOPY TAKE DOWN ROTATOR CUFF REPAIR AND SUBACROMIAL DECOMPRESSION  . VASECTOMY Bilateral     There were no vitals filed for this visit.      Subjective Assessment - 04/06/16 1545    Subjective Pt reports he was getting discouraged with how things are going when he became very stiff over weekend, when weather changed.  Wasn't able to hand behind back at all.  He has been using heat and his pulleys to loosen Rt shoulder up.     Patient Stated Goals use shoulder again without pain   Currently in Pain? No/denies            [facility] PT Assessment -  04/06/16 0001      Assessment   Medical Diagnosis Rt RCR; DCR; SAD    Referring Provider Dr. Jones Broom    Onset Date/Surgical Date 12/28/15   Hand Dominance Right   Next MD Visit 04/20/16     AROM   Right Shoulder Flexion 156 Degrees  supine     PROM   Right Shoulder External Rotation 85 Degrees  supine, abdct to 90 deg           [facility] Adult PT Treatment/Exercise - 04/06/16 0001      Shoulder Exercises: Standing   Other Standing Exercises Wall ladder with RUE in flexion x 10 (up to top of ladder)      Shoulder Exercises: Stretch   Corner Stretch 30 seconds  6 reps      Moist Heat Therapy   Number Minutes Moist Heat 15 Minutes   Moist Heat Location Shoulder     Electrical Stimulation   Electrical Stimulation Location Rt shoulder   Electrical Stimulation Action IFC   Electrical Stimulation Parameters to tolerance    Electrical Stimulation Goals Tone;Pain     Manual Therapy   Joint Mobilization Rt GH joint - Grade II/III inferior and posterior/anterior   Myofascial Release Rt pecs/ upper trap / subscap    Scapular Mobilization Rt pt Lt sidelying (passive stretch into  IR hand on hip in sidelying, followed by contract/relax).  In all directions in sidelying.     Passive ROM Rt shoulder flex/ER/IR/Abdct./ extension             PT Short Term Goals - 02/24/16 1315      PT SHORT TERM GOAL #1   Title Improve posture and alignment with patient engaging posterior shoulder girdle musculature 02/09/16   Time 6   Period Weeks   Status Achieved     PT SHORT TERM GOAL #2   Title Initiate AAROM/AROM exercises, progressing per protocol 02/09/16   Time 6   Period Weeks   Status Achieved     PT SHORT TERM GOAL #3   Title Education re posture; alignment; progressive exercise program; rehab 02/09/16   Time 6   Period Weeks   Status Achieved           PT Long Term Goals - 03/28/16 1728      PT LONG TERM GOAL #1   Title Improve posture and alignment with  patient demonstrating posterior shoulder girdle control for UE function 04/27/16   Time 18   Period Weeks   Status On-going     PT LONG TERM GOAL #2   Title Improve Rt shoulder ROM to equal or greater than Lt shoulder ROM 04/27/16   Time 18   Period Weeks   Status On-going     PT LONG TERM GOAL #3   Title Rt shoulder strength =/> 5-/5 without pain 04/27/16   Time 18   Period Weeks   Status On-going     PT LONG TERM GOAL #4   Title Independent in advanced HEP 04/27/16   Time 18   Period Weeks   Status On-going     PT LONG TERM GOAL #5   Title improve FOTO =/< 39% impaired 04/27/16   Time 18   Period Weeks   Status On-going               Plan - 04/06/16 1601    Clinical Impression Statement Pt demonstrated improved Rt shoulder ROM after manual therapy.  Pt had some pain with PROM; reduced with use of MHP and estim at end of session.  Progressing towards remaining goals.    Rehab Potential Good   PT Frequency 2x / week   PT Treatment/Interventions Patient/family education;ADLs/Self Care Home Management;Cryotherapy;Electrical Stimulation;Iontophoresis 4mg /ml Dexamethasone;Moist Heat;Traction;Ultrasound;Manual techniques;Dry needling;Therapeutic activities;Therapeutic exercise   PT Next Visit Plan Continue progressive ROM and strengthening for Rt shoulder. - continue focus on ROM with joint mobs and manual therapy.    Consulted and Agree with Plan of Care Patient      Patient will benefit from skilled therapeutic intervention in order to improve the following deficits and impairments:  Postural dysfunction, Improper body mechanics, Pain, Impaired UE functional use, Decreased range of motion, Decreased mobility, Decreased strength, Decreased activity tolerance  Visit Diagnosis: Acute pain of right shoulder  Abnormal posture  Weakness of shoulder     Problem List Patient Active Problem List   Diagnosis Date Noted  . Skin rash 02/23/2016  . Ingrown right big toenail  02/16/2016  . Obesity 01/23/2015  . Right shoulder pain 01/23/2015  . Former smoker 08/28/2014  . Nephrolithiasis 07/25/2014  . Diabetes mellitus, type 2 (HCC) 07/25/2014  . Essential hypertension, benign 07/25/2014  . Annual physical exam 07/25/2014  . Hyperlipidemia 07/25/2014   Mayer CamelJennifer Carlson-Long, PTA 04/06/16 4:03 PM  [facility] [address] [place], [place], [address] Phone: [phone]   Fax:  [phone]  Name: Robert Hines MRN: [id number] Date of Birth: 1955/11/20

## 2016-04-11 ENCOUNTER — Ambulatory Visit (INDEPENDENT_AMBULATORY_CARE_PROVIDER_SITE_OTHER): Payer: Managed Care, Other (non HMO) | Admitting: Rehabilitative and Restorative Service Providers"

## 2016-04-11 ENCOUNTER — Encounter: Payer: Self-pay | Admitting: Rehabilitative and Restorative Service Providers"

## 2016-04-11 DIAGNOSIS — M25511 Pain in right shoulder: Secondary | ICD-10-CM | POA: Diagnosis not present

## 2016-04-11 DIAGNOSIS — R29898 Other symptoms and signs involving the musculoskeletal system: Secondary | ICD-10-CM

## 2016-04-11 DIAGNOSIS — M6281 Muscle weakness (generalized): Secondary | ICD-10-CM | POA: Diagnosis not present

## 2016-04-11 DIAGNOSIS — R293 Abnormal posture: Secondary | ICD-10-CM | POA: Diagnosis not present

## 2016-04-11 NOTE — Therapy (Signed)
[facility] [place] [address] [place], [place], [address] Phone: [phone]   Fax:  [phone]  Physical Therapy Treatment  Patient Details  Name: Robert Hines MRN: [id number] Date of Birth: 05-29-1956 Referring Provider: Dr. Jones Broom   Encounter Date: 04/11/2016      PT End of Session - 04/11/16 1559    Visit Number 25   Number of Visits 36   Date for PT Re-Evaluation 04/27/16   PT Start Time 1600   PT Stop Time 1654   PT Time Calculation (min) 54 min   Activity Tolerance Patient tolerated treatment well      Past Medical History:  Diagnosis Date  . Arthritis    back  . Depression   . Diabetes mellitus without complication (HCC)   . Hyperlipidemia   . Hypertension   . Renal disorder    calculi, multiple lithotripsies, stents  . Rotator cuff disorder   . Spinal pain     Past Surgical History:  Procedure Laterality Date  . ANTERIOR FUSION CERVICAL SPINE    . FOOT SURGERY    . HERNIA REPAIR    . LITHOTRIPSY    . SHOULDER ARTHROSCOPY WITH ROTATOR CUFF REPAIR AND SUBACROMIAL DECOMPRESSION Right 12/28/2015   Procedure: SHOULDER ARTHROSCOPY TAKE DOWN ROTATOR CUFF REPAIR AND SUBACROMIAL DECOMPRESSION, DEBRIDEMENT OF SLAP TEAR;  Surgeon: Jones Broom, MD;  Location: [facility];  Service: Orthopedics;  Laterality: Right;  SHOULDER ARTHROSCOPY TAKE DOWN ROTATOR CUFF REPAIR AND SUBACROMIAL DECOMPRESSION  . VASECTOMY Bilateral     There were no vitals filed for this visit.      Subjective Assessment - 04/11/16 1600    Subjective Patient reports that he has had increased stiffness and soreness in the Rt shoulder in the past several days. Maybe partly due to weather or stretching aggressively at last visit. Took a whole pain pill last night.    Currently in Pain? Yes   Pain Score 3    Pain Location Shoulder   Pain Orientation Right   Pain Descriptors / Indicators Tightness;Aching   stiffness                          [facility] Adult PT Treatment/Exercise - 04/11/16 0001      Shoulder Exercises: Pulleys   Flexion --  10 sec x 10   ABduction --  10 sec x 10     Shoulder Exercises: Stretch   Corner Stretch 30 seconds  6 reps    Other Shoulder Stretches snow angel with noodle 3-4 min      Ultrasound   Ultrasound Location ant Rt shoulder    Ultrasound Parameters 100%; 1 mHz; 1.2 w/cm2; 8 min    Ultrasound Goals Other (Comment);Pain  muscular/joint tightness     Manual Therapy   Manual therapy comments pt supine    Joint Mobilization Rt GH joint - Grade II/III inferior and posterior/anterior   Soft tissue mobilization Rt pecs/upper trap/leveator/deltoid/teres musculature    Myofascial Release Rt pecs/ upper trap / subscap    Scapular Mobilization Rt pt Lt sidelying (passive stretch into IR hand on hip in sidelying, followed by contract/relax).  In all directions in sidelying.     Passive ROM Rt shoulder flex/ER/IR/Abdct./ extension                 PT Education - 04/11/16 1614    Education provided Yes   Education Details HEP - prolonged  snow angel stretch    Person(s) Educated Patient   Methods Explanation;Demonstration;Tactile cues;Verbal cues;Handout   Comprehension Verbalized understanding;Returned demonstration;Verbal cues required;Tactile cues required          PT Short Term Goals - 02/24/16 1315      PT SHORT TERM GOAL #1   Title Improve posture and alignment with patient engaging posterior shoulder girdle musculature 02/09/16   Time 6   Period Weeks   Status Achieved     PT SHORT TERM GOAL #2   Title Initiate AAROM/AROM exercises, progressing per protocol 02/09/16   Time 6   Period Weeks   Status Achieved     PT SHORT TERM GOAL #3   Title Education re posture; alignment; progressive exercise program; rehab 02/09/16   Time 6   Period Weeks   Status Achieved           PT Long Term Goals - 04/11/16 1604       PT LONG TERM GOAL #1   Title Improve posture and alignment with patient demonstrating posterior shoulder girdle control for UE function 04/27/16   Time 18   Period Weeks   Status On-going     PT LONG TERM GOAL #2   Title Improve Rt shoulder ROM to equal or greater than Lt shoulder ROM 04/27/16   Time 18   Period Weeks   Status On-going     PT LONG TERM GOAL #3   Title Rt shoulder strength =/> 5-/5 without pain 04/27/16   Time 18   Period Weeks   Status On-going     PT LONG TERM GOAL #4   Title Independent in advanced HEP 04/27/16   Time 18   Period Weeks   Status On-going     PT LONG TERM GOAL #5   Title improve FOTO =/< 39% impaired 04/27/16   Time 18   Period Weeks   Status On-going               Plan - 04/11/16 1605    Clinical Impression Statement Increased stiffness; soreness and pain in the past few days - combination of stretching and weather. Working on ROM and stretching with gradual progress. Focused on pain management; manual work and modalities Continued end range tightness and discomfort.    Rehab Potential Good   PT Frequency 2x / week   PT Duration Other (comment)   PT Treatment/Interventions Patient/family education;ADLs/Self Care Home Management;Cryotherapy;Electrical Stimulation;Iontophoresis 4mg /ml Dexamethasone;Moist Heat;Traction;Ultrasound;Manual techniques;Dry needling;Therapeutic activities;Therapeutic exercise   PT Next Visit Plan Continue progressive ROM and strengthening for Rt shoulder. - continue focus on ROM with joint mobs and manual therapy.    Consulted and Agree with Plan of Care Patient      Patient will benefit from skilled therapeutic intervention in order to improve the following deficits and impairments:  Postural dysfunction, Improper body mechanics, Pain, Impaired UE functional use, Decreased range of motion, Decreased mobility, Decreased strength, Decreased activity tolerance  Visit Diagnosis: Acute pain of right  shoulder  Abnormal posture  Weakness of shoulder  Muscle weakness (generalized)     Problem List Patient Active Problem List   Diagnosis Date Noted  . Skin rash 02/23/2016  . Ingrown right big toenail 02/16/2016  . Obesity 01/23/2015  . Right shoulder pain 01/23/2015  . Former smoker 08/28/2014  . Nephrolithiasis 07/25/2014  . Diabetes mellitus, type 2 (HCC) 07/25/2014  . Essential hypertension, benign 07/25/2014  . Annual physical exam 07/25/2014  . Hyperlipidemia 07/25/2014    Dominyck Reser P  Leonor Liv PT, MPH  04/11/2016, 5:02 PM  [facility] [address] [place], [place], [address] Phone: [phone]   Fax:  [phone]  Name: Robert Hines MRN: [id number] Date of Birth: September 08, 1955

## 2016-04-11 NOTE — Patient Instructions (Addendum)
SUPINE Tips A    Being in the supine position means to be lying on the back. Lying on the back is the position of least compression on the bones and discs of the spine, and helps to re-align the natural curves of the back. Bring arms out to side to comfortable position. Rest arms on floor or bed 2-3 minutes working toward 5 min  . No pain. Bend elbow to release the stretch as needed then straighten arms out again. Can lie on swim noodle along spine to help with stretch.

## 2016-04-14 ENCOUNTER — Ambulatory Visit (INDEPENDENT_AMBULATORY_CARE_PROVIDER_SITE_OTHER): Payer: Managed Care, Other (non HMO) | Admitting: Physical Therapy

## 2016-04-14 DIAGNOSIS — M6281 Muscle weakness (generalized): Secondary | ICD-10-CM

## 2016-04-14 DIAGNOSIS — M25511 Pain in right shoulder: Secondary | ICD-10-CM | POA: Diagnosis not present

## 2016-04-14 DIAGNOSIS — R29898 Other symptoms and signs involving the musculoskeletal system: Secondary | ICD-10-CM | POA: Diagnosis not present

## 2016-04-14 DIAGNOSIS — R293 Abnormal posture: Secondary | ICD-10-CM | POA: Diagnosis not present

## 2016-04-14 NOTE — Therapy (Addendum)
[address] [place], [place], [address] Phone: [phone]   Fax:  [phone]  Physical Therapy Treatment  Patient Details  Name: Robert Hines MRN: [id number] Date of Birth: Oct 06, 1955 Referring Provider: Dr. Tania Ade   Encounter Date: 04/14/2016      PT End of Session - 04/14/16 1623    Visit Number 26   Number of Visits 36   Date for PT Re-Evaluation 04/27/16   PT Start Time 5916   PT Stop Time 1655   PT Time Calculation (min) 38 min   Activity Tolerance Patient tolerated treatment well;No increased pain   Behavior During Therapy WFL for tasks assessed/performed      Past Medical History:  Diagnosis Date  . Arthritis    back  . Depression   . Diabetes mellitus without complication ([place])   . Hyperlipidemia   . Hypertension   . Renal disorder    calculi, multiple lithotripsies, stents  . Rotator cuff disorder   . Spinal pain     Past Surgical History:  Procedure Laterality Date  . ANTERIOR FUSION CERVICAL SPINE    . FOOT SURGERY    . HERNIA REPAIR    . LITHOTRIPSY    . SHOULDER ARTHROSCOPY WITH ROTATOR CUFF REPAIR AND SUBACROMIAL DECOMPRESSION Right 12/28/2015   Procedure: SHOULDER ARTHROSCOPY TAKE DOWN ROTATOR CUFF REPAIR AND SUBACROMIAL DECOMPRESSION, DEBRIDEMENT OF SLAP TEAR;  Surgeon: Tania Ade, MD;  Location: [facility];  Service: Orthopedics;  Laterality: Right;  SHOULDER ARTHROSCOPY TAKE DOWN ROTATOR CUFF REPAIR AND SUBACROMIAL DECOMPRESSION  . VASECTOMY Bilateral     There were no vitals filed for this visit.      Subjective Assessment - 04/14/16 1657    Subjective Pt reports his Rt shoulder has finally calmed down since the session a week ago.  He is able to reach behind back a little better.  Overall he is pleased with how things are moving.    Currently in Pain? No/denies   Pain Score 0-No pain            [facility] PT Assessment - 04/14/16  0001      Assessment   Medical Diagnosis Rt RCR; DCR; SAD    Referring Provider Dr. Tania Ade    Onset Date/Surgical Date 12/28/15   Hand Dominance Right   Next MD Visit 04/20/16     ROM / Strength   AROM / PROM / Strength AROM;Strength     AROM   Right/Left Shoulder Right   Right Shoulder Extension 57 Degrees   Right Shoulder Flexion 147 Degrees  standing,  154 deg in supine   Right Shoulder ABduction 145 Degrees  standing   Right Shoulder External Rotation 70 Degrees  supine,  57 deg internal rotation in supine     PROM   Right/Left Shoulder --     Strength   Strength Assessment Site Shoulder   Right/Left Shoulder Right   Right Shoulder Flexion 5/5   Right Shoulder Extension 5/5   Right Shoulder ABduction --  5-/5, some discomfort   Right Shoulder Internal Rotation 5/5   Right Shoulder External Rotation 5/5            [facility] Adult PT Treatment/Exercise - 04/14/16 0001      Shoulder Exercises: Supine   Flexion AROM;Right;10 reps   Other Supine Exercises Rt sash with green band x 10 reps     Shoulder Exercises: Standing   External Rotation Strengthening;Both;15 reps;Theraband  Theraband Level (Shoulder External Rotation) Level 3 (Green)   Extension Strengthening;Both;12 reps;Theraband   Theraband Level (Shoulder Extension) Level 3 (Green)   Row Strengthening;Both;12 reps   Theraband Level (Shoulder Row) Level 3 (Green)   Other Standing Exercises Sash with red band x 10 reps, green band x 5 reps      Shoulder Exercises: Pulleys   Flexion 2 minutes  10 sec holds   ABduction 2 minutes  10 sec holds   Other Pulley Exercises IR behind back x 20 sec x 2 reps     Shoulder Exercises: Stretch   Corner Stretch 30 seconds;5 reps     Moist Heat Therapy   Number Minutes Moist Heat --  pt declined.              PT Short Term Goals - 02/24/16 1315      PT SHORT TERM GOAL #1   Title Improve posture and alignment with patient engaging posterior  shoulder girdle musculature 02/09/16   Time 6   Period Weeks   Status Achieved     PT SHORT TERM GOAL #2   Title Initiate AAROM/AROM exercises, progressing per protocol 02/09/16   Time 6   Period Weeks   Status Achieved     PT SHORT TERM GOAL #3   Title Education re posture; alignment; progressive exercise program; rehab 02/09/16   Time 6   Period Weeks   Status Achieved           PT Long Term Goals - 04/14/16 1649      PT LONG TERM GOAL #1   Title Improve posture and alignment with patient demonstrating posterior shoulder girdle control for UE function 04/27/16   Time 18   Period Weeks     PT LONG TERM GOAL #2   Title Improve Rt shoulder ROM to equal or greater than Lt shoulder ROM 04/27/16   Time 18   Period Weeks   Status On-going     PT LONG TERM GOAL #3   Title Rt shoulder strength =/> 5-/5 without pain 04/27/16   Time 18   Period Weeks   Status Achieved     PT LONG TERM GOAL #4   Title Independent in advanced HEP 04/27/16   Time 18   Period Weeks   Status On-going     PT LONG TERM GOAL #5   Title improve FOTO =/< 39% impaired 04/27/16   Time 18   Period Weeks   Status On-going               Plan - 04/14/16 1659    Clinical Impression Statement Pt tolerated increased resistance with exercises for Rt shoulder.  He demonstrated improved strength and ROM; has met LTG # 2 and 3 and is making good gains towards remaining goals.     Rehab Potential Good   PT Frequency 2x / week   PT Treatment/Interventions Patient/family education;ADLs/Self Care Home Management;Cryotherapy;Electrical Stimulation;Iontophoresis 50m/ml Dexamethasone;Moist Heat;Traction;Ultrasound;Manual techniques;Dry needling;Therapeutic activities;Therapeutic exercise   PT Next Visit Plan Pt returns to MD 11/1; requests to hold therapy until he see MD.     Consulted and Agree with Plan of Care Patient      Patient will benefit from skilled therapeutic intervention in order to improve the  following deficits and impairments:  Postural dysfunction, Improper body mechanics, Pain, Impaired UE functional use, Decreased range of motion, Decreased mobility, Decreased strength, Decreased activity tolerance  Visit Diagnosis: Acute pain of right shoulder  Abnormal posture  Weakness of shoulder  Muscle weakness (generalized)     Problem List Patient Active Problem List   Diagnosis Date Noted  . Skin rash 02/23/2016  . Ingrown right big toenail 02/16/2016  . Obesity 01/23/2015  . Right shoulder pain 01/23/2015  . Former smoker 08/28/2014  . Nephrolithiasis 07/25/2014  . Diabetes mellitus, type 2 ([place]) 07/25/2014  . Essential hypertension, benign 07/25/2014  . Annual physical exam 07/25/2014  . Hyperlipidemia 07/25/2014   Kerin Perna, PTA 04/14/16 5:03 PM  [facility] [place] [address] [place], [place], [address] Phone: [phone]   Fax:  [phone]  Name: Robert Hines MRN: [id number] Date of Birth: 02-Jul-1955  PHYSICAL THERAPY DISCHARGE SUMMARY  Visits from Start of Care: 26  Current functional level related to goals / functional outcomes: See last PT progress note for discharge status.   Remaining deficits: Continued intermittent pain and discomfort; difficulty sleeping.   Education / Equipment: HEP Plan: Patient agrees to discharge.  Patient goals were partially met. Patient is being discharged due to the patient's request.  ?????    Celyn P. Helene Kelp PT, MPH 05/24/16 8:18 AM

## 2016-04-22 ENCOUNTER — Other Ambulatory Visit: Payer: Self-pay | Admitting: Sports Medicine

## 2016-05-02 ENCOUNTER — Ambulatory Visit (INDEPENDENT_AMBULATORY_CARE_PROVIDER_SITE_OTHER): Payer: Managed Care, Other (non HMO) | Admitting: Sports Medicine

## 2016-05-02 ENCOUNTER — Other Ambulatory Visit: Payer: Self-pay | Admitting: Sports Medicine

## 2016-05-02 ENCOUNTER — Encounter: Payer: Self-pay | Admitting: Sports Medicine

## 2016-05-02 DIAGNOSIS — F329 Major depressive disorder, single episode, unspecified: Secondary | ICD-10-CM | POA: Diagnosis not present

## 2016-05-02 DIAGNOSIS — R21 Rash and other nonspecific skin eruption: Secondary | ICD-10-CM | POA: Diagnosis not present

## 2016-05-02 DIAGNOSIS — F32A Depression, unspecified: Secondary | ICD-10-CM | POA: Insufficient documentation

## 2016-05-02 DIAGNOSIS — E119 Type 2 diabetes mellitus without complications: Secondary | ICD-10-CM | POA: Diagnosis not present

## 2016-05-02 DIAGNOSIS — G8929 Other chronic pain: Secondary | ICD-10-CM

## 2016-05-02 DIAGNOSIS — Z794 Long term (current) use of insulin: Secondary | ICD-10-CM

## 2016-05-02 DIAGNOSIS — Z0000 Encounter for general adult medical examination without abnormal findings: Secondary | ICD-10-CM | POA: Diagnosis not present

## 2016-05-02 DIAGNOSIS — M25511 Pain in right shoulder: Secondary | ICD-10-CM

## 2016-05-02 MED ORDER — BASAGLAR KWIKPEN 100 UNIT/ML ~~LOC~~ SOPN: 84.0000 [IU] | PEN_INJECTOR | Freq: Every day | SUBCUTANEOUS | 11 refills | Status: DC

## 2016-05-02 MED ORDER — TRAMADOL HCL 50 MG PO TABS: 50.0000 mg | ORAL_TABLET | Freq: Every day | ORAL | 0 refills | Status: DC

## 2016-05-02 MED ORDER — BUPROPION HCL ER (XL) 450 MG PO TB24: 1.0000 | ORAL_TABLET | Freq: Every day | ORAL | 3 refills | Status: DC

## 2016-05-02 NOTE — Assessment & Plan Note (Signed)
Adding HIV and hepatitis C screening

## 2016-05-02 NOTE — Assessment & Plan Note (Signed)
Increasing Wellbutrin to 450 mg

## 2016-05-02 NOTE — Assessment & Plan Note (Signed)
Toujeo no longer covered. Switching to basaglar which is the preferred agent.

## 2016-05-02 NOTE — Assessment & Plan Note (Signed)
Having some skin maceration between the toes, he will restart his Lotrisone cream.

## 2016-05-02 NOTE — Progress Notes (Signed)
  Subjective:    CC: Follow-up  HPI: Diabetes mellitus type 2: Toujeo is no longer covered, he needs to switch to the preferred agent Basaglar.  Preventive measures: Due for hepatitis C and HIV screening.  Has already had his influenza vaccine.  Skin rash: Localized on the plantar aspect of the toes. Asymptomatic.  Depression: Still feels some depressive symptoms, would like to go up on his Wellbutrin.  Past medical history:  Negative.  See flowsheet/record as well for more information.  Surgical history: Negative.  See flowsheet/record as well for more information.  Family history: Negative.  See flowsheet/record as well for more information.  Social history: Negative.  See flowsheet/record as well for more information.  Allergies, and medications have been entered into the medical record, reviewed, and no changes needed.   Review of Systems: No fevers, chills, night sweats, weight loss, chest pain, or shortness of breath.   Objective:    General: Well Developed, well nourished, and in no acute distress.  Neuro: Alert and oriented x3, extra-ocular muscles intact, sensation grossly intact.  HEENT: Normocephalic, atraumatic, pupils equal round reactive to light, neck supple, no masses, no lymphadenopathy, thyroid nonpalpable.  Skin: Warm and dry, no rashes. Cardiac: Regular rate and rhythm, no murmurs rubs or gallops, no lower extremity edema.  Respiratory: Clear to auscultation bilaterally. Not using accessory muscles, speaking in full sentences. Feet: There is some maceration under the toes.  Impression and Recommendations:    Diabetes mellitus, type 2 Toujeo no longer covered. Switching to basaglar which is the preferred agent.  Annual physical exam Adding HIV and hepatitis C screening  Skin rash Having some skin maceration between the toes, he will restart his Lotrisone cream.  Depression Increasing Wellbutrin to 450 mg

## 2016-05-03 ENCOUNTER — Telehealth: Payer: Self-pay | Admitting: *Deleted

## 2016-05-03 LAB — HEPATITIS C ANTIBODY: HCV Ab: NEGATIVE

## 2016-05-03 LAB — HIV ANTIBODY (ROUTINE TESTING W REFLEX): HIV 1&2 Ab, 4th Generation: NONREACTIVE

## 2016-05-04 NOTE — Telephone Encounter (Signed)
[id number] - PA Case ID: [id number]

## 2016-05-06 ENCOUNTER — Other Ambulatory Visit: Payer: Self-pay | Admitting: Sports Medicine

## 2016-05-06 ENCOUNTER — Other Ambulatory Visit: Payer: Self-pay | Admitting: *Deleted

## 2016-05-06 MED ORDER — GABAPENTIN 300 MG PO CAPS: ORAL_CAPSULE | ORAL | 3 refills | Status: DC

## 2016-05-06 MED ORDER — INSULIN GLARGINE 100 UNIT/ML SOLOSTAR PEN: 84.0000 [IU] | PEN_INJECTOR | Freq: Every day | SUBCUTANEOUS | 3 refills | Status: DC

## 2016-05-06 NOTE — Telephone Encounter (Signed)
Looks like Lantus is covered, Hospital doctorBasaglar is not.

## 2016-05-10 ENCOUNTER — Other Ambulatory Visit: Payer: Self-pay

## 2016-05-10 DIAGNOSIS — F32A Depression, unspecified: Secondary | ICD-10-CM

## 2016-05-10 DIAGNOSIS — F329 Major depressive disorder, single episode, unspecified: Secondary | ICD-10-CM

## 2016-05-10 MED ORDER — BUPROPION HCL ER (XL) 450 MG PO TB24: 1.0000 | ORAL_TABLET | Freq: Every day | ORAL | 3 refills | Status: DC

## 2016-05-20 NOTE — Telephone Encounter (Signed)
Your request has been denied  PA Case: [id number], Status: Denied. Notification: Completed.  Basaglar was d/c from med list by Dr. Benjamin Stainhekkekandam 05/06/16. lantus Solostar written on the same date

## 2016-06-06 ENCOUNTER — Other Ambulatory Visit: Payer: Self-pay | Admitting: Sports Medicine

## 2016-06-23 ENCOUNTER — Encounter: Payer: Self-pay | Admitting: Sports Medicine

## 2016-06-23 ENCOUNTER — Ambulatory Visit (INDEPENDENT_AMBULATORY_CARE_PROVIDER_SITE_OTHER): Payer: Managed Care, Other (non HMO) | Admitting: Sports Medicine

## 2016-06-23 DIAGNOSIS — E119 Type 2 diabetes mellitus without complications: Secondary | ICD-10-CM

## 2016-06-23 DIAGNOSIS — Z794 Long term (current) use of insulin: Secondary | ICD-10-CM

## 2016-06-23 DIAGNOSIS — I10 Essential (primary) hypertension: Secondary | ICD-10-CM

## 2016-06-23 LAB — POCT URINALYSIS DIPSTICK
Bilirubin, UA: NEGATIVE
Blood, UA: NEGATIVE
Glucose, UA: 500
Ketones, UA: NEGATIVE
Leukocytes, UA: NEGATIVE
Nitrite, UA: NEGATIVE
Protein, UA: NEGATIVE
Spec Grav, UA: 1.015
Urobilinogen, UA: 0.2
pH, UA: 6.5

## 2016-06-23 LAB — GLUCOSE, POCT (MANUAL RESULT ENTRY): POC Glucose: 173 mg/dl — AB (ref 70–99)

## 2016-06-23 LAB — POCT GLYCOSYLATED HEMOGLOBIN (HGB A1C): Hemoglobin A1C: 7.7

## 2016-06-23 MED ORDER — INSULIN GLARGINE 300 UNIT/ML ~~LOC~~ SOPN: 84.0000 [IU] | PEN_INJECTOR | Freq: Every day | SUBCUTANEOUS | 11 refills | Status: DC

## 2016-06-23 NOTE — Patient Instructions (Signed)
Hold off on lisinopril/HCTZ for 1 week, then for the next week just do one half tab daily. We will ultimately restart this to a full tab.

## 2016-06-23 NOTE — Assessment & Plan Note (Signed)
Overall dehydrated. He will hold off on his lisinopril/HCTZ for one week and then restart at one half tab daily.

## 2016-06-23 NOTE — Progress Notes (Signed)
  Subjective:    CC: Feeling bad  HPI: For the past month this pleasant 61 year old male has felt dizzy on standing, somewhat shaky, his vision is blurry or, was noted to have cataracts on a recent diabetic eye exam. No chest pain, shortness of breath. No palpitations. Somewhat foggy thinking.  Past medical history:  Negative.  See flowsheet/record as well for more information.  Surgical history: Negative.  See flowsheet/record as well for more information.  Family history: Negative.  See flowsheet/record as well for more information.  Social history: Negative.  See flowsheet/record as well for more information.  Allergies, and medications have been entered into the medical record, reviewed, and no changes needed.   Review of Systems: No fevers, chills, night sweats, weight loss, chest pain, or shortness of breath.   Objective:    General: Well Developed, well nourished, and in no acute distress.  Neuro: Alert and oriented x3, extra-ocular muscles intact, sensation grossly intact.  HEENT: Normocephalic, atraumatic, pupils equal round reactive to light, neck supple, no masses, no lymphadenopathy, thyroid nonpalpable.  Skin: Warm and dry, no rashes. Cardiac: Regular rate and rhythm, no murmurs rubs or gallops, no lower extremity edema.  Respiratory: Clear to auscultation bilaterally. Not using accessory muscles, speaking in full sentences.  Urinalysis is positive for greater than 500 glycosuria  Blood sugar 173, A1c 7.7%  2 L normal saline infused IV through the right dorsal venous plexus of the hand.  Impression and Recommendations:    Diabetes mellitus, type 2 Significant glucosuria, elevated capillary blood sugar, also with elevated morning sugars. He will continue the up titration of his insulin with a goal blood sugar of 100. Return to see me in one month.   Essential hypertension, benign Overall dehydrated. He will hold off on his lisinopril/HCTZ for one week and then  restart at one half tab daily.

## 2016-06-23 NOTE — Assessment & Plan Note (Addendum)
Significant glucosuria, elevated capillary blood sugar, also with elevated morning sugars. He will continue the up titration of his insulin (patient tells me he is certain that he is on Toujeo and not Lantus) with a goal blood sugar of 100. Return to see me in one month.

## 2016-06-24 ENCOUNTER — Telehealth: Payer: Self-pay | Admitting: *Deleted

## 2016-06-24 LAB — COMPREHENSIVE METABOLIC PANEL
ALT: 34 U/L (ref 9–46)
Alkaline Phosphatase: 55 U/L (ref 40–115)
BUN: 18 mg/dL (ref 7–25)
CO2: 25 mmol/L (ref 20–31)
Chloride: 104 mmol/L (ref 98–110)
Glucose, Bld: 126 mg/dL — ABNORMAL HIGH (ref 65–99)
Potassium: 4.2 mmol/L (ref 3.5–5.3)

## 2016-06-24 LAB — CBC
HCT: 44.4 % (ref 38.5–50.0)
Hemoglobin: 14.8 g/dL (ref 13.2–17.1)
MCH: 32.2 pg (ref 27.0–33.0)
MCHC: 33.3 g/dL (ref 32.0–36.0)
MCV: 96.7 fL (ref 80.0–100.0)
MPV: 10.2 fL (ref 7.5–12.5)
Platelets: 164 10*3/uL (ref 140–400)
RBC: 4.59 MIL/uL (ref 4.20–5.80)
RDW: 13.7 % (ref 11.0–15.0)
WBC: 6.5 K/uL (ref 3.8–10.8)

## 2016-06-24 LAB — COMPREHENSIVE METABOLIC PANEL WITH GFR
AST: 22 U/L (ref 10–35)
Albumin: 4.1 g/dL (ref 3.6–5.1)
Calcium: 8.4 mg/dL — ABNORMAL LOW (ref 8.6–10.3)
Creat: 1.08 mg/dL (ref 0.70–1.25)
Sodium: 139 mmol/L (ref 135–146)
Total Bilirubin: 0.4 mg/dL (ref 0.2–1.2)
Total Protein: 6.1 g/dL (ref 6.1–8.1)

## 2016-06-24 LAB — TSH: TSH: 1.34 m[IU]/L (ref 0.40–4.50)

## 2016-06-24 NOTE — Telephone Encounter (Signed)
PA submitted for [email or web address].KeyShellia Carwin: [email or web address] - PA Case ID: [id number]

## 2016-07-01 ENCOUNTER — Ambulatory Visit (INDEPENDENT_AMBULATORY_CARE_PROVIDER_SITE_OTHER): Payer: Managed Care, Other (non HMO) | Admitting: Sports Medicine

## 2016-07-01 ENCOUNTER — Encounter: Payer: Self-pay | Admitting: Sports Medicine

## 2016-07-01 DIAGNOSIS — I10 Essential (primary) hypertension: Secondary | ICD-10-CM | POA: Diagnosis not present

## 2016-07-01 DIAGNOSIS — E119 Type 2 diabetes mellitus without complications: Secondary | ICD-10-CM

## 2016-07-01 DIAGNOSIS — Z794 Long term (current) use of insulin: Secondary | ICD-10-CM | POA: Diagnosis not present

## 2016-07-01 MED ORDER — LISINOPRIL 20 MG PO TABS: 20.0000 mg | ORAL_TABLET | Freq: Every day | ORAL | 3 refills | Status: DC

## 2016-07-01 MED ORDER — INSULIN GLARGINE 100 UNIT/ML SOLOSTAR PEN: 92.0000 [IU] | PEN_INJECTOR | Freq: Every day | SUBCUTANEOUS | 99 refills | Status: DC

## 2016-07-01 NOTE — Assessment & Plan Note (Signed)
Got dehydrated on lisinopril/HCTZ. We discontinued the medication and gave him a couple of liters of IV fluids and symptoms resolved. For now and hold off on the diuretic and just do lisinopril 20, he sleeps better now that he is not voiding through the night. He does not want to use lower extremity compression hose, and can deal with swollen legs.

## 2016-07-01 NOTE — Progress Notes (Signed)
  Subjective:    CC: Follow-up  HPI: Robert Hines returns, this is a pleasant 61 year old male, he was hypovolemic, we gave him 2 L of IV fluids and discontinued his lisinopril/HCTZ. He feels significantly better today. As expected his legs are swelling but he is sleeping better not voiding through the night. Blood sugars have been running approximately 100-110 in the mornings on 92 units of Lantus.  Past medical history:  Negative.  See flowsheet/record as well for more information.  Surgical history: Negative.  See flowsheet/record as well for more information.  Family history: Negative.  See flowsheet/record as well for more information.  Social history: Negative.  See flowsheet/record as well for more information.  Allergies, and medications have been entered into the medical record, reviewed, and no changes needed.   Review of Systems: No fevers, chills, night sweats, weight loss, chest pain, or shortness of breath.   Objective:    General: Well Developed, well nourished, and in no acute distress.  Neuro: Alert and oriented x3, extra-ocular muscles intact, sensation grossly intact.  HEENT: Normocephalic, atraumatic, pupils equal round reactive to light, neck supple, no masses, no lymphadenopathy, thyroid nonpalpable.  Skin: Warm and dry, no rashes. Cardiac: Regular rate and rhythm, no murmurs rubs or gallops, 2+ lower extremity edema.  Respiratory: Clear to auscultation bilaterally. Not using accessory muscles, speaking in full sentences.  Impression and Recommendations:    Diabetes mellitus, type 2 Now at 92 units of Lantus, blood sugars are well-controlled on the mornings at 100.  Essential hypertension, benign Got dehydrated on lisinopril/HCTZ. We discontinued the medication and gave him a couple of liters of IV fluids and symptoms resolved. For now and hold off on the diuretic and just do lisinopril 20, he sleeps better now that he is not voiding through the night. He does not want  to use lower extremity compression hose, and can deal with swollen legs.  I spent 25 minutes with this patient, greater than 50% was face-to-face time counseling regarding the above diagnoses

## 2016-07-01 NOTE — Assessment & Plan Note (Signed)
Now at 92 units of Lantus, blood sugars are well-controlled on the mornings at 100.

## 2016-07-17 ENCOUNTER — Other Ambulatory Visit: Payer: Self-pay | Admitting: Sports Medicine

## 2016-07-19 NOTE — Telephone Encounter (Signed)
toujeo has been approved through insurance. Patient aware

## 2016-08-04 IMAGING — CT CT ABD-PELV W/ CM
2 of 5 series · 16 of 46 positions shown, 18 images · IV contrast (APPLIED)
Comparison: None.

CLINICAL DATA: Constipation and vomiting for several days. Three
days postop from right shoulder surgery.

EXAM:
CT ABDOMEN AND PELVIS WITH CONTRAST
TECHNIQUE: Multidetector CT imaging of the abdomen and pelvis was performed
using the standard protocol following bolus administration of
intravenous contrast.
CONTRAST:  100 mL Isovue 300

[Series 2: abd/ pelvis 5.0 i30f 1 · axial · 0.81mm/px · z∈[-568,-113]mm · 13 of 103 slices shown, 15 images]
[im 6/103  soft-tissue]
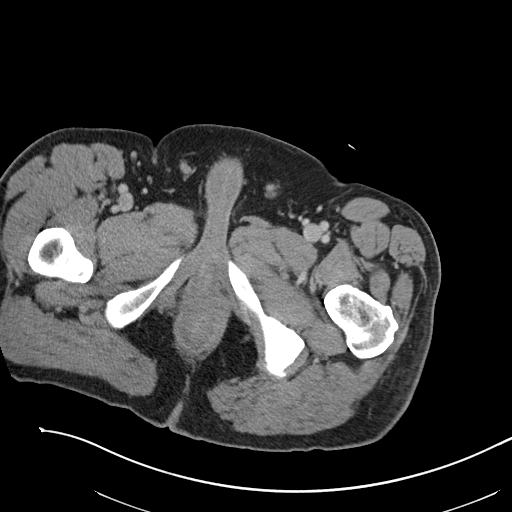
[im 6/103  bone]
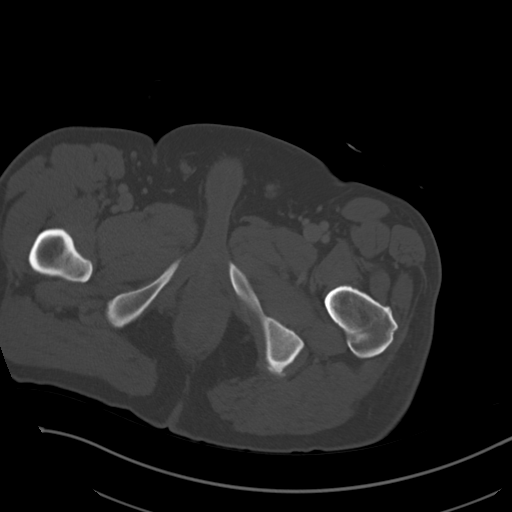
[im 17/103  soft-tissue]
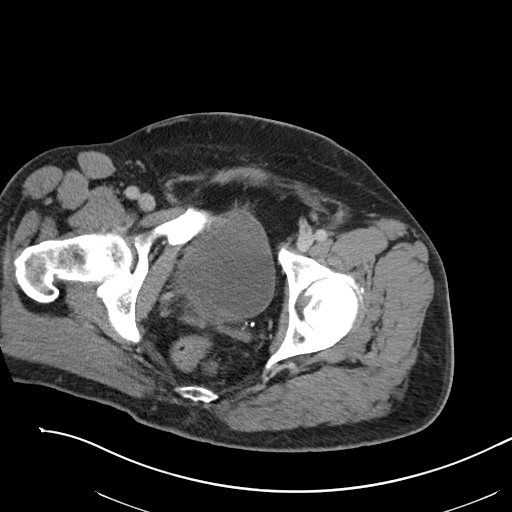
[im 22/103  soft-tissue]
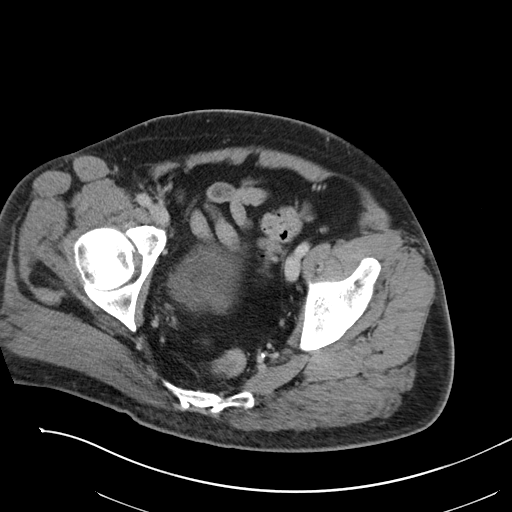
[im 27/103  soft-tissue]
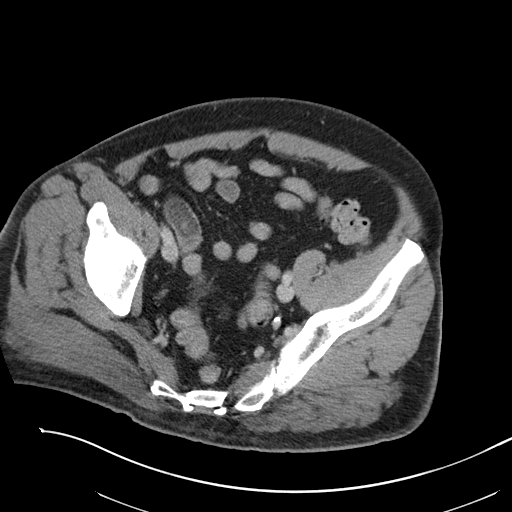
[im 38/103  soft-tissue]
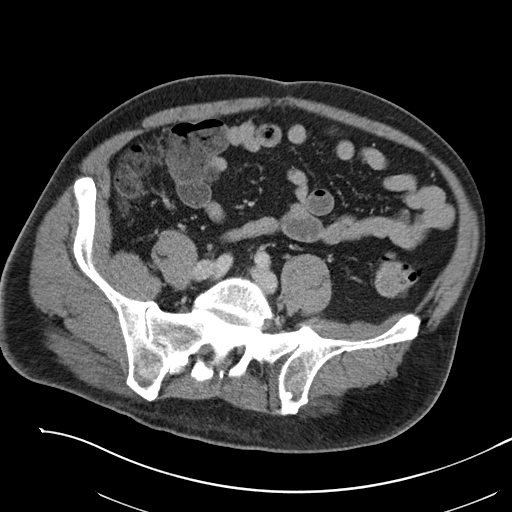
[im 43/103  soft-tissue]
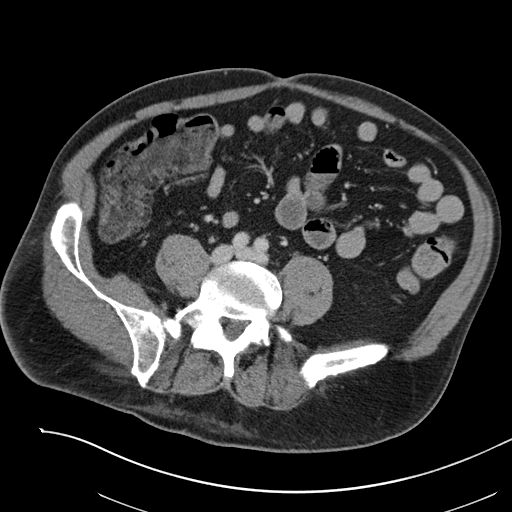
[im 54/103  soft-tissue]
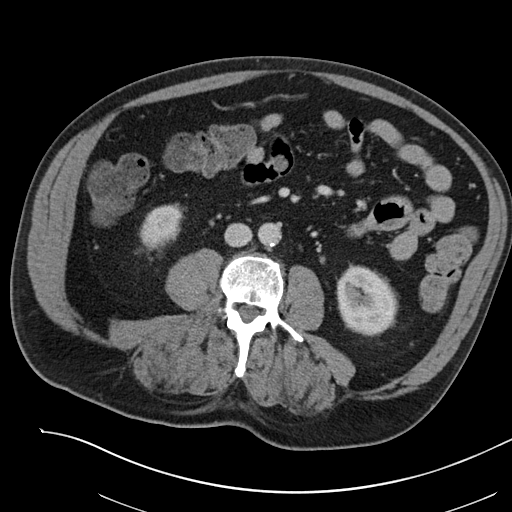
[im 60/103  soft-tissue]
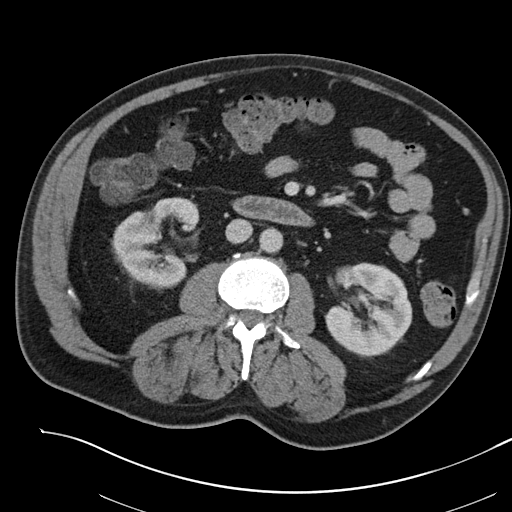
[im 65/103  soft-tissue]
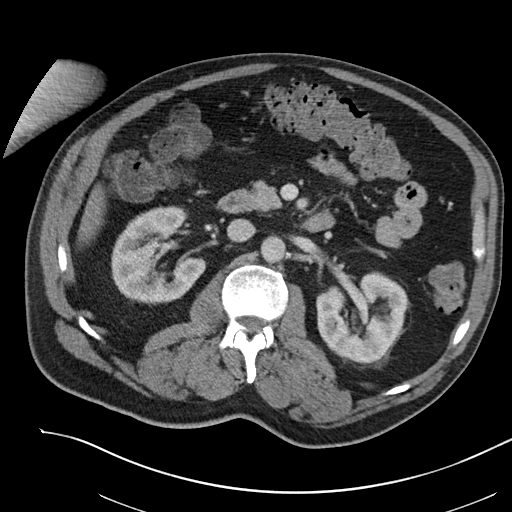
[im 65/103  bone]
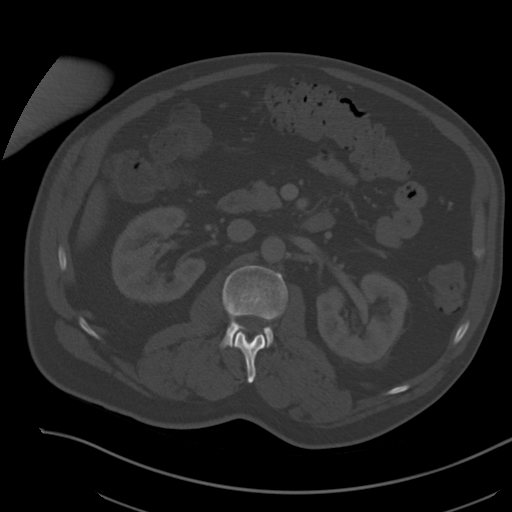
[im 76/103  soft-tissue]
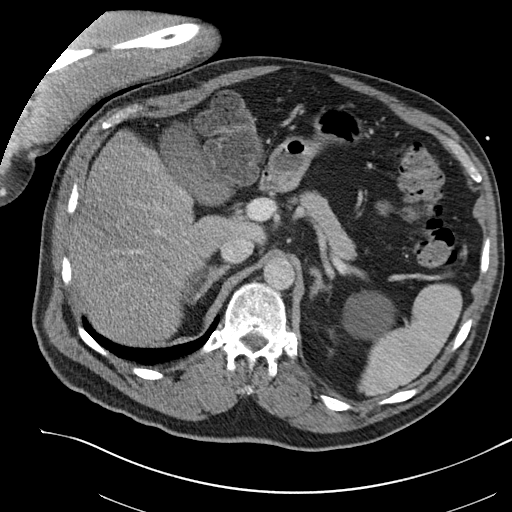
[im 81/103  soft-tissue]
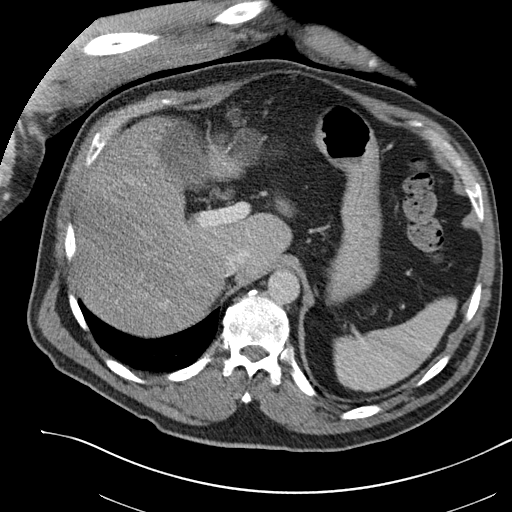
[im 86/103  soft-tissue]
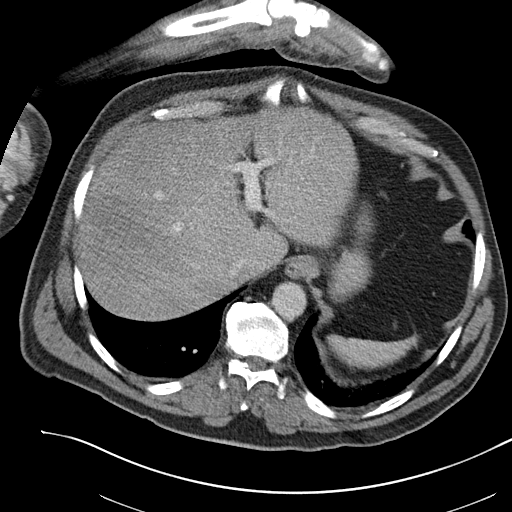
[im 97/103  soft-tissue]
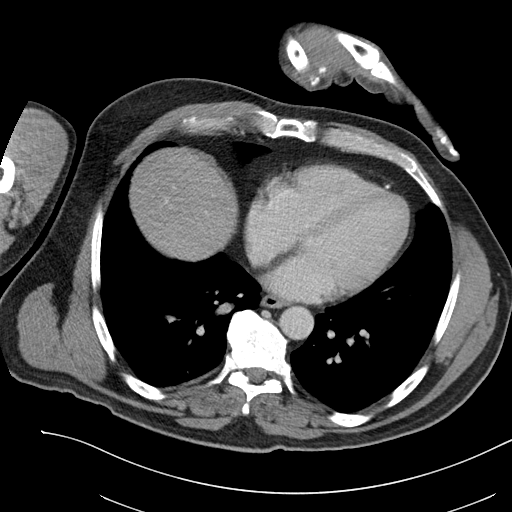

[Series 5: coronal soft tissue · coronal · 0.80mm/px · 3 of 101 slices shown]
[im 34/101  soft-tissue]
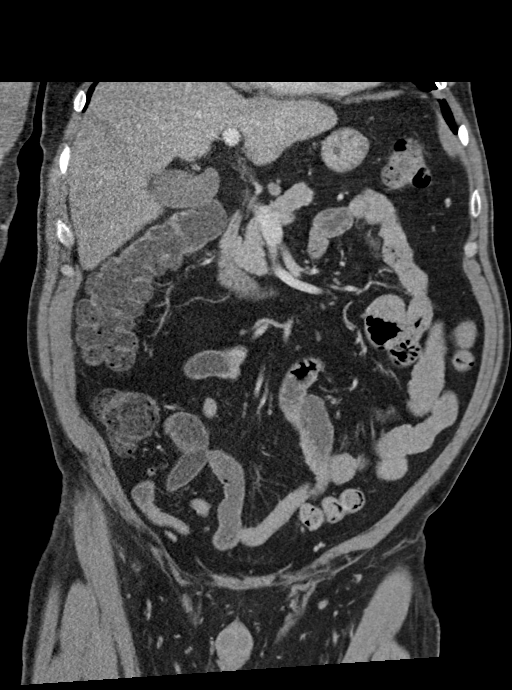
[im 45/101  soft-tissue]
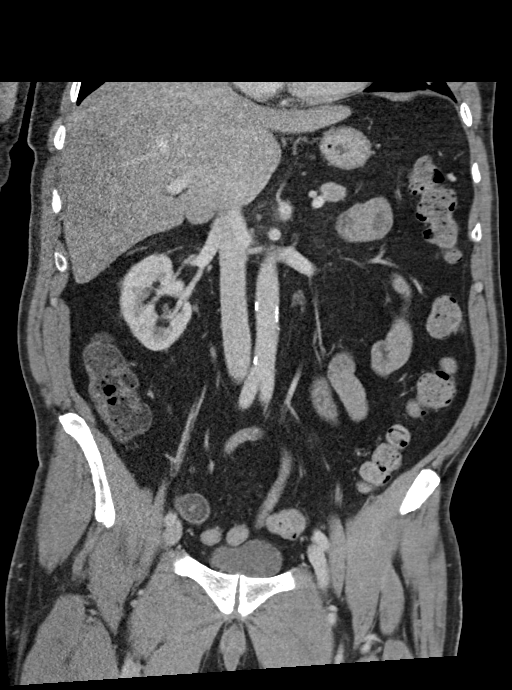
[im 56/101  soft-tissue]
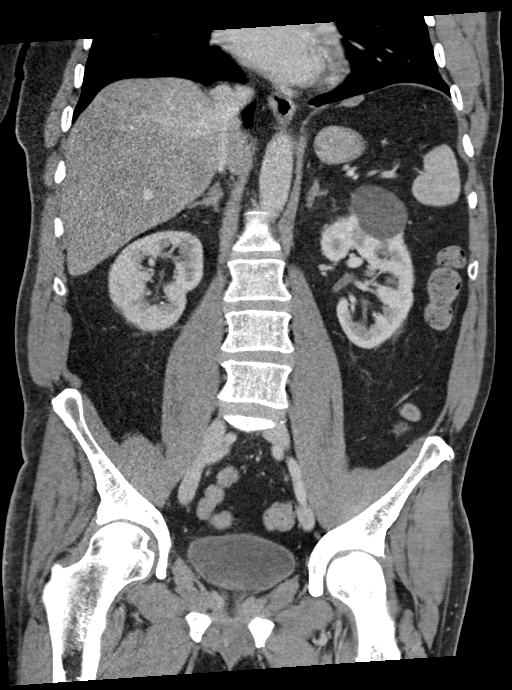

[16 of 46 positions shown; findings below may reference images not displayed]

FINDINGS: Lower chest:  Mild bibasilar atelectasis or scarring.

Hepatobiliary: Mild hepatic steatosis. No liver masses identified.
Gallbladder is unremarkable.

Pancreas: No mass, inflammatory changes, or other significant
abnormality.

Spleen: Within normal limits in size and appearance.

Adrenals/Urinary Tract: Normal adrenal glands. Tiny less than 5 mm
bilateral intrarenal calculi. No evidence of hydronephrosis. Several
simple left renal cysts. No evidence of renal mass. No evidence of
ureteral calculi or dilatation. Unopacified urinary bladder is
unremarkable in appearance.

Stomach/Bowel: No evidence of obstruction, inflammatory process, or
abnormal fluid collections. Moderate colonic stool burden noted,
without dilatation.

Vascular/Lymphatic: No pathologically enlarged lymph nodes. No
evidence of abdominal aortic aneurysm. Aortic atherosclerosis noted.

Reproductive: No mass or other significant abnormality.

Other: None.

Musculoskeletal:  No suspicious bone lesions identified.
IMPRESSION: No acute findings identified within the abdomen or pelvis.

Bilateral nonobstructive renal calculi and left renal cysts. No
evidence of ureteral calculi or hydronephrosis.

Aortic atherosclerosis noted.

Mild bibasilar atelectasis versus scarring.

## 2016-08-19 ENCOUNTER — Ambulatory Visit (INDEPENDENT_AMBULATORY_CARE_PROVIDER_SITE_OTHER): Payer: Managed Care, Other (non HMO) | Admitting: Sports Medicine

## 2016-08-19 DIAGNOSIS — F32A Depression, unspecified: Secondary | ICD-10-CM

## 2016-08-19 DIAGNOSIS — Z794 Long term (current) use of insulin: Secondary | ICD-10-CM

## 2016-08-19 DIAGNOSIS — F329 Major depressive disorder, single episode, unspecified: Secondary | ICD-10-CM

## 2016-08-19 DIAGNOSIS — E119 Type 2 diabetes mellitus without complications: Secondary | ICD-10-CM

## 2016-08-19 MED ORDER — BUPROPION HCL ER (XL) 300 MG PO TB24: 300.0000 mg | ORAL_TABLET | Freq: Every day | ORAL | 0 refills | Status: DC

## 2016-08-19 MED ORDER — ESCITALOPRAM OXALATE 5 MG PO TABS: 5.0000 mg | ORAL_TABLET | Freq: Every day | ORAL | 0 refills | Status: DC

## 2016-08-19 MED ORDER — DAPAGLIFLOZIN PRO-METFORMIN ER 10-1000 MG PO TB24: 1.0000 | ORAL_TABLET | Freq: Every day | ORAL | 3 refills | Status: DC

## 2016-08-19 MED ORDER — DULAGLUTIDE 1.5 MG/0.5ML ~~LOC~~ SOAJ: 1.5000 mL | SUBCUTANEOUS | 3 refills | Status: DC

## 2016-08-19 MED ORDER — INSULIN GLARGINE 300 UNIT/ML ~~LOC~~ SOPN: 92.0000 [IU] | PEN_INJECTOR | Freq: Every day | SUBCUTANEOUS | 3 refills | Status: DC

## 2016-08-19 MED ORDER — ZOLPIDEM TARTRATE 10 MG PO TABS: 10.0000 mg | ORAL_TABLET | Freq: Every evening | ORAL | 0 refills | Status: DC | PRN

## 2016-08-19 NOTE — Assessment & Plan Note (Signed)
Switching back down to Wellbutrin 300, and adding Lexapro 5.

## 2016-08-19 NOTE — Progress Notes (Signed)
  Subjective:    CC: Follow-up  HPI: Depression: Uncontrolled, we did increase to 450 mg of Wellbutrin which has caused increased agitation and irritability. He also has significant anxiety and uneasiness. No suicidal or homicidal ideation.  Diabetes mellitus type 2: Having difficulty finding a regimen of long-acting insulin that will be covered. We did get a letter from his insurance company telling us that Toujeo was approved. Agreeable to switch to this, does not want to use a vial that he has to pull up the dose in a syringe with.  Past medical history:  Negative.  See flowsheet/record as well for more information.  Surgical history: Negative.  See flowsheet/record as well for more information.  Family history: Negative.  See flowsheet/record as well for more information.  Social history: Negative.  See flowsheet/record as well for more information.  Allergies, and medications have been entered into the medical record, reviewed, and no changes needed.   Review of Systems: No fevers, chills, night sweats, weight loss, chest pain, or shortness of breath.   Objective:    General: Well Developed, well nourished, and in no acute distress.  Neuro: Alert and oriented x3, extra-ocular muscles intact, sensation grossly intact. Appears somewhat uneasy, there is some psychomotor restlessness. HEENT: Normocephalic, atraumatic, pupils equal round reactive to light, neck supple, no masses, no lymphadenopathy, thyroid nonpalpable.  Skin: Warm and dry, no rashes. Cardiac: Regular rate and rhythm, no murmurs rubs or gallops, no lower extremity edema.  Respiratory: Clear to auscultation bilaterally. Not using accessory muscles, speaking in full sentences.  Impression and Recommendations:    Depression Switching back down to Wellbutrin 300, and adding Lexapro 5.  Diabetes mellitus, type 2 Having issues with insulin coverage, refilling Trulicity, XigDuo, switcing insulin to Toujeo, we do have a  letter indicating coverage of this medication, discount coupon given.  I spent 40 minutes with this patient, greater than 50% was face-to-face time counseling regarding the above diagnoses

## 2016-08-19 NOTE — Assessment & Plan Note (Signed)
Having issues with insulin coverage, refilling Trulicity, XigDuo, switcing insulin to Toujeo, we do have a letter indicating coverage of this medication, discount coupon given.

## 2016-08-29 ENCOUNTER — Encounter: Payer: Self-pay | Admitting: Sports Medicine

## 2016-09-10 ENCOUNTER — Emergency Department
Admission: EM | Admit: 2016-09-10 | Discharge: 2016-09-10 | Disposition: A | Discharge status: Home / Self Care | Payer: Managed Care, Other (non HMO) | Source: Home / Self Care | Attending: Family Medicine | Admitting: Family Medicine

## 2016-09-10 ENCOUNTER — Encounter: Payer: Self-pay | Admitting: Emergency Medicine

## 2016-09-10 DIAGNOSIS — J029 Acute pharyngitis, unspecified: Secondary | ICD-10-CM

## 2016-09-10 MED ORDER — PENICILLIN V POTASSIUM 500 MG PO TABS: ORAL_TABLET | ORAL | 0 refills | Status: DC

## 2016-09-10 NOTE — ED Provider Notes (Signed)
[facility]    CSN: [id number] Arrival date & time: 09/10/16  4098     History   Chief Complaint Chief Complaint  Patient presents with  . Sore Throat    HPI Robert Hines is a 61 y.o. male.   Three days ago patient developed a left side sore throat and earache.  He noticed soreness in his left neck.  The next day he noted significant swelling in his uvula with persistent pain on swallowing.  His uvula has decreased in size but he still has sore throat.  No fevers, chills, and sweats.  Minimal nasal congestion and no cough.   The history is provided by the patient and the spouse.    Past Medical History:  Diagnosis Date  . Arthritis    back  . Depression   . Diabetes mellitus without complication (HCC)   . Hyperlipidemia   . Hypertension   . Renal disorder    calculi, multiple lithotripsies, stents  . Rotator cuff disorder   . Spinal pain     Patient Active Problem List   Diagnosis Date Noted  . Depression 05/02/2016  . Ingrown right big toenail 02/16/2016  . Obesity 01/23/2015  . Right shoulder pain 01/23/2015  . Former smoker 08/28/2014  . Nephrolithiasis 07/25/2014  . Diabetes mellitus, type 2 (HCC) 07/25/2014  . Essential hypertension, benign 07/25/2014  . Annual physical exam 07/25/2014  . Hyperlipidemia 07/25/2014    Past Surgical History:  Procedure Laterality Date  . ANTERIOR FUSION CERVICAL SPINE    . FOOT SURGERY    . HERNIA REPAIR    . LITHOTRIPSY    . SHOULDER ARTHROSCOPY WITH ROTATOR CUFF REPAIR AND SUBACROMIAL DECOMPRESSION Right 12/28/2015   Procedure: SHOULDER ARTHROSCOPY TAKE DOWN ROTATOR CUFF REPAIR AND SUBACROMIAL DECOMPRESSION, DEBRIDEMENT OF SLAP TEAR;  Surgeon: Jones Broom, MD;  Location: [facility];  Service: Orthopedics;  Laterality: Right;  SHOULDER ARTHROSCOPY TAKE DOWN ROTATOR CUFF REPAIR AND SUBACROMIAL DECOMPRESSION  . VASECTOMY Bilateral        Home Medications    Prior to Admission  medications   Medication Sig Start Date End Date Taking? Authorizing Provider  aspirin 81 MG chewable tablet Chew by mouth daily.    Historical Provider, MD  buPROPion (WELLBUTRIN XL) 300 MG 24 hr tablet Take 1 tablet (300 mg total) by mouth daily. 08/19/16   Monica Becton, MD  cyclobenzaprine (FLEXERIL) 10 MG tablet Take 10 mg by mouth 3 (three) times daily as needed for muscle spasms.    Historical Provider, MD  Dapagliflozin-Metformin HCl ER (XIGDUO XR) 03-999 MG TB24 Take 1 tablet by mouth daily. 08/19/16   Monica Becton, MD  Dulaglutide (TRULICITY) 1.5 MG/0.5ML SOPN Inject 1.5 mLs into the skin once a week. 08/19/16   Monica Becton, MD  escitalopram (LEXAPRO) 5 MG tablet Take 1 tablet (5 mg total) by mouth daily. 08/19/16   Monica Becton, MD  gabapentin (NEURONTIN) 300 MG capsule 3 caps in the morning, 4 caps in the evening. 05/06/16   Monica Becton, MD  Insulin Glargine (TOUJEO SOLOSTAR) 300 UNIT/ML SOPN Inject 92 Units into the skin at bedtime. 08/19/16   Monica Becton, MD  Insulin Pen Needle 32G X 4 MM MISC Use as needed with toujeo pen 08/04/15   Monica Becton, MD  lisinopril (PRINIVIL,ZESTRIL) 20 MG tablet Take 1 tablet (20 mg total) by mouth daily. 07/01/16   Monica Becton, MD  penicillin v potassium (VEETID) 500  MG tablet Take one tab by mouth twice daily for 10 days 09/10/16   Lattie HawStephen A Maleek Craver, MD  simvastatin (ZOCOR) 40 MG tablet TAKE 1 TABLET BY MOUTH DAILY 07/18/16   Monica Bectonhomas J Thekkekandam, MD  traMADol (ULTRAM) 50 MG tablet Take 1 tablet (50 mg total) by mouth at bedtime. 05/02/16   Monica Bectonhomas J Thekkekandam, MD  zolpidem (AMBIEN) 10 MG tablet Take 1 tablet (10 mg total) by mouth at bedtime as needed for sleep. 08/19/16   Monica Bectonhomas J Thekkekandam, MD    Family History Family History  Problem Relation Age of Onset  . Hypertension Mother   . Heart failure Father   . Diabetes Father   . Hypertension Father   . Heart failure Sister   .  Hypertension Sister     Social History Social History  Substance Use Topics  . Smoking status: Former Smoker    Quit date: 10/01/2014  . Smokeless tobacco: Never Used  . Alcohol use 0.0 oz/week     Comment: social     Allergies   Codeine and Duloxetine   Review of Systems Review of Systems + sore throat Rare cough No pleuritic pain No wheezing + nasal congestion No post-nasal drainage No sinus pain/pressure No itchy/red eyes ? earache No hemoptysis No SOB No fever/chills No nausea No vomiting No abdominal pain No diarrhea No urinary symptoms No skin rash + fatigue No myalgias No headache Used OTC meds without relief   Physical Exam Triage Vital Signs ED Triage Vitals  Enc Vitals Group     BP 09/10/16 1031 125/77     Pulse Rate 09/10/16 1031 79     Resp --      Temp 09/10/16 1031 97.6 F (36.4 C)     Temp Source 09/10/16 1031 Oral     SpO2 09/10/16 1031 97 %     Weight 09/10/16 1033 240 lb (108.9 kg)     Height --      Head Circumference --      Peak Flow --      Pain Score 09/10/16 1033 0     Pain Loc --      Pain Edu? --      Excl. in GC? --    No data found.   Updated Vital Signs BP 125/77 (BP Location: Right Arm)   Pulse 79   Temp 97.6 F (36.4 C) (Oral)   Wt 240 lb (108.9 kg)   SpO2 97%   BMI 31.66 kg/m   Visual Acuity Right Eye Distance:   Left Eye Distance:   Bilateral Distance:    Right Eye Near:   Left Eye Near:    Bilateral Near:     Physical Exam Nursing notes and Vital Signs reviewed. Appearance:  Patient appears stated age, and in no acute distress Eyes:  Pupils are equal, round, and reactive to light and accomodation.  Extraocular movement is intact.  Conjunctivae are not inflamed  Ears:  Canals normal.  Tympanic membranes normal.  Nose:  Normal turbinates.  No sinus tenderness.    Pharynx:  Uvula edematous.  Left posterior oropharynx erythematous. Neck:  Supple.  Tender enlarged tonsillar nodes  bilaterally. Lungs:  Clear to auscultation.  Breath sounds are equal.  Moving air well. Heart:  Regular rate and rhythm without murmurs, rubs, or gallops.  Abdomen:  Nontender without masses or hepatosplenomegaly.  Bowel sounds are present.  No CVA or flank tenderness.  Extremities:  No edema.  Skin:  No rash present.  UC Treatments / Results  Labs (all labs ordered are listed, but only abnormal results are displayed) Labs Reviewed  STREP A DNA PROBE    EKG  EKG Interpretation None       Radiology No results found.  Procedures Procedures (including critical care time)  Medications Ordered in UC Medications - No data to display   Initial Impression / Assessment and Plan / UC Course  I have reviewed the triage vital signs and the nursing notes.  Pertinent labs & imaging results that were available during my care of the patient were reviewed by me and considered in my medical decision making (see chart for details).    Suspect strep pharyngitis Throat culture pending. Begin pen VK Try warm salt water gargles for sore throat.  May take Tylenol as needed for fever, headache, sore throat, etc.  Followup with Family Doctor if not improved in 10 days.    Final Clinical Impressions(s) / UC Diagnoses   Final diagnoses:  Pharyngitis, unspecified etiology    New Prescriptions New Prescriptions   PENICILLIN V POTASSIUM (VEETID) 500 MG TABLET    Take one tab by mouth twice daily for 10 days     Lattie Haw, MD 09/10/16 1515

## 2016-09-10 NOTE — Discharge Instructions (Signed)
Try warm salt water gargles for sore throat.  May take Tylenol as needed for fever, headache, sore throat, etc.

## 2016-09-10 NOTE — ED Triage Notes (Signed)
Pt c/o sore throat and left ear pain x2 days. Denies fever.

## 2016-09-11 ENCOUNTER — Telehealth: Payer: Self-pay | Admitting: Emergency Medicine

## 2016-09-11 LAB — STREP A DNA PROBE: GASP: NOT DETECTED

## 2016-09-11 NOTE — Telephone Encounter (Signed)
Pt informed of throat culture results were negative.  Pt states that he is feeling somewhat better.  Will f/u with PCP as needed.  TMartin,CMA

## 2016-09-15 ENCOUNTER — Encounter: Payer: Self-pay | Admitting: Sports Medicine

## 2016-09-15 ENCOUNTER — Ambulatory Visit (INDEPENDENT_AMBULATORY_CARE_PROVIDER_SITE_OTHER): Payer: Managed Care, Other (non HMO) | Admitting: Sports Medicine

## 2016-09-15 DIAGNOSIS — F32A Depression, unspecified: Secondary | ICD-10-CM

## 2016-09-15 DIAGNOSIS — E119 Type 2 diabetes mellitus without complications: Secondary | ICD-10-CM

## 2016-09-15 DIAGNOSIS — F329 Major depressive disorder, single episode, unspecified: Secondary | ICD-10-CM | POA: Diagnosis not present

## 2016-09-15 DIAGNOSIS — Z794 Long term (current) use of insulin: Secondary | ICD-10-CM

## 2016-09-15 MED ORDER — ESCITALOPRAM OXALATE 10 MG PO TABS: 10.0000 mg | ORAL_TABLET | Freq: Every day | ORAL | 3 refills | Status: DC

## 2016-09-15 NOTE — Assessment & Plan Note (Signed)
Blood sugars in the morning run from the 80s to the low 100s. He will continue Trulicity, Tomasa BlaseXigDuo, Toujeo which she is currently taking at somewhere between 80 and 90 units at bedtime depending on what he eats.

## 2016-09-15 NOTE — Assessment & Plan Note (Signed)
Fantastic improvement in anxiety and depressive symptoms, increasing Lexapro 10 mg daily. Return in one month. He is getting anorgasmia, he is okay with this for now, we will only keep him on Lexapro for a few months.

## 2016-09-15 NOTE — Progress Notes (Signed)
  Subjective:    CC: Follow-up  HPI: Depression: Improved with the addition of Lexapro, irritability and uneasiness has improved as well with decreasing Wellbutrin back down to 300 mg. No suicidal or homicidal ideation. Unfortunately he has developed some sexual side effects. He is agreeable to continue the medication.  Diabetes mellitus type 2: Doing well. Only has had a few lows in the 60s, knows to eat carbohydrates and drink orange juice.  Past medical history:  Negative.  See flowsheet/record as well for more information.  Surgical history: Negative.  See flowsheet/record as well for more information.  Family history: Negative.  See flowsheet/record as well for more information.  Social history: Negative.  See flowsheet/record as well for more information.  Allergies, and medications have been entered into the medical record, reviewed, and no changes needed.   Review of Systems: No fevers, chills, night sweats, weight loss, chest pain, or shortness of breath.   Objective:    General: Well Developed, well nourished, and in no acute distress.  Neuro: Alert and oriented x3, extra-ocular muscles intact, sensation grossly intact.  HEENT: Normocephalic, atraumatic, pupils equal round reactive to light, neck supple, no masses, no lymphadenopathy, thyroid nonpalpable.  Skin: Warm and dry, no rashes. Cardiac: Regular rate and rhythm, no murmurs rubs or gallops, no lower extremity edema.  Respiratory: Clear to auscultation bilaterally. Not using accessory muscles, speaking in full sentences.  Impression and Recommendations:    Depression Fantastic improvement in anxiety and depressive symptoms, increasing Lexapro 10 mg daily. Return in one month. He is getting anorgasmia, he is okay with this for now, we will only keep him on Lexapro for a few months.  Diabetes mellitus, type 2 Blood sugars in the morning run from the 80s to the low 100s. He will continue Trulicity, Tomasa BlaseXigDuo, Toujeo which  she is currently taking at somewhere between 80 and 90 units at bedtime depending on what he eats.  I spent 25 minutes with this patient, greater than 50% was face-to-face time counseling regarding the above diagnoses

## 2016-09-30 ENCOUNTER — Encounter: Payer: Self-pay | Admitting: Sports Medicine

## 2016-09-30 ENCOUNTER — Ambulatory Visit (INDEPENDENT_AMBULATORY_CARE_PROVIDER_SITE_OTHER): Payer: Managed Care, Other (non HMO) | Admitting: Sports Medicine

## 2016-09-30 DIAGNOSIS — Z794 Long term (current) use of insulin: Secondary | ICD-10-CM | POA: Diagnosis not present

## 2016-09-30 DIAGNOSIS — E119 Type 2 diabetes mellitus without complications: Secondary | ICD-10-CM

## 2016-09-30 DIAGNOSIS — F329 Major depressive disorder, single episode, unspecified: Secondary | ICD-10-CM | POA: Diagnosis not present

## 2016-09-30 DIAGNOSIS — F32A Depression, unspecified: Secondary | ICD-10-CM

## 2016-09-30 MED ORDER — VORTIOXETINE HBR 5 MG PO TABS: 1.0000 | ORAL_TABLET | Freq: Every day | ORAL | 0 refills | Status: DC

## 2016-09-30 NOTE — Assessment & Plan Note (Signed)
Excellent response to Lexapro 10, unfortunately continues to have anorgasmia. Switching to Trintellix, he has failed multiple SSRIs, generics, with the same side effects.

## 2016-09-30 NOTE — Progress Notes (Signed)
  Subjective:    CC:  Follow-up  HPI:  Diabetes mellitus type 2: Has done extremely well with great compliance with his medications, hemoglobin A1c has dropped to 6.7% today.  Depression: Doing well with Lexapro 10, unfortunately has severe anorgasmia, he desires to switch to something without the side effect. Has failed several other SSRIs, generics.  Past medical history:  Negative.  See flowsheet/record as well for more information.  Surgical history: Negative.  See flowsheet/record as well for more information.  Family history: Negative.  See flowsheet/record as well for more information.  Social history: Negative.  See flowsheet/record as well for more information.  Allergies, and medications have been entered into the medical record, reviewed, and no changes needed.   Review of Systems: No fevers, chills, night sweats, weight loss, chest pain, or shortness of breath.   Objective:    General: Well Developed, well nourished, and in no acute distress.  Neuro: Alert and oriented x3, extra-ocular muscles intact, sensation grossly intact.  HEENT: Normocephalic, atraumatic, pupils equal round reactive to light, neck supple, no masses, no lymphadenopathy, thyroid nonpalpable.  Skin: Warm and dry, no rashes. Cardiac: Regular rate and rhythm, no murmurs rubs or gallops, no lower extremity edema.  Respiratory: Clear to auscultation bilaterally. Not using accessory muscles, speaking in full sentences.  Impression and Recommendations:    Diabetes mellitus, type 2 Beautifully controlled now, return as needed for this.  Depression Excellent response to Lexapro 10, unfortunately continues to have anorgasmia. Switching to Trintellix, he has failed multiple SSRIs, generics, with the same side effects.  I spent 25 minutes with this patient, greater than 50% was face-to-face time counseling regarding the above diagnoses

## 2016-09-30 NOTE — Assessment & Plan Note (Signed)
Beautifully controlled now, return as needed for this.

## 2016-10-28 ENCOUNTER — Ambulatory Visit (INDEPENDENT_AMBULATORY_CARE_PROVIDER_SITE_OTHER): Payer: Managed Care, Other (non HMO) | Admitting: Sports Medicine

## 2016-10-28 DIAGNOSIS — F32A Depression, unspecified: Secondary | ICD-10-CM

## 2016-10-28 DIAGNOSIS — F329 Major depressive disorder, single episode, unspecified: Secondary | ICD-10-CM | POA: Diagnosis not present

## 2016-10-28 MED ORDER — VORTIOXETINE HBR 5 MG PO TABS: 1.0000 | ORAL_TABLET | Freq: Every day | ORAL | 3 refills | Status: DC

## 2016-10-28 NOTE — Progress Notes (Signed)
  Subjective:    CC: Follow-up  HPI: Robert Hines returns, he had some anorgasmia with Lexapro, we switched to Trintellix and he has had a fantastic response on 5 mg with even better efficacy.  Past medical history:  Negative.  See flowsheet/record as well for more information.  Surgical history: Negative.  See flowsheet/record as well for more information.  Family history: Negative.  See flowsheet/record as well for more information.  Social history: Negative.  See flowsheet/record as well for more information.  Allergies, and medications have been entered into the medical record, reviewed, and no changes needed.   Review of Systems: No fevers, chills, night sweats, weight loss, chest pain, or shortness of breath.   Objective:    General: Well Developed, well nourished, and in no acute distress.  Neuro: Alert and oriented x3, extra-ocular muscles intact, sensation grossly intact.  HEENT: Normocephalic, atraumatic, pupils equal round reactive to light, neck supple, no masses, no lymphadenopathy, thyroid nonpalpable.  Skin: Warm and dry, no rashes. Cardiac: Regular rate and rhythm, no murmurs rubs or gallops, no lower extremity edema.  Respiratory: Clear to auscultation bilaterally. Not using accessory muscles, speaking in full sentences.  Impression and Recommendations:    Depression Did get some anorgasmia with Lexapro, this is now resolved with Trintellix with even better efficacy for his depression. Return to see me as needed, calling in a 90 day supply.

## 2016-10-28 NOTE — Assessment & Plan Note (Signed)
Did get some anorgasmia with Lexapro, this is now resolved with Trintellix with even better efficacy for his depression. Return to see me as needed, calling in a 90 day supply.

## 2016-11-17 ENCOUNTER — Telehealth: Payer: Self-pay | Admitting: *Deleted

## 2016-11-17 NOTE — Telephone Encounter (Signed)
Pre Authorization sent to cover my meds. [id number]

## 2016-12-27 ENCOUNTER — Other Ambulatory Visit: Payer: Self-pay | Admitting: Sports Medicine

## 2016-12-27 MED ORDER — ZOLPIDEM TARTRATE 10 MG PO TABS: 10.0000 mg | ORAL_TABLET | Freq: Every evening | ORAL | 0 refills | Status: DC | PRN

## 2017-01-05 ENCOUNTER — Ambulatory Visit: Payer: Managed Care, Other (non HMO)

## 2017-01-05 ENCOUNTER — Encounter: Payer: Self-pay | Admitting: Sports Medicine

## 2017-01-05 ENCOUNTER — Ambulatory Visit (INDEPENDENT_AMBULATORY_CARE_PROVIDER_SITE_OTHER): Payer: Managed Care, Other (non HMO) | Admitting: Sports Medicine

## 2017-01-05 DIAGNOSIS — I82611 Acute embolism and thrombosis of superficial veins of right upper extremity: Secondary | ICD-10-CM | POA: Insufficient documentation

## 2017-01-05 DIAGNOSIS — M25512 Pain in left shoulder: Secondary | ICD-10-CM | POA: Insufficient documentation

## 2017-01-05 MED ORDER — CEPHALEXIN 500 MG PO CAPS: 500.0000 mg | ORAL_CAPSULE | Freq: Two times a day (BID) | ORAL | 0 refills | Status: DC

## 2017-01-05 NOTE — Assessment & Plan Note (Signed)
Suspect he will pain versus cephalic vein superficial thrombosis. Keflex, ibuprofen 800 mg 3 times a day, warm compresses. Return to see me in 2 weeks.

## 2017-01-05 NOTE — Progress Notes (Signed)
  Subjective:    CC: Right arm pain  HPI: For the past several weeks this pleasant 61 year old male has noted a mildly tender, palpable cord in his right anterior arm over the elbow. Minimal swelling distally, no redness, no fevers or chills, no recent blood work, no recent IV placement. Symptoms are moderate, persistent.   He also has a new onset severe left shoulder pain over the deltoid, worse with external rotation, abduction, as well as overhead activities.  Past medical history:  Negative.  See flowsheet/record as well for more information.  Surgical history: Negative.  See flowsheet/record as well for more information.  Family history: Negative.  See flowsheet/record as well for more information.  Social history: Negative.  See flowsheet/record as well for more information.  Allergies, and medications have been entered into the medical record, reviewed, and no changes needed.   Review of Systems: No fevers, chills, night sweats, weight loss, chest pain, or shortness of breath.   Objective:    General: Well Developed, well nourished, and in no acute distress.  Neuro: Alert and oriented x3, extra-ocular muscles intact, sensation grossly intact.  HEENT: Normocephalic, atraumatic, pupils equal round reactive to light, neck supple, no masses, no lymphadenopathy, thyroid nonpalpable.  Skin: Warm and dry, no rashes. Cardiac: Regular rate and rhythm, no murmurs rubs or gallops, no lower extremity edema.  Respiratory: Clear to auscultation bilaterally. Not using accessory muscles, speaking in full sentences. Right arm: There is a palpable cord, minimally tender that likely represents the right cephalic vein versus cubital vein. Left Shoulder: Inspection reveals no abnormalities, atrophy or asymmetry. Palpation is normal with no tenderness over AC joint or bicipital groove. ROM is full in all planes. Rotator cuff strength normal throughout. Positive Neer and Hawkin's tests, empty  can. Speeds and Yergason's tests normal. Positive Obrien's, negative crank, negative clunk, and good stability. Normal scapular function observed. No painful arc and no drop arm sign. No apprehension sign  Impression and Recommendations:    Superficial venous thrombosis of right arm Suspect he will pain versus cephalic vein superficial thrombosis. Keflex, ibuprofen 800 mg 3 times a day, warm compresses. Return to see me in 2 weeks.  Left shoulder pain Has severe impingement and joint related symptoms. He is going to do ibuprofen 800 mg for his venous thrombosis, we will touch base again in 2 weeks to see if the shoulder improves, if not happy to do an injection.  I spent 25 minutes with this patient, greater than 50% was face-to-face time counseling regarding the above diagnoses

## 2017-01-05 NOTE — Patient Instructions (Signed)
Phlebitis Phlebitis is soreness and swelling (inflammation) of a vein. This can occur in your arms, legs, or torso (trunk), as well as deeper inside your body. Phlebitis is usually not serious when it occurs close to the surface of the body. However, it can cause serious problems when it occurs in a vein deeper inside the body. What are the causes? Phlebitis can be triggered by various things, including:  Reduced blood flow through your veins. This can happen with: ? Bed rest over a long period. ? Long-distance travel. ? Injury. ? Surgery. ? Being overweight (obese) or pregnant.  Having an IV tube put in the vein and getting certain medicines through the vein.  Cancer and cancer treatment.  Use of illegal drugs taken through the vein.  Inflammatory diseases.  Inherited (genetic) diseases that increase the risk of blood clots.  Hormone therapy, such as birth control pills.  What are the signs or symptoms?  Red, tender, swollen, and painful area on your skin. Usually, the area will be long and narrow.  Firmness along the center of the affected area. This can indicate that a blood clot has formed.  Low-grade fever. How is this diagnosed? A health care provider can usually diagnose phlebitis by examining the affected area and asking about your symptoms. To check for infection or blood clots, your health care provider may order blood tests or an ultrasound exam of the area. Blood tests and your family history may also indicate if you have an underlying genetic disease that causes blood clots. Occasionally, a piece of tissue is taken from the body (biopsy sample) if an unusual cause of phlebitis is suspected. How is this treated? Treatment will vary depending on the severity of the condition and the area of the body affected. Treatment may include:  Use of a warm compress or heating pad.  Use of compression stockings or bandages.  Anti-inflammatory medicines.  Removal of any IV  tube that may be causing the problem.  Medicines that kill germs (antibiotics) if an infection is present.  Blood-thinning medicines if a blood clot is suspected or present.  In rare cases, surgery may be needed to remove damaged sections of vein.  Follow these instructions at home:  Only take over-the-counter or prescription medicines as directed by your health care provider. Take all medicines exactly as prescribed.  Raise (elevate) the affected area above the level of your heart as directed by your health care provider.  Apply a warm compress or heating pad to the affected area as directed by your health care provider. Do not sleep with the heating pad.  Use compression stockings or bandages as directed. These will speed healing and prevent the condition from coming back.  If you are on blood thinners: ? Get follow-up blood tests as directed by your health care provider. ? Check with your health care provider before using any new medicines. ? Carry a medical alert card or wear your medical alert jewelry to show that you are on blood thinners.  For phlebitis in the legs: ? Avoid prolonged standing or bed rest. ? Keep your legs moving. Raise your legs when sitting or lying.  Do not smoke.  Women, particularly those over the age of 35, should consider the risks and benefits of taking the contraceptive pill. This kind of hormone treatment can increase your risk for blood clots.  Follow up with your health care provider as directed. Contact a health care provider if:  You have unusual bruising or any   bleeding problems.  Your swelling or pain in the affected area is not improving.  You are on anti-inflammatory medicine, and you develop belly (abdominal) pain. Get help right away if:  You have a sudden onset of chest pain or difficulty breathing.  You have a fever or persistent symptoms for more than 2-3 days.  You have a fever and your symptoms suddenly get worse. This  information is not intended to replace advice given to you by your health care provider. Make sure you discuss any questions you have with your health care provider. Document Released: 05/31/2001 Document Revised: 11/12/2015 Document Reviewed: 02/11/2013 Elsevier Interactive Patient Education  2017 Elsevier Inc.  

## 2017-01-05 NOTE — Assessment & Plan Note (Signed)
Has severe impingement and joint related symptoms. He is going to do ibuprofen 800 mg for his venous thrombosis, we will touch base again in 2 weeks to see if the shoulder improves, if not happy to do an injection.

## 2017-01-26 ENCOUNTER — Other Ambulatory Visit: Payer: Self-pay | Admitting: Sports Medicine

## 2017-01-26 DIAGNOSIS — F329 Major depressive disorder, single episode, unspecified: Secondary | ICD-10-CM

## 2017-01-26 DIAGNOSIS — F32A Depression, unspecified: Secondary | ICD-10-CM

## 2017-03-07 ENCOUNTER — Other Ambulatory Visit: Payer: Self-pay | Admitting: Sports Medicine

## 2017-03-09 ENCOUNTER — Other Ambulatory Visit: Payer: Self-pay | Admitting: Sports Medicine

## 2017-03-09 DIAGNOSIS — I10 Essential (primary) hypertension: Secondary | ICD-10-CM

## 2017-03-09 MED ORDER — LISINOPRIL 20 MG PO TABS: 20.0000 mg | ORAL_TABLET | Freq: Every day | ORAL | 3 refills | Status: DC

## 2017-04-17 ENCOUNTER — Other Ambulatory Visit: Payer: Self-pay | Admitting: Sports Medicine

## 2017-07-06 ENCOUNTER — Other Ambulatory Visit: Payer: Self-pay | Admitting: Sports Medicine

## 2017-07-06 DIAGNOSIS — I10 Essential (primary) hypertension: Secondary | ICD-10-CM

## 2017-07-23 ENCOUNTER — Other Ambulatory Visit: Payer: Self-pay | Admitting: Sports Medicine

## 2017-07-23 DIAGNOSIS — Z794 Long term (current) use of insulin: Principal | ICD-10-CM

## 2017-07-23 DIAGNOSIS — E119 Type 2 diabetes mellitus without complications: Secondary | ICD-10-CM

## 2017-08-14 ENCOUNTER — Telehealth: Payer: Self-pay | Admitting: Sports Medicine

## 2017-08-14 NOTE — Telephone Encounter (Signed)
Recuieved fax from [employer] that [employer] was approved from 08/10/2017 until 08/10/2018. Forms sent to scan.  Reference: [id number],CMA.

## 2017-08-18 ENCOUNTER — Ambulatory Visit (INDEPENDENT_AMBULATORY_CARE_PROVIDER_SITE_OTHER): Payer: 59

## 2017-08-18 ENCOUNTER — Ambulatory Visit: Payer: Managed Care, Other (non HMO) | Admitting: Sports Medicine

## 2017-08-18 DIAGNOSIS — M25512 Pain in left shoulder: Secondary | ICD-10-CM

## 2017-08-18 NOTE — Assessment & Plan Note (Signed)
Severe recurrence of pain, he has both impingement and glenohumeral symptoms. He did have a superficial venous thrombosis a year ago that has resolved with conservative measures. Glenohumeral and subacromial injections, baseline x-rays, formal physical therapy. Return in 6 weeks. He is also having some left periscapular cervical radicular symptoms which we can recheck after treatment of his shoulder, this is the classic chicken and the egg of sports medicine.

## 2017-08-18 NOTE — Progress Notes (Signed)
Pt has seen results on MyChart and message also sent for patient to call back if any questions.

## 2017-08-18 NOTE — Progress Notes (Signed)
Subjective:    CC: Left shoulder pain  HPI: This is a pleasant 62 year old male, he has had bilateral shoulder problems for some time now, on the right side he ended up with SLAP repair, arthroscopic subacromial decompression and debridement.    He has had left shoulder pain for some time now as well, localized over the deltoid and worse with overhead activities.  I am keeping him from sleeping, severe, persistent.  Lately he is also reported some pain just medial to the border of the left scapula, worse with neck motion, he is post ACDF.  Nothing overtly radicular down to the hands or fingertips.  I reviewed the past medical history, family history, social history, surgical history, and allergies today and no changes were needed.  Please see the problem list section below in epic for further details.  Past Medical History: Past Medical History:  Diagnosis Date  . Arthritis    back  . Depression   . Diabetes mellitus without complication (HCC)   . Hyperlipidemia   . Hypertension   . Renal disorder    calculi, multiple lithotripsies, stents  . Rotator cuff disorder   . Spinal pain    Past Surgical History: Past Surgical History:  Procedure Laterality Date  . ANTERIOR FUSION CERVICAL SPINE    . FOOT SURGERY    . HERNIA REPAIR    . LITHOTRIPSY    . SHOULDER ARTHROSCOPY WITH ROTATOR CUFF REPAIR AND SUBACROMIAL DECOMPRESSION Right 12/28/2015   Procedure: SHOULDER ARTHROSCOPY TAKE DOWN ROTATOR CUFF REPAIR AND SUBACROMIAL DECOMPRESSION, DEBRIDEMENT OF SLAP TEAR;  Surgeon: Jones Broom, MD;  Location: [facility];  Service: Orthopedics;  Laterality: Right;  SHOULDER ARTHROSCOPY TAKE DOWN ROTATOR CUFF REPAIR AND SUBACROMIAL DECOMPRESSION  . VASECTOMY Bilateral    Social History: Social History   Socioeconomic History  . Marital status: Married    Spouse name: Not on file  . Number of children: Not on file  . Years of education: Not on file  . Highest education  level: Not on file  Social Needs  . Financial resource strain: Not on file  . Food insecurity - worry: Not on file  . Food insecurity - inability: Not on file  . Transportation needs - medical: Not on file  . Transportation needs - non-medical: Not on file  Occupational History  . Not on file  Tobacco Use  . Smoking status: Former Smoker    Last attempt to quit: 10/01/2014    Years since quitting: 2.8  . Smokeless tobacco: Never Used  Substance and Sexual Activity  . Alcohol use: Yes    Alcohol/week: 0.0 oz    Comment: social  . Drug use: No  . Sexual activity: Not on file  Other Topics Concern  . Not on file  Social History Narrative  . Not on file   Family History: Family History  Problem Relation Age of Onset  . Hypertension Mother   . Heart failure Father   . Diabetes Father   . Hypertension Father   . Heart failure Sister   . Hypertension Sister    Allergies: Allergies  Allergen Reactions  . Codeine Nausea Only  . Duloxetine Diarrhea and Nausea Only   Medications: See med rec.  Review of Systems: No fevers, chills, night sweats, weight loss, chest pain, or shortness of breath.   Objective:    General: Well Developed, well nourished, and in no acute distress.  Neuro: Alert and oriented x3, extra-ocular muscles intact, sensation grossly intact.  HEENT: Normocephalic, atraumatic, pupils equal round reactive to light, neck supple, no masses, no lymphadenopathy, thyroid nonpalpable.  Skin: Warm and dry, no rashes. Cardiac: Regular rate and rhythm, no murmurs rubs or gallops, no lower extremity edema.  Respiratory: Clear to auscultation bilaterally. Not using accessory muscles, speaking in full sentences. Left shoulder: Inspection reveals no abnormalities, atrophy or asymmetry. Palpation is normal with no tenderness over AC joint or bicipital groove. ROM is full in all planes. Rotator cuff strength normal throughout. Positive Neer and Hawkin's tests, empty  can. Speeds and Yergason's tests normal. No positive Obrien's, negative crank, negative clunk, and good stability. Normal scapular function observed. No painful arc and no drop arm sign. No apprehension sign  Procedure: Real-time Ultrasound Guided Injection of left subacromial bursa Device: GE Logiq E  Verbal informed consent obtained.  Time-out conducted.  Noted no overlying erythema, induration, or other signs of local infection.  Skin prepped in a sterile fashion.  Local anesthesia: Topical Ethyl chloride.  With sterile technique and under real time ultrasound guidance: 1 cc kenalog 40, 1 cc lidocaine, 1 cc bupivacaine injected easily Completed without difficulty  Pain immediately resolved suggesting accurate placement of the medication.  Advised to call if fevers/chills, erythema, induration, drainage, or persistent bleeding.  Images permanently stored and available for review in the ultrasound unit.  Impression: Technically successful ultrasound guided injection.  Procedure: Real-time Ultrasound Guided Injection of left glenohumeral joint Device: GE Logiq E  Verbal informed consent obtained.  Time-out conducted.  Noted no overlying erythema, induration, or other signs of local infection.  Skin prepped in a sterile fashion.  Local anesthesia: Topical Ethyl chloride.  With sterile technique and under real time ultrasound guidance: Using a posterior approach and taking care to avoid the labrum, I used a 22-gauge spinal needle, entered the joint and injected 1 cc kenalog 40, 2 cc lidocaine, 2 cc bupivacaine. Completed without difficulty  Pain immediately resolved suggesting accurate placement of the medication.  Advised to call if fevers/chills, erythema, induration, drainage, or persistent bleeding.  Images permanently stored and available for review in the ultrasound unit.  Impression: Technically successful ultrasound guided injection.  Impression and Recommendations:    Left  shoulder pain Severe recurrence of pain, he has both impingement and glenohumeral symptoms. He did have a superficial venous thrombosis a year ago that has resolved with conservative measures. Glenohumeral and subacromial injections, baseline x-rays, formal physical therapy. Return in 6 weeks. He is also having some left periscapular cervical radicular symptoms which we can recheck after treatment of his shoulder, this is the classic chicken and the egg of sports medicine.  ___________________________________________ Ihor Austinhomas J. Benjamin Stainhekkekandam, M.D., ABFM., CAQSM. Primary Care and Sports Medicine [facility]  Adjunct Instructor of Family Medicine  [employer]

## 2017-08-25 ENCOUNTER — Ambulatory Visit: Payer: 59 | Admitting: Rehabilitative and Restorative Service Providers"

## 2017-08-25 ENCOUNTER — Encounter: Payer: Self-pay | Admitting: Rehabilitative and Restorative Service Providers"

## 2017-08-25 DIAGNOSIS — G8929 Other chronic pain: Secondary | ICD-10-CM

## 2017-08-25 DIAGNOSIS — R29898 Other symptoms and signs involving the musculoskeletal system: Secondary | ICD-10-CM | POA: Diagnosis not present

## 2017-08-25 DIAGNOSIS — R293 Abnormal posture: Secondary | ICD-10-CM

## 2017-08-25 DIAGNOSIS — M25512 Pain in left shoulder: Secondary | ICD-10-CM | POA: Diagnosis not present

## 2017-08-25 DIAGNOSIS — M6281 Muscle weakness (generalized): Secondary | ICD-10-CM | POA: Diagnosis not present

## 2017-08-25 NOTE — Patient Instructions (Signed)
   Self massage using a out a 4 inch plastic ball   Pulley forward 10 sec hold x 10 reps  Jumping jack motion (forward a little) 10 sec hold x 10 reps  Hand behind back pull up back 10 sec x 5   Axial Extension (Chin Tuck)    Pull chin in and lengthen back of neck. Hold __5__ seconds while counting out loud. Repeat __10__ times. Do __several__ sessions per day.  Shoulder Blade Squeeze    Rotate shoulders back, then squeeze shoulder blades down and back Hold 10 sec Repeat __10__ times. Do __several __ sessions per day.  Upper Back Strength: Lower Trapezius / Rotator Cuff " L's "     Arms in waitress pose, palms up. Press hands back and slide shoulder blades down. Hold for __5__ seconds. Repeat _10___ times. 1-2 times per day.    Scapular Retraction: Elbow Flexion (Standing)  "W's"     With elbows bent to 90, pinch shoulder blades together and rotate arms out, keeping elbows bent. Repeat __10__ times per set. Do __1-2__ sets per session. Do _several ___ sessions per day.   Scapula Adduction With Pectoralis Stretch: Low - Standing   Shoulders at 45 hands even with shoulders, keeping weight through legs, shift weight forward until you feel pull or stretch through the front of your chest. Hold _30__ seconds. Do _3__ times, _2-4__ times per day.   Scapula Adduction With Pectoralis Stretch: Mid-Range - Standing   Shoulders at 90 elbows even with shoulders, keeping weight through legs, shift weight forward until you feel pull or strength through the front of your chest. Hold __30_ seconds. Do _3__ times, __2-4_ times per day.   Scapula Adduction With Pectoralis Stretch: High - Standing   Shoulders at 120 hands up high on the doorway, keeping weight on feet, shift weight forward until you feel pull or stretch through the front of your chest. Hold _30__ seconds. Do _3__ times, _2-3__ times per day.  ELBOW: Biceps - Standing at counter     Standing in doorway,  place one hand on wall, elbow straight. Lean forward. Hold _30__ seconds. __3_ reps per set 2-3 times/day   Resisted External Rotation: in Neutral - Bilateral   PALMS UP Sit or stand, tubing in both hands, elbows at sides, bent to 90, forearms forward. Pinch shoulder blades together and rotate forearms out. Keep elbows at sides. Repeat __10__ times per set. Do _2-3___ sets per session. Do _1_ sessions per day.   Low Row: Standing   Face anchor, feet shoulder width apart. Palms up, pull arms back, squeezing shoulder blades together. Repeat 10__ times per set. Do 2-3__ sets per session. Do 1__ sessions per day     Strengthening: Resisted Extension   Hold tubing in right hand, arm forward. Pull arm back, elbow straight. Repeat _10___ times per set. Do 2-3____ sets per session. Do 1___ sessions per day.    [facility] [address] [place], [place] [address]  [phone] (office) [phone] (fax)

## 2017-08-25 NOTE — Therapy (Addendum)
[facility] [place], [place], [address] Phone: [phone]   Fax:  [phone]  Physical Therapy Evaluation  Patient Details  Name: Robert Hines: [id number] Date of Birth: 08-Jun-1956 Referring Provider: Dr Dianah Field    Encounter Date: 08/25/2017  PT End of Session - 08/25/17 1435    Visit Number  1    Number of Visits  6    Date for PT Re-Evaluation  10/06/17    PT Start Time  1418    PT Stop Time  1512    PT Time Calculation (min)  54 min    Activity Tolerance  Patient tolerated treatment well       Past Medical History:  Diagnosis Date  . Arthritis    back  . Depression   . Diabetes mellitus without complication ([place])   . Hyperlipidemia   . Hypertension   . Renal disorder    calculi, multiple lithotripsies, stents  . Rotator cuff disorder   . Spinal pain     Past Surgical History:  Procedure Laterality Date  . ANTERIOR FUSION CERVICAL SPINE    . FOOT SURGERY    . HERNIA REPAIR    . LITHOTRIPSY    . SHOULDER ARTHROSCOPY WITH ROTATOR CUFF REPAIR AND SUBACROMIAL DECOMPRESSION Right 12/28/2015   Procedure: SHOULDER ARTHROSCOPY TAKE DOWN ROTATOR CUFF REPAIR AND SUBACROMIAL DECOMPRESSION, DEBRIDEMENT OF SLAP TEAR;  Surgeon: Tania Ade, MD;  Location: [facility];  Service: Orthopedics;  Laterality: Right;  SHOULDER ARTHROSCOPY TAKE DOWN ROTATOR CUFF REPAIR AND SUBACROMIAL DECOMPRESSION  . VASECTOMY Bilateral     There were no vitals filed for this visit.   Subjective Assessment - 08/25/17 1352    Subjective  Patient reports Lt shoulder pain over the past 6 months with no known injury. He noticed sharp pain in the Lt neck/back pain about 2 weeks. He recieved 2 injections Lt shoulder ~1 weeks ago with good improvement in symptoms. He continues to have pain in the Lt shoulder and neck/mid back area.     Pertinent History  Rt shoulder RCR 2017; cervical disc surgery ~10  years; HTN    Diagnostic tests  xrays     Patient Stated Goals  learn exercises for home to help get rid of shoulder pain     Currently in Pain?  Yes    Pain Score  2     Pain Location  Shoulder    Pain Orientation  Left    Pain Descriptors / Indicators  Dull    Pain Type  Chronic pain    Pain Radiating Towards  was in arm but less so since injection     Pain Onset  More than a month ago    Pain Frequency  Intermittent    Aggravating Factors   reaching up; reaching out or behind back     Pain Relieving Factors  avoiding activities that irritate shoulder; meds; injection; moist heat          [facility] PT Assessment - 08/25/17 0001      Assessment   Medical Diagnosis  Lt shoulder pain     Referring Provider  Dr Dianah Field     Onset Date/Surgical Date  11/18/16    Hand Dominance  Right    Next MD Visit  PRN     Prior Therapy  for Rt shoulder       Precautions   Precautions  None      Balance Screen  Has the patient fallen in the past 6 months  No    Has the patient had a decrease in activity level because of a fear of falling?   No    Is the patient reluctant to leave their home because of a fear of falling?   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     Leisure  yard work; Publishing copy; working outside       Observation/Other Assessments   Focus on [place] (FOTO)   42% limitation       Sensation   Additional Comments  WFL's per pt report       Posture/Postural Control   Posture Comments  head forward; shoudlers rounded and elevated; head of the humerus anterior in orientation; scapulae abducted and rotated along the thoracic wall       AROM   Right Shoulder Extension  62 Degrees    Right Shoulder Flexion  137 Degrees    Right Shoulder ABduction  140 Degrees    Right Shoulder Internal Rotation  43 Degrees    Right Shoulder External Rotation  72 Degrees     Left Shoulder Extension  53 Degrees    Left Shoulder Flexion  119 Degrees    Left Shoulder ABduction  126 Degrees    Left Shoulder Internal Rotation  21 Degrees    Left Shoulder External Rotation  68 Degrees      Strength   Overall Strength Comments  5/5 bilat UE's       Palpation   Spinal mobility  hypomobiliity cervical and thoracic spine with CPA mobs     Palpation comment  tight pecs; upper trap; leveator; deltoid; biceps Lt > Rt              Objective measurements completed on examination: See above findings.      [facility] Adult PT Treatment/Exercise - 08/25/17 0001      Exercises   Exercises  -- see HEP for exercise in clinic today       Moist Heat Therapy   Number Minutes Moist Heat  15 Minutes    Moist Heat Location  Shoulder Lt      Electrical Stimulation   Electrical Stimulation Location  Lt shoulder girdle    Electrical Stimulation Action  IFC    Electrical Stimulation Parameters  to tolerance    Electrical Stimulation Goals  Pain;Tone             PT Education - 08/25/17 1435    Education provided  Yes    Education Details  HEP postural correction     Person(s) Educated  Patient    Methods  Explanation;Demonstration;Tactile cues;Verbal cues;Handout    Comprehension  Verbalized understanding;Returned demonstration;Verbal cues required;Tactile cues required          PT Long Term Goals - 08/25/17 1440      PT LONG TERM GOAL #1   Title  Improve posture and alignment with patient demonstrating posterior shoulder girdle control for UE function 10/06/17    Time  6    Period  Weeks    Status  New      PT LONG TERM GOAL #2   Title  Improve Lt shoulder ROM to equal or greater than Rt shoulder ROM 10/06/17    Time  6    Period  Weeks  Status  New      PT LONG TERM GOAL #3   Title  Lt shoulder strength =/> 5/5 without pain 10/06/17    Time  6    Period  Weeks    Status  New      PT LONG TERM GOAL #4   Title  Independent in advanced HEP  10/06/17    Time  6    Period  Weeks    Status  New      PT LONG TERM GOAL #5   Title  improve FOTO =/< 35% impaired 10/06/17    Time  6    Period  Weeks    Status  New             Plan - 08/25/17 1436    Clinical Impression Statement  Elvert presents with Lt shoulder pain of ~ 6 months duration. He reports good improvement with injections last week. Dariyon continues to demonstrated decreased Lt shoulder ROM; limited function with Lt UE; pain with active movements Lt UE. He has poor posture and alignment; muscular tightness to palpation; poor quality of movement Lt/Rt UE's. He has signs of adhesive capsulitis as well as shoulder dysfunction. Patient will benefit from PT to address problems identified.     Clinical Presentation  Evolving    Clinical Decision Making  Low    Rehab Potential  Good    PT Frequency  1x / week    PT Duration  6 weeks    PT Treatment/Interventions  Patient/family education;ADLs/Self Care Home Management;Cryotherapy;Electrical Stimulation;Iontophoresis 85m/ml Dexamethasone;Moist Heat;Ultrasound;Dry needling;Manual techniques;Neuromuscular re-education;Therapeutic activities;Therapeutic exercise    PT Next Visit Plan  review HEP; progress with ROM and posterior shoulder girdle strengthening; postural correction; manual work Lt shoulder girdle; modalities as indicated     Consulted and Agree with Plan of Care  Patient       Patient will benefit from skilled therapeutic intervention in order to improve the following deficits and impairments:  Postural dysfunction, Improper body mechanics, Pain, Increased fascial restricitons, Increased muscle spasms, Hypomobility, Decreased range of motion, Decreased mobility, Decreased activity tolerance  Visit Diagnosis: Chronic left shoulder pain - Plan: PT plan of care cert/re-cert  Abnormal posture - Plan: PT plan of care cert/re-cert  Other symptoms and signs involving the musculoskeletal system - Plan: PT plan of care  cert/re-cert  Muscle weakness (generalized) - Plan: PT plan of care cert/re-cert     Problem List Patient Active Problem List   Diagnosis Date Noted  . Superficial venous thrombosis of right arm 01/05/2017  . Left shoulder pain 01/05/2017  . Depression 05/02/2016  . Ingrown right big toenail 02/16/2016  . Obesity 01/23/2015  . Right shoulder pain 01/23/2015  . Former smoker 08/28/2014  . Nephrolithiasis 07/25/2014  . Diabetes mellitus, type 2 ([place] 07/25/2014  . Essential hypertension, benign 07/25/2014  . Annual physical exam 07/25/2014  . Hyperlipidemia 07/25/2014    Kenlyn Lose PNilda SimmerPT, MPH  08/25/2017, 2:46 PM  [facility] [place] [address]Phone: [phone]  Fax:  [phone] Name: GJAHZIR STROHMEIERMRN: [id number] of Birth: [birth year]  PHYSICAL THERAPY DISCHARGE SUMMARY  Visits from Start of Care: evaluation only  Current functional level related to goals / functional outcomes: See eval   Remaining deficits: Unknown/unchanged   Education / Equipment: HEP  Plan: Patient agrees to discharge.  Patient goals were not met. Patient is being discharged due to not returning  since the last visit.  ?????      P. Helene Kelp PT, MPH 09/27/17 4:17 PM

## 2017-09-28 ENCOUNTER — Encounter: Payer: Self-pay | Admitting: Sports Medicine

## 2017-09-28 ENCOUNTER — Other Ambulatory Visit: Payer: Self-pay | Admitting: Sports Medicine

## 2017-09-28 ENCOUNTER — Ambulatory Visit (INDEPENDENT_AMBULATORY_CARE_PROVIDER_SITE_OTHER): Payer: 59

## 2017-09-28 ENCOUNTER — Ambulatory Visit: Payer: 59 | Admitting: Sports Medicine

## 2017-09-28 DIAGNOSIS — M25511 Pain in right shoulder: Secondary | ICD-10-CM

## 2017-09-28 DIAGNOSIS — E119 Type 2 diabetes mellitus without complications: Secondary | ICD-10-CM | POA: Diagnosis not present

## 2017-09-28 DIAGNOSIS — Z981 Arthrodesis status: Secondary | ICD-10-CM

## 2017-09-28 DIAGNOSIS — Z794 Long term (current) use of insulin: Secondary | ICD-10-CM

## 2017-09-28 DIAGNOSIS — G8929 Other chronic pain: Secondary | ICD-10-CM

## 2017-09-28 NOTE — Assessment & Plan Note (Signed)
Historically controlled, rechecking labs.

## 2017-09-28 NOTE — Progress Notes (Signed)
Subjective:    CC: Follow-up  HPI: Right shoulder pain: Resolved after glenohumeral and subacromial injections.  Right periscapular pain: History of cervical ACDF, now with a recurrence of pain, particularly with neck flexion.  No trauma, no constitutional symptoms, nothing overtly radicular down the arm with the exception of some periscapular burning.  Diabetes mellitus type II: Due for some routine labs, he has bounced around 90 units of Toujeo, he has had morning blood sugars mostly in the 90s, he had a couple in the 60s.  I reviewed the past medical history, family history, social history, surgical history, and allergies today and no changes were needed.  Please see the problem list section below in epic for further details.  Past Medical History: Past Medical History:  Diagnosis Date  . Arthritis    back  . Depression   . Diabetes mellitus without complication (HCC)   . Hyperlipidemia   . Hypertension   . Renal disorder    calculi, multiple lithotripsies, stents  . Rotator cuff disorder   . Spinal pain    Past Surgical History: Past Surgical History:  Procedure Laterality Date  . ANTERIOR FUSION CERVICAL SPINE    . FOOT SURGERY    . HERNIA REPAIR    . LITHOTRIPSY    . SHOULDER ARTHROSCOPY WITH ROTATOR CUFF REPAIR AND SUBACROMIAL DECOMPRESSION Right 12/28/2015   Procedure: SHOULDER ARTHROSCOPY TAKE DOWN ROTATOR CUFF REPAIR AND SUBACROMIAL DECOMPRESSION, DEBRIDEMENT OF SLAP TEAR;  Surgeon: Jones Broom, MD;  Location: [facility];  Service: Orthopedics;  Laterality: Right;  SHOULDER ARTHROSCOPY TAKE DOWN ROTATOR CUFF REPAIR AND SUBACROMIAL DECOMPRESSION  . VASECTOMY Bilateral    Social History: Social History   Socioeconomic History  . Marital status: Married    Spouse name: Not on file  . Number of children: Not on file  . Years of education: Not on file  . Highest education level: Not on file  Occupational History  . Not on file  Social Needs    . Financial resource strain: Not on file  . Food insecurity:    Worry: Not on file    Inability: Not on file  . Transportation needs:    Medical: Not on file    Non-medical: Not on file  Tobacco Use  . Smoking status: Former Smoker    Last attempt to quit: 10/01/2014    Years since quitting: 2.9  . Smokeless tobacco: Never Used  Substance and Sexual Activity  . Alcohol use: Yes    Alcohol/week: 0.0 oz    Comment: social  . Drug use: No  . Sexual activity: Not on file  Lifestyle  . Physical activity:    Days per week: Not on file    Minutes per session: Not on file  . Stress: Not on file  Relationships  . Social connections:    Talks on phone: Not on file    Gets together: Not on file    Attends religious service: Not on file    Active member of club or organization: Not on file    Attends meetings of clubs or organizations: Not on file    Relationship status: Not on file  Other Topics Concern  . Not on file  Social History Narrative  . Not on file   Family History: Family History  Problem Relation Age of Onset  . Hypertension Mother   . Heart failure Father   . Diabetes Father   . Hypertension Father   . Heart failure Sister   .  Hypertension Sister    Allergies: Allergies  Allergen Reactions  . Codeine Nausea Only  . Duloxetine Diarrhea and Nausea Only   Medications: See med rec.  Review of Systems: No fevers, chills, night sweats, weight loss, chest pain, or shortness of breath.   Objective:    General: Well Developed, well nourished, and in no acute distress.  Neuro: Alert and oriented x3, extra-ocular muscles intact, sensation grossly intact.  HEENT: Normocephalic, atraumatic, pupils equal round reactive to light, neck supple, no masses, no lymphadenopathy, thyroid nonpalpable.  Skin: Warm and dry, no rashes. Cardiac: Regular rate and rhythm, no murmurs rubs or gallops, no lower extremity edema.  Respiratory: Clear to auscultation bilaterally. Not  using accessory muscles, speaking in full sentences.  Impression and Recommendations:    Diabetes mellitus, type 2 Historically controlled, rechecking labs.  Right shoulder pain Multifactorial but nearly pain-free now after subacromial and glenohumeral injections at the last visit.  S/P cervical spinal fusion With some right periscapular radicular symptoms, likely representing adjacent level disease. Adding neck rehab exercises, as well as cervical spine x-rays to evaluate for loosening of the components. Return to see me if no better in 1 month and we will proceed with MRI and epidural. ___________________________________________ Ihor Austinhomas J. Benjamin Stainhekkekandam, M.D., ABFM., CAQSM. Primary Care and Sports Medicine [facility]  Adjunct Instructor of Family Medicine  [employer]

## 2017-09-28 NOTE — Assessment & Plan Note (Signed)
Multifactorial but nearly pain-free now after subacromial and glenohumeral injections at the last visit.

## 2017-09-28 NOTE — Assessment & Plan Note (Signed)
With some right periscapular radicular symptoms, likely representing adjacent level disease. Adding neck rehab exercises, as well as cervical spine x-rays to evaluate for loosening of the components. Return to see me if no better in 1 month and we will proceed with MRI and epidural.

## 2017-09-30 LAB — CBC
HCT: 45.7 % (ref 38.5–50.0)
Hemoglobin: 15.7 g/dL (ref 13.2–17.1)
MCH: 33.1 pg — ABNORMAL HIGH (ref 27.0–33.0)
MCHC: 34.4 g/dL (ref 32.0–36.0)
MCV: 96.4 fL (ref 80.0–100.0)
MPV: 9.9 fL (ref 7.5–12.5)
Platelets: 179 10*3/uL (ref 140–400)
RBC: 4.74 10*6/uL (ref 4.20–5.80)
RDW: 13.7 % (ref 11.0–15.0)
WBC: 6.8 Thousand/uL (ref 3.8–10.8)

## 2017-09-30 LAB — TSH: TSH: 2.24 m[IU]/L (ref 0.40–4.50)

## 2017-09-30 LAB — COMPREHENSIVE METABOLIC PANEL WITH GFR
ALT: 21 U/L (ref 9–46)
AST: 16 U/L (ref 10–35)
CO2: 26 mmol/L (ref 20–32)
Creat: 1.01 mg/dL (ref 0.70–1.25)
Glucose, Bld: 129 mg/dL (ref 65–139)
Total Protein: 6.5 g/dL (ref 6.1–8.1)

## 2017-09-30 LAB — COMPREHENSIVE METABOLIC PANEL
AG Ratio: 1.8 (calc) (ref 1.0–2.5)
Albumin: 4.2 g/dL (ref 3.6–5.1)
Alkaline phosphatase (APISO): 77 U/L (ref 40–115)
BUN: 15 mg/dL (ref 7–25)
Calcium: 9 mg/dL (ref 8.6–10.3)
Chloride: 104 mmol/L (ref 98–110)
Globulin: 2.3 g/dL (calc) (ref 1.9–3.7)
Potassium: 4.3 mmol/L (ref 3.5–5.3)
Sodium: 137 mmol/L (ref 135–146)
Total Bilirubin: 0.5 mg/dL (ref 0.2–1.2)

## 2017-09-30 LAB — LIPID PANEL W/REFLEX DIRECT LDL
Cholesterol: 143 mg/dL (ref <200)
HDL: 42 mg/dL (ref >40)
LDL Cholesterol (Calc): 73 mg/dL (calc)
Non-HDL Cholesterol (Calc): 101 mg/dL (calc) (ref <130)
Total CHOL/HDL Ratio: 3.4 (calc) (ref <5.0)
Triglycerides: 183 mg/dL — ABNORMAL HIGH (ref <150)

## 2017-09-30 LAB — HEMOGLOBIN A1C
Hgb A1c MFr Bld: 8 % of total Hgb — ABNORMAL HIGH (ref <5.7)
Mean Plasma Glucose: 183 (calc)
eAG (mmol/L): 10.1 (calc)

## 2017-10-30 ENCOUNTER — Encounter: Payer: Self-pay | Admitting: Sports Medicine

## 2017-10-30 ENCOUNTER — Ambulatory Visit (INDEPENDENT_AMBULATORY_CARE_PROVIDER_SITE_OTHER): Payer: 59 | Admitting: Sports Medicine

## 2017-10-30 VITALS — BP 133/81 | HR 98 | Resp 18 | Wt 237.0 lb

## 2017-10-30 DIAGNOSIS — F5101 Primary insomnia: Secondary | ICD-10-CM

## 2017-10-30 DIAGNOSIS — M25512 Pain in left shoulder: Secondary | ICD-10-CM | POA: Diagnosis not present

## 2017-10-30 DIAGNOSIS — E119 Type 2 diabetes mellitus without complications: Secondary | ICD-10-CM

## 2017-10-30 DIAGNOSIS — Z794 Long term (current) use of insulin: Secondary | ICD-10-CM

## 2017-10-30 MED ORDER — ZOLPIDEM TARTRATE 10 MG PO TABS: 10.0000 mg | ORAL_TABLET | Freq: Every evening | ORAL | 0 refills | Status: DC | PRN

## 2017-10-30 NOTE — Addendum Note (Signed)
Addended by: Monica Becton on: 10/30/2017 04:50 PM   Modules accepted: Orders

## 2017-10-30 NOTE — Progress Notes (Addendum)
Subjective:    CC: Follow-up  HPI: Left shoulder pain: Improved but still hurting.  Injection, subacromial and glenohumeral was 2-1/2 months ago.  Diabetes mellitus type 2: Due for ophthalmology exam and diabetic foot exam.  Insomnia: Controlled, needs a refill on Ambien.  I reviewed the past medical history, family history, social history, surgical history, and allergies today and no changes were needed.  Please see the problem list section below in epic for further details.  Past Medical History: Past Medical History:  Diagnosis Date  . Arthritis    back  . Depression   . Diabetes mellitus without complication (HCC)   . Hyperlipidemia   . Hypertension   . Renal disorder    calculi, multiple lithotripsies, stents  . Rotator cuff disorder   . Spinal pain    Past Surgical History: Past Surgical History:  Procedure Laterality Date  . ANTERIOR FUSION CERVICAL SPINE    . FOOT SURGERY    . HERNIA REPAIR    . LITHOTRIPSY    . SHOULDER ARTHROSCOPY WITH ROTATOR CUFF REPAIR AND SUBACROMIAL DECOMPRESSION Right 12/28/2015   Procedure: SHOULDER ARTHROSCOPY TAKE DOWN ROTATOR CUFF REPAIR AND SUBACROMIAL DECOMPRESSION, DEBRIDEMENT OF SLAP TEAR;  Surgeon: Jones Broom, MD;  Location: [facility];  Service: Orthopedics;  Laterality: Right;  SHOULDER ARTHROSCOPY TAKE DOWN ROTATOR CUFF REPAIR AND SUBACROMIAL DECOMPRESSION  . VASECTOMY Bilateral    Social History: Social History   Socioeconomic History  . Marital status: Married    Spouse name: Not on file  . Number of children: Not on file  . Years of education: Not on file  . Highest education level: Not on file  Occupational History  . Not on file  Social Needs  . Financial resource strain: Not on file  . Food insecurity:    Worry: Not on file    Inability: Not on file  . Transportation needs:    Medical: Not on file    Non-medical: Not on file  Tobacco Use  . Smoking status: Former Smoker    Last attempt  to quit: 10/01/2014    Years since quitting: 3.0  . Smokeless tobacco: Never Used  Substance and Sexual Activity  . Alcohol use: Yes    Alcohol/week: 0.0 oz    Comment: social  . Drug use: No  . Sexual activity: Not on file  Lifestyle  . Physical activity:    Days per week: Not on file    Minutes per session: Not on file  . Stress: Not on file  Relationships  . Social connections:    Talks on phone: Not on file    Gets together: Not on file    Attends religious service: Not on file    Active member of club or organization: Not on file    Attends meetings of clubs or organizations: Not on file    Relationship status: Not on file  Other Topics Concern  . Not on file  Social History Narrative  . Not on file   Family History: Family History  Problem Relation Age of Onset  . Hypertension Mother   . Heart failure Father   . Diabetes Father   . Hypertension Father   . Heart failure Sister   . Hypertension Sister    Allergies: Allergies  Allergen Reactions  . Codeine Nausea Only  . Duloxetine Diarrhea and Nausea Only   Medications: See med rec.  Review of Systems: No fevers, chills, night sweats, weight loss, chest pain, or shortness  of breath.   Objective:    General: Well Developed, well nourished, and in no acute distress.  Neuro: Alert and oriented x3, extra-ocular muscles intact, sensation grossly intact.  HEENT: Normocephalic, atraumatic, pupils equal round reactive to light, neck supple, no masses, no lymphadenopathy, thyroid nonpalpable.  Skin: Warm and dry, no rashes. Cardiac: Regular rate and rhythm, no murmurs rubs or gallops, no lower extremity edema.  Respiratory: Clear to auscultation bilaterally. Not using accessory muscles, speaking in full sentences. Left shoulder:  Inspection reveals no abnormalities, atrophy or asymmetry. Palpation is normal with no tenderness over AC joint or bicipital groove. ROM is full in all planes. Rotator cuff strength  normal throughout. Positive Neer and Hawkin's tests, empty can. Speeds and Yergason's tests normal. No labral pathology noted with negative Obrien's, negative crank, negative clunk, and good stability. Normal scapular function observed. No painful arc and no drop arm sign. No apprehension sign  Impression and Recommendations:    Left shoulder pain Subacromial and glenohumeral injections were done in early March. Overall he is doing okay, pain is livable but mostly impingement related. He does have 2 more weeks until he had 3 months, he can return for injection at that time if needed. Neck pain has improved considerably.  Diabetes mellitus, type 2 A1c was 8%, he is going to make an appointment with his ophthalmologist and foot exam done today.  Primary insomnia Refilling Ambien.  I spent 25 minutes with this patient, greater than 50% was face-to-face time counseling regarding the above diagnoses ___________________________________________ Robert Hines. Benjamin Stain, M.D., ABFM., CAQSM. Primary Care and Sports Medicine [facility]  Adjunct Instructor of Family Medicine  [employer]

## 2017-10-30 NOTE — Assessment & Plan Note (Signed)
Subacromial and glenohumeral injections were done in early March. Overall he is doing okay, pain is livable but mostly impingement related. He does have 2 more weeks until he had 3 months, he can return for injection at that time if needed. Neck pain has improved considerably.

## 2017-10-30 NOTE — Assessment & Plan Note (Addendum)
Refilling Ambien. ?

## 2017-10-30 NOTE — Assessment & Plan Note (Signed)
A1c was 8%, he is going to make an appointment with his ophthalmologist and foot exam done today.

## 2017-11-22 ENCOUNTER — Other Ambulatory Visit: Payer: Self-pay | Admitting: Sports Medicine

## 2017-11-22 DIAGNOSIS — F329 Major depressive disorder, single episode, unspecified: Secondary | ICD-10-CM

## 2017-11-22 DIAGNOSIS — F32A Depression, unspecified: Secondary | ICD-10-CM

## 2017-12-24 ENCOUNTER — Other Ambulatory Visit: Payer: Self-pay | Admitting: Sports Medicine

## 2017-12-24 DIAGNOSIS — Z794 Long term (current) use of insulin: Principal | ICD-10-CM

## 2017-12-24 DIAGNOSIS — E119 Type 2 diabetes mellitus without complications: Secondary | ICD-10-CM

## 2018-02-21 ENCOUNTER — Other Ambulatory Visit: Payer: Self-pay | Admitting: Sports Medicine

## 2018-03-14 ENCOUNTER — Other Ambulatory Visit: Payer: Self-pay | Admitting: Sports Medicine

## 2018-03-14 DIAGNOSIS — Z794 Long term (current) use of insulin: Principal | ICD-10-CM

## 2018-03-14 DIAGNOSIS — E119 Type 2 diabetes mellitus without complications: Secondary | ICD-10-CM

## 2018-04-26 ENCOUNTER — Ambulatory Visit (INDEPENDENT_AMBULATORY_CARE_PROVIDER_SITE_OTHER): Payer: 59 | Admitting: Sports Medicine

## 2018-04-26 ENCOUNTER — Encounter: Payer: Self-pay | Admitting: Sports Medicine

## 2018-04-26 DIAGNOSIS — M25512 Pain in left shoulder: Secondary | ICD-10-CM

## 2018-04-26 DIAGNOSIS — B001 Herpesviral vesicular dermatitis: Secondary | ICD-10-CM | POA: Insufficient documentation

## 2018-04-26 DIAGNOSIS — E119 Type 2 diabetes mellitus without complications: Secondary | ICD-10-CM

## 2018-04-26 DIAGNOSIS — M25511 Pain in right shoulder: Secondary | ICD-10-CM

## 2018-04-26 DIAGNOSIS — G8929 Other chronic pain: Secondary | ICD-10-CM

## 2018-04-26 DIAGNOSIS — F329 Major depressive disorder, single episode, unspecified: Secondary | ICD-10-CM | POA: Diagnosis not present

## 2018-04-26 DIAGNOSIS — F5101 Primary insomnia: Secondary | ICD-10-CM

## 2018-04-26 DIAGNOSIS — Z794 Long term (current) use of insulin: Secondary | ICD-10-CM

## 2018-04-26 DIAGNOSIS — F32A Depression, unspecified: Secondary | ICD-10-CM

## 2018-04-26 MED ORDER — VALACYCLOVIR HCL 1 G PO TABS: 1000.0000 mg | ORAL_TABLET | Freq: Two times a day (BID) | ORAL | 2 refills | Status: DC

## 2018-04-26 MED ORDER — ZOLPIDEM TARTRATE 10 MG PO TABS: 10.0000 mg | ORAL_TABLET | Freq: Every evening | ORAL | 0 refills | Status: DC | PRN

## 2018-04-26 MED ORDER — ESCITALOPRAM OXALATE 10 MG PO TABS: 10.0000 mg | ORAL_TABLET | Freq: Every day | ORAL | 3 refills | Status: DC

## 2018-04-26 MED ORDER — TRAMADOL HCL 50 MG PO TABS: 50.0000 mg | ORAL_TABLET | Freq: Every day | ORAL | 0 refills | Status: DC

## 2018-04-26 NOTE — Progress Notes (Signed)
Subjective:    CC: Left shoulder pain  HPI: This is a pleasant 62 year old male, we have treated him for adhesive capsulitis in the past, and he had a good response.  Now having recurrence of pain in the left shoulder, moderate, persistent, localized the joint line without radiation, progress of loss of range of motion.  Insomnia: Needs refill on Ambien.    Cold sore: Would like some Valtrex.  Depression: Now in the donut hole, Trintellix is too expensive, would like to switch back to Lexapro.  I reviewed the past medical history, family history, social history, surgical history, and allergies today and no changes were needed.  Please see the problem list section below in epic for further details.  Past Medical History: Past Medical History:  Diagnosis Date  . Arthritis    back  . Depression   . Diabetes mellitus without complication (HCC)   . Hyperlipidemia   . Hypertension   . Renal disorder    calculi, multiple lithotripsies, stents  . Rotator cuff disorder   . Spinal pain    Past Surgical History: Past Surgical History:  Procedure Laterality Date  . ANTERIOR FUSION CERVICAL SPINE    . FOOT SURGERY    . HERNIA REPAIR    . LITHOTRIPSY    . SHOULDER ARTHROSCOPY WITH ROTATOR CUFF REPAIR AND SUBACROMIAL DECOMPRESSION Right 12/28/2015   Procedure: SHOULDER ARTHROSCOPY TAKE DOWN ROTATOR CUFF REPAIR AND SUBACROMIAL DECOMPRESSION, DEBRIDEMENT OF SLAP TEAR;  Surgeon: Jones Broom, MD;  Location: [facility];  Service: Orthopedics;  Laterality: Right;  SHOULDER ARTHROSCOPY TAKE DOWN ROTATOR CUFF REPAIR AND SUBACROMIAL DECOMPRESSION  . VASECTOMY Bilateral    Social History: Social History   Socioeconomic History  . Marital status: Married    Spouse name: Not on file  . Number of children: Not on file  . Years of education: Not on file  . Highest education level: Not on file  Occupational History  . Not on file  Social Needs  . Financial resource strain:  Not on file  . Food insecurity:    Worry: Not on file    Inability: Not on file  . Transportation needs:    Medical: Not on file    Non-medical: Not on file  Tobacco Use  . Smoking status: Former Smoker    Last attempt to quit: 10/01/2014    Years since quitting: 3.5  . Smokeless tobacco: Never Used  Substance and Sexual Activity  . Alcohol use: Yes    Alcohol/week: 0.0 standard drinks    Comment: social  . Drug use: No  . Sexual activity: Not on file  Lifestyle  . Physical activity:    Days per week: Not on file    Minutes per session: Not on file  . Stress: Not on file  Relationships  . Social connections:    Talks on phone: Not on file    Gets together: Not on file    Attends religious service: Not on file    Active member of club or organization: Not on file    Attends meetings of clubs or organizations: Not on file    Relationship status: Not on file  Other Topics Concern  . Not on file  Social History Narrative  . Not on file   Family History: Family History  Problem Relation Age of Onset  . Hypertension Mother   . Heart failure Father   . Diabetes Father   . Hypertension Father   . Heart failure Sister   .  Hypertension Sister    Allergies: Allergies  Allergen Reactions  . Codeine Nausea Only  . Duloxetine Diarrhea and Nausea Only   Medications: See med rec.  Review of Systems: No fevers, chills, night sweats, weight loss, chest pain, or shortness of breath.   Objective:    General: Well Developed, well nourished, and in no acute distress.  Neuro: Alert and oriented x3, extra-ocular muscles intact, sensation grossly intact.  HEENT: Normocephalic, atraumatic, pupils equal round reactive to light, neck supple, no masses, no lymphadenopathy, thyroid nonpalpable.  Skin: Warm and dry, no rashes. Cardiac: Regular rate and rhythm, no murmurs rubs or gallops, no lower extremity edema.  Respiratory: Clear to auscultation bilaterally. Not using accessory  muscles, speaking in full sentences. Left shoulder: Inspection reveals no abnormalities, atrophy or asymmetry. Palpation is normal with no tenderness over AC joint or bicipital groove. Loss of range of motion, external rotation to 30 degrees, abduction to 30 degrees.  All with pain at the terminal range. Rotator cuff strength normal throughout. No signs of impingement with negative Neer and Hawkin's tests, empty can. Speeds and Yergason's tests normal. No labral pathology noted with negative Obrien's, negative crank, negative clunk, and good stability. Normal scapular function observed. No painful arc and no drop arm sign. No apprehension sign  Procedure: Real-time Ultrasound Guided Injection of left subacromial bursa Device: GE Logiq E  Verbal informed consent obtained.  Time-out conducted.  Noted no overlying erythema, induration, or other signs of local infection.  Skin prepped in a sterile fashion.  Local anesthesia: Topical Ethyl chloride.  With sterile technique and under real time ultrasound guidance: 1 cc Kenalog 40, 1 cc lidocaine, 1 cc bupivacaine injected easily Completed without difficulty  Pain immediately resolved suggesting accurate placement of the medication.  Advised to call if fevers/chills, erythema, induration, drainage, or persistent bleeding.  Images permanently stored and available for review in the ultrasound unit.  Impression: Technically successful ultrasound guided injection.  Procedure: Real-time Ultrasound Guided Injection of left glenohumeral joint Device: GE Logiq E  Verbal informed consent obtained.  Time-out conducted.  Noted no overlying erythema, induration, or other signs of local infection.  Skin prepped in a sterile fashion.  Local anesthesia: Topical Ethyl chloride.  With sterile technique and under real time ultrasound guidance: 22-gauge spinal needle advanced into the joint, injected 1 cc Kenalog 40, 2 cc lidocaine, 2 cc  bupivacaine. Completed without difficulty  Pain immediately resolved suggesting accurate placement of the medication.  Advised to call if fevers/chills, erythema, induration, drainage, or persistent bleeding.  Images permanently stored and available for review in the ultrasound unit.  Impression: Technically successful ultrasound guided injection.  Impression and Recommendations:    Left shoulder pain Recurrent left subacromial and glenohumeral referred pain. Previous injections were in March of this year, repeat glenohumeral and subacromial injections, he does have some adhesive capsulitis today. Return in a month.  Diabetes mellitus, type 2 Rechecking A1c, lipid panel, CBC, CMP  Depression Switching back to Lexapro, he did get anorgasmia with Lexapro but now he is in the donut hole and cannot afford Trintellix. Restarting Lexapro at 10 mg, we will go back to Trintellix next year.  Primary insomnia Refilling Ambien  Cold sore Valtrex for 7 days.  I spent 40 minutes with this patient, greater than 50% was face-to-face time counseling regarding the above diagnoses, mostly discussing the multiple medical problems.  In discussing multiple medical interventions needed.  This was all separate from the time spent performing the above procedure.  ___________________________________________ Gwen Her. Dianah Field, M.D., ABFM., CAQSM. Primary Care and Sports Medicine [facility]  Adjunct Professor of [employer]

## 2018-04-26 NOTE — Assessment & Plan Note (Signed)
Recurrent left subacromial and glenohumeral referred pain. Previous injections were in March of this year, repeat glenohumeral and subacromial injections, he does have some adhesive capsulitis today. Return in a month.

## 2018-04-26 NOTE — Assessment & Plan Note (Signed)
Rechecking A1c, lipid panel, CBC, CMP

## 2018-04-26 NOTE — Assessment & Plan Note (Signed)
Refilling Ambien. ?

## 2018-04-26 NOTE — Assessment & Plan Note (Signed)
Switching back to Lexapro, he did get anorgasmia with Lexapro but now he is in the donut hole and cannot afford Trintellix. Restarting Lexapro at 10 mg, we will go back to Trintellix next year.

## 2018-04-26 NOTE — Assessment & Plan Note (Signed)
Valtrex for 7 days.

## 2018-04-28 LAB — COMPREHENSIVE METABOLIC PANEL WITH GFR
AG Ratio: 1.9 (calc) (ref 1.0–2.5)
ALT: 28 U/L (ref 9–46)
CO2: 25 mmol/L (ref 20–32)
Calcium: 9.4 mg/dL (ref 8.6–10.3)
Sodium: 138 mmol/L (ref 135–146)
Total Bilirubin: 0.5 mg/dL (ref 0.2–1.2)
Total Protein: 6.7 g/dL (ref 6.1–8.1)

## 2018-04-28 LAB — CBC
HCT: 45.7 % (ref 38.5–50.0)
Hemoglobin: 15.6 g/dL (ref 13.2–17.1)
MCH: 32.8 pg (ref 27.0–33.0)
MCHC: 34.1 g/dL (ref 32.0–36.0)
MCV: 96.2 fL (ref 80.0–100.0)
MPV: 10.2 fL (ref 7.5–12.5)
Platelets: 188 10*3/uL (ref 140–400)
RBC: 4.75 10*6/uL (ref 4.20–5.80)
RDW: 12.8 % (ref 11.0–15.0)
WBC: 7.6 Thousand/uL (ref 3.8–10.8)

## 2018-04-28 LAB — LIPID PANEL W/REFLEX DIRECT LDL
Cholesterol: 152 mg/dL (ref <200)
HDL: 59 mg/dL (ref >40)
LDL Cholesterol (Calc): 80 mg/dL
Non-HDL Cholesterol (Calc): 93 mg/dL (calc) (ref <130)
Total CHOL/HDL Ratio: 2.6 (calc) (ref <5.0)
Triglycerides: 51 mg/dL (ref <150)

## 2018-04-28 LAB — HEMOGLOBIN A1C
Hgb A1c MFr Bld: 7.8 % of total Hgb — ABNORMAL HIGH (ref <5.7)
Mean Plasma Glucose: 177 (calc)
eAG (mmol/L): 9.8 (calc)

## 2018-04-28 LAB — COMPREHENSIVE METABOLIC PANEL
AST: 17 U/L (ref 10–35)
Albumin: 4.4 g/dL (ref 3.6–5.1)
Alkaline phosphatase (APISO): 71 U/L (ref 40–115)
BUN: 23 mg/dL (ref 7–25)
Chloride: 104 mmol/L (ref 98–110)
Creat: 1.01 mg/dL (ref 0.70–1.25)
Globulin: 2.3 g/dL (calc) (ref 1.9–3.7)
Glucose, Bld: 162 mg/dL — ABNORMAL HIGH (ref 65–99)
Potassium: 4.5 mmol/L (ref 3.5–5.3)

## 2018-05-09 ENCOUNTER — Ambulatory Visit: Payer: 59 | Admitting: Physician Assistant

## 2018-05-09 ENCOUNTER — Ambulatory Visit (HOSPITAL_BASED_OUTPATIENT_CLINIC_OR_DEPARTMENT_OTHER)
Admission: RE | Admit: 2018-05-09 | Discharge: 2018-05-09 | Disposition: A | Discharge status: Home / Self Care | Payer: 59 | Source: Ambulatory Visit | Attending: Physician Assistant | Admitting: Physician Assistant

## 2018-05-09 ENCOUNTER — Encounter: Payer: Self-pay | Admitting: Physician Assistant

## 2018-05-09 VITALS — BP 145/91 | HR 87 | Temp 98.0°F | Wt 231.0 lb

## 2018-05-09 DIAGNOSIS — M79605 Pain in left leg: Secondary | ICD-10-CM | POA: Insufficient documentation

## 2018-05-09 DIAGNOSIS — M7989 Other specified soft tissue disorders: Secondary | ICD-10-CM | POA: Insufficient documentation

## 2018-05-09 DIAGNOSIS — R936 Abnormal findings on diagnostic imaging of limbs: Secondary | ICD-10-CM | POA: Insufficient documentation

## 2018-05-09 DIAGNOSIS — Z86718 Personal history of other venous thrombosis and embolism: Secondary | ICD-10-CM | POA: Diagnosis not present

## 2018-05-09 HISTORY — DX: Personal history of other venous thrombosis and embolism: Z86.718

## 2018-05-09 MED ORDER — APIXABAN (ELIQUIS) EDUCATION KIT FOR DVT/PE PATIENTS: PACK | 0 refills | Status: DC

## 2018-05-09 MED ORDER — APIXABAN 5 MG PO TABS: ORAL_TABLET | ORAL | 0 refills | Status: DC

## 2018-05-09 NOTE — Patient Instructions (Addendum)
[facility] for an ultrasound of your left leg today. The treatment for superficial thrombophlebitis is quite different from the treatment for deep vein thrombosis (DVT) If this is just swelling in a superficial vein the treatment will be an anti-inflammatory such as Aleve twice a day for 1 to 2 weeks plus warm compresses overlying the affected area. If this does end up being a DVT then you will need to start anticoagulation with a blood thinner.  I have written you a paper prescription for Eliquis.  Do not start this medication unless you are instructed to do so.  Phlebitis Phlebitis is soreness and puffiness (swelling) in a vein. Follow these instructions at home:  Only take medicine as told by your doctor.  Raise (elevate) the affected limb on a pillow as told by your doctor.  Keep a warm pack on the affected vein as told by your doctor. Do not sleep with a heating pad.  Use special stockings or bandages around the area of the affected vein as told by your doctor. These will speed healing and keep the condition from coming back.  Talk to your doctor about all the medicines you take.  Get follow-up blood tests as told by your doctor.  If the phlebitis is in your legs: ? Avoid standing or resting for long periods. ? Keep your legs moving. Raise your legs when you sit or lie.  Do not smoke.  Follow-up with your doctor as told. Contact a doctor if:  You have strange bruises or bleeding.  Your puffiness or pain in the affected area is not getting better.  You are taking medicine to lessen puffiness (anti-inflammatory medicine), and you get belly pain.  You have a fever. Get help right away if:  The phlebitis gets worse and you have more pain, puffiness (swelling), or redness.  You have trouble breathing or have chest pain. This information is not intended to replace advice given to you by your health care provider. Make  sure you discuss any questions you have with your health care provider. Document Released: 05/25/2009 Document Revised: 11/12/2015 Document Reviewed: 02/11/2013 Elsevier Interactive Patient Education  2017 [employer]

## 2018-05-09 NOTE — Progress Notes (Signed)
HPI:                                                                Robert Hines is a 62 y.o. male who presents to [facility] today for left thigh "knot"  Noticed a "tender spot" on his left medial thigh 4 days ago. Area appears bruised. Denies known trauma or injury. Denies recent surgery/immobilization. He denies chest pain, dyspnea, palpitations. He reports chronic lower extremity edema, which is unchanged from baseline. He endorses history of blood clot in one his legs approx 10-15 years ago.  More recently he had superficial thrombosis of his right upper extremity in July 2018     Past Medical History:  Diagnosis Date  . Arthritis    back  . Depression   . Diabetes mellitus without complication ([place])   . Hyperlipidemia   . Hypertension   . Renal disorder    calculi, multiple lithotripsies, stents  . Rotator cuff disorder   . Spinal pain    Past Surgical History:  Procedure Laterality Date  . ANTERIOR FUSION CERVICAL SPINE    . FOOT SURGERY    . HERNIA REPAIR    . LITHOTRIPSY    . SHOULDER ARTHROSCOPY WITH ROTATOR CUFF REPAIR AND SUBACROMIAL DECOMPRESSION Right 12/28/2015   Procedure: SHOULDER ARTHROSCOPY TAKE DOWN ROTATOR CUFF REPAIR AND SUBACROMIAL DECOMPRESSION, DEBRIDEMENT OF SLAP TEAR;  Surgeon: Tania Ade, MD;  Location: [facility];  Service: Orthopedics;  Laterality: Right;  SHOULDER ARTHROSCOPY TAKE DOWN ROTATOR CUFF REPAIR AND SUBACROMIAL DECOMPRESSION  . VASECTOMY Bilateral    Social History   Tobacco Use  . Smoking status: Former Smoker    Last attempt to quit: 10/01/2014    Years since quitting: 3.6  . Smokeless tobacco: Never Used  Substance Use Topics  . Alcohol use: Yes    Alcohol/week: 0.0 standard drinks    Comment: social   family history includes Diabetes in his father; Heart failure in his father and sister; Hypertension in his father, mother, and sister.    ROS:  negative except as noted in the HPI  Medications: Current Outpatient Medications  Medication Sig Dispense Refill  . aspirin 81 MG chewable tablet Chew by mouth daily.    [name] buPROPion (WELLBUTRIN XL) 300 MG 24 hr tablet TAKE 1 TABLET BY MOUTH DAILY 90 tablet 3  . cyclobenzaprine (FLEXERIL) 10 MG tablet Take 10 mg by mouth 3 (three) times daily as needed for muscle spasms.    [name] escitalopram (LEXAPRO) 10 MG tablet Take 1 tablet (10 mg total) by mouth daily. 90 tablet 3  . gabapentin (NEURONTIN) 300 MG capsule TAKE 3 CAPSULES BY MOUTH IN THE MORNING AND 4 CAPSULES IN THE EVENING 630 capsule 3  . Insulin Pen Needle 32G X 4 MM MISC Use as needed with toujeo pen 300 each 4  . lisinopril (PRINIVIL,ZESTRIL) 20 MG tablet TAKE 1 TABLET BY MOUTH DAILY 90 tablet 3  . simvastatin (ZOCOR) 40 MG tablet TAKE 1 TABLET BY MOUTH DAILY 90 tablet 3  . TOUJEO SOLOSTAR 300 UNIT/ML SOPN INJECT 92 UNITS INTO THE SKIN AT BEDTIME. 9 pen 3  . traMADol (ULTRAM) 50 MG tablet Take 1 tablet (50 mg total) by mouth at bedtime. [place]  tablet 0  . TRINTELLIX 5 MG TABS tablet TAKE 1 TABLET BY MOUTH DAILY 90 tablet 3  . TRULICITY 1.5 SF/4.2LT SOPN INJECT 1.5 MLS INTO THE SKIN ONCE A WEEK. 6 pen 3  . valACYclovir (VALTREX) 1000 MG tablet Take 1 tablet (1,000 mg total) by mouth 2 (two) times daily. 14 tablet 2  . XIGDUO XR 03-999 MG TB24 TAKE 1 TABLET BY MOUTH DAILY 90 tablet 3  . zolpidem (AMBIEN) 10 MG tablet Take 1 tablet (10 mg total) by mouth at bedtime as needed for sleep. 90 tablet 0   No current facility-administered medications for this visit.    Allergies  Allergen Reactions  . Codeine Nausea Only  . Duloxetine Diarrhea and Nausea Only       Objective:  BP (!) 145/91   Pulse 87   Temp 98 F (36.7 C) (Oral)   Wt 231 lb (104.8 kg)   BMI 30.48 kg/m  Physical Exam  Constitutional: He is active. He does not appear ill.  Cardiovascular: Normal rate, regular rhythm and normal heart sounds.  No murmur  heard. Pulmonary/Chest: Effort normal and breath sounds normal.  Musculoskeletal: He exhibits no edema.  Neurological: He is alert.  Skin: Skin is warm, dry and intact. Ecchymosis noted.        No results found for this or any previous visit (from the past 72 hour(s)). No results found.    Assessment and Plan: 62 y.o. male with   .[name] was seen today for mass.  Diagnoses and all orders for this visit:  Pain and swelling of left lower extremity -     US Venous Img Lower Unilateral Left -     apixaban (ELIQUIS) KIT; Use as directed -     apixaban (ELIQUIS) 5 MG TABS tablet; 10 mg PO bid x 7 days, then 5 mg PO bid  History of DVT (deep vein thrombosis)   Suspect superficial thrombophlebitis of the left thigh.  However his Wells score is 2 and DVT should be ruled out. Venous US will be performed at [facility]. He was provided with a paper prescription for Eliquis to fill if his ultrasound is positive for DVT.  We reviewed briefly the risks and benefits of anticoagulation.  We also reviewed the treatment plan for superficial thrombophlebitis to include naproxen twice daily for 1 to 2 weeks and warm compresses.    Patient education and anticipatory guidance given Patient agrees with treatment plan Follow-up as needed if symptoms worsen or fail to improve  Darlyne Russian PA-C

## 2018-05-10 MED ORDER — DOXYCYCLINE HYCLATE 100 MG PO TABS: 100.0000 mg | ORAL_TABLET | Freq: Two times a day (BID) | ORAL | 0 refills | Status: AC

## 2018-05-10 NOTE — Addendum Note (Signed)
Addended by: Levonne HubertUMMINGS, Demonta Wombles E on: 05/10/2018 09:38 AM   Modules accepted: Orders

## 2018-05-24 ENCOUNTER — Ambulatory Visit: Payer: 59 | Admitting: Sports Medicine

## 2018-05-24 ENCOUNTER — Encounter: Payer: Self-pay | Admitting: Sports Medicine

## 2018-05-24 VITALS — BP 126/67 | HR 89

## 2018-05-24 DIAGNOSIS — Z23 Encounter for immunization: Secondary | ICD-10-CM

## 2018-05-24 DIAGNOSIS — M25512 Pain in left shoulder: Secondary | ICD-10-CM | POA: Diagnosis not present

## 2018-05-24 DIAGNOSIS — R2242 Localized swelling, mass and lump, left lower limb: Secondary | ICD-10-CM | POA: Diagnosis not present

## 2018-05-24 NOTE — Progress Notes (Signed)
Subjective:    CC: Follow-up  HPI: Left shoulder pain: Adhesive capsulitis, now resolved after glenohumeral and subacromial injections the last visit.  Left thigh mass: Present for months to years, ultrasound was for the most part nonrevealing.  Only minimally tender.  I reviewed the past medical history, family history, social history, surgical history, and allergies today and no changes were needed.  Please see the problem list section below in epic for further details.  Past Medical History: Past Medical History:  Diagnosis Date  . Arthritis    back  . Depression   . Diabetes mellitus without complication ([facility])   . Hyperlipidemia   . Hypertension   . Renal disorder    calculi, multiple lithotripsies, stents  . Rotator cuff disorder   . Spinal pain    Past Surgical History: Past Surgical History:  Procedure Laterality Date  . ANTERIOR FUSION CERVICAL SPINE    . FOOT SURGERY    . HERNIA REPAIR    . LITHOTRIPSY    . SHOULDER ARTHROSCOPY WITH ROTATOR CUFF REPAIR AND SUBACROMIAL DECOMPRESSION Right 12/28/2015   Procedure: SHOULDER ARTHROSCOPY TAKE DOWN ROTATOR CUFF REPAIR AND SUBACROMIAL DECOMPRESSION, DEBRIDEMENT OF SLAP TEAR;  Surgeon: Jones BroomJustin Chandler, MD;  Location: [facility];  Service: Orthopedics;  Laterality: Right;  SHOULDER ARTHROSCOPY TAKE DOWN ROTATOR CUFF REPAIR AND SUBACROMIAL DECOMPRESSION  . VASECTOMY Bilateral    Social History: Social History   Socioeconomic History  . Marital status: Married    Spouse name: Not on file  . Number of children: Not on file  . Years of education: Not on file  . Highest education level: Not on file  Occupational History  . Not on file  Social Needs  . Financial resource strain: Not on file  . Food insecurity:    Worry: Not on file    Inability: Not on file  . Transportation needs:    Medical: Not on file    Non-medical: Not on file  Tobacco Use  . Smoking status: Former Smoker    Last attempt to  quit: 10/01/2014    Years since quitting: 3.6  . Smokeless tobacco: Never Used  Substance and Sexual Activity  . Alcohol use: Yes    Alcohol/week: 0.0 standard drinks    Comment: social  . Drug use: No  . Sexual activity: Not on file  Lifestyle  . Physical activity:    Days per week: Not on file    Minutes per session: Not on file  . Stress: Not on file  Relationships  . Social connections:    Talks on phone: Not on file    Gets together: Not on file    Attends religious service: Not on file    Active member of club or organization: Not on file    Attends meetings of clubs or organizations: Not on file    Relationship status: Not on file  Other Topics Concern  . Not on file  Social History Narrative  . Not on file   Family History: Family History  Problem Relation Age of Onset  . Hypertension Mother   . Heart failure Father   . Diabetes Father   . Hypertension Father   . Heart failure Sister   . Hypertension Sister    Allergies: Allergies  Allergen Reactions  . Codeine Nausea Only  . Duloxetine Diarrhea and Nausea Only   Medications: See med rec.  Review of Systems: No fevers, chills, night sweats, weight loss, chest pain, or shortness of  breath.   Objective:    General: Well Developed, well nourished, and in no acute distress.  Neuro: Alert and oriented x3, extra-ocular muscles intact, sensation grossly intact.  HEENT: Normocephalic, atraumatic, pupils equal round reactive to light, neck supple, no masses, no lymphadenopathy, thyroid nonpalpable.  Skin: Warm and dry, no rashes. Cardiac: Regular rate and rhythm, no murmurs rubs or gallops, no lower extremity edema.  Respiratory: Clear to auscultation bilaterally. Not using accessory muscles, speaking in full sentences. Left thigh: There is a palpable mass, moderately defined, does feel adherent to the underlying fascia.  Not really tender.  Approximately 1 to 2 cm across.  Impression and Recommendations:     Mass of left thigh 1 to 2 cm moderately defined not well movable mass on the inner left thigh. This feels more adherent to the deep fascia then I would typically expect for a lipoma or a sebaceous cyst. Ultrasound was for the most part unrevealing so I am going to get an MRI of the thigh with IV contrast for further evaluation. If it appears to be superficial to the deep fascia I will do an excision in the office. Normal recent renal function.  Left shoulder pain Adhesive capsulitis now resolved with left subacromial and glenohumeral injections from about a month ago. Return as needed for this. ___________________________________________ Ihor Austin. Benjamin Stain, M.D., ABFM., CAQSM. Primary Care and Sports Medicine [facility]  Adjunct Professor of Family Medicine  [employer]

## 2018-05-24 NOTE — Assessment & Plan Note (Signed)
1 to 2 cm moderately defined not well movable mass on the inner left thigh. This feels more adherent to the deep fascia then I would typically expect for a lipoma or a sebaceous cyst. Ultrasound was for the most part unrevealing so I am going to get an MRI of the thigh with IV contrast for further evaluation. If it appears to be superficial to the deep fascia I will do an excision in the office. Normal recent renal function.

## 2018-05-24 NOTE — Assessment & Plan Note (Signed)
Adhesive capsulitis now resolved with left subacromial and glenohumeral injections from about a month ago. Return as needed for this.

## 2018-05-29 ENCOUNTER — Telehealth: Payer: Self-pay | Admitting: Sports Medicine

## 2018-05-29 NOTE — Telephone Encounter (Signed)
Left VM for Pt to return clinic call.  

## 2018-05-29 NOTE — Telephone Encounter (Signed)
Spoke to [employer] provider, MRI denied, they are requesting x-ray which is an unnecessary test with unnecessary radiation for a soft tissue subcutaneous mass, ultrasound has already been done. We argued for a while, this will probably need an appeal.  Please let patient know that his insurance company is denying the MRI and so will probably not be done.  Customer ID number: [id number] Reference code: [id number]

## 2018-07-09 ENCOUNTER — Other Ambulatory Visit: Payer: Self-pay | Admitting: Sports Medicine

## 2018-07-09 DIAGNOSIS — Z794 Long term (current) use of insulin: Principal | ICD-10-CM

## 2018-07-09 DIAGNOSIS — E119 Type 2 diabetes mellitus without complications: Secondary | ICD-10-CM

## 2018-07-09 DIAGNOSIS — F329 Major depressive disorder, single episode, unspecified: Secondary | ICD-10-CM

## 2018-07-09 DIAGNOSIS — F32A Depression, unspecified: Secondary | ICD-10-CM

## 2018-08-02 DIAGNOSIS — H52222 Regular astigmatism, left eye: Secondary | ICD-10-CM | POA: Insufficient documentation

## 2018-08-02 DIAGNOSIS — H25812 Combined forms of age-related cataract, left eye: Secondary | ICD-10-CM

## 2018-08-02 HISTORY — DX: Combined forms of age-related cataract, left eye: H25.812

## 2018-08-06 ENCOUNTER — Other Ambulatory Visit: Payer: Self-pay | Admitting: *Deleted

## 2018-08-06 DIAGNOSIS — Z794 Long term (current) use of insulin: Principal | ICD-10-CM

## 2018-08-06 DIAGNOSIS — E119 Type 2 diabetes mellitus without complications: Secondary | ICD-10-CM

## 2018-08-06 MED ORDER — INSULIN GLARGINE (1 UNIT DIAL) 300 UNIT/ML ~~LOC~~ SOPN: 92.0000 [IU] | PEN_INJECTOR | Freq: Every day | SUBCUTANEOUS | 3 refills | Status: DC

## 2018-09-01 ENCOUNTER — Other Ambulatory Visit: Payer: Self-pay | Admitting: Sports Medicine

## 2018-09-01 DIAGNOSIS — I10 Essential (primary) hypertension: Secondary | ICD-10-CM

## 2018-09-13 ENCOUNTER — Telehealth: Payer: Self-pay | Admitting: *Deleted

## 2018-09-13 DIAGNOSIS — Z794 Long term (current) use of insulin: Principal | ICD-10-CM

## 2018-09-13 DIAGNOSIS — E119 Type 2 diabetes mellitus without complications: Secondary | ICD-10-CM

## 2018-09-13 MED ORDER — BASAGLAR KWIKPEN 100 UNIT/ML ~~LOC~~ SOPN: 92.0000 [IU] | PEN_INJECTOR | Freq: Every day | SUBCUTANEOUS | 11 refills | Status: DC

## 2018-09-13 NOTE — Telephone Encounter (Signed)
Pt's wife left a vm stating that the pharmacy was supposed to send over a request to change his Toujeo to something different because ins no longer covers it.

## 2018-09-13 NOTE — Telephone Encounter (Signed)
Sending in [place].

## 2018-09-13 NOTE — Telephone Encounter (Signed)
Attempted to start the PA and the preferred medication is Hospital doctor. I have left a brief VM that the patient may have to start another medication and asked the patient to call back with questions. Please advise.

## 2018-09-24 ENCOUNTER — Other Ambulatory Visit: Payer: Self-pay | Admitting: Sports Medicine

## 2018-11-27 ENCOUNTER — Other Ambulatory Visit: Payer: Self-pay | Admitting: Sports Medicine

## 2018-12-11 ENCOUNTER — Other Ambulatory Visit: Payer: Self-pay | Admitting: Orthopedic Surgery

## 2018-12-17 ENCOUNTER — Encounter: Payer: Self-pay | Admitting: Physician Assistant

## 2018-12-17 ENCOUNTER — Ambulatory Visit (INDEPENDENT_AMBULATORY_CARE_PROVIDER_SITE_OTHER): Payer: 59 | Admitting: Physician Assistant

## 2018-12-17 ENCOUNTER — Telehealth: Payer: Self-pay | Admitting: *Deleted

## 2018-12-17 VITALS — BP 142/85 | HR 92 | Temp 99.6°F

## 2018-12-17 DIAGNOSIS — Z20822 Contact with and (suspected) exposure to covid-19: Secondary | ICD-10-CM

## 2018-12-17 DIAGNOSIS — R062 Wheezing: Secondary | ICD-10-CM

## 2018-12-17 DIAGNOSIS — J989 Respiratory disorder, unspecified: Secondary | ICD-10-CM

## 2018-12-17 DIAGNOSIS — R509 Fever, unspecified: Secondary | ICD-10-CM

## 2018-12-17 DIAGNOSIS — R6889 Other general symptoms and signs: Secondary | ICD-10-CM

## 2018-12-17 MED ORDER — BUDESONIDE-FORMOTEROL FUMARATE 80-4.5 MCG/ACT IN AERO: 2.0000 | INHALATION_SPRAY | Freq: Two times a day (BID) | RESPIRATORY_TRACT | 0 refills | Status: DC

## 2018-12-17 NOTE — Telephone Encounter (Signed)
Patient and wife both have sinus like symptoms. Patient is scheduled for rotator cuff repair on July 7th but due to his symptoms his PCP wants him to be tested sooner. Wife will call Orthopedics to discuss with them since he is sick and scheduled for surgery July 7th to see how he should proceed. Instructions given and order placed.  Pt scheduled on 12/18/18 @ 9:30 @ [facility].  Wife to call back and cancel if he needs to have pre procedure testing at [facility].

## 2018-12-17 NOTE — Progress Notes (Signed)
Virtual Visit via Video Note  I connected with Robert Hines on 12/17/18 at  1:20 PM EDT by a video enabled telemedicine application and verified that I am speaking with the correct person using two identifiers.   I discussed the limitations of evaluation and management by telemedicine and the availability of in person appointments. The patient expressed understanding and agreed to proceed.  History of Present Illness: HPI:                                                                Robert Hines is a 63 y.o. male   CC: fever/SOB  Onset: Saturday (3 days ago) Endorses fever up to 99.9, malaise, weakness, fatigue, headache, nasal congestion, occasional nonproductive "deep" cough, "rattle in the chest" and mild dyspnea on exertion Former smoker, quit 5 months ago.  Wife is also sick with similar symptoms. Reports there was a positive case of COVID-19 at his work, but it was in another area and he did not have direct contact with the person. He reports he wears a mask and adheres to social distancing.  Has been taking Tylenol, Sudafed and Tussin for symptoms.  Past Medical History:  Diagnosis Date  . Arthritis    back  . Depression   . Diabetes mellitus without complication ([facility])   . Hyperlipidemia   . Hypertension   . Renal disorder    calculi, multiple lithotripsies, stents  . Rotator cuff disorder   . Spinal pain    Past Surgical History:  Procedure Laterality Date  . ANTERIOR FUSION CERVICAL SPINE    . FOOT SURGERY    . HERNIA REPAIR    . LITHOTRIPSY    . SHOULDER ARTHROSCOPY WITH ROTATOR CUFF REPAIR AND SUBACROMIAL DECOMPRESSION Right 12/28/2015   Procedure: SHOULDER ARTHROSCOPY TAKE DOWN ROTATOR CUFF REPAIR AND SUBACROMIAL DECOMPRESSION, DEBRIDEMENT OF SLAP TEAR;  Surgeon: Jones BroomJustin Chandler, MD;  Location: [facility];  Service: Orthopedics;  Laterality: Right;  SHOULDER ARTHROSCOPY TAKE DOWN ROTATOR CUFF REPAIR AND SUBACROMIAL DECOMPRESSION  .  VASECTOMY Bilateral    Social History   Tobacco Use  . Smoking status: Former Smoker    Quit date: 10/01/2014    Years since quitting: 4.2  . Smokeless tobacco: Never Used  Substance Use Topics  . Alcohol use: Yes    Alcohol/week: 0.0 standard drinks    Comment: social   family history includes Diabetes in his father; Heart failure in his father and sister; Hypertension in his father, mother, and sister.    ROS: negative except as noted in the HPI  Medications: Current Outpatient Medications  Medication Sig Dispense Refill  . aspirin 81 MG chewable tablet Chew by mouth daily.    . budesonide-formoterol (SYMBICORT) 80-4.5 MCG/ACT inhaler Inhale 2 puffs into the lungs 2 (two) times daily. 1 Inhaler 0  . buPROPion (WELLBUTRIN XL) 300 MG 24 hr tablet TAKE 1 TABLET BY MOUTH DAILY 90 tablet 3  . cyclobenzaprine (FLEXERIL) 10 MG tablet Take 10 mg by mouth 3 (three) times daily as needed for muscle spasms.    [name]. escitalopram (LEXAPRO) 10 MG tablet Take 1 tablet (10 mg total) by mouth daily. 90 tablet 3  . gabapentin (NEURONTIN) 300 MG capsule TAKE 3 CAPSULES BY MOUTH IN THE MORNING AND  4 CAPSULES IN THE EVENING 630 capsule 3  . Insulin Glargine (BASAGLAR KWIKPEN) 100 UNIT/ML SOPN Inject 0.92 mLs (92 Units total) into the skin at bedtime. 9 pen 11  . Insulin Pen Needle 32G X 4 MM MISC Use as needed with toujeo pen 300 each 4  . lisinopril (PRINIVIL,ZESTRIL) 20 MG tablet TAKE 1 TABLET BY MOUTH DAILY 90 tablet 3  . simvastatin (ZOCOR) 40 MG tablet TAKE 1 TABLET BY MOUTH DAILY 90 tablet 3  . traMADol (ULTRAM) 50 MG tablet Take 1 tablet (50 mg total) by mouth at bedtime. 90 tablet 0  . TRINTELLIX 5 MG TABS tablet TAKE 1 TABLET BY MOUTH DAILY 90 tablet 3  . TRULICITY 1.5 VC/9.4WH SOPN INJECT 1.5 MLS INTO THE SKIN ONCE A WEEK. 6 pen 3  . valACYclovir (VALTREX) 1000 MG tablet Take 1 tablet (1,000 mg total) by mouth 2 (two) times daily. 14 tablet 2  . XIGDUO XR 03-999 MG TB24 TAKE 1 TABLET BY  MOUTH DAILY 90 tablet 3  . zolpidem (AMBIEN) 10 MG tablet TAKE 1 TABLET AT BEDTIME AS NEEDED SLEEP 90 tablet 0   No current facility-administered medications for this visit.    Allergies  Allergen Reactions  . Codeine Diarrhea and Nausea Only  . Duloxetine Diarrhea and Nausea Only       Objective:  BP (!) 142/85   Pulse 92   Temp 99.6 F (37.6 C)  Gen:  alert, not ill-appearing, no distress, appropriate for age HEENT: head normocephalic without obvious abnormality, conjunctiva and cornea clear, trachea midline Pulm: Normal work of breathing, normal phonation Neuro: alert and oriented x 3    No results found for this or any previous visit (from the past 72 hour(s)). No results found.    Assessment and Plan: 63 y.o. male with   .Diagnoses and all orders for this visit:  Suspected Covid-19 Virus Infection -     MyChart COVID-19 home monitoring program  Febrile respiratory illness  Symptom of wheezing -     budesonide-formoterol (SYMBICORT) 80-4.5 MCG/ACT inhaler; Inhale 2 puffs into the lungs 2 (two) times daily.   Patient-provided vital signs reviewed. Low grade fever of 99.6, no tachycardia, BP is hypertensive. Counseled to discontinue Sudafed. Physical exam limited by video visit today. Patient is speaking in full sentences and does not appear to be in any respiratory distress. Symptoms are highly suspicious for COVID-19. Community testing ordered Patient counseled to quarantine. Home monitoring guidelines and work note sent via [facility] on supportive care Starting Symbicort for subjective wheezing. Patient counseled on MDI use   Follow-up e-visit in 2 days or sooner as needed if symptoms worsen or fail to improve   Follow Up Instructions:    I discussed the assessment and treatment plan with the patient. The patient was provided an opportunity to ask questions and all were answered. The patient agreed with the plan and demonstrated an understanding  of the instructions.   The patient was advised to call back or seek an in-person evaluation if the symptoms worsen or if the condition fails to improve as anticipated.  I provided 10 minutes of non-face-to-face time during this encounter.   Trixie Dredge, [place]

## 2018-12-17 NOTE — Telephone Encounter (Signed)
-----   Message from [facility], [place] sent at 12/17/2018  1:36 PM EDT ----- Please schedule patient with wife for COVID testing

## 2018-12-18 ENCOUNTER — Other Ambulatory Visit: Payer: 59

## 2018-12-18 DIAGNOSIS — Z20822 Contact with and (suspected) exposure to covid-19: Secondary | ICD-10-CM

## 2018-12-19 ENCOUNTER — Ambulatory Visit (INDEPENDENT_AMBULATORY_CARE_PROVIDER_SITE_OTHER): Payer: 59 | Admitting: Physician Assistant

## 2018-12-19 ENCOUNTER — Encounter: Payer: Self-pay | Admitting: Physician Assistant

## 2018-12-19 VITALS — BP 131/80 | HR 90 | Temp 100.0°F

## 2018-12-19 DIAGNOSIS — G8929 Other chronic pain: Secondary | ICD-10-CM

## 2018-12-19 DIAGNOSIS — R05 Cough: Secondary | ICD-10-CM | POA: Diagnosis not present

## 2018-12-19 DIAGNOSIS — Z20822 Contact with and (suspected) exposure to covid-19: Secondary | ICD-10-CM

## 2018-12-19 DIAGNOSIS — R509 Fever, unspecified: Secondary | ICD-10-CM

## 2018-12-19 DIAGNOSIS — Z9189 Other specified personal risk factors, not elsewhere classified: Secondary | ICD-10-CM

## 2018-12-19 DIAGNOSIS — R5381 Other malaise: Secondary | ICD-10-CM

## 2018-12-19 DIAGNOSIS — R0602 Shortness of breath: Secondary | ICD-10-CM

## 2018-12-19 DIAGNOSIS — M25511 Pain in right shoulder: Secondary | ICD-10-CM

## 2018-12-19 DIAGNOSIS — J989 Respiratory disorder, unspecified: Secondary | ICD-10-CM

## 2018-12-19 DIAGNOSIS — Z20828 Contact with and (suspected) exposure to other viral communicable diseases: Secondary | ICD-10-CM

## 2018-12-19 MED ORDER — DEXAMETHASONE 4 MG PO TABS: 4.0000 mg | ORAL_TABLET | Freq: Three times a day (TID) | ORAL | 0 refills | Status: AC

## 2018-12-19 MED ORDER — DICLOFENAC SODIUM 1 % TD GEL: 2.0000 g | Freq: Four times a day (QID) | TRANSDERMAL | 0 refills | Status: DC

## 2018-12-19 NOTE — Progress Notes (Signed)
Virtual Visit via Telephone Note  I connected with Robert BertinGary E Bastin on 12/19/18 at  8:10 AM EDT by a video enabled telemedicine application and verified that I am speaking with the correct person using two identifiers.   I discussed the limitations of evaluation and management by telemedicine and the availability of in person appointments. The patient expressed understanding and agreed to proceed.  History of Present Illness: HPI:                                                                Robert Hines is a 63 y.o. male with PMH DM2, HTN, HLD, hx of DVT  CC: fever/SOB follow-up  Patient and wife are both present for this visit today  Interval hx 12/19/18 COVID test is still pending (performed yesterday at 9 am) Morning fasting glucose 130 today. Patient reports he feels awful. Symptoms unchanged overall. Still experiencing fever (T max 100), chills and nightsweats as well as malaise and cough. States cough is a little bit productive. He started Symbicort inhaler last night (pharmacy was delayed in filling it) and has not noticed much of a difference Denies chest discomfort/tightness and SOB at rest.   He is also experiencing severe right shoulder pain for which surgery is now delayed. He is currently taking Tramadol 50 mg every 4 hr, Tylenol 1000 mg every 4 hr, and Gabapentin 900 mg QAM and 1200 mg QHS  12/17/18 Onset: Saturday (3 days ago) Endorses fever up to 99.9, malaise, weakness, fatigue, headache, nasal congestion, occasional nonproductive "deep" cough, "rattle in the chest" and mild dyspnea on exertion Former smoker, quit 5 months ago.  Wife is also sick with similar symptoms. Reports there was a positive case of COVID-19 at his work, but it was in another area and he did not have direct contact with the person. He reports he wears a mask and adheres to social distancing. Has been taking Tylenol, Sudafed and Tussin for symptoms.  Past Medical History:  Diagnosis Date  .  Arthritis    back  . Depression   . Diabetes mellitus without complication (HCC)   . Hyperlipidemia   . Hypertension   . Renal disorder    calculi, multiple lithotripsies, stents  . Rotator cuff disorder   . Spinal pain    Past Surgical History:  Procedure Laterality Date  . ANTERIOR FUSION CERVICAL SPINE    . FOOT SURGERY    . HERNIA REPAIR    . LITHOTRIPSY    . SHOULDER ARTHROSCOPY WITH ROTATOR CUFF REPAIR AND SUBACROMIAL DECOMPRESSION Right 12/28/2015   Procedure: SHOULDER ARTHROSCOPY TAKE DOWN ROTATOR CUFF REPAIR AND SUBACROMIAL DECOMPRESSION, DEBRIDEMENT OF SLAP TEAR;  Surgeon: Jones BroomJustin Chandler, MD;  Location: [facility];  Service: Orthopedics;  Laterality: Right;  SHOULDER ARTHROSCOPY TAKE DOWN ROTATOR CUFF REPAIR AND SUBACROMIAL DECOMPRESSION  . VASECTOMY Bilateral    Social History   Tobacco Use  . Smoking status: Former Smoker    Types: Cigarettes    Quit date: 10/01/2018    Years since quitting: 0.2  . Smokeless tobacco: Never Used  Substance Use Topics  . Alcohol use: Yes    Alcohol/week: 0.0 standard drinks    Comment: social   family history includes Diabetes in his father; Heart failure in his  father and sister; Hypertension in his father, mother, and sister.    ROS: negative except as noted in the HPI  Medications: Current Outpatient Medications  Medication Sig Dispense Refill  . acetaminophen (TYLENOL) 500 MG tablet Take 1,000 mg by mouth every 6 (six) hours as needed.    . traMADol (ULTRAM) 50 MG tablet Take 1 tablet by mouth every 4 (four) hours as needed.    [name]. aspirin 81 MG chewable tablet Chew by mouth daily.    . budesonide-formoterol (SYMBICORT) 80-4.5 MCG/ACT inhaler Inhale 2 puffs into the lungs 2 (two) times daily. 1 Inhaler 0  . buPROPion (WELLBUTRIN XL) 300 MG 24 hr tablet TAKE 1 TABLET BY MOUTH DAILY 90 tablet 3  . cyclobenzaprine (FLEXERIL) 10 MG tablet Take 10 mg by mouth 3 (three) times daily as needed for muscle spasms.    [name].  dexamethasone (DECADRON) 4 MG tablet Take 1 tablet (4 mg total) by mouth 3 (three) times daily for 5 days. 15 tablet 0  . diclofenac sodium (VOLTAREN) 1 % GEL Apply 2 g topically 4 (four) times daily. To affected shoulder joint. 100 g 0  . escitalopram (LEXAPRO) 10 MG tablet Take 1 tablet (10 mg total) by mouth daily. 90 tablet 3  . gabapentin (NEURONTIN) 300 MG capsule TAKE 3 CAPSULES BY MOUTH IN THE MORNING AND 4 CAPSULES IN THE EVENING 630 capsule 3  . Insulin Glargine (BASAGLAR KWIKPEN) 100 UNIT/ML SOPN Inject 0.92 mLs (92 Units total) into the skin at bedtime. 9 pen 11  . Insulin Pen Needle 32G X 4 MM MISC Use as needed with toujeo pen 300 each 4  . lisinopril (PRINIVIL,ZESTRIL) 20 MG tablet TAKE 1 TABLET BY MOUTH DAILY 90 tablet 3  . simvastatin (ZOCOR) 40 MG tablet TAKE 1 TABLET BY MOUTH DAILY 90 tablet 3  . TRINTELLIX 5 MG TABS tablet TAKE 1 TABLET BY MOUTH DAILY 90 tablet 3  . TRULICITY 1.5 MG/0.5ML SOPN INJECT 1.5 MLS INTO THE SKIN ONCE A WEEK. 6 pen 3  . valACYclovir (VALTREX) 1000 MG tablet Take 1 tablet (1,000 mg total) by mouth 2 (two) times daily. 14 tablet 2  . XIGDUO XR 03-999 MG TB24 TAKE 1 TABLET BY MOUTH DAILY 90 tablet 3  . zolpidem (AMBIEN) 10 MG tablet TAKE 1 TABLET AT BEDTIME AS NEEDED SLEEP 90 tablet 0   No current facility-administered medications for this visit.    Allergies  Allergen Reactions  . Codeine Diarrhea and Nausea Only  . Duloxetine Diarrhea and Nausea Only       Objective:  BP 131/80   Pulse 90   Temp 100 F (37.8 C)  Pulm: Normal work of breathing, normal phonation Neuro: alert and oriented x 3    No results found for this or any previous visit (from the past 72 hour(s)). No results found.    Assessment and Plan: 63 y.o. male with   .Diagnoses and all orders for this visit:  Febrile respiratory illness  Suspected Covid-19 Virus Infection  At risk for respiratory complications  Chronic right shoulder pain -     dexamethasone  (DECADRON) 4 MG tablet; Take 1 tablet (4 mg total) by mouth 3 (three) times daily for 5 days. -     diclofenac sodium (VOLTAREN) 1 % GEL; Apply 2 g topically 4 (four) times daily. To affected shoulder joint.   Patient-provided vital signs reviewed. Febrile at 100, no tachycardia, BP is in stage 1 hypertensive range. Morning fasting glucose is in range. Physical  exam limited by telephone visit COVID-19 testing pending Given patient's high risk for WHQPR-91 complications, I consulted with his PCP, Dr. Dianah Field today, who agreed that he should be examined in person including lung auscultation and pulse ox. Patient is scheduled for a drive-by physical exam from his vehicle with his PCP tomorrow PCP also wanted to start him on Decadron 4 mg tid x 5 days. This should also help with shoulder pain Cont Symbicort and supportive care  Chronic right shoulder pain - Counseled to reduce Tylenol dose to 1000 mg Q6H and not to exceed 4g per day - Cont Tramadol 50 mg Q4H - add a 3rd dose of Gabapentin 300 mg at lunch time. If tolerated, after 3 days can increase to 600 mg. Continue morning and evening at current dose - Decadron as above - Diclofenac gel 2g applied to affected joint QID prn   Follow Up Instructions:    I discussed the assessment and treatment plan with the patient. The patient was provided an opportunity to ask questions and all were answered. The patient agreed with the plan and demonstrated an understanding of the instructions.   The patient was advised to call back or seek an in-person evaluation if the symptoms worsen or if the condition fails to improve as anticipated.  I provided 10 minutes of non-face-to-face time during this encounter.   Trixie Dredge, [place]

## 2018-12-20 ENCOUNTER — Ambulatory Visit (INDEPENDENT_AMBULATORY_CARE_PROVIDER_SITE_OTHER): Payer: 59 | Admitting: Sports Medicine

## 2018-12-20 ENCOUNTER — Encounter (INDEPENDENT_AMBULATORY_CARE_PROVIDER_SITE_OTHER): Payer: Self-pay

## 2018-12-20 DIAGNOSIS — R509 Fever, unspecified: Secondary | ICD-10-CM | POA: Diagnosis not present

## 2018-12-20 DIAGNOSIS — U071 COVID-19: Secondary | ICD-10-CM | POA: Insufficient documentation

## 2018-12-20 HISTORY — DX: COVID-19: U07.1

## 2018-12-20 NOTE — Progress Notes (Signed)
Subjective:    CC: Feeling sick  HPI: Orest has had fevers, chills, cough, muscle aches, body aches.  He is currently being tested for COVID-19.  He was asked to see me for a physical exam today while he was in his car.  He started Decadron yesterday, feels as though he is turning the corner today.  I reviewed the past medical history, family history, social history, surgical history, and allergies today and no changes were needed.  Please see the problem list section below in epic for further details.  Past Medical History: Past Medical History:  Diagnosis Date  . Arthritis    back  . Depression   . Diabetes mellitus without complication ([place])   . Hyperlipidemia   . Hypertension   . Renal disorder    calculi, multiple lithotripsies, stents  . Rotator cuff disorder   . Spinal pain    Past Surgical History: Past Surgical History:  Procedure Laterality Date  . ANTERIOR FUSION CERVICAL SPINE    . FOOT SURGERY    . HERNIA REPAIR    . LITHOTRIPSY    . SHOULDER ARTHROSCOPY WITH ROTATOR CUFF REPAIR AND SUBACROMIAL DECOMPRESSION Right 12/28/2015   Procedure: SHOULDER ARTHROSCOPY TAKE DOWN ROTATOR CUFF REPAIR AND SUBACROMIAL DECOMPRESSION, DEBRIDEMENT OF SLAP TEAR;  Surgeon: Tania Ade, MD;  Location: [facility];  Service: Orthopedics;  Laterality: Right;  SHOULDER ARTHROSCOPY TAKE DOWN ROTATOR CUFF REPAIR AND SUBACROMIAL DECOMPRESSION  . VASECTOMY Bilateral    Social History: Social History   Socioeconomic History  . Marital status: Married    Spouse name: Not on file  . Number of children: Not on file  . Years of education: Not on file  . Highest education level: Not on file  Occupational History  . Not on file  Social Needs  . Financial resource strain: Not on file  . Food insecurity    Worry: Not on file    Inability: Not on file  . Transportation needs    Medical: Not on file    Non-medical: Not on file  Tobacco Use  . Smoking status: Former  Smoker    Types: Cigarettes    Quit date: 10/01/2018    Years since quitting: 0.2  . Smokeless tobacco: Never Used  Substance and Sexual Activity  . Alcohol use: Yes    Alcohol/week: 0.0 standard drinks    Comment: social  . Drug use: No  . Sexual activity: Yes    Birth control/protection: None  Lifestyle  . Physical activity    Days per week: Not on file    Minutes per session: Not on file  . Stress: Not on file  Relationships  . Social Herbalist on phone: Not on file    Gets together: Not on file    Attends religious service: Not on file    Active member of club or organization: Not on file    Attends meetings of clubs or organizations: Not on file    Relationship status: Not on file  Other Topics Concern  . Not on file  Social History Narrative  . Not on file   Family History: Family History  Problem Relation Age of Onset  . Hypertension Mother   . Heart failure Father   . Diabetes Father   . Hypertension Father   . Heart failure Sister   . Hypertension Sister    Allergies: Allergies  Allergen Reactions  . Codeine Diarrhea and Nausea Only  . Duloxetine Diarrhea  and Nausea Only   Medications: See med rec.  Review of Systems: No fevers, chills, night sweats, weight loss, chest pain, or shortness of breath.   Objective:    General: Well Developed, well nourished, and in no acute distress.  Neuro: Alert and oriented x3, extra-ocular muscles intact, sensation grossly intact.  HEENT: Normocephalic, atraumatic, pupils equal round reactive to light, neck supple, no masses, no lymphadenopathy, thyroid nonpalpable.  Skin: Warm and dry, no rashes. Cardiac: Regular rate and rhythm, no murmurs rubs or gallops, no lower extremity edema.  Respiratory: Coarse sounds in the upper lobes. Not using accessory muscles, speaking in full sentences.    Impression and Recommendations:    Febrile illness [name] has had a few days of fevers up to 100, muscle aches, body  aches, cough, shortness of breath. I have advised that he start Decadron yesterday, he is turning the corner, no fever today, still some weakness but feeling much better. Using appropriate PPE including an in 95 mask, goggles, gown and gloves I checked his pulse oximeter which was 97%. Patient never left his car. He does have some coarse sounds in his upper lobes. Otherwise speaking full sentences. Continue to isolate himself, he is still awaiting his COVID testing, I have advised that he postpone his shoulder surgery which is scheduled for next Thursday.   ___________________________________________ Ihor Austinhomas J. Benjamin Stainhekkekandam, M.D., ABFM., CAQSM. Primary Care and Sports Medicine [facility]  Adjunct Professor of Family Medicine  [employer]

## 2018-12-20 NOTE — Assessment & Plan Note (Signed)
Robert Hines has had a few days of fevers up to 100, muscle aches, body aches, cough, shortness of breath. I have advised that he start Decadron yesterday, he is turning the corner, no fever today, still some weakness but feeling much better. Using appropriate PPE including an in 95 mask, goggles, gown and gloves I checked his pulse oximeter which was 97%. Patient never left his car. He does have some coarse sounds in his upper lobes. Otherwise speaking full sentences. Continue to isolate himself, he is still awaiting his COVID testing, I have advised that he postpone his shoulder surgery which is scheduled for next Thursday.

## 2018-12-20 NOTE — Progress Notes (Signed)
Mr. Bissonette was called to schedule COVID swab testing for July 6.  He stated he is not feeling welll and just completed a COVID swab awaiting results.  I informed him of our [facility] Policy that [place] must be completed by our facility within 3 days prior to surgery.  Mr. Jilek declined to have another test done on Monday.  Left a voicemail for Bagley at Dr. Bettina Gavia office.

## 2018-12-21 ENCOUNTER — Encounter: Payer: Self-pay | Admitting: Sports Medicine

## 2018-12-21 ENCOUNTER — Encounter (INDEPENDENT_AMBULATORY_CARE_PROVIDER_SITE_OTHER): Payer: Self-pay

## 2018-12-21 ENCOUNTER — Telehealth: Payer: Self-pay | Admitting: Osteopathic Medicine

## 2018-12-21 LAB — NOVEL CORONAVIRUS, NAA: SARS-CoV-2, NAA: NOT DETECTED

## 2018-12-21 NOTE — Telephone Encounter (Signed)
Spoke to patient's wife (see her chart if needed) re: COVID results. Hers were (+) his were (-) but both have symptoms. I advised them that I'd take the negative results with a grain of salt, possible false negative and he should also quarantine appropriately.

## 2018-12-22 ENCOUNTER — Encounter (INDEPENDENT_AMBULATORY_CARE_PROVIDER_SITE_OTHER): Payer: Self-pay

## 2018-12-22 NOTE — Telephone Encounter (Signed)
Agreed, I saw him in a drive-by Thursday, his test was still pending but he was improving on decadron.  His test is definitely a false negative.

## 2018-12-23 ENCOUNTER — Encounter (INDEPENDENT_AMBULATORY_CARE_PROVIDER_SITE_OTHER): Payer: Self-pay

## 2018-12-24 ENCOUNTER — Telehealth: Payer: Self-pay | Admitting: *Deleted

## 2018-12-24 ENCOUNTER — Encounter (INDEPENDENT_AMBULATORY_CARE_PROVIDER_SITE_OTHER): Payer: Self-pay

## 2018-12-24 NOTE — Telephone Encounter (Signed)
Do another drive-by auscultation and pulse ox check.

## 2018-12-24 NOTE — Telephone Encounter (Signed)
Pt's wife left vm stating that he finished the steroids today and is beginning to have a hard time getting a good deep breath again, is having higher temperatures, and is starting to lose his voice. Please advise on what you would like them to do.

## 2018-12-25 ENCOUNTER — Ambulatory Visit: Payer: 59 | Admitting: Sports Medicine

## 2018-12-25 DIAGNOSIS — K219 Gastro-esophageal reflux disease without esophagitis: Secondary | ICD-10-CM | POA: Insufficient documentation

## 2018-12-25 NOTE — Telephone Encounter (Signed)
Patient was admitted to the hospital and will not be coming to the office. PCP is aware.

## 2018-12-26 ENCOUNTER — Encounter (INDEPENDENT_AMBULATORY_CARE_PROVIDER_SITE_OTHER): Payer: Self-pay

## 2018-12-26 MED ORDER — ASPIRIN 81 MG PO CHEW
81.00 | CHEWABLE_TABLET | ORAL | Status: DC
Start: 2019-01-01 — End: 2018-12-26

## 2018-12-26 MED ORDER — ALUM & MAG HYDROXIDE-SIMETH 200-200-20 MG/5ML PO SUSP
30.00 | ORAL | Status: DC
Start: ? — End: 2018-12-26

## 2018-12-26 MED ORDER — SODIUM CHLORIDE 0.9 % IV SOLN
10.00 | INTRAVENOUS | Status: DC
Start: ? — End: 2018-12-26

## 2018-12-26 MED ORDER — BUPROPION HCL ER (XL) 150 MG PO TB24
300.00 | ORAL_TABLET | ORAL | Status: DC
Start: 2019-01-01 — End: 2018-12-26

## 2018-12-26 MED ORDER — CHLOROBUTANOL-EUGENOL & APAP
0.40 | Status: DC
Start: ? — End: 2018-12-26

## 2018-12-26 MED ORDER — ATORVASTATIN CALCIUM 20 MG PO TABS
20.00 | ORAL_TABLET | ORAL | Status: DC
Start: 2018-12-31 — End: 2018-12-26

## 2018-12-26 MED ORDER — CYCLOBENZAPRINE HCL 10 MG PO TABS
10.00 | ORAL_TABLET | ORAL | Status: DC
Start: ? — End: 2018-12-26

## 2018-12-26 MED ORDER — GENERIC EXTERNAL MEDICATION
Status: DC
Start: ? — End: 2018-12-26

## 2018-12-26 MED ORDER — VICON FORTE PO CAPS
17.00 | ORAL_CAPSULE | ORAL | Status: DC
Start: ? — End: 2018-12-26

## 2018-12-26 MED ORDER — DEXAMETHASONE SODIUM PHOSPHATE 4 MG/ML IJ SOLN
6.00 | INTRAMUSCULAR | Status: DC
Start: 2018-12-29 — End: 2018-12-26

## 2018-12-26 MED ORDER — INSULIN GLARGINE 100 UNIT/ML ~~LOC~~ SOLN
1.00 | SUBCUTANEOUS | Status: DC
Start: 2018-12-31 — End: 2018-12-26

## 2018-12-26 MED ORDER — ALBUTEROL SULFATE HFA 108 (90 BASE) MCG/ACT IN AERS
2.00 | INHALATION_SPRAY | RESPIRATORY_TRACT | Status: DC
Start: 2018-12-31 — End: 2018-12-26

## 2018-12-26 MED ORDER — INSULIN GLARGINE 100 UNIT/ML ~~LOC~~ SOLN
1.00 | SUBCUTANEOUS | Status: DC
Start: ? — End: 2018-12-26

## 2018-12-26 MED ORDER — VORICONAZOLE 200 MG IV SOLR
10.00 | INTRAVENOUS | Status: DC
Start: ? — End: 2018-12-26

## 2018-12-26 MED ORDER — ETRAFON 2-10 2-10 MG OR TABS
650.00 | ORAL_TABLET | ORAL | Status: DC
Start: ? — End: 2018-12-26

## 2018-12-26 MED ORDER — INSULIN LISPRO 100 UNIT/ML ~~LOC~~ SOLN
1.00 | SUBCUTANEOUS | Status: DC
Start: ? — End: 2018-12-26

## 2018-12-26 MED ORDER — QUINERVA 260 MG PO TABS
650.00 | ORAL_TABLET | ORAL | Status: DC
Start: ? — End: 2018-12-26

## 2018-12-26 MED ORDER — HYDRALAZINE HCL 20 MG/ML IJ SOLN
10.00 | INTRAMUSCULAR | Status: DC
Start: ? — End: 2018-12-26

## 2018-12-26 MED ORDER — INSULIN LISPRO 100 UNIT/ML ~~LOC~~ SOLN
1.00 | SUBCUTANEOUS | Status: DC
Start: 2018-12-31 — End: 2018-12-26

## 2018-12-26 MED ORDER — DM-GUAIFENESIN ER 30-600 MG PO TB12
1.00 | ORAL_TABLET | ORAL | Status: DC
Start: ? — End: 2018-12-26

## 2018-12-26 MED ORDER — FAMOTIDINE 20 MG PO TABS
20.00 | ORAL_TABLET | ORAL | Status: DC
Start: ? — End: 2018-12-26

## 2018-12-26 MED ORDER — LISINOPRIL 20 MG PO TABS
20.00 | ORAL_TABLET | ORAL | Status: DC
Start: 2019-01-01 — End: 2018-12-26

## 2018-12-26 MED ORDER — TRAMADOL HCL 50 MG PO TABS
50.00 | ORAL_TABLET | ORAL | Status: DC
Start: ? — End: 2018-12-26

## 2018-12-26 MED ORDER — ENOXAPARIN SODIUM 40 MG/0.4ML ~~LOC~~ SOLN
40.00 | SUBCUTANEOUS | Status: DC
Start: 2018-12-31 — End: 2018-12-26

## 2018-12-26 MED ORDER — SODIUM CHLORIDE 0.9 % IV SOLN
75.00 | INTRAVENOUS | Status: DC
Start: ? — End: 2018-12-26

## 2018-12-26 MED ORDER — GABAPENTIN 300 MG PO CAPS
900.00 | ORAL_CAPSULE | ORAL | Status: DC
Start: 2019-01-01 — End: 2018-12-26

## 2018-12-26 MED ORDER — GABAPENTIN 400 MG PO CAPS
1200.00 | ORAL_CAPSULE | ORAL | Status: DC
Start: 2018-12-31 — End: 2018-12-26

## 2018-12-27 ENCOUNTER — Other Ambulatory Visit: Payer: Self-pay | Admitting: Sports Medicine

## 2018-12-27 ENCOUNTER — Encounter (HOSPITAL_BASED_OUTPATIENT_CLINIC_OR_DEPARTMENT_OTHER): Admission: RE | Payer: Self-pay | Source: Home / Self Care

## 2018-12-27 ENCOUNTER — Ambulatory Visit (HOSPITAL_BASED_OUTPATIENT_CLINIC_OR_DEPARTMENT_OTHER): Admission: RE | Admit: 2018-12-27 | Payer: 59 | Source: Home / Self Care | Admitting: Orthopedic Surgery

## 2018-12-27 DIAGNOSIS — Z794 Long term (current) use of insulin: Secondary | ICD-10-CM

## 2018-12-27 DIAGNOSIS — E119 Type 2 diabetes mellitus without complications: Secondary | ICD-10-CM

## 2018-12-27 SURGERY — REPAIR, ROTATOR CUFF, ARTHROSCOPIC
Anesthesia: Choice | Laterality: Left

## 2018-12-28 MED ORDER — GENERIC EXTERNAL MEDICATION
Status: DC
Start: ? — End: 2018-12-28

## 2018-12-28 MED ORDER — DICLOFENAC SODIUM 1 % TD GEL
4.00 | TRANSDERMAL | Status: DC
Start: ? — End: 2018-12-28

## 2018-12-30 ENCOUNTER — Encounter: Payer: Self-pay | Admitting: Sports Medicine

## 2018-12-31 MED ORDER — GENERIC EXTERNAL MEDICATION
Status: DC
Start: ? — End: 2018-12-31

## 2018-12-31 MED ORDER — GENERIC EXTERNAL MEDICATION
100.00 | Status: DC
Start: 2018-12-31 — End: 2018-12-31

## 2018-12-31 MED ORDER — DEXAMETHASONE SODIUM PHOSPHATE 4 MG/ML IJ SOLN
6.00 | INTRAMUSCULAR | Status: DC
Start: 2019-01-01 — End: 2018-12-31

## 2018-12-31 MED ORDER — SODIUM CHLORIDE 0.9 % IV SOLN
30.00 | INTRAVENOUS | Status: DC
Start: 2018-12-31 — End: 2018-12-31

## 2019-01-08 ENCOUNTER — Ambulatory Visit: Payer: 59 | Admitting: Sports Medicine

## 2019-01-09 ENCOUNTER — Other Ambulatory Visit: Payer: Self-pay | Admitting: Physician Assistant

## 2019-01-09 DIAGNOSIS — R062 Wheezing: Secondary | ICD-10-CM

## 2019-01-10 NOTE — Telephone Encounter (Signed)
Please advise and send alternative

## 2019-01-11 ENCOUNTER — Other Ambulatory Visit: Payer: Self-pay | Admitting: Sports Medicine

## 2019-02-01 ENCOUNTER — Other Ambulatory Visit: Payer: Self-pay

## 2019-02-01 ENCOUNTER — Ambulatory Visit (INDEPENDENT_AMBULATORY_CARE_PROVIDER_SITE_OTHER): Payer: 59 | Admitting: Sports Medicine

## 2019-02-01 ENCOUNTER — Ambulatory Visit (INDEPENDENT_AMBULATORY_CARE_PROVIDER_SITE_OTHER): Payer: 59

## 2019-02-01 ENCOUNTER — Encounter: Payer: Self-pay | Admitting: Sports Medicine

## 2019-02-01 DIAGNOSIS — M25511 Pain in right shoulder: Secondary | ICD-10-CM

## 2019-02-01 DIAGNOSIS — E119 Type 2 diabetes mellitus without complications: Secondary | ICD-10-CM | POA: Diagnosis not present

## 2019-02-01 DIAGNOSIS — J1289 Other viral pneumonia: Secondary | ICD-10-CM | POA: Diagnosis not present

## 2019-02-01 DIAGNOSIS — U071 COVID-19: Secondary | ICD-10-CM

## 2019-02-01 DIAGNOSIS — G8929 Other chronic pain: Secondary | ICD-10-CM

## 2019-02-01 DIAGNOSIS — Z794 Long term (current) use of insulin: Secondary | ICD-10-CM

## 2019-02-01 DIAGNOSIS — D696 Thrombocytopenia, unspecified: Secondary | ICD-10-CM

## 2019-02-01 MED ORDER — DICLOFENAC SODIUM 1 % TD GEL: 2.0000 g | Freq: Four times a day (QID) | TRANSDERMAL | 0 refills | Status: DC

## 2019-02-01 NOTE — Progress Notes (Addendum)
Subjective:    CC: Hospital follow-up  HPI: Robert Hines is a pleasant 63 year old male with multiple medical problems, he was hospitalized a month ago for bilateral COVID pneumonia.  He turned the corner with Decadron and Remdesivir.  He did have a bit of hyponatremia.  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  I reviewed the past medical history, family history, social history, surgical history, and allergies today and no changes were needed.  Please see the problem list section below in epic for further details.  Past Medical History: Past Medical History:  Diagnosis Date  . Arthritis    back  . Depression   . Diabetes mellitus without complication (HCC)   . Hyperlipidemia   . Hypertension   . Renal disorder    calculi, multiple lithotripsies, stents  . Rotator cuff disorder   . Spinal pain    Past Surgical History: Past Surgical History:  Procedure Laterality Date  . ANTERIOR FUSION CERVICAL SPINE    . FOOT SURGERY    . HERNIA REPAIR    . LITHOTRIPSY    . SHOULDER ARTHROSCOPY WITH ROTATOR CUFF REPAIR AND SUBACROMIAL DECOMPRESSION Right 12/28/2015   Procedure: SHOULDER ARTHROSCOPY TAKE DOWN ROTATOR CUFF REPAIR AND SUBACROMIAL DECOMPRESSION, DEBRIDEMENT OF SLAP TEAR;  Surgeon: Jones BroomJustin Chandler, MD;  Location: [facility];  Service: Orthopedics;  Laterality: Right;  SHOULDER ARTHROSCOPY TAKE DOWN ROTATOR CUFF REPAIR AND SUBACROMIAL DECOMPRESSION  . VASECTOMY Bilateral    Social History: Social History   Socioeconomic History  . Marital status: Married    Spouse name: Not on file  . Number of children: Not on file  . Years of education: Not on file  . Highest education level: Not on file  Occupational History  . Not on file  Social Needs  . Financial resource strain: Not on file  . Food insecurity    Worry: Not on file    Inability: Not on file  . Transportation needs    Medical:  Not on file    Non-medical: Not on file  Tobacco Use  . Smoking status: Former Smoker    Types: Cigarettes    Quit date: 10/01/2018    Years since quitting: 0.3  . Smokeless tobacco: Never Used  Substance and Sexual Activity  . Alcohol use: Yes    Alcohol/week: 0.0 standard drinks    Comment: social  . Drug use: No  . Sexual activity: Yes    Birth control/protection: None  Lifestyle  . Physical activity    Days per week: Not on file    Minutes per session: Not on file  . Stress: Not on file  Relationships  . Social Musicianconnections    Talks on phone: Not on file    Gets together: Not on file    Attends religious service: Not on file    Active member of club or organization: Not on file    Attends meetings of clubs or organizations: Not on file    Relationship status: Not on file  Other Topics Concern  . Not on file  Social History Narrative  . Not on file   Family History: Family History  Problem Relation Age of Onset  . Hypertension Mother   . Heart failure Father   . Diabetes Father   . Hypertension Father   . Heart failure Sister   . Hypertension Sister    Allergies: Allergies  Allergen Reactions  . Codeine Diarrhea and Nausea Only  . Duloxetine Diarrhea and Nausea Only   Medications: See med rec.  Review of Systems: No fevers, chills, night sweats, weight loss, chest pain, or shortness of breath.   Objective:    General: Well Developed, well nourished, and in no acute distress.  Neuro: Alert and oriented x3, extra-ocular muscles intact, sensation grossly intact.  HEENT: Normocephalic, atraumatic, pupils equal round reactive to light, neck supple, no masses, no lymphadenopathy, thyroid nonpalpable.  Skin: Warm and dry, no rashes. Cardiac: Regular rate and rhythm, no murmurs rubs or gallops, no lower extremity edema.  Respiratory: Clear to auscultation bilaterally. Not using accessory muscles, speaking in full sentences.  Impression and Recommendations:     Pneumonia due to COVID-19 virus Robert Hines spent 9 days in the hospital with bilateral COVID pneumonia. He was able to turn the corner with Decadron and Redesivir. He has improved considerably, his breathing is back to baseline. He did have some hyponatremia so we will recheck his labs today. 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 If his labs are normalized I am going to clear him for his shoulder surgery.  Thrombocytopenia (HCC) No clinical evidence of bleeding. Recent COVID pneumonia. We can recheck this in 2 weeks.   ___________________________________________ Gwen Her. Dianah Field, M.D., ABFM., CAQSM. Primary Care and Sports Medicine [facility]  Adjunct Professor of [employer]

## 2019-02-01 NOTE — Assessment & Plan Note (Addendum)
Robert Hines spent 9 days in the hospital with bilateral COVID pneumonia. He was able to turn the corner with Decadron and Redesivir. He has improved considerably, his breathing is back to baseline. He did have some hyponatremia so we will recheck his labs today. 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 If his labs are normalized I am going to clear him for his shoulder surgery.

## 2019-02-02 LAB — COMPLETE METABOLIC PANEL WITH GFR
AG Ratio: 1.5 (calc) (ref 1.0–2.5)
ALT: 33 U/L (ref 9–46)
AST: 21 U/L (ref 10–35)
Albumin: 3.9 g/dL (ref 3.6–5.1)
Alkaline phosphatase (APISO): 72 U/L (ref 35–144)
BUN: 15 mg/dL (ref 7–25)
CO2: 26 mmol/L (ref 20–32)
Calcium: 9.6 mg/dL (ref 8.6–10.3)
Chloride: 100 mmol/L (ref 98–110)
Creat: 0.93 mg/dL (ref 0.70–1.25)
GFR, Est African American: 100 mL/min/{1.73_m2} (ref >=60)
GFR, Est Non African American: 86 mL/min/{1.73_m2} (ref >=60)
Globulin: 2.6 g/dL (calc) (ref 1.9–3.7)
Glucose, Bld: 215 mg/dL — ABNORMAL HIGH (ref 65–99)
Potassium: 4.5 mmol/L (ref 3.5–5.3)
Sodium: 138 mmol/L (ref 135–146)
Total Bilirubin: 0.3 mg/dL (ref 0.2–1.2)
Total Protein: 6.5 g/dL (ref 6.1–8.1)

## 2019-02-02 LAB — CBC
HCT: 42.4 % (ref 38.5–50.0)
Hemoglobin: 14.3 g/dL (ref 13.2–17.1)
MCH: 33.3 pg — ABNORMAL HIGH (ref 27.0–33.0)
MCHC: 33.7 g/dL (ref 32.0–36.0)
MCV: 98.6 fL (ref 80.0–100.0)
MPV: 11.4 fL (ref 7.5–12.5)
Platelets: 130 10*3/uL — ABNORMAL LOW (ref 140–400)
RBC: 4.3 10*6/uL (ref 4.20–5.80)
RDW: 14.5 % (ref 11.0–15.0)
WBC: 5.9 10*3/uL (ref 3.8–10.8)

## 2019-02-02 LAB — HEMOGLOBIN A1C
Hgb A1c MFr Bld: 8.6 % of total Hgb — ABNORMAL HIGH (ref <5.7)
Mean Plasma Glucose: 200 (calc)
eAG (mmol/L): 11.1 (calc)

## 2019-02-03 DIAGNOSIS — D696 Thrombocytopenia, unspecified: Secondary | ICD-10-CM | POA: Insufficient documentation

## 2019-02-03 NOTE — Assessment & Plan Note (Signed)
No clinical evidence of bleeding. Recent COVID pneumonia. We can recheck this in 2 weeks.

## 2019-02-03 NOTE — Addendum Note (Signed)
Addended by: Silverio Decamp on: 02/03/2019 05:36 PM   Modules accepted: Orders

## 2019-02-15 ENCOUNTER — Encounter: Payer: Self-pay | Admitting: Sports Medicine

## 2019-02-15 ENCOUNTER — Other Ambulatory Visit: Payer: Self-pay

## 2019-02-15 ENCOUNTER — Ambulatory Visit (INDEPENDENT_AMBULATORY_CARE_PROVIDER_SITE_OTHER): Payer: 59 | Admitting: Sports Medicine

## 2019-02-15 DIAGNOSIS — M25511 Pain in right shoulder: Secondary | ICD-10-CM

## 2019-02-15 DIAGNOSIS — G8929 Other chronic pain: Secondary | ICD-10-CM

## 2019-02-15 DIAGNOSIS — Z794 Long term (current) use of insulin: Secondary | ICD-10-CM

## 2019-02-15 DIAGNOSIS — I878 Other specified disorders of veins: Secondary | ICD-10-CM | POA: Diagnosis not present

## 2019-02-15 DIAGNOSIS — D696 Thrombocytopenia, unspecified: Secondary | ICD-10-CM

## 2019-02-15 DIAGNOSIS — M25512 Pain in left shoulder: Secondary | ICD-10-CM

## 2019-02-15 DIAGNOSIS — E119 Type 2 diabetes mellitus without complications: Secondary | ICD-10-CM | POA: Diagnosis not present

## 2019-02-15 MED ORDER — TRAMADOL HCL 50 MG PO TABS: 100.0000 mg | ORAL_TABLET | Freq: Three times a day (TID) | ORAL | 0 refills | Status: DC | PRN

## 2019-02-15 NOTE — Assessment & Plan Note (Signed)
Blood sugars look fantastic, 80-120. We are going to check his A1c again in the hopes that the average has decreased enough for it to be less than 7.5%. I think I would still clear him for surgery considering his current CBGs.

## 2019-02-15 NOTE — Assessment & Plan Note (Signed)
Canceled left arthroscopic rotator cuff repair with Dr. Tamera Punt secondary to COVID pneumonia. His A1c needs to be less than 7.5% before we clear him for surgery, rechecking today. I am going to give him a bunch of tramadol, he needs some pain relief in the meantime.

## 2019-02-15 NOTE — Assessment & Plan Note (Signed)
No clinical bleeding, recent COVID pneumonia, last platelets were in the 130s, rechecking today.

## 2019-02-15 NOTE — Progress Notes (Signed)
Subjective:    CC: Follow-up  HPI: Thrombocytopenia: [name] returns, he had COVID pneumonia, he had a bit of mild thrombocytopenia without any clinical bleeding.  We asked him to come back to recheck his platelets today.    Rotator cuff tear: He also has severe left shoulder pain from a rotator cuff tear and was scheduled for an arthroscopic rotator cuff repair, this was canceled due to his COVID pneumonia.    Diabetes mellitus type 2: His A1c needs to be less than 7.5% to be safe for surgery, it was too high at the last check.  More recently he has been doing far better job of controlling his blood sugars and is eating, his blood sugars have been running between 80 and 120.  I reviewed the past medical history, family history, social history, surgical history, and allergies today and no changes were needed.  Please see the problem list section below in epic for further details.  Past Medical History: Past Medical History:  Diagnosis Date  . Arthritis    back  . Depression   . Diabetes mellitus without complication (HCC)   . Hyperlipidemia   . Hypertension   . Renal disorder    calculi, multiple lithotripsies, stents  . Rotator cuff disorder   . Spinal pain    Past Surgical History: Past Surgical History:  Procedure Laterality Date  . ANTERIOR FUSION CERVICAL SPINE    . FOOT SURGERY    . HERNIA REPAIR    . LITHOTRIPSY    . SHOULDER ARTHROSCOPY WITH ROTATOR CUFF REPAIR AND SUBACROMIAL DECOMPRESSION Right 12/28/2015   Procedure: SHOULDER ARTHROSCOPY TAKE DOWN ROTATOR CUFF REPAIR AND SUBACROMIAL DECOMPRESSION, DEBRIDEMENT OF SLAP TEAR;  Surgeon: Jones BroomJustin Chandler, MD;  Location: [facility];  Service: Orthopedics;  Laterality: Right;  SHOULDER ARTHROSCOPY TAKE DOWN ROTATOR CUFF REPAIR AND SUBACROMIAL DECOMPRESSION  . VASECTOMY Bilateral    Social History: Social History   Socioeconomic History  . Marital status: Married    Spouse name: Not on file  . Number of  children: Not on file  . Years of education: Not on file  . Highest education level: Not on file  Occupational History  . Not on file  Social Needs  . Financial resource strain: Not on file  . Food insecurity    Worry: Not on file    Inability: Not on file  . Transportation needs    Medical: Not on file    Non-medical: Not on file  Tobacco Use  . Smoking status: Former Smoker    Types: Cigarettes    Quit date: 10/01/2018    Years since quitting: 0.3  . Smokeless tobacco: Never Used  Substance and Sexual Activity  . Alcohol use: Yes    Alcohol/week: 0.0 standard drinks    Comment: social  . Drug use: No  . Sexual activity: Yes    Birth control/protection: None  Lifestyle  . Physical activity    Days per week: Not on file    Minutes per session: Not on file  . Stress: Not on file  Relationships  . Social Musicianconnections    Talks on phone: Not on file    Gets together: Not on file    Attends religious service: Not on file    Active member of club or organization: Not on file    Attends meetings of clubs or organizations: Not on file    Relationship status: Not on file  Other Topics Concern  . Not on file  Social History Narrative  . Not on file   Family History: Family History  Problem Relation Age of Onset  . Hypertension Mother   . Heart failure Father   . Diabetes Father   . Hypertension Father   . Heart failure Sister   . Hypertension Sister    Allergies: Allergies  Allergen Reactions  . Codeine Diarrhea and Nausea Only  . Duloxetine Diarrhea and Nausea Only   Medications: See med rec.  Review of Systems: No fevers, chills, night sweats, weight loss, chest pain, or shortness of breath.   Objective:    General: Well Developed, well nourished, and in no acute distress.  Neuro: Alert and oriented x3, extra-ocular muscles intact, sensation grossly intact.  HEENT: Normocephalic, atraumatic, pupils equal round reactive to light, neck supple, no masses, no  lymphadenopathy, thyroid nonpalpable.  Skin: Warm and dry, no rashes. Cardiac: Regular rate and rhythm, no murmurs rubs or gallops, no lower extremity edema.  Respiratory: Clear to auscultation bilaterally. Not using accessory muscles, speaking in full sentences.  Venipuncture performed in the right cubital vein by physician.  Impression and Recommendations:    Diabetes mellitus, type 2 Blood sugars look fantastic, 80-120. We are going to check his A1c again in the hopes that the average has decreased enough for it to be less than 7.5%. I think I would still clear him for surgery considering his current CBGs.  Thrombocytopenia (HCC) No clinical bleeding, recent COVID pneumonia, last platelets were in the 130s, rechecking today.  Left shoulder pain Canceled left arthroscopic rotator cuff repair with Dr. Tamera Punt secondary to COVID pneumonia. His A1c needs to be less than 7.5% before we clear him for surgery, rechecking today. I am going to give him a bunch of tramadol, he needs some pain relief in the meantime.   ___________________________________________ Gwen Her. Dianah Field, M.D., ABFM., CAQSM. Primary Care and Sports Medicine [facility]  Adjunct Professor of [employer] [facility] [employer] [employer]

## 2019-02-19 LAB — CBC
HCT: 47.1 % (ref 38.5–50.0)
Hemoglobin: 14.9 g/dL (ref 13.2–17.1)
MCH: 32.7 pg (ref 27.0–33.0)
MCHC: 31.6 g/dL — ABNORMAL LOW (ref 32.0–36.0)
MCV: 103.5 fL — ABNORMAL HIGH (ref 80.0–100.0)
MPV: 10.7 fL (ref 7.5–12.5)
Platelets: 193 10*3/uL (ref 140–400)
RBC: 4.55 10*6/uL (ref 4.20–5.80)
RDW: 15.7 % — ABNORMAL HIGH (ref 11.0–15.0)
WBC: 6.1 10*3/uL (ref 3.8–10.8)

## 2019-02-19 LAB — HEMOGLOBIN A1C
Hgb A1c MFr Bld: 7.9 % of total Hgb — ABNORMAL HIGH (ref <5.7)
Mean Plasma Glucose: 180 (calc)
eAG (mmol/L): 10 (calc)

## 2019-02-27 ENCOUNTER — Telehealth: Payer: Self-pay | Admitting: Sports Medicine

## 2019-02-27 NOTE — Telephone Encounter (Signed)
Nurse visit is fine.  I know it is early but as soon as it is below 7.5% he is cleared for surgery.

## 2019-02-27 NOTE — Telephone Encounter (Signed)
Patient came in 2 weeks ago and had an A1C test run and it was elevated. He wants to know if he would need to come back in for a nurse visit so that he can be cleared for surgery or does he need to see you? Please advise.

## 2019-02-28 ENCOUNTER — Other Ambulatory Visit: Payer: Self-pay | Admitting: *Deleted

## 2019-02-28 DIAGNOSIS — I10 Essential (primary) hypertension: Secondary | ICD-10-CM

## 2019-02-28 MED ORDER — SIMVASTATIN 40 MG PO TABS: 40.0000 mg | ORAL_TABLET | Freq: Every day | ORAL | 3 refills | Status: DC

## 2019-02-28 MED ORDER — GABAPENTIN 300 MG PO CAPS: ORAL_CAPSULE | ORAL | 3 refills | Status: DC

## 2019-02-28 MED ORDER — LISINOPRIL 20 MG PO TABS: 20.0000 mg | ORAL_TABLET | Freq: Every day | ORAL | 3 refills | Status: DC

## 2019-02-28 NOTE — Telephone Encounter (Signed)
Left brief VM that patient that he could call back and schedule a nurse visit.

## 2019-03-01 NOTE — Telephone Encounter (Signed)
Patient has a nurse visit on 03/07/2019. The form is at Dr. Darene Lamer desk and can be filled out if the A1C is lower that 7.5.

## 2019-03-07 ENCOUNTER — Other Ambulatory Visit: Payer: Self-pay

## 2019-03-07 ENCOUNTER — Ambulatory Visit (INDEPENDENT_AMBULATORY_CARE_PROVIDER_SITE_OTHER): Payer: 59 | Admitting: Family Medicine

## 2019-03-07 VITALS — BP 125/69 | HR 90 | Ht 73.0 in | Wt 235.0 lb

## 2019-03-07 DIAGNOSIS — Z794 Long term (current) use of insulin: Secondary | ICD-10-CM

## 2019-03-07 DIAGNOSIS — E119 Type 2 diabetes mellitus without complications: Secondary | ICD-10-CM

## 2019-03-07 LAB — POCT GLYCOSYLATED HEMOGLOBIN (HGB A1C): Hemoglobin A1C: 7.5 % — AB (ref 4.0–5.6)

## 2019-03-07 NOTE — Progress Notes (Signed)
Pt came into clinic for a1c testing. For surgical clearance a value of less than 7.5 is required. Form placed in PCP's box for review.

## 2019-03-11 ENCOUNTER — Other Ambulatory Visit: Payer: Self-pay | Admitting: Orthopedic Surgery

## 2019-03-13 ENCOUNTER — Other Ambulatory Visit: Payer: Self-pay | Admitting: *Deleted

## 2019-03-13 MED ORDER — ZOLPIDEM TARTRATE 10 MG PO TABS: ORAL_TABLET | ORAL | 0 refills | Status: DC

## 2019-03-18 ENCOUNTER — Encounter (HOSPITAL_BASED_OUTPATIENT_CLINIC_OR_DEPARTMENT_OTHER): Payer: Self-pay | Admitting: *Deleted

## 2019-03-18 ENCOUNTER — Other Ambulatory Visit: Payer: Self-pay

## 2019-03-18 ENCOUNTER — Other Ambulatory Visit (HOSPITAL_COMMUNITY)
Admission: RE | Admit: 2019-03-18 | Discharge: 2019-03-18 | Disposition: A | Discharge status: Home / Self Care | Payer: 59 | Source: Ambulatory Visit | Attending: Orthopedic Surgery | Admitting: Orthopedic Surgery

## 2019-03-18 DIAGNOSIS — Z20828 Contact with and (suspected) exposure to other viral communicable diseases: Secondary | ICD-10-CM | POA: Diagnosis not present

## 2019-03-18 DIAGNOSIS — Z01812 Encounter for preprocedural laboratory examination: Secondary | ICD-10-CM | POA: Insufficient documentation

## 2019-03-18 DIAGNOSIS — M7541 Impingement syndrome of right shoulder: Secondary | ICD-10-CM | POA: Diagnosis not present

## 2019-03-18 NOTE — Progress Notes (Addendum)
Spoke w/ via phone for pre-op interview Robert Hines  Lab needs dos--- istat 8  ekg (requested from Robert Hines, did not receive)           Lab results------ heamglobin a1c 03-07-2019 on chart Medical clearance Robert Robert Hines 03-08-2019 on chart COVID test --- 03-18-2019 Arrive at -------730 NPO after ---630 am (eras) Medications to take morning of surgery -----tramadol prn, gabapentin, bupropion, simvastatin  Diabetic medication -----take 1/2 dose glargine insulin hs 03-20-2019 Patient Special Instructions ----- Pre-Op special Istructions ----- Patient verbalized understanding of instructions that were given at this phone interview. Patient denies shortness of breath, chest pain, fever, cough a this phone interview.  Anesthesia Review:patient ok for surgery center per Robert Hines  PCP: Robert Robert Hines Cardiologist :none Chest x-ray :chest xray 02-02-2019 epic EKG :none Echo :none Cardiac Cath : none Sleep Study/ CPAP :none Fasting Blood Sugar : 140     / Checks Blood Sugar -- times a day:  Once a day  Blood Thinner/ Instructions /Last Dose:none ASA / Instructions/ Last Dose : 81 mg aspirin, patient to call Robert Hines for instructions  Patient denies shortness of breath, chest pain, fever, and cough at this phone interview.

## 2019-03-19 LAB — NOVEL CORONAVIRUS, NAA (HOSP ORDER, SEND-OUT TO REF LAB; TAT 18-24 HRS): SARS-CoV-2, NAA: NOT DETECTED

## 2019-03-21 ENCOUNTER — Encounter (HOSPITAL_BASED_OUTPATIENT_CLINIC_OR_DEPARTMENT_OTHER): Payer: Self-pay | Admitting: *Deleted

## 2019-03-21 ENCOUNTER — Other Ambulatory Visit: Payer: Self-pay

## 2019-03-21 ENCOUNTER — Ambulatory Visit (HOSPITAL_BASED_OUTPATIENT_CLINIC_OR_DEPARTMENT_OTHER): Payer: 59 | Admitting: Physician Assistant

## 2019-03-21 ENCOUNTER — Encounter (HOSPITAL_BASED_OUTPATIENT_CLINIC_OR_DEPARTMENT_OTHER)
Admission: RE | Disposition: A | Discharge status: Home / Self Care | Payer: Self-pay | Source: Home / Self Care | Attending: Orthopedic Surgery

## 2019-03-21 ENCOUNTER — Ambulatory Visit (HOSPITAL_BASED_OUTPATIENT_CLINIC_OR_DEPARTMENT_OTHER)
Admission: RE | Admit: 2019-03-21 | Discharge: 2019-03-21 | Disposition: A | Discharge status: Home / Self Care | Payer: 59 | Attending: Orthopedic Surgery | Admitting: Orthopedic Surgery

## 2019-03-21 DIAGNOSIS — M7502 Adhesive capsulitis of left shoulder: Secondary | ICD-10-CM | POA: Insufficient documentation

## 2019-03-21 DIAGNOSIS — M75102 Unspecified rotator cuff tear or rupture of left shoulder, not specified as traumatic: Secondary | ICD-10-CM | POA: Insufficient documentation

## 2019-03-21 DIAGNOSIS — E669 Obesity, unspecified: Secondary | ICD-10-CM | POA: Diagnosis not present

## 2019-03-21 DIAGNOSIS — M25812 Other specified joint disorders, left shoulder: Secondary | ICD-10-CM | POA: Insufficient documentation

## 2019-03-21 DIAGNOSIS — F329 Major depressive disorder, single episode, unspecified: Secondary | ICD-10-CM | POA: Insufficient documentation

## 2019-03-21 DIAGNOSIS — Z6831 Body mass index (BMI) 31.0-31.9, adult: Secondary | ICD-10-CM | POA: Diagnosis not present

## 2019-03-21 DIAGNOSIS — I10 Essential (primary) hypertension: Secondary | ICD-10-CM | POA: Insufficient documentation

## 2019-03-21 DIAGNOSIS — Z87891 Personal history of nicotine dependence: Secondary | ICD-10-CM | POA: Insufficient documentation

## 2019-03-21 DIAGNOSIS — Z794 Long term (current) use of insulin: Secondary | ICD-10-CM | POA: Insufficient documentation

## 2019-03-21 DIAGNOSIS — E119 Type 2 diabetes mellitus without complications: Secondary | ICD-10-CM | POA: Diagnosis not present

## 2019-03-21 DIAGNOSIS — Z79899 Other long term (current) drug therapy: Secondary | ICD-10-CM | POA: Insufficient documentation

## 2019-03-21 DIAGNOSIS — M25512 Pain in left shoulder: Secondary | ICD-10-CM | POA: Diagnosis present

## 2019-03-21 DIAGNOSIS — Z86718 Personal history of other venous thrombosis and embolism: Secondary | ICD-10-CM | POA: Insufficient documentation

## 2019-03-21 DIAGNOSIS — E785 Hyperlipidemia, unspecified: Secondary | ICD-10-CM | POA: Diagnosis not present

## 2019-03-21 HISTORY — DX: Unspecified cataract: H26.9

## 2019-03-21 HISTORY — DX: Pneumonia, unspecified organism: J18.9

## 2019-03-21 HISTORY — PX: ARTHOSCOPIC ROTAOR CUFF REPAIR: SHX5002

## 2019-03-21 HISTORY — PX: SHOULDER ARTHROSCOPY WITH SUBACROMIAL DECOMPRESSION: SHX5684

## 2019-03-21 LAB — POCT I-STAT, CHEM 8
BUN: 18 mg/dL (ref 8–23)
Calcium, Ion: 1.22 mmol/L (ref 1.15–1.40)
Chloride: 101 mmol/L (ref 98–111)
Creatinine, Ser: 0.8 mg/dL (ref 0.61–1.24)
Glucose, Bld: 126 mg/dL — ABNORMAL HIGH (ref 70–99)
HCT: 46 % (ref 39.0–52.0)
Hemoglobin: 15.6 g/dL (ref 13.0–17.0)
Potassium: 3.9 mmol/L (ref 3.5–5.1)
Sodium: 140 mmol/L (ref 135–145)
TCO2: 24 mmol/L (ref 22–32)

## 2019-03-21 LAB — GLUCOSE, CAPILLARY: Glucose-Capillary: 110 mg/dL — ABNORMAL HIGH (ref 70–99)

## 2019-03-21 SURGERY — REPAIR, ROTATOR CUFF, ARTHROSCOPIC
Anesthesia: Regional | Site: Shoulder | Laterality: Left

## 2019-03-21 MED ORDER — MIDAZOLAM HCL 2 MG/2ML IJ SOLN
0.5000 mg | INTRAMUSCULAR | Status: DC | PRN
  Administered 2019-03-21: 09:00:00 2 mg via INTRAVENOUS
  Filled 2019-03-21: qty 2

## 2019-03-21 MED ORDER — LIDOCAINE 2% (20 MG/ML) 5 ML SYRINGE
INTRAMUSCULAR | Status: AC
  Filled 2019-03-21: qty 5

## 2019-03-21 MED ORDER — CHLORHEXIDINE GLUCONATE 4 % EX LIQD
60.0000 mL | Freq: Once | CUTANEOUS | Status: DC
  Filled 2019-03-21: qty 118

## 2019-03-21 MED ORDER — PROPOFOL 10 MG/ML IV BOLUS
INTRAVENOUS | Status: AC
  Filled 2019-03-21: qty 20

## 2019-03-21 MED ORDER — FENTANYL CITRATE (PF) 100 MCG/2ML IJ SOLN
50.0000 ug | INTRAMUSCULAR | Status: DC | PRN
  Administered 2019-03-21: 100 ug via INTRAVENOUS
  Administered 2019-03-21: 50 ug via INTRAVENOUS
  Filled 2019-03-21: qty 1

## 2019-03-21 MED ORDER — ROCURONIUM BROMIDE 10 MG/ML (PF) SYRINGE
PREFILLED_SYRINGE | INTRAVENOUS | Status: DC | PRN
  Administered 2019-03-21: 50 mg via INTRAVENOUS

## 2019-03-21 MED ORDER — MIDAZOLAM HCL 2 MG/2ML IJ SOLN
INTRAMUSCULAR | Status: AC
  Filled 2019-03-21: qty 2

## 2019-03-21 MED ORDER — FENTANYL CITRATE (PF) 100 MCG/2ML IJ SOLN
INTRAMUSCULAR | Status: AC
  Filled 2019-03-21: qty 2

## 2019-03-21 MED ORDER — OXYCODONE-ACETAMINOPHEN 5-325 MG PO TABS: ORAL_TABLET | ORAL | 0 refills | Status: DC

## 2019-03-21 MED ORDER — DEXAMETHASONE SODIUM PHOSPHATE 10 MG/ML IJ SOLN
INTRAMUSCULAR | Status: AC
  Filled 2019-03-21: qty 1

## 2019-03-21 MED ORDER — SUGAMMADEX SODIUM 200 MG/2ML IV SOLN
INTRAVENOUS | Status: DC | PRN
  Administered 2019-03-21: 200 mg via INTRAVENOUS

## 2019-03-21 MED ORDER — TIZANIDINE HCL 4 MG PO TABS: 4.0000 mg | ORAL_TABLET | Freq: Three times a day (TID) | ORAL | 1 refills | Status: DC | PRN

## 2019-03-21 MED ORDER — PROPOFOL 10 MG/ML IV BOLUS
INTRAVENOUS | Status: DC | PRN
  Administered 2019-03-21: 120 mg via INTRAVENOUS

## 2019-03-21 MED ORDER — CEFAZOLIN SODIUM-DEXTROSE 2-4 GM/100ML-% IV SOLN
INTRAVENOUS | Status: AC
  Filled 2019-03-21: qty 100

## 2019-03-21 MED ORDER — FENTANYL CITRATE (PF) 100 MCG/2ML IJ SOLN
25.0000 ug | INTRAMUSCULAR | Status: DC | PRN
  Filled 2019-03-21: qty 1

## 2019-03-21 MED ORDER — CEFAZOLIN SODIUM-DEXTROSE 2-4 GM/100ML-% IV SOLN
2.0000 g | INTRAVENOUS | Status: AC
  Administered 2019-03-21: 2 g via INTRAVENOUS
  Filled 2019-03-21: qty 100

## 2019-03-21 MED ORDER — ONDANSETRON HCL 4 MG/2ML IJ SOLN
INTRAMUSCULAR | Status: AC
  Filled 2019-03-21: qty 2

## 2019-03-21 MED ORDER — ROCURONIUM BROMIDE 10 MG/ML (PF) SYRINGE
PREFILLED_SYRINGE | INTRAVENOUS | Status: AC
  Filled 2019-03-21: qty 10

## 2019-03-21 MED ORDER — LIDOCAINE 2% (20 MG/ML) 5 ML SYRINGE
INTRAMUSCULAR | Status: DC | PRN
  Administered 2019-03-21: 20 mg via INTRAVENOUS

## 2019-03-21 MED ORDER — SUCCINYLCHOLINE CHLORIDE 200 MG/10ML IV SOSY
PREFILLED_SYRINGE | INTRAVENOUS | Status: AC
  Filled 2019-03-21: qty 10

## 2019-03-21 MED ORDER — PROMETHAZINE HCL 25 MG/ML IJ SOLN
6.2500 mg | INTRAMUSCULAR | Status: DC | PRN
  Filled 2019-03-21: qty 1

## 2019-03-21 MED ORDER — KETOROLAC TROMETHAMINE 15 MG/ML IJ SOLN
15.0000 mg | Freq: Once | INTRAMUSCULAR | Status: DC | PRN
  Filled 2019-03-21: qty 1

## 2019-03-21 MED ORDER — ONDANSETRON HCL 4 MG/2ML IJ SOLN
INTRAMUSCULAR | Status: DC | PRN
  Administered 2019-03-21: 4 mg via INTRAVENOUS

## 2019-03-21 MED ORDER — SODIUM CHLORIDE 0.9 % IR SOLN
Status: DC | PRN
  Administered 2019-03-21: 6000 mL

## 2019-03-21 MED ORDER — LACTATED RINGERS IV SOLN
INTRAVENOUS | Status: DC
  Administered 2019-03-21 (×2): via INTRAVENOUS
  Filled 2019-03-21: qty 1000

## 2019-03-21 MED ORDER — BUPIVACAINE LIPOSOME 1.3 % IJ SUSP
INTRAMUSCULAR | Status: DC | PRN
  Administered 2019-03-21: 10 mL via PERINEURAL

## 2019-03-21 MED ORDER — ROPIVACAINE HCL 5 MG/ML IJ SOLN
INTRAMUSCULAR | Status: DC | PRN
  Administered 2019-03-21: 10 mL via PERINEURAL

## 2019-03-21 SURGICAL SUPPLY — 59 items
ANCHOR PEEK 4.75X19.1 SWLK C (Anchor) ×6 IMPLANT
BNDG COHESIVE 4X5 TAN STRL (GAUZE/BANDAGES/DRESSINGS) ×3 IMPLANT
BURR OVAL 8 FLU 4.0MM X 13CM (MISCELLANEOUS) ×1
BURR OVAL 8 FLU 4.0X13 (MISCELLANEOUS) ×2 IMPLANT
CANNULA 5.75X7 CRYSTAL CLEAR (CANNULA) ×3 IMPLANT
CANNULA TWIST IN 8.25X7CM (CANNULA) ×3 IMPLANT
CHLORAPREP W/TINT 26 (MISCELLANEOUS) ×3 IMPLANT
COVER WAND RF STERILE (DRAPES) ×3 IMPLANT
CUTTER BONE 4.0MM X 13CM (MISCELLANEOUS) ×3 IMPLANT
DECANTER SPIKE VIAL GLASS SM (MISCELLANEOUS) IMPLANT
DRAPE INCISE IOBAN 66X45 STRL (DRAPES) ×3 IMPLANT
DRAPE LG THREE QUARTER DISP (DRAPES) IMPLANT
DRAPE ORTHO SPLIT 87X125 STRL (DRAPES) ×6 IMPLANT
DRAPE STERI 35X30 U-POUCH (DRAPES) ×3 IMPLANT
DRAPE SURG 17X23 STRL (DRAPES) ×3 IMPLANT
DRAPE U-SHAPE 47X51 STRL (DRAPES) ×3 IMPLANT
DRAPE U-SHAPE 76X120 STRL (DRAPES) ×6 IMPLANT
DRSG PAD ABDOMINAL 8X10 ST (GAUZE/BANDAGES/DRESSINGS) ×3 IMPLANT
ELECT REM PT RETURN 9FT ADLT (ELECTROSURGICAL)
ELECTRODE REM PT RTRN 9FT ADLT (ELECTROSURGICAL) IMPLANT
GAUZE SPONGE 4X4 12PLY STRL (GAUZE/BANDAGES/DRESSINGS) ×3 IMPLANT
GAUZE SPONGE 4X4 12PLY STRL LF (GAUZE/BANDAGES/DRESSINGS) ×3 IMPLANT
GAUZE XEROFORM 1X8 LF (GAUZE/BANDAGES/DRESSINGS) ×3 IMPLANT
GLOVE BIO SURGEON STRL SZ7 (GLOVE) ×6 IMPLANT
GLOVE BIO SURGEON STRL SZ7.5 (GLOVE) ×6 IMPLANT
GLOVE BIOGEL PI IND STRL 7.0 (GLOVE) ×1 IMPLANT
GLOVE BIOGEL PI IND STRL 8 (GLOVE) ×2 IMPLANT
GLOVE BIOGEL PI INDICATOR 7.0 (GLOVE) ×2
GLOVE BIOGEL PI INDICATOR 8 (GLOVE) ×4
GOWN STRL REUS W/ TWL LRG LVL3 (GOWN DISPOSABLE) ×2 IMPLANT
GOWN STRL REUS W/TWL LRG LVL3 (GOWN DISPOSABLE) ×4
MANIFOLD NEPTUNE II (INSTRUMENTS) ×3 IMPLANT
NDL SUT 6 .5 CRC .975X.05 MAYO (NEEDLE) IMPLANT
NEEDLE MAYO 6 CRC TAPER PT (NEEDLE) IMPLANT
NEEDLE MAYO CATGUT SZ4 (NEEDLE) IMPLANT
NEEDLE MAYO TAPER (NEEDLE)
NEEDLE SCORPION MULTI FIRE (NEEDLE) ×3 IMPLANT
NS IRRIG 1000ML POUR BTL (IV SOLUTION) IMPLANT
PACK ARTHROSCOPY DSU (CUSTOM PROCEDURE TRAY) ×3 IMPLANT
PACK BASIN DAY SURGERY FS (CUSTOM PROCEDURE TRAY) ×3 IMPLANT
PAD ABD 8X10 STRL (GAUZE/BANDAGES/DRESSINGS) ×3 IMPLANT
PROBE BIPOLAR ATHRO 135MM 90D (MISCELLANEOUS) IMPLANT
RESTRAINT HEAD UNIVERSAL NS (MISCELLANEOUS) ×3 IMPLANT
SLEEVE SCD COMPRESS KNEE MED (MISCELLANEOUS) ×3 IMPLANT
SLING ARM FOAM STRAP LRG (SOFTGOODS) ×3 IMPLANT
SLING ARM IMMOBILIZER MED (SOFTGOODS) IMPLANT
SLING ARM MED ADULT FOAM STRAP (SOFTGOODS) IMPLANT
SLING ARM XL FOAM STRAP (SOFTGOODS) IMPLANT
SUPPORT WRAP ARM LG (MISCELLANEOUS) ×3 IMPLANT
SUT ETHILON 3 0 PS 1 (SUTURE) ×3 IMPLANT
SUT PDS AB 0 CT 36 (SUTURE) IMPLANT
SUT TIGER TAPE 7 IN WHITE (SUTURE) IMPLANT
SUTURE TAPE 1.3 40 TPR END (SUTURE) ×2 IMPLANT
SUTURETAPE 1.3 40 TPR END (SUTURE) ×6
TAPE CLOTH SURG 6X10 WHT LF (GAUZE/BANDAGES/DRESSINGS) ×3 IMPLANT
TAPE FIBER 2MM 7IN #2 BLUE (SUTURE) IMPLANT
TOWEL OR 17X26 10 PK STRL BLUE (TOWEL DISPOSABLE) ×3 IMPLANT
TUBING ARTHROSCOPY IRRIG 16FT (MISCELLANEOUS) ×3 IMPLANT
TUBING SUCTION 1/4X6FT (MISCELLANEOUS) ×3 IMPLANT

## 2019-03-21 NOTE — Op Note (Signed)
Procedure(s):   AYMAN BRULL male 63 y.o. 03/21/2019  Preoperative diagnosis: #1 left shoulder rotator cuff tear #2 left shoulder impingement  Postoperative diagnosis: #1 and 2 are same #3 is left shoulder adhesive capsulitis  Procedure(s) and Anesthesia Type:    Left ARTHROSCOPIC ROTATOR CUFF REPAIR - Choice    Left SHOULDER ARTHROSCOPY WITH SUBACROMIAL DECOMPRESSION - Choice Left shoulder capsular release for adhesive capsulitis  Surgeon(s) and Role:    Tania Ade, MD - Primary     Surgeon: Isabella Stalling   Assistants: Jeanmarie Hubert PA-C [facility] was present and scrubbed throughout the procedure and was essential in positioning, assisting with the camera and instrumentation,, and closure)  Anesthesia: General endotracheal anesthesia with preoperative interscalene block given by the attending anesthesiologist   Procedure Detail   Estimated Blood Loss: Min         Drains: none  Blood Given: none         Specimens: none        Complications:  * No complications entered in OR log *         Disposition: PACU - hemodynamically stable.         Condition: stable    Procedure:   INDICATIONS FOR SURGERY: The patient is 63 y.o. male who has had a long history of left shoulder pain, failed conservative measures including therapy and injections.  He was noted to have severe tendinopathy and advanced partial tearing in his rotator cuff on MRI.  Indicated for surgical treatment to decrease pain and restore function.  OPERATIVE FINDINGS: Examination under anesthesia: He was noted to have severe stiffness on exam with range of motion limited 10 degrees external rotation and 90 degrees forward flexion.    DESCRIPTION OF PROCEDURE: The patient was identified in preoperative  holding area where I personally marked the operative site after  verifying site, side, and procedure with the patient. An interscalene block was given by the attending  anesthesiologist the holding area.  The patient was taken back to the operating room where general anesthesia was induced without complication and was placed in the beach-chair position with the back  elevated about 60 degrees and all extremities and head and neck carefully padded and  positioned.   The left upper extremity was then prepped and  draped in a standard sterile fashion. The appropriate time-out  procedure was carried out. The patient did receive IV antibiotics  within 30 minutes of incision.   Shoulder was noted to be quite stiff as noted above in findings.  A gentle manipulation was performed and satisfactory lysis of adhesions was noted with recovery of near full range of motion.  A small posterior portal incision was made and the arthroscope was introduced into the joint. An anterior portal was then established above the subscapularis using needle localization. Small cannula was placed anteriorly. Diagnostic arthroscopy was then carried out   He was noted to have significant synovitis in the joint.  The subscapularis was intact.  The superior labrum had mild fraying which was debrided.  The biceps tendon was intact.  The undersurface of the supraspinatus was noted to be sagging without any tension and very thinned but no evidence for definite full-thickness tearing.  An anterior capsular release was carried out to try and improve the patient's motion.  The rotator interval was debrided with the ArthroCare and shaver.  There was significant thickened tissue here.  Anterior capsule was then released down to expose the entire subscapularis.  The  arthroscope was then introduced into the subacromial space a standard lateral portal was established with needle localization. The shaver was used through the lateral portal to perform extensive bursectomy. Coracoacromial ligament was examined and found to be frayed indicating chronic impingement.  The bursal surface of the rotator cuff was  found to have a deep near complete tear which was just off of the tuberosity medially.  There was still tendinous fibers medially to the tear I felt that repair was still possible here even though there was significant tendon the left on the tuberosity laterally.  The pathologic tendon was debrided and the medial articular border of the tuberosity was debrided down to a bleeding surface to promote healing.  A 4.75 peek swivel lock anchor was placed centrally in the tuberosity and 4 suture tape strands placed in the anchor were then passed evenly anterior to posterior in the tear.  These 4 strands were then brought over lateral to the tuberosity and placed in an additional 4.75 peek swivel lock anchor to bring the tendon back to the prepared tuberosity without any tension.  The repair was felt to be complete.  The coracoacromial ligament was taken down off the anterior acromion with the ArthroCare exposing a moderate hooked anterior acromial spur. A high-speed bur was then used through the lateral portal to take down the anterior acromial spur from lateral to medial in a standard acromioplasty.  The acromioplasty was also viewed from the lateral portal and the bur was used as necessary to ensure that the acromion was completely flat from posterior to anterior.  The arthroscopic equipment was removed from the joint and the portals were closed with 3-0 nylon in an interrupted fashion. Sterile dressings were then applied including Xeroform 4 x 4's ABDs and tape. The patient was then allowed to awaken from general anesthesia, placed in a sling, transferred to the stretcher and taken to the recovery room in stable condition.   POSTOPERATIVE PLAN: The patient will be discharged home today and will followup in one week for suture removal and wound check.  We will get him into therapy early

## 2019-03-21 NOTE — Anesthesia Postprocedure Evaluation (Signed)
Anesthesia Post Note  Patient: MARVON SHILLINGBURG  Procedure(s) Performed: ARTHROSCOPIC ROTATOR CUFF REPAIR (Left Shoulder) SHOULDER ARTHROSCOPY WITH SUBACROMIAL DECOMPRESSION (Left Shoulder)     Patient location during evaluation: PACU Anesthesia Type: Regional and General Level of consciousness: awake and alert, oriented and patient cooperative Pain management: pain level controlled Vital Signs Assessment: post-procedure vital signs reviewed and stable Respiratory status: spontaneous breathing, nonlabored ventilation and respiratory function stable Cardiovascular status: blood pressure returned to baseline and stable Postop Assessment: no apparent nausea or vomiting Anesthetic complications: no    Last Vitals:  Vitals:   03/21/19 1130 03/21/19 1207  BP: (!) 154/84 (!) 155/90  Pulse: 91 82  Resp: 14   Temp:    SpO2: 93% 97%    Last Pain:  Vitals:   03/21/19 1115  TempSrc:   PainSc: 0-No pain                 Pervis Hocking

## 2019-03-21 NOTE — Anesthesia Procedure Notes (Signed)
Procedure Name: Intubation Date/Time: 03/21/2019 9:35 AM Performed by: Bufford Spikes, CRNA Pre-anesthesia Checklist: Patient identified, Emergency Drugs available, Suction available and Patient being monitored Patient Re-evaluated:Patient Re-evaluated prior to induction Oxygen Delivery Method: Circle system utilized Preoxygenation: Pre-oxygenation with 100% oxygen Induction Type: IV induction Ventilation: Mask ventilation without difficulty Laryngoscope Size: Miller and 2 Grade View: Grade I Tube type: Oral Tube size: 7.5 mm Number of attempts: 1 Airway Equipment and Method: Stylet Placement Confirmation: ETT inserted through vocal cords under direct vision,  positive ETCO2 and breath sounds checked- equal and bilateral Secured at: 21 cm Tube secured with: Tape Dental Injury: Teeth and Oropharynx as per pre-operative assessment

## 2019-03-21 NOTE — Anesthesia Procedure Notes (Signed)
Anesthesia Regional Block: Interscalene brachial plexus block   Pre-Anesthetic Checklist: ,, timeout performed, Correct Patient, Correct Site, Correct Laterality, Correct Procedure, Correct Position, site marked, Risks and benefits discussed,  Surgical consent,  Pre-op evaluation,  At surgeon's request and post-op pain management  Laterality: Left  Prep: Maximum Sterile Barrier Precautions used, chloraprep       Needles:  Injection technique: Single-shot  Needle Type: Echogenic Stimulator Needle     Needle Length: 9cm  Needle Gauge: 22     Additional Needles:   Procedures:,,,, ultrasound used (permanent image in chart),,,,  Narrative:  Start time: 03/21/2019 8:30 AM End time: 03/21/2019 8:45 AM Injection made incrementally with aspirations every 5 mL.  Performed by: Personally  Anesthesiologist: Pervis Hocking, DO  Additional Notes: Monitors applied. No increased pain on injection. No increased resistance to injection. Injection made in 5cc increments. Good needle visualization. Patient tolerated procedure well.

## 2019-03-21 NOTE — Anesthesia Preprocedure Evaluation (Addendum)
Anesthesia Evaluation  Patient identified by MRN, date of birth, ID band Patient awake    Reviewed: Allergy & Precautions, H&P , NPO status , Patient's Chart, lab work & pertinent test results  History of Anesthesia Complications Negative for: history of anesthetic complications  Airway Mallampati: II  TM Distance: >3 FB Neck ROM: full    Dental  (+) Missing, Edentulous Upper, Dental Advisory Given, Poor Dentition,    Pulmonary pneumonia, resolved, former smoker,  Hx covid PNA July 2020, 9 day hospitalization  Former smoker, quit 09/2018   Pulmonary exam normal breath sounds clear to auscultation       Cardiovascular hypertension, + DVT  Normal cardiovascular exam Rhythm:regular Rate:Normal     Neuro/Psych PSYCHIATRIC DISORDERS Depression negative neurological ROS     GI/Hepatic negative GI ROS, Neg liver ROS,   Endo/Other  diabetes, Type 2, Insulin DependentObesity BMI 31  Renal/GU Hx nephrolithiasis      Musculoskeletal  (+) Arthritis , Osteoarthritis,  Hx anterior cervical spine fusion 2007   Abdominal Normal abdominal exam  (+)   Peds  Hematology negative hematology ROS (+)   Anesthesia Other Findings   Reproductive/Obstetrics negative OB ROS                            Anesthesia Physical  Anesthesia Plan  ASA: III  Anesthesia Plan: General and Regional   Post-op Pain Management: GA combined w/ Regional for post-op pain   Induction: Intravenous  PONV Risk Score and Plan: 2 and Ondansetron, Dexamethasone and Treatment may vary due to age or medical condition  Airway Management Planned: Oral ETT  Additional Equipment: None  Intra-op Plan:   Post-operative Plan: Extubation in OR  Informed Consent: I have reviewed the patients History and Physical, chart, labs and discussed the procedure including the risks, benefits and alternatives for the proposed anesthesia with the  patient or authorized representative who has indicated his/her understanding and acceptance.     Dental Advisory Given  Plan Discussed with: CRNA  Anesthesia Plan Comments:         Anesthesia Quick Evaluation

## 2019-03-21 NOTE — Progress Notes (Signed)
Assisted Dr. Finucane with left, ultrasound guided, interscalene  block. Side rails up, monitors on throughout procedure. See vital signs in flow sheet. Tolerated Procedure well. 

## 2019-03-21 NOTE — Discharge Instructions (Signed)
Discharge Instructions after Arthroscopic Shoulder Repair   A sling has been provided for you. Remain in your sling at all times. This includes sleeping in your sling.  Use ice on the shoulder intermittently over the first 48 hours after surgery.  Pain medicine has been prescribed for you.  Use your medicine liberally over the first 48 hours, and then you can begin to taper your use. You may take Extra Strength Tylenol or Tylenol only in place of the pain pills. DO NOT take ANY nonsteroidal anti-inflammatory pain medications: Advil, Motrin, Ibuprofen, Aleve, Naproxen, or Narprosyn.  You may remove your dressing after two days. If the incision sites are still moist, place a Band-Aid over the moist site(s). Change Band-Aids daily until dry.  You may shower 5 days after surgery. The incisions CANNOT get wet prior to 5 days. Simply allow the water to wash over the site and then pat dry. Do not rub the incisions. Make sure your axilla (armpit) is completely dry after showering.  Take one aspirin a day for 2 weeks after surgery, unless you have an aspirin sensitivity/ allergy or asthma.   Please call [phone] during normal business hours or [phone] after hours for any problems. Including the following:  - excessive redness of the incisions - drainage for more than 4 days - fever of more than 101.5 F  *Please note that pain medications will not be refilled after hours or on weekends. Regional Anesthesia Blocks  1. Numbness or the inability to move the "blocked" extremity may last from 3-48 hours after placement. The length of time depends on the medication injected and your individual response to the medication. If the numbness is not going away after 48 hours, call your surgeon.  2. The extremity that is blocked will need to be protected until the numbness is gone and the  Strength has returned. Because you cannot feel it, you will need to take extra care to avoid injury. Because it may be  weak, you may have difficulty moving it or using it. You may not know what position it is in without looking at it while the block is in effect.  3. For blocks in the legs and feet, returning to weight bearing and walking needs to be done carefully. You will need to wait until the numbness is entirely gone and the strength has returned. You should be able to move your leg and foot normally before you try and bear weight or walk. You will need someone to be with you when you first try to ensure you do not fall and possibly risk injury.  4. Bruising and tenderness at the needle site are common side effects and will resolve in a few days.  5. Persistent numbness or new problems with movement should be communicated to the surgeon or the [facility] [phone] [place] [phone]).Information for Discharge Teaching: EXPAREL (bupivacaine liposome injectable suspension)   Your surgeon or anesthesiologist gave you EXPAREL(bupivacaine) to help control your pain after surgery.   EXPAREL is a local anesthetic that provides pain relief by numbing the tissue around the surgical site.  EXPAREL is designed to release pain medication over time and can control pain for up to 72 hours.  Depending on how you respond to EXPAREL, you may require less pain medication during your recovery.  Possible side effects:  Temporary loss of sensation or ability to move in the area where bupivacaine was injected.  Nausea, vomiting, constipation  Rarely, numbness and  in your mouth or lips, lightheadedness, or anxiety may occur. °Call your doctor right away if you think you may be experiencing any of these sensations, or if you have other questions regarding possible side effects. ° °Follow all other discharge instructions given to you by your surgeon or nurse. Eat a healthy diet and drink plenty of water or other fluids. ° °If you return to the hospital for any reason within 96 hours  following the administration of EXPAREL, it is important for health care providers to know that you have received this anesthetic. A teal colored band has been placed on your arm with the date, time and amount of EXPAREL you have received in order to alert and inform your health care providers. Please leave this armband in place for the full 96 hours following administration, and then you may remove the band.  ° ° °Post Anesthesia Home Care Instructions ° °Activity: °Get plenty of rest for the remainder of the day. A responsible individual must stay with you for 24 hours following the procedure.  °For the next 24 hours, DO NOT: °-Drive a car °-Operate machinery °-Drink alcoholic beverages °-Take any medication unless instructed by your physician °-Make any legal decisions or sign important papers. ° °Meals: °Start with liquid foods such as gelatin or soup. Progress to regular foods as tolerated. Avoid greasy, spicy, heavy foods. If nausea and/or vomiting occur, drink only clear liquids until the nausea and/or vomiting subsides. Call your physician if vomiting continues. ° °Special Instructions/Symptoms: °Your throat may feel dry or sore from the anesthesia or the breathing tube placed in your throat during surgery. If this causes discomfort, gargle with warm salt water. The discomfort should disappear within 24 hours. ° °If you had a scopolamine patch placed behind your ear for the management of post- operative nausea and/or vomiting: ° °1. The medication in the patch is effective for 72 hours, after which it should be removed.  Wrap patch in a tissue and discard in the trash. Wash hands thoroughly with soap and water. °2. You may remove the patch earlier than 72 hours if you experience unpleasant side effects which may include dry mouth, dizziness or visual disturbances. °3. Avoid touching the patch. Wash your hands with soap and water after contact with the patch. °    ° °  °

## 2019-03-21 NOTE — Transfer of Care (Signed)
Immediate Anesthesia Transfer of Care Note  Patient: Robert Hines  Procedure(s) Performed: ARTHROSCOPIC ROTATOR CUFF REPAIR (Left Shoulder) SHOULDER ARTHROSCOPY WITH SUBACROMIAL DECOMPRESSION (Left Shoulder)  Patient Location: PACU  Anesthesia Type:General  Level of Consciousness: awake, alert  and oriented  Airway & Oxygen Therapy: Patient Spontanous Breathing and Patient connected to nasal cannula oxygen  Post-op Assessment: Report given to RN and Post -op Vital signs reviewed and stable  Post vital signs: Reviewed and stable  Last Vitals:  Vitals Value Taken Time  BP 161/87 03/21/19 1052  Temp 36.8 C 03/21/19 1052  Pulse 90 03/21/19 1057  Resp 13 03/21/19 1057  SpO2 98 % 03/21/19 1057  Vitals shown include unvalidated device data.  Last Pain:  Vitals:   03/21/19 1052  TempSrc:   PainSc: Asleep         Complications: No apparent anesthesia complications

## 2019-03-21 NOTE — H&P (Signed)
Robert Hines is an 63 y.o. male.   Chief Complaint: Left shoulder pain and dysfunction HPI: Robert Hines is a 63 year old gentleman with a long history of left shoulder pain which is failed conservative management over a year.  MRI shows near full-thickness rotator cuff tear.  Indicated for surgical treatment to try and decrease pain and restore function.  Past Medical History:  Diagnosis Date  . Arthritis    neck  . Depression   . Diabetes mellitus without complication ([place])    type 2  . Hyperlipidemia   . Hypertension   . Pneumonia    covid pneumonia and covid july 2020, d/ced july 16th from [place] stayed 9 days  . Renal disorder    calculi, multiple lithotripsies, stents  . Right cataract   . Rotator cuff disorder   . Spinal pain     Past Surgical History:  Procedure Laterality Date  . ANTERIOR FUSION CERVICAL SPINE  2007  . EYE SURGERY Left    cataract  . FOOT SURGERY Right    pin placed and removed  . HERNIA REPAIR     inguinal  . LITHOTRIPSY     several  . SHOULDER ARTHROSCOPY WITH ROTATOR CUFF REPAIR AND SUBACROMIAL DECOMPRESSION Right 12/28/2015   Procedure: SHOULDER ARTHROSCOPY TAKE DOWN ROTATOR CUFF REPAIR AND SUBACROMIAL DECOMPRESSION, DEBRIDEMENT OF SLAP TEAR;  Surgeon: Tania Ade, MD;  Location: [facility];  Service: Orthopedics;  Laterality: Right;  SHOULDER ARTHROSCOPY TAKE DOWN ROTATOR CUFF REPAIR AND SUBACROMIAL DECOMPRESSION  . VASECTOMY Bilateral     Family History  Problem Relation Age of Onset  . Hypertension Mother   . Heart failure Father   . Diabetes Father   . Hypertension Father   . Heart failure Sister   . Hypertension Sister    Social History:  reports that he quit smoking about 5 months ago. His smoking use included cigarettes. He has never used smokeless tobacco. He reports current alcohol use. He reports that he does not use drugs.  Allergies:  Allergies  Allergen Reactions  . Codeine Diarrhea and Nausea Only   . Duloxetine Diarrhea and Nausea Only    cymbalta is rash    Medications Prior to Admission  Medication Sig Dispense Refill  . acetaminophen (TYLENOL) 500 MG tablet Take 1,000 mg by mouth every 6 (six) hours as needed.    [name] buPROPion (WELLBUTRIN XL) 300 MG 24 hr tablet TAKE 1 TABLET BY MOUTH DAILY 90 tablet 3  . diclofenac sodium (VOLTAREN) 1 % GEL Apply 2 g topically 4 (four) times daily. To affected shoulder joint. 100 g 0  . gabapentin (NEURONTIN) 300 MG capsule TAKE 3 CAPSULES BY MOUTH IN THE MORNING AND 4 CAPSULES IN THE EVENING 630 capsule 3  . Insulin Glargine (BASAGLAR KWIKPEN) 100 UNIT/ML SOPN Inject 0.92 mLs (92 Units total) into the skin at bedtime. (Patient taking differently: Inject 94 Units into the skin at bedtime. ) 9 pen 11  . lisinopril (ZESTRIL) 20 MG tablet Take 1 tablet (20 mg total) by mouth daily. 90 tablet 3  . simvastatin (ZOCOR) 40 MG tablet Take 1 tablet (40 mg total) by mouth daily. 90 tablet 3  . traMADol (ULTRAM) 50 MG tablet Take 2 tablets (100 mg total) by mouth 3 (three) times daily as needed. (Patient taking differently: Take 100 mg by mouth 3 (three) times daily. ) 626 tablet 0  . TRULICITY 1.5 RS/8.5IO SOPN INJECT 1.5 MLS INTO THE SKIN ONCE A WEEK. (Patient taking differently:  Wednesday in pm) 6 pen 3  . XIGDUO XR 03-999 MG TB24 TAKE 1 TABLET BY MOUTH DAILY 90 tablet 3  . zolpidem (AMBIEN) 10 MG tablet TAKE 1 TABLET AT BEDTIME AS NEEDED SLEEP 90 tablet 0  . aspirin 81 MG chewable tablet Chew by mouth daily.    . cyclobenzaprine (FLEXERIL) 10 MG tablet Take 10 mg by mouth 3 (three) times daily as needed for muscle spasms.    . Insulin Pen Needle 32G X 4 MM MISC Use as needed with toujeo pen 300 each 4    Results for orders placed or performed during the hospital encounter of 03/21/19 (from the past 48 hour(s))  I-STAT, chem 8     Status: Abnormal   Collection Time: 03/21/19  8:11 AM  Result Value Ref Range   Sodium 140 135 - 145 mmol/L   Potassium 3.9  3.5 - 5.1 mmol/L   Chloride 101 98 - 111 mmol/L   BUN 18 8 - 23 mg/dL   Creatinine, Ser 1.02 0.61 - 1.24 mg/dL   Glucose, Bld 725 (H) 70 - 99 mg/dL   Calcium, Ion 3.66 4.40 - 1.40 mmol/L   TCO2 24 22 - 32 mmol/L   Hemoglobin 15.6 13.0 - 17.0 g/dL   HCT 34.7 42.5 - 95.6 %   No results found.  Review of Systems  All other systems reviewed and are negative.   Blood pressure (!) 156/83, pulse 84, temperature 97.6 F (36.4 C), temperature source Oral, resp. rate 11, height 6\' 1"  (1.854 m), weight 107.6 kg, SpO2 100 %. Physical Exam  Constitutional: He is oriented to person, place, and time. He appears well-developed and well-nourished.  HENT:  Head: Atraumatic.  Eyes: EOM are normal.  Cardiovascular: Intact distal pulses.  Respiratory: Effort normal.  Musculoskeletal:     Comments: Left shoulder pain and giveaway weakness with rotator cuff testing.  Distally neurovascularly intact.  Neurological: He is alert and oriented to person, place, and time.  Skin: Skin is warm and dry.  Psychiatric: He has a normal mood and affect.     Assessment/Plan Robert Hines is a 63 year old gentleman with a long history of left shoulder pain which is failed conservative management over a year.  MRI shows near full-thickness rotator cuff tear.  Indicated for surgical treatment to try and decrease pain and restore function.  Plan left shoulder arthroscopic rotator cuff repair, subacromial decompression Risks / benefits of surgery discussed Consent on chart  NPO for OR Preop antibiotics   76, MD 03/21/2019, 8:49 AM

## 2019-03-22 ENCOUNTER — Encounter (HOSPITAL_BASED_OUTPATIENT_CLINIC_OR_DEPARTMENT_OTHER): Payer: Self-pay | Admitting: Orthopedic Surgery

## 2019-04-01 ENCOUNTER — Encounter: Payer: Self-pay | Admitting: Sports Medicine

## 2019-04-02 ENCOUNTER — Other Ambulatory Visit: Payer: Self-pay

## 2019-04-02 ENCOUNTER — Ambulatory Visit (INDEPENDENT_AMBULATORY_CARE_PROVIDER_SITE_OTHER): Payer: 59 | Admitting: Sports Medicine

## 2019-04-02 DIAGNOSIS — Z23 Encounter for immunization: Secondary | ICD-10-CM | POA: Diagnosis not present

## 2019-04-02 NOTE — Addendum Note (Signed)
Addended by: Alena Bills R on: 04/02/2019 05:00 PM   Modules accepted: Orders

## 2019-04-04 ENCOUNTER — Ambulatory Visit (INDEPENDENT_AMBULATORY_CARE_PROVIDER_SITE_OTHER): Payer: 59 | Admitting: Physical Therapy

## 2019-04-04 ENCOUNTER — Other Ambulatory Visit: Payer: Self-pay

## 2019-04-04 DIAGNOSIS — R29898 Other symptoms and signs involving the musculoskeletal system: Secondary | ICD-10-CM | POA: Diagnosis not present

## 2019-04-04 DIAGNOSIS — M25512 Pain in left shoulder: Secondary | ICD-10-CM | POA: Diagnosis not present

## 2019-04-04 DIAGNOSIS — M25612 Stiffness of left shoulder, not elsewhere classified: Secondary | ICD-10-CM | POA: Diagnosis not present

## 2019-04-04 DIAGNOSIS — R293 Abnormal posture: Secondary | ICD-10-CM | POA: Diagnosis not present

## 2019-04-04 DIAGNOSIS — M6281 Muscle weakness (generalized): Secondary | ICD-10-CM

## 2019-04-04 NOTE — Patient Instructions (Signed)
Access Code: [email or web address]  URL: [email or web address]  Date: 04/04/2019  Prepared by: Faustino Congress   Exercises  Circular Shoulder Pendulum with Table Support - 10 reps - 1 sets - 3x daily - 7x weekly  Flexion-Extension Shoulder Pendulum with Table Support - 10 reps - 1 sets - 3x daily - 7x weekly  Horizontal Shoulder Pendulum with Table Support - 10 reps - 1 sets - 3x daily - 7x weekly  Standing Elbow Flexion Extension AROM - 10 reps - 1 sets - 3x daily - 7x weekly  Wrist Flexion Extension AROM - Palms Down - 10 reps - 1 sets - 3x daily - 7x weekly  Seated Gripping Towel - 10 reps - 1 sets - 3x daily - 7x weekly

## 2019-04-04 NOTE — Therapy (Signed)
[facility] [address] [place], [place], [address] Phone: [phone]   Fax:  [phone]  Physical Therapy Evaluation  Patient Details  Name: Robert Hines MRN: [id number] Date of Birth: [birth year] Referring Provider (PT): Jones Broom, MD   Encounter Date: 04/04/2019  PT End of Session - 04/04/19 1147    Visit Number  1    Number of Visits  30    Date for PT Re-Evaluation  06/27/19    PT Start Time  1101    PT Stop Time  1150    PT Time Calculation (min)  49 min    Activity Tolerance  Patient tolerated treatment well    Behavior During Therapy  [facility] for tasks assessed/performed       Past Medical History:  Diagnosis Date  . Arthritis    neck  . Depression   . Diabetes mellitus without complication ([facility])    type 2  . Hyperlipidemia   . Hypertension   . Pneumonia    covid pneumonia and covid july 2020, d/ced july 16th from [facility] stayed 9 days  . Renal disorder    calculi, multiple lithotripsies, stents  . Right cataract   . Rotator cuff disorder   . Spinal pain     Past Surgical History:  Procedure Laterality Date  . ANTERIOR FUSION CERVICAL SPINE  2007  . ARTHOSCOPIC ROTAOR CUFF REPAIR Left 03/21/2019   Procedure: ARTHROSCOPIC ROTATOR CUFF REPAIR;  Surgeon: Jones Broom, MD;  Location: [facility];  Service: Orthopedics;  Laterality: Left;  . EYE SURGERY Left    cataract  . FOOT SURGERY Right    pin placed and removed  . HERNIA REPAIR     inguinal  . LITHOTRIPSY     several  . SHOULDER ARTHROSCOPY WITH ROTATOR CUFF REPAIR AND SUBACROMIAL DECOMPRESSION Right 12/28/2015   Procedure: SHOULDER ARTHROSCOPY TAKE DOWN ROTATOR CUFF REPAIR AND SUBACROMIAL DECOMPRESSION, DEBRIDEMENT OF SLAP TEAR;  Surgeon: Jones Broom, MD;  Location: [facility];  Service: Orthopedics;  Laterality: Right;  SHOULDER ARTHROSCOPY TAKE DOWN ROTATOR CUFF REPAIR AND SUBACROMIAL  DECOMPRESSION  . SHOULDER ARTHROSCOPY WITH SUBACROMIAL DECOMPRESSION Left 03/21/2019   Procedure: SHOULDER ARTHROSCOPY WITH SUBACROMIAL DECOMPRESSION;  Surgeon: Jones Broom, MD;  Location[facility];  Service: Orthopedics;  Laterality: Left;  [name] VASECTOMY Bilateral     There were no vitals filed for this visit.   Subjective Assessment - 04/04/19 1104    Subjective  Pt is a 63 y/o male who presents to OPPT s/p Lt RTC repair on 03/21/2019.    Pertinent History  Rt RTC repair 2017    Limitations  Lifting;Writing;House hold activities    Patient Stated Goals  regain function/use of Lt shoulder    Currently in Pain?  Yes    Pain Score  0-No pain   up to 6/10   Pain Location  Shoulder    Pain Orientation  Left    Pain Descriptors / Indicators  Sharp;Stabbing;Throbbing;Aching    Pain Type  Surgical pain;Acute pain    Pain Onset  1 to 4 weeks ago    Pain Frequency  Intermittent    Aggravating Factors   movement    Pain Relieving Factors  pain medication, ice         [facility] PT Assessment - 04/04/19 1108      Assessment   Medical Diagnosis  RTC repair    Referring Provider (PT)  Jones Broom, MD  Onset Date/Surgical Date  03/21/19    Hand Dominance  Right    Next MD Visit  pt unsure    Prior Therapy  following Rt RTC repair      Precautions   Precautions  Shoulder    Shoulder Interventions  Shoulder sling/immobilizer;Off for dressing/bathing/exercises    Required Braces or Orthoses  Sling      Restrictions   Weight Bearing Restrictions  No      Balance Screen   Has the patient fallen in the past 6 months  No    Has the patient had a decrease in activity level because of a fear of falling?   No    Is the patient reluctant to leave their home because of a fear of falling?   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, I workOptometrist, no regular exercise      Cognition   Overall Cognitive Status  Within Functional Limits for tasks assessed      Observation/Other Assessments   Focus on Therapeutic Outcomes (FOTO)   19 (81% limited; predicted 44% limited)      Posture/Postural Control   Posture/Postural Control  Postural limitations    Postural Limitations  Rounded Shoulders;Forward head      ROM / Strength   AROM / PROM / Strength  PROM;AROM      AROM   Overall AROM Comments  measured supine    AROM Assessment Site  Shoulder    Right/Left Shoulder  Right    Right Shoulder Flexion  145 Degrees    Right Shoulder ABduction  118 Degrees    Right Shoulder Internal Rotation  --   FIT to T10/11   Right Shoulder External Rotation  45 Degrees      PROM   PROM Assessment Site  Shoulder    Right/Left Shoulder  Left    Left Shoulder Flexion  34 Degrees    Left Shoulder ABduction  58 Degrees    Left Shoulder Internal Rotation  --   to abdomen with shoulder in 15 deg abd   Left Shoulder External Rotation  4 Degrees                Objective measurements completed on examination: See above findings.      [facility] Adult PT Treatment/Exercise - 04/04/19 1108      Exercises   Exercises  Shoulder      Shoulder Exercises: ROM/Strengthening   Pendulum  performed ant/post, lateral and circles x 5-10 reps each for HEP instruction    Other ROM/Strengthening Exercises  5-10 reps of elbow flexion, wrist flexion/extension, and grip squeeze      Modalities   Modalities  Vasopneumatic      Vasopneumatic   Number Minutes Vasopneumatic   10 minutes    Vasopnuematic Location    Shoulder    Vasopneumatic Pressure  Low    Vasopneumatic Temperature   34 deg  Manual Therapy   Manual Therapy  Passive ROM    Passive ROM  Lt shoulder flexion, abduction and external rotation - increased spasms and pain; motion limited             PT Education - 04/04/19 1147    Education Details  HEP    Person(s) Educated  Patient    Methods  Explanation;Demonstration;Handout    Comprehension  Verbalized understanding;Returned demonstration;Need further instruction       PT Short Term Goals - 04/04/19 1218      PT SHORT TERM GOAL #1   Title  independent with initial HEP    Status  New    Target Date  05/02/19      PT SHORT TERM GOAL #2   Title  improve Lt shoulder PROM to limits of protocol (flexion 90 deg; er 20 deg) in preparation to move into next phase of protocol    Status  New    Target Date  05/02/19      PT SHORT TERM GOAL #3   Title  report pain < 4/10 with PROM for improved pain and tolerance    Status  New    Target Date  05/02/19        PT Long Term Goals - 04/04/19 1219      PT LONG TERM GOAL #1   Title  independent with advanced HEP    Status  New    Target Date  06/27/19      PT LONG TERM GOAL #2   Title  FOTO score improved to </= 44% limited for improved function    Status  New    Target Date  06/27/19      PT LONG TERM GOAL #3   Title  demonstrate Lt shoulder strength at least 3/5 for improved function    Status  New    Target Date  05/16/19      PT LONG TERM GOAL #4   Title  Lt shoulder AROM improved to within 5 deg of Rt shoulder for flexion, abduction and external rotation for improved function    Status  New    Target Date  06/27/19      PT LONG TERM GOAL #5   Title  report pain < 2/10 for improved function and mobility    Status  New    Target Date  06/27/19             Plan - 04/04/19 1148    Clinical Impression Statement  Pt is a 63 y/o male who presents to OPPT s/p Lt RTC repair on 03/21/2019.  Pt  demonstrates postural abnomalities, decreased ROM and strength affecting function.  Pt limited today by increased muscle spasms with PROM, and will consider Rx muscle relaxer prior to appts.  Will benefit from PT to address deficits listed.    Personal Factors and Comorbidities  Past/Current Experience;Comorbidity 3+    Comorbidities  bil RTC repairs; HTN, COVID+ 12/2018, depression, DM, OA    Examination-Activity Limitations  Bathing;Reach Overhead;Bed Mobility;Sleep;Toileting;Hygiene/Grooming;Dressing;Carry    Examination-Participation Restrictions  Yard Work;Other;Driving   occupation   Stability/Clinical Decision Making  Stable/Uncomplicated    Clinical Decision Making  Low    Rehab Potential  Good    PT Frequency  3x / week    PT Duration  12 weeks   3x/wk x 4 wks; then 2x/wk x 8 wks   PT Treatment/Interventions  ADLs/Self Care Home Management;Cryotherapy;Electrical Stimulation;Ultrasound;Moist Heat;Iontophoresis 4mg /ml Dexamethasone;Therapeutic activities;Therapeutic exercise;Patient/family education;Manual  techniques;Passive range of motion;Scar mobilization;Vasopneumatic Device;Taping    PT Next Visit Plan  review HEP, continue with PROM per protocol    PT Home Exercise Plan  Access Code: [email or web address]       Patient will benefit from skilled therapeutic intervention in order to improve the following deficits and impairments:  Decreased range of motion, Increased fascial restricitons, Increased muscle spasms, Pain, Postural dysfunction, Decreased strength  Visit Diagnosis: Acute pain of left shoulder - Plan: PT plan of care cert/re-cert  Stiffness of left shoulder, not elsewhere classified - Plan: PT plan of care cert/re-cert  Abnormal posture - Plan: PT plan of care cert/re-cert  Other symptoms and signs involving the musculoskeletal system - Plan: PT plan of care cert/re-cert  Muscle weakness (generalized) - Plan: PT plan of care cert/re-cert     Problem List Patient Active  Problem List   Diagnosis Date Noted  . Thrombocytopenia ([place]) 02/03/2019  . Pneumonia due to COVID-19 virus 12/20/2018  . Mass of left thigh 05/24/2018  . History of DVT (deep vein thrombosis) 05/09/2018  . Pain and swelling of left lower extremity 05/09/2018  . Cold sore 04/26/2018  . Primary insomnia 10/30/2017  . S/P cervical spinal fusion 09/28/2017  . Superficial venous thrombosis of right arm 01/05/2017  . Left shoulder pain 01/05/2017  . Depression 05/02/2016  . Ingrown right big toenail 02/16/2016  . Obesity 01/23/2015  . Right shoulder pain 01/23/2015  . Former smoker 08/28/2014  . Nephrolithiasis 07/25/2014  . Diabetes mellitus, type 2 ([place]) 07/25/2014  . Essential hypertension, benign 07/25/2014  . Annual physical exam 07/25/2014  . Hyperlipidemia 07/25/2014       Laureen Abrahams, PT, DPT 04/04/19 12:25 PM     [facility] [address] [place], [place], [address] Phone: [phone]   Fax:  [phone]  Name: JERRID FORGETTE MRN: [id number] Date of Birth: [birth year]

## 2019-04-08 ENCOUNTER — Other Ambulatory Visit: Payer: Self-pay

## 2019-04-08 ENCOUNTER — Encounter: Payer: Self-pay | Admitting: Physical Therapy

## 2019-04-08 ENCOUNTER — Ambulatory Visit (INDEPENDENT_AMBULATORY_CARE_PROVIDER_SITE_OTHER): Payer: 59 | Admitting: Physical Therapy

## 2019-04-08 DIAGNOSIS — M25512 Pain in left shoulder: Secondary | ICD-10-CM

## 2019-04-08 DIAGNOSIS — R29898 Other symptoms and signs involving the musculoskeletal system: Secondary | ICD-10-CM

## 2019-04-08 DIAGNOSIS — M25612 Stiffness of left shoulder, not elsewhere classified: Secondary | ICD-10-CM | POA: Diagnosis not present

## 2019-04-08 DIAGNOSIS — R293 Abnormal posture: Secondary | ICD-10-CM

## 2019-04-08 DIAGNOSIS — M6281 Muscle weakness (generalized): Secondary | ICD-10-CM

## 2019-04-08 NOTE — Therapy (Signed)
[place], [place], [address] Phone: [phone]   Fax:  [phone]  Physical Therapy Treatment  Patient Details  Name: KALEM ROCKWELL MRN: [id number] Date of Birth: 1955-08-30 Referring Provider (PT): Tania Ade, MD   Encounter Date: 04/08/2019  PT End of Session - 04/08/19 1100    Visit Number  2    Number of Visits  30    Date for PT Re-Evaluation  06/27/19    PT Start Time  1017    PT Stop Time  1102    PT Time Calculation (min)  45 min    Activity Tolerance  Patient tolerated treatment well    Behavior During Therapy  [facility] for tasks assessed/performed       Past Medical History:  Diagnosis Date  . Arthritis    neck  . Depression   . Diabetes mellitus without complication ([place])    type 2  . Hyperlipidemia   . Hypertension   . Pneumonia    covid pneumonia and covid july 2020, d/ced july 16th from [place] stayed 9 days  . Renal disorder    calculi, multiple lithotripsies, stents  . Right cataract   . Rotator cuff disorder   . Spinal pain     Past Surgical History:  Procedure Laterality Date  . ANTERIOR FUSION CERVICAL SPINE  2007  . ARTHOSCOPIC ROTAOR CUFF REPAIR Left 03/21/2019   Procedure: ARTHROSCOPIC ROTATOR CUFF REPAIR;  Surgeon: Tania Ade, MD;  Location: [facility];  Service: Orthopedics;  Laterality: Left;  . EYE SURGERY Left    cataract  . FOOT SURGERY Right    pin placed and removed  . HERNIA REPAIR     inguinal  . LITHOTRIPSY     several  . SHOULDER ARTHROSCOPY WITH ROTATOR CUFF REPAIR AND SUBACROMIAL DECOMPRESSION Right 12/28/2015   Procedure: SHOULDER ARTHROSCOPY TAKE DOWN ROTATOR CUFF REPAIR AND SUBACROMIAL DECOMPRESSION, DEBRIDEMENT OF SLAP TEAR;  Surgeon: Tania Ade, MD;  Location: [facility];  Service: Orthopedics;  Laterality: Right;  SHOULDER ARTHROSCOPY TAKE DOWN ROTATOR CUFF REPAIR AND SUBACROMIAL  DECOMPRESSION  . SHOULDER ARTHROSCOPY WITH SUBACROMIAL DECOMPRESSION Left 03/21/2019   Procedure: SHOULDER ARTHROSCOPY WITH SUBACROMIAL DECOMPRESSION;  Surgeon: Tania Ade, MD;  Location: [facility];  Service: Orthopedics;  Laterality: Left;  [name] VASECTOMY Bilateral     There were no vitals filed for this visit.  Subjective Assessment - 04/08/19 1021    Subjective  Pt stated that he took a muscle relaxant this morning and it is making him feel drowsy and relaxed.    Limitations  Lifting;Writing;House hold activities    Currently in Pain?  No/denies    Pain Score  0-No pain         [facility] PT Assessment - 04/08/19 0001      Assessment   Medical Diagnosis  RTC repair    Referring Provider (PT)  Tania Ade, MD    Onset Date/Surgical Date  03/21/19    Hand Dominance  Right    Next MD Visit  pt unsure    Prior Therapy  following Rt RTC repair       [facility] Adult PT Treatment/Exercise - 04/08/19 0001      Elbow Exercises   Elbow Flexion  Left;20 reps;AROM   no wt in hand     Shoulder Exercises: Seated   Other Seated Exercises  scap squeeze x 5 sec x 10; shoulder rolls x 5 reps (  increased difficulty)    Other Seated Exercises  chin tucks x 3 sec x 10 reps       Shoulder Exercises: Standing   Other Standing Exercises  pendulum x 20 side / side      Hand Exercises   Other Hand Exercises  medium handmaster - ball squeeze and finger ext.   X 20     Wrist Exercises   Wrist Extension  Strengthening;Left;15 reps    Theraband Level (Wrist Extension)  Level 1 (Yellow)    Wrist Radial Deviation  Strengthening;Left;10 reps    Bar Weights/Barbell (Radial Deviation)  1 lb      Modalities   Modalities  Vasopneumatic      Vasopneumatic   Number Minutes Vasopneumatic   10 minutes    Vasopnuematic Location   Shoulder    Vasopneumatic Pressure  Low    Vasopneumatic Temperature   34 deg      Manual Therapy   Manual Therapy  Passive ROM    Passive ROM  Lt  shoulder flexion, ext, scaption and external rotation - increased spasms and pain; motion limited               PT Short Term Goals - 04/04/19 1218      PT SHORT TERM GOAL #1   Title  independent with initial HEP    Status  New    Target Date  05/02/19      PT SHORT TERM GOAL #2   Title  improve Lt shoulder PROM to limits of protocol (flexion 90 deg; er 20 deg) in preparation to move into next phase of protocol    Status  New    Target Date  05/02/19      PT SHORT TERM GOAL #3   Title  report pain < 4/10 with PROM for improved pain and tolerance    Status  New    Target Date  05/02/19        PT Long Term Goals - 04/04/19 1219      PT LONG TERM GOAL #1   Title  independent with advanced HEP    Status  New    Target Date  06/27/19      PT LONG TERM GOAL #2   Title  FOTO score improved to </= 44% limited for improved function    Status  New    Target Date  06/27/19      PT LONG TERM GOAL #3   Title  demonstrate Lt shoulder strength at least 3/5 for improved function    Status  New    Target Date  05/16/19      PT LONG TERM GOAL #4   Title  Lt shoulder AROM improved to within 5 deg of Rt shoulder for flexion, abduction and external rotation for improved function    Status  New    Target Date  06/27/19      PT LONG TERM GOAL #5   Title  report pain < 2/10 for improved function and mobility    Status  New    Target Date  06/27/19            Plan - 04/08/19 1053    Clinical Impression Statement  HEP reviewed with pt; required intermittent VCs for posture and technique with exercise demo. Pt had some mild spasming in Lt shoulder during PROM. Lt shoulder PROM (within protocol range) to tissue tolerance and no pain.  Pt will benefit from further PT to  improve functional mobility.    Personal Factors and Comorbidities  Past/Current Experience;Comorbidity 3+    Comorbidities  bil RTC repairs; HTN, COVID+ 12/2018, depression, DM, OA    Examination-Activity  Limitations  Bathing;Reach Overhead;Bed Mobility;Sleep;Toileting;Hygiene/Grooming;Dressing;Carry    Examination-Participation Restrictions  Yard Work;Other;Driving   occupation   Stability/Clinical Decision Making  Stable/Uncomplicated    Rehab Potential  Good    PT Frequency  3x / week    PT Duration  12 weeks   3x/wk x 4 wks; then 2x/wk x 8 wks   PT Treatment/Interventions  ADLs/Self Care Home Management;Cryotherapy;Electrical Stimulation;Ultrasound;Moist Heat;Iontophoresis 4mg /ml Dexamethasone;Therapeutic activities;Therapeutic exercise;Patient/family education;Manual techniques;Passive range of motion;Scar mobilization;Vasopneumatic Device;Taping    PT Next Visit Plan  review HEP, continue progressing per shoulder protocol    PT Home Exercise Plan  Access Code:       Patient will benefit from skilled therapeutic intervention in order to improve the following deficits and impairments:  Decreased range of motion, Increased fascial restricitons, Increased muscle spasms, Pain, Postural dysfunction, Decreased strength  Visit Diagnosis: Acute pain of left shoulder  Stiffness of left shoulder, not elsewhere classified  Abnormal posture  Other symptoms and signs involving the musculoskeletal system  Muscle weakness (generalized)     Problem List Patient Active Problem List   Diagnosis Date Noted  . Thrombocytopenia (HCC) 02/03/2019  . Pneumonia due to COVID-19 virus 12/20/2018  . Mass of left thigh 05/24/2018  . History of DVT (deep vein thrombosis) 05/09/2018  . Pain and swelling of left lower extremity 05/09/2018  . Cold sore 04/26/2018  . Primary insomnia 10/30/2017  . S/P cervical spinal fusion 09/28/2017  . Superficial venous thrombosis of right arm 01/05/2017  . Left shoulder pain 01/05/2017  . Depression 05/02/2016  . Ingrown right big toenail 02/16/2016  . Obesity 01/23/2015  . Right shoulder pain 01/23/2015  . Former smoker 08/28/2014  . Nephrolithiasis  07/25/2014  . Diabetes mellitus, type 2 (HCC) 07/25/2014  . Essential hypertension, benign 07/25/2014  . Annual physical exam 07/25/2014  . Hyperlipidemia 07/25/2014   09/23/2014, [address], PTA 04/08/19 11:08 AM  [facility] [address], [place], [place] Phone: [phone]   Fax:  [phone]  Name: TYRUS WILMS MRN: [name] Date of Birth: [birth year]

## 2019-04-10 ENCOUNTER — Other Ambulatory Visit: Payer: Self-pay

## 2019-04-10 ENCOUNTER — Ambulatory Visit (INDEPENDENT_AMBULATORY_CARE_PROVIDER_SITE_OTHER): Payer: 59 | Admitting: Rehabilitative and Restorative Service Providers"

## 2019-04-10 DIAGNOSIS — M6281 Muscle weakness (generalized): Secondary | ICD-10-CM

## 2019-04-10 DIAGNOSIS — M25612 Stiffness of left shoulder, not elsewhere classified: Secondary | ICD-10-CM | POA: Diagnosis not present

## 2019-04-10 DIAGNOSIS — R29898 Other symptoms and signs involving the musculoskeletal system: Secondary | ICD-10-CM | POA: Diagnosis not present

## 2019-04-10 DIAGNOSIS — R293 Abnormal posture: Secondary | ICD-10-CM

## 2019-04-10 DIAGNOSIS — M25512 Pain in left shoulder: Secondary | ICD-10-CM | POA: Diagnosis not present

## 2019-04-10 NOTE — Therapy (Signed)
[place], [place], [address] Phone: [phone]   Fax:  [phone]  Physical Therapy Treatment  Patient Details  Name: Robert Hines MRN: [id number] Date of Birth: [birth year] Referring Provider (PT): Tania Ade, MD   Encounter Date: 04/10/2019  PT End of Session - 04/10/19 1020    Visit Number  3    Number of Visits  30    Date for PT Re-Evaluation  06/27/19    PT Start Time  1016    PT Stop Time  1107    PT Time Calculation (min)  51 min    Activity Tolerance  Patient tolerated treatment well    Behavior During Therapy  [facility] for tasks assessed/performed       Past Medical History:  Diagnosis Date  . Arthritis    neck  . Depression   . Diabetes mellitus without complication ([place])    type 2  . Hyperlipidemia   . Hypertension   . Pneumonia    covid pneumonia and covid july 2020, d/ced july 16th from [place] stayed 9 days  . Renal disorder    calculi, multiple lithotripsies, stents  . Right cataract   . Rotator cuff disorder   . Spinal pain     Past Surgical History:  Procedure Laterality Date  . ANTERIOR FUSION CERVICAL SPINE  2007  . ARTHOSCOPIC ROTAOR CUFF REPAIR Left 03/21/2019   Procedure: ARTHROSCOPIC ROTATOR CUFF REPAIR;  Surgeon: Tania Ade, MD;  Location: [facility];  Service: Orthopedics;  Laterality: Left;  . EYE SURGERY Left    cataract  . FOOT SURGERY Right    pin placed and removed  . HERNIA REPAIR     inguinal  . LITHOTRIPSY     several  . SHOULDER ARTHROSCOPY WITH ROTATOR CUFF REPAIR AND SUBACROMIAL DECOMPRESSION Right 12/28/2015   Procedure: SHOULDER ARTHROSCOPY TAKE DOWN ROTATOR CUFF REPAIR AND SUBACROMIAL DECOMPRESSION, DEBRIDEMENT OF SLAP TEAR;  Surgeon: Tania Ade, MD;  Location: [facility];  Service: Orthopedics;  Laterality: Right;  SHOULDER ARTHROSCOPY TAKE DOWN ROTATOR CUFF REPAIR AND SUBACROMIAL  DECOMPRESSION  . SHOULDER ARTHROSCOPY WITH SUBACROMIAL DECOMPRESSION Left 03/21/2019   Procedure: SHOULDER ARTHROSCOPY WITH SUBACROMIAL DECOMPRESSION;  Surgeon: Tania Ade, MD;  Location: [facility];  Service: Orthopedics;  Laterality: Left;  [name] VASECTOMY Bilateral     There were no vitals filed for this visit.  Subjective Assessment - 04/10/19 1018    Subjective  The patient took 1/2 of a pill for muscle relaxer this morning.   No pain at rest.  He notes he is having difficulty sleeping and changes position regularly durin gthe night.    Pertinent History  Rt RTC repair 2017    Patient Stated Goals  regain function/use of Lt shoulder    Currently in Pain?  No/denies         [facility] PT Assessment - 04/10/19 1050      PROM   PROM Assessment Site  Shoulder    Right/Left Shoulder  Left    Left Shoulder Flexion  46 Degrees    Left Shoulder ABduction  70 Degrees    Left Shoulder Internal Rotation  --   to abdomen with shoulder in 15 deg abd   Left Shoulder External Rotation  14 Degrees   arm abducted to 15 deg                  [facility] Adult PT Treatment/Exercise -  04/10/19 1023      Exercises   Exercises  Shoulder;Elbow      Elbow Exercises   Elbow Flexion  Left;20 reps;AROM    Other elbow exercises  While in supine with arm abducted to 20 degrees with PT supporting in PROM, added elbow flexion with wrist flexion/extension with arm supported for gentle movement oif surrounding musculature      Shoulder Exercises: Supine   Other Supine Exercises  Supine cervical retraction x 5 reps      Shoulder Exercises: Seated   Other Seated Exercises  scapular squeeze x 10 reps     Other Seated Exercises  gentle shoulder shrug x 5 reps      Shoulder Exercises: Standing   Other Standing Exercises  pendulum x 10 side / side   provokes pain level 3/10   Other Standing Exercises  pendulum ant/posterior x 10 reps, pendulum circular x 10 reps each direction within  toleable ROM      Modalities   Modalities  Vasopneumatic      Vasopneumatic   Number Minutes Vasopneumatic   10 minutes    Vasopnuematic Location   Shoulder    Vasopneumatic Pressure  Low    Vasopneumatic Temperature   34 deg      Manual Therapy   Manual Therapy  Passive ROM    Manual therapy comments  Patient supine    Passive ROM  L shoulder PROM into flexion, abduction, internal rotation, external rotation.  No muscle spasms today; patient tolerated greater ROM *see measurements.               PT Short Term Goals - 04/04/19 1218      PT SHORT TERM GOAL #1   Title  independent with initial HEP    Status  New    Target Date  05/02/19      PT SHORT TERM GOAL #2   Title  improve Lt shoulder PROM to limits of protocol (flexion 90 deg; er 20 deg) in preparation to move into next phase of protocol    Status  New    Target Date  05/02/19      PT SHORT TERM GOAL #3   Title  report pain < 4/10 with PROM for improved pain and tolerance    Status  New    Target Date  05/02/19        PT Long Term Goals - 04/04/19 1219      PT LONG TERM GOAL #1   Title  independent with advanced HEP    Status  New    Target Date  06/27/19      PT LONG TERM GOAL #2   Title  FOTO score improved to </= 44% limited for improved function    Status  New    Target Date  06/27/19      PT LONG TERM GOAL #3   Title  demonstrate Lt shoulder strength at least 3/5 for improved function    Status  New    Target Date  05/16/19      PT LONG TERM GOAL #4   Title  Lt shoulder AROM improved to within 5 deg of Rt shoulder for flexion, abduction and external rotation for improved function    Status  New    Target Date  06/27/19      PT LONG TERM GOAL #5   Title  report pain < 2/10 for improved function and mobility    Status  New  Target Date  06/27/19            Plan - 04/10/19 1313    Clinical Impression Statement  The patient is increasing PROM without muscle spasms today.   Continued PROM within protocol ROM.  With PROM abduction, patient c/o "edge of pain" at current PROM measurement today therefore reduced PROM with subsequent motion.  Patient will continue to benefit from PT to improve mobility.    PT Treatment/Interventions  ADLs/Self Care Home Management;Cryotherapy;Electrical Stimulation;Ultrasound;Moist Heat;Iontophoresis 4mg /ml Dexamethasone;Therapeutic activities;Therapeutic exercise;Patient/family education;Manual techniques;Passive range of motion;Scar mobilization;Vasopneumatic Device;Taping    PT Next Visit Plan  review HEP, continue progressing per shoulder protocol    PT Home Exercise Plan  Access Code: [email or web address]    Consulted and Agree with Plan of Care  Patient       Patient will benefit from skilled therapeutic intervention in order to improve the following deficits and impairments:  Decreased range of motion, Increased fascial restricitons, Increased muscle spasms, Pain, Postural dysfunction, Decreased strength  Visit Diagnosis: Acute pain of left shoulder  Stiffness of left shoulder, not elsewhere classified  Abnormal posture  Other symptoms and signs involving the musculoskeletal system  Muscle weakness (generalized)     Problem List Patient Active Problem List   Diagnosis Date Noted  . Thrombocytopenia (HCC) 02/03/2019  . Pneumonia due to COVID-19 virus 12/20/2018  . Mass of left thigh 05/24/2018  . History of DVT (deep vein thrombosis) 05/09/2018  . Pain and swelling of left lower extremity 05/09/2018  . Cold sore 04/26/2018  . Primary insomnia 10/30/2017  . S/P cervical spinal fusion 09/28/2017  . Superficial venous thrombosis of right arm 01/05/2017  . Left shoulder pain 01/05/2017  . Depression 05/02/2016  . Ingrown right big toenail 02/16/2016  . Obesity 01/23/2015  . Right shoulder pain 01/23/2015  . Former smoker 08/28/2014  . Nephrolithiasis 07/25/2014  . Diabetes mellitus, type 2 (HCC) 07/25/2014  . Essential  hypertension, benign 07/25/2014  . Annual physical exam 07/25/2014  . Hyperlipidemia 07/25/2014    Aveleen Nevers , PT 04/10/2019, 1:15 PM  [facility] [address] [place], [place], [address] Phone: [phone]   Fax:  [phone]  Name: Robert Hines MRN: [id number] Date of Birth: 22-Dec-1955

## 2019-04-12 ENCOUNTER — Other Ambulatory Visit: Payer: Self-pay

## 2019-04-12 ENCOUNTER — Ambulatory Visit (INDEPENDENT_AMBULATORY_CARE_PROVIDER_SITE_OTHER): Payer: 59 | Admitting: Rehabilitative and Restorative Service Providers"

## 2019-04-12 ENCOUNTER — Encounter: Payer: Self-pay | Admitting: Rehabilitative and Restorative Service Providers"

## 2019-04-12 DIAGNOSIS — R293 Abnormal posture: Secondary | ICD-10-CM

## 2019-04-12 DIAGNOSIS — M25612 Stiffness of left shoulder, not elsewhere classified: Secondary | ICD-10-CM | POA: Diagnosis not present

## 2019-04-12 DIAGNOSIS — R29898 Other symptoms and signs involving the musculoskeletal system: Secondary | ICD-10-CM

## 2019-04-12 DIAGNOSIS — M25512 Pain in left shoulder: Secondary | ICD-10-CM | POA: Diagnosis not present

## 2019-04-12 NOTE — Patient Instructions (Addendum)
Access Code: [email or web address]  URL: [email or web address]  Date: 04/12/2019  Prepared by: Rudell Cobb   Exercises Circular Shoulder Pendulum with Table Support - 10 reps - 1 sets - 3x daily - 7x weekly Flexion-Extension Shoulder Pendulum with Table Support - 10 reps - 1 sets - 3x daily - 7x weekly Horizontal Shoulder Pendulum with Table Support - 10 reps - 1 sets - 3x daily - 7x weekly Standing Elbow Flexion Extension AROM - 10 reps - 1 sets - 3x daily - 7x weekly Seated Gripping Towel - 10 reps - 1 sets - 3x daily - 7x weekly Seated Wrist Flexion with Dumbbell - 10 reps - 1 sets - 2x daily - 7x weekly Seated Wrist Extension with Dumbbell - 10 reps - 1 sets - 2x daily - 7x weekly

## 2019-04-12 NOTE — Therapy (Signed)
[place], [place], [address] Phone: [phone]   Fax:  [phone]  Physical Therapy Treatment  Patient Details  Name: Robert Hines MRN: [id number] Date of Birth: 1955-12-02 Referring Provider (PT): Tania Ade, MD   Encounter Date: 04/12/2019  PT End of Session - 04/12/19 1322    Visit Number  4    Number of Visits  30    Date for PT Re-Evaluation  06/27/19    PT Start Time  1016    PT Stop Time  1104    PT Time Calculation (min)  48 min    Activity Tolerance  Patient tolerated treatment well    Behavior During Therapy  [facility] for tasks assessed/performed       Past Medical History:  Diagnosis Date  . Arthritis    neck  . Depression   . Diabetes mellitus without complication ([place])    type 2  . Hyperlipidemia   . Hypertension   . Pneumonia    covid pneumonia and covid july 2020, d/ced july 16th from [place] stayed 9 days  . Renal disorder    calculi, multiple lithotripsies, stents  . Right cataract   . Rotator cuff disorder   . Spinal pain     Past Surgical History:  Procedure Laterality Date  . ANTERIOR FUSION CERVICAL SPINE  2007  . ARTHOSCOPIC ROTAOR CUFF REPAIR Left 03/21/2019   Procedure: ARTHROSCOPIC ROTATOR CUFF REPAIR;  Surgeon: Tania Ade, MD;  Location: [facility];  Service: Orthopedics;  Laterality: Left;  . EYE SURGERY Left    cataract  . FOOT SURGERY Right    pin placed and removed  . HERNIA REPAIR     inguinal  . LITHOTRIPSY     several  . SHOULDER ARTHROSCOPY WITH ROTATOR CUFF REPAIR AND SUBACROMIAL DECOMPRESSION Right 12/28/2015   Procedure: SHOULDER ARTHROSCOPY TAKE DOWN ROTATOR CUFF REPAIR AND SUBACROMIAL DECOMPRESSION, DEBRIDEMENT OF SLAP TEAR;  Surgeon: Tania Ade, MD;  Location: [place];  Service: Orthopedics;  Laterality: Right;  SHOULDER ARTHROSCOPY TAKE DOWN ROTATOR CUFF REPAIR AND SUBACROMIAL  DECOMPRESSION  . SHOULDER ARTHROSCOPY WITH SUBACROMIAL DECOMPRESSION Left 03/21/2019   Procedure: SHOULDER ARTHROSCOPY WITH SUBACROMIAL DECOMPRESSION;  Surgeon: Tania Ade, MD;  Location: [place];  Service: Orthopedics;  Laterality: Left;  [name] VASECTOMY Bilateral     There were no vitals filed for this visit.  Subjective Assessment - 04/12/19 1018    Subjective  The patient has a 2/10 pain this morning and took 1/2 a muscle relaxer this morning.    Pertinent History  Rt RTC repair 2017    Patient Stated Goals  regain function/use of Lt shoulder    Currently in Pain?  Yes    Pain Score  2     Pain Location  Shoulder    Pain Orientation  Left    Pain Descriptors / Indicators  Aching    Pain Type  Surgical pain;Acute pain    Pain Onset  1 to 4 weeks ago    Pain Frequency  Intermittent    Aggravating Factors   movement    Pain Relieving Factors  pain meds, ice                       [facility] Adult PT Treatment/Exercise - 04/12/19 1020      Exercises   Exercises  Shoulder;Elbow;Wrist      Elbow Exercises   Elbow  Flexion  Left;20 reps;AROM      Shoulder Exercises: Seated   Other Seated Exercises  scapular squeezing x 10 reps seated    Other Seated Exercises  chin tucks x 10 reps      Wrist Exercises   Wrist Flexion  Strengthening;Left;10 reps    Bar Weights/Barbell (Wrist Flexion)  1 lb    Wrist Extension  Strengthening;Left;10 reps    Bar Weights/Barbell (Wrist Extension)  1 lb      Vasopneumatic   Number Minutes Vasopneumatic   10 minutes    Vasopnuematic Location   Shoulder    Vasopneumatic Pressure  Low    Vasopneumatic Temperature   34 deg      Manual Therapy   Manual Therapy  Passive ROM    Manual therapy comments  Patient supine    Passive ROM  L shoulder PROM into flexion, ER while positioned at 20 degrees abduction with scaption.  While supine, also performed PROM for elbow extension.               PT Education -  04/12/19 1322    Education Details  HEP adding 1 lb to wrist extension/flexion    Person(s) Educated  Patient    Methods  Explanation;Demonstration;Handout    Comprehension  Verbalized understanding;Returned demonstration       PT Short Term Goals - 04/04/19 1218      PT SHORT TERM GOAL #1   Title  independent with initial HEP    Status  New    Target Date  05/02/19      PT SHORT TERM GOAL #2   Title  improve Lt shoulder PROM to limits of protocol (flexion 90 deg; er 20 deg) in preparation to move into next phase of protocol    Status  New    Target Date  05/02/19      PT SHORT TERM GOAL #3   Title  report pain < 4/10 with PROM for improved pain and tolerance    Status  New    Target Date  05/02/19        PT Long Term Goals - 04/04/19 1219      PT LONG TERM GOAL #1   Title  independent with advanced HEP    Status  New    Target Date  06/27/19      PT LONG TERM GOAL #2   Title  FOTO score improved to </= 44% limited for improved function    Status  New    Target Date  06/27/19      PT LONG TERM GOAL #3   Title  demonstrate Lt shoulder strength at least 3/5 for improved function    Status  New    Target Date  05/16/19      PT LONG TERM GOAL #4   Title  Lt shoulder AROM improved to within 5 deg of Rt shoulder for flexion, abduction and external rotation for improved function    Status  New    Target Date  06/27/19      PT LONG TERM GOAL #5   Title  report pain < 2/10 for improved function and mobility    Status  New    Target Date  06/27/19            Plan - 04/12/19 1324    Clinical Impression Statement  The patient is tolerating PROM well and feels more relaxation with elbow extension to stretch biceps musculature.  PT to continue  to work to [employer].    Comorbidities  bil RTC repairs; HTN, COVID+ 12/2018, depression, DM, OA    PT Treatment/Interventions  ADLs/Self Care Home Management;Cryotherapy;Electrical Stimulation;Ultrasound;Moist Heat;Iontophoresis  4mg /ml Dexamethasone;Therapeutic activities;Therapeutic exercise;Patient/family education;Manual techniques;Passive range of motion;Scar mobilization;Vasopneumatic Device;Taping    PT Next Visit Plan  review HEP, continue progressing per shoulder protocol    Consulted and Agree with Plan of Care  Patient       Patient will benefit from skilled therapeutic intervention in order to improve the following deficits and impairments:  Decreased range of motion, Increased fascial restricitons, Increased muscle spasms, Pain, Postural dysfunction, Decreased strength  Visit Diagnosis: Acute pain of left shoulder  Stiffness of left shoulder, not elsewhere classified  Abnormal posture  Other symptoms and signs involving the musculoskeletal system     Problem List Patient Active Problem List   Diagnosis Date Noted  . Thrombocytopenia (HCC) 02/03/2019  . Pneumonia due to COVID-19 virus 12/20/2018  . Mass of left thigh 05/24/2018  . History of DVT (deep vein thrombosis) 05/09/2018  . Pain and swelling of left lower extremity 05/09/2018  . Cold sore 04/26/2018  . Primary insomnia 10/30/2017  . S/P cervical spinal fusion 09/28/2017  . Superficial venous thrombosis of right arm 01/05/2017  . Left shoulder pain 01/05/2017  . Depression 05/02/2016  . Ingrown right big toenail 02/16/2016  . Obesity 01/23/2015  . Right shoulder pain 01/23/2015  . Former smoker 08/28/2014  . Nephrolithiasis 07/25/2014  . Diabetes mellitus, type 2 (HCC) 07/25/2014  . Essential hypertension, benign 07/25/2014  . Annual physical exam 07/25/2014  . Hyperlipidemia 07/25/2014    Robert Hines, PT 04/12/2019, 1:25 PM  [facility] [address] [place], [place], [place] Phone: [phone]   Fax:  [phone]  Name: Robert Hines MRN: [name] Date of Birth: 15-Jan-1956

## 2019-04-15 ENCOUNTER — Ambulatory Visit (INDEPENDENT_AMBULATORY_CARE_PROVIDER_SITE_OTHER): Payer: 59 | Admitting: Rehabilitative and Restorative Service Providers"

## 2019-04-15 ENCOUNTER — Encounter: Payer: Self-pay | Admitting: Rehabilitative and Restorative Service Providers"

## 2019-04-15 ENCOUNTER — Other Ambulatory Visit: Payer: Self-pay

## 2019-04-15 DIAGNOSIS — R29898 Other symptoms and signs involving the musculoskeletal system: Secondary | ICD-10-CM | POA: Diagnosis not present

## 2019-04-15 DIAGNOSIS — M25512 Pain in left shoulder: Secondary | ICD-10-CM

## 2019-04-15 DIAGNOSIS — M25612 Stiffness of left shoulder, not elsewhere classified: Secondary | ICD-10-CM | POA: Diagnosis not present

## 2019-04-15 DIAGNOSIS — R293 Abnormal posture: Secondary | ICD-10-CM | POA: Diagnosis not present

## 2019-04-15 DIAGNOSIS — M6281 Muscle weakness (generalized): Secondary | ICD-10-CM

## 2019-04-15 NOTE — Therapy (Signed)
[facility] [place] [address] [place], [place], [address] Phone: [phone]   Fax:  [phone]  Physical Therapy Treatment  Patient Details  Name: Robert Hines MRN: [id number] Date of Birth: 01/16/1956 Referring Provider (PT): Jones Broom, MD   Encounter Date: 04/15/2019  PT End of Session - 04/15/19 1301    Visit Number  5    Number of Visits  30    Date for PT Re-Evaluation  06/27/19    PT Start Time  1018    PT Stop Time  1105    PT Time Calculation (min)  47 min    Activity Tolerance  Patient tolerated treatment well    Behavior During Therapy  [facility] for tasks assessed/performed       Past Medical History:  Diagnosis Date  . Arthritis    neck  . Depression   . Diabetes mellitus without complication (HCC)    type 2  . Hyperlipidemia   . Hypertension   . Pneumonia    covid pneumonia and covid july 2020, d/ced july 16th from [facility] stayed 9 days  . Renal disorder    calculi, multiple lithotripsies, stents  . Right cataract   . Rotator cuff disorder   . Spinal pain     Past Surgical History:  Procedure Laterality Date  . ANTERIOR FUSION CERVICAL SPINE  2007  . ARTHOSCOPIC ROTAOR CUFF REPAIR Left 03/21/2019   Procedure: ARTHROSCOPIC ROTATOR CUFF REPAIR;  Surgeon: Jones Broom, MD;  Location: [facility];  Service: Orthopedics;  Laterality: Left;  . EYE SURGERY Left    cataract  . FOOT SURGERY Right    pin placed and removed  . HERNIA REPAIR     inguinal  . LITHOTRIPSY     several  . SHOULDER ARTHROSCOPY WITH ROTATOR CUFF REPAIR AND SUBACROMIAL DECOMPRESSION Right 12/28/2015   Procedure: SHOULDER ARTHROSCOPY TAKE DOWN ROTATOR CUFF REPAIR AND SUBACROMIAL DECOMPRESSION, DEBRIDEMENT OF SLAP TEAR;  Surgeon: Jones Broom, MD;  Location: [facility];  Service: Orthopedics;  Laterality: Right;  SHOULDER ARTHROSCOPY TAKE DOWN ROTATOR CUFF REPAIR AND SUBACROMIAL  DECOMPRESSION  . SHOULDER ARTHROSCOPY WITH SUBACROMIAL DECOMPRESSION Left 03/21/2019   Procedure: SHOULDER ARTHROSCOPY WITH SUBACROMIAL DECOMPRESSION;  Surgeon: Jones Broom, MD;  Location: [facility];  Service: Orthopedics;  Laterality: Left;  [name] VASECTOMY Bilateral     There were no vitals filed for this visit.  Subjective Assessment - 04/15/19 1020    Subjective  The patient reports mild discomfort with movement.  The patient notes some soreness after Friday's session.  He took 1/2 muscle relaxer this morning.    Pertinent History  Rt RTC repair 2017    Patient Stated Goals  regain function/use of Lt shoulder    Currently in Pain?  No/denies    Pain Location  Shoulder    Pain Orientation  Left    Pain Descriptors / Indicators  Aching    Pain Onset  1 to 4 weeks ago    Pain Frequency  Intermittent         [facility] PT Assessment - 04/15/19 0001      PROM   PROM Assessment Site  Shoulder    Right/Left Shoulder  Left    Left Shoulder Flexion  72 Degrees                   [facility] Adult PT Treatment/Exercise - 04/15/19 1024      Exercises   Exercises  Shoulder;Elbow;Wrist      Elbow Exercises   Elbow Flexion  Left;20 reps;AROM;10 reps    Bar Weights/Barbell (Elbow Flexion)  1 lb   added 1 lb x 10 reps for clinic only at this time.   Forearm Supination  AROM;Left;5 reps    Forearm Pronation  AROM;Left;5 reps      Shoulder Exercises: Standing   Other Standing Exercises  shoulder shrug x 10 reps, scapular retraction x 10 reps, shoulder circles x 10 reps, cervical chin tuck x 8 reps    Other Standing Exercises  pendulum front/back and laterally.      Modalities   Modalities  Vasopneumatic      Vasopneumatic   Number Minutes Vasopneumatic   10 minutes    Vasopnuematic Location   Shoulder    Vasopneumatic Pressure  Low    Vasopneumatic Temperature   34 deg      Manual Therapy   Manual Therapy  Passive ROM    Manual therapy comments  Patient  supine    Passive ROM  L shoulder PROM into flexion, ER while positioned at 20 degrees abduction with scaption.  While supine, also performed PROM for elbow extension.                 PT Short Term Goals - 04/04/19 1218      PT SHORT TERM GOAL #1   Title  independent with initial HEP    Status  New    Target Date  05/02/19      PT SHORT TERM GOAL #2   Title  improve Lt shoulder PROM to limits of protocol (flexion 90 deg; er 20 deg) in preparation to move into next phase of protocol    Status  New    Target Date  05/02/19      PT SHORT TERM GOAL #3   Title  report pain < 4/10 with PROM for improved pain and tolerance    Status  New    Target Date  05/02/19        PT Long Term Goals - 04/04/19 1219      PT LONG TERM GOAL #1   Title  independent with advanced HEP    Status  New    Target Date  06/27/19      PT LONG TERM GOAL #2   Title  FOTO score improved to </= 44% limited for improved function    Status  New    Target Date  06/27/19      PT LONG TERM GOAL #3   Title  demonstrate Lt shoulder strength at least 3/5 for improved function    Status  New    Target Date  05/16/19      PT LONG TERM GOAL #4   Title  Lt shoulder AROM improved to within 5 deg of Rt shoulder for flexion, abduction and external rotation for improved function    Status  New    Target Date  06/27/19      PT LONG TERM GOAL #5   Title  report pain < 2/10 for improved function and mobility    Status  New    Target Date  06/27/19            Plan - 04/15/19 1303    Clinical Impression Statement  The patient is tolerating increased PROM into flexion today. After multiple reps of PROM, he did begin to complain of soreness that reduces with elbow extension to stretch biceps tendon.  Continue progressing according to protocol.    PT Treatment/Interventions  ADLs/Self Care Home Management;Cryotherapy;Electrical Stimulation;Ultrasound;Moist Heat;Iontophoresis 4mg /ml  Dexamethasone;Therapeutic activities;Therapeutic exercise;Patient/family education;Manual techniques;Passive range of motion;Scar mobilization;Vasopneumatic Device;Taping    PT Next Visit Plan  review HEP, continue progressing per shoulder protocol    PT Home Exercise Plan  Access Code: [email or web address]    Consulted and Agree with Plan of Care  Patient       Patient will benefit from skilled therapeutic intervention in order to improve the following deficits and impairments:  Decreased range of motion, Increased fascial restricitons, Increased muscle spasms, Pain, Postural dysfunction, Decreased strength  Visit Diagnosis: Acute pain of left shoulder  Stiffness of left shoulder, not elsewhere classified  Abnormal posture  Other symptoms and signs involving the musculoskeletal system  Muscle weakness (generalized)     Problem List Patient Active Problem List   Diagnosis Date Noted  . Thrombocytopenia (HCC) 02/03/2019  . Pneumonia due to COVID-19 virus 12/20/2018  . Mass of left thigh 05/24/2018  . History of DVT (deep vein thrombosis) 05/09/2018  . Pain and swelling of left lower extremity 05/09/2018  . Cold sore 04/26/2018  . Primary insomnia 10/30/2017  . S/P cervical spinal fusion 09/28/2017  . Superficial venous thrombosis of right arm 01/05/2017  . Left shoulder pain 01/05/2017  . Depression 05/02/2016  . Ingrown right big toenail 02/16/2016  . Obesity 01/23/2015  . Right shoulder pain 01/23/2015  . Former smoker 08/28/2014  . Nephrolithiasis 07/25/2014  . Diabetes mellitus, type 2 (HCC) 07/25/2014  . Essential hypertension, benign 07/25/2014  . Annual physical exam 07/25/2014  . Hyperlipidemia 07/25/2014    Ishaq Maffei,PT 04/15/2019, 1:04 PM  [facility] [address] [place], [place], [address] Phone: [phone]   Fax:  [phone]  Name: Robert Hines MRN: [id number] Date of Birth:  1956/03/27

## 2019-04-17 ENCOUNTER — Encounter: Payer: Self-pay | Admitting: Physical Therapy

## 2019-04-17 ENCOUNTER — Ambulatory Visit (INDEPENDENT_AMBULATORY_CARE_PROVIDER_SITE_OTHER): Payer: 59 | Admitting: Physical Therapy

## 2019-04-17 ENCOUNTER — Other Ambulatory Visit: Payer: Self-pay

## 2019-04-17 DIAGNOSIS — M25512 Pain in left shoulder: Secondary | ICD-10-CM

## 2019-04-17 DIAGNOSIS — R293 Abnormal posture: Secondary | ICD-10-CM | POA: Diagnosis not present

## 2019-04-17 DIAGNOSIS — M25612 Stiffness of left shoulder, not elsewhere classified: Secondary | ICD-10-CM

## 2019-04-17 NOTE — Therapy (Signed)
[facility] [place] [address] [place], [place], [address] Phone: [phone]   Fax:  [phone]  Physical Therapy Treatment  Patient Details  Name: Robert Hines MRN: [id number] Date of Birth: June 06, 1956 Referring Provider (PT): Jones Broom, MD   Encounter Date: 04/17/2019  PT End of Session - 04/17/19 1018    Visit Number  6    Number of Visits  30    Date for PT Re-Evaluation  06/27/19    PT Start Time  1018    PT Stop Time  1104    PT Time Calculation (min)  46 min    Activity Tolerance  Patient tolerated treatment well    Behavior During Therapy  [facility] for tasks assessed/performed       Past Medical History:  Diagnosis Date  . Arthritis    neck  . Depression   . Diabetes mellitus without complication (HCC)    type 2  . Hyperlipidemia   . Hypertension   . Pneumonia    covid pneumonia and covid july 2020, d/ced july 16th from [facility] stayed 9 days  . Renal disorder    calculi, multiple lithotripsies, stents  . Right cataract   . Rotator cuff disorder   . Spinal pain     Past Surgical History:  Procedure Laterality Date  . ANTERIOR FUSION CERVICAL SPINE  2007  . ARTHOSCOPIC ROTAOR CUFF REPAIR Left 03/21/2019   Procedure: ARTHROSCOPIC ROTATOR CUFF REPAIR;  Surgeon: Jones Broom, MD;  Location: [facility];  Service: Orthopedics;  Laterality: Left;  . EYE SURGERY Left    cataract  . FOOT SURGERY Right    pin placed and removed  . HERNIA REPAIR     inguinal  . LITHOTRIPSY     several  . SHOULDER ARTHROSCOPY WITH ROTATOR CUFF REPAIR AND SUBACROMIAL DECOMPRESSION Right 12/28/2015   Procedure: SHOULDER ARTHROSCOPY TAKE DOWN ROTATOR CUFF REPAIR AND SUBACROMIAL DECOMPRESSION, DEBRIDEMENT OF SLAP TEAR;  Surgeon: Jones Broom, MD;  Location: [facility];  Service: Orthopedics;  Laterality: Right;  SHOULDER ARTHROSCOPY TAKE DOWN ROTATOR CUFF REPAIR AND SUBACROMIAL  DECOMPRESSION  . SHOULDER ARTHROSCOPY WITH SUBACROMIAL DECOMPRESSION Left 03/21/2019   Procedure: SHOULDER ARTHROSCOPY WITH SUBACROMIAL DECOMPRESSION;  Surgeon: Jones Broom, MD;  Location[facility];  Service: Orthopedics;  Laterality: Left;  [name] VASECTOMY Bilateral     There were no vitals filed for this visit.  Subjective Assessment - 04/17/19 1020    Subjective  He states he has been doing well and is continuing to take 1/2 muscle relaxer in the morning.    Currently in Pain?  No/denies         [facility] PT Assessment - 04/17/19 0001      Assessment   Medical Diagnosis  RTC repair    Referring Provider (PT)  Jones Broom, MD    Onset Date/Surgical Date  03/21/19    Hand Dominance  Right    Next MD Visit  05/01/19    Prior Therapy  following Rt RTC repair       [facility] Adult PT Treatment/Exercise - 04/17/19 0001      Shoulder Exercises: Standing   Other Standing Exercises  scap squeezes: 10 reps, 5sec hold, with back against pool noodle    Other Standing Exercises  Lt pendulum horizontal/vertical/CW x 10reps      Vasopneumatic   Number Minutes Vasopneumatic   10 minutes    Vasopnuematic Location   Shoulder   Lt  Vasopneumatic Pressure  Low    Vasopneumatic Temperature   34 deg      Manual Therapy   Manual Therapy  Passive ROM;Soft tissue mobilization;Scapular mobilization    Manual therapy comments  Patient supine    Soft tissue mobilization  STM to Lt upper trap, pec, periscapular, cervical paraspinals    Scapular Mobilization  Lt superior/inferior and medial/lateral    Passive ROM  Lt shoulder PROM into flex, external rotation positioned in scaption, scaption, internal rotation to protocol limits and no pain                PT Short Term Goals - 04/04/19 1218      PT SHORT TERM GOAL #1   Title  independent with initial HEP    Status  New    Target Date  05/02/19      PT SHORT TERM GOAL #2   Title  improve Lt shoulder PROM to limits  of protocol (flexion 90 deg; er 20 deg) in preparation to move into next phase of protocol    Status  New    Target Date  05/02/19      PT SHORT TERM GOAL #3   Title  report pain < 4/10 with PROM for improved pain and tolerance    Status  New    Target Date  05/02/19        PT Long Term Goals - 04/04/19 1219      PT LONG TERM GOAL #1   Title  independent with advanced HEP    Status  New    Target Date  06/27/19      PT LONG TERM GOAL #2   Title  FOTO score improved to </= 44% limited for improved function    Status  New    Target Date  06/27/19      PT LONG TERM GOAL #3   Title  demonstrate Lt shoulder strength at least 3/5 for improved function    Status  New    Target Date  05/16/19      PT LONG TERM GOAL #4   Title  Lt shoulder AROM improved to within 5 deg of Rt shoulder for flexion, abduction and external rotation for improved function    Status  New    Target Date  06/27/19      PT LONG TERM GOAL #5   Title  report pain < 2/10 for improved function and mobility    Status  New    Target Date  06/27/19            Plan - 04/17/19 1058    Clinical Impression Statement  Pt slightly guarded during PROM; range kept within protocol limits no pain. Flexion PROM gradually improving; nearing 90 deg. Pt discomfort treated with vasopneumatic at end of session. Continues to make progress toward goals; will benefit from skilled PT intervention to improve functional mobility.    Rehab Potential  Good    PT Frequency  3x / week    PT Duration  12 weeks    PT Treatment/Interventions  ADLs/Self Care Home Management;Cryotherapy;Electrical Stimulation;Ultrasound;Moist Heat;Iontophoresis 4mg /ml Dexamethasone;Therapeutic activities;Therapeutic exercise;Patient/family education;Manual techniques;Passive range of motion;Scar mobilization;Vasopneumatic Device;Taping    PT Next Visit Plan  AAROM exercises in supine, PROM to pt tolerance, continue progressing per shoulder protocol     PT Home Exercise Plan  Access Code: [email or web address]    Consulted and Agree with Plan of Care  Patient       Patient will  benefit from skilled therapeutic intervention in order to improve the following deficits and impairments:  Decreased range of motion, Increased fascial restricitons, Increased muscle spasms, Pain, Postural dysfunction, Decreased strength  Visit Diagnosis: Acute pain of left shoulder  Stiffness of left shoulder, not elsewhere classified  Abnormal posture     Problem List Patient Active Problem List   Diagnosis Date Noted  . Thrombocytopenia (HCC) 02/03/2019  . Pneumonia due to COVID-19 virus 12/20/2018  . Mass of left thigh 05/24/2018  . History of DVT (deep vein thrombosis) 05/09/2018  . Pain and swelling of left lower extremity 05/09/2018  . Cold sore 04/26/2018  . Primary insomnia 10/30/2017  . S/P cervical spinal fusion 09/28/2017  . Superficial venous thrombosis of right arm 01/05/2017  . Left shoulder pain 01/05/2017  . Depression 05/02/2016  . Ingrown right big toenail 02/16/2016  . Obesity 01/23/2015  . Right shoulder pain 01/23/2015  . Former smoker 08/28/2014  . Nephrolithiasis 07/25/2014  . Diabetes mellitus, type 2 (HCC) 07/25/2014  . Essential hypertension, benign 07/25/2014  . Annual physical exam 07/25/2014  . Hyperlipidemia 07/25/2014    Olive BassKirsten Hamad Whyte, SPTA 04/17/2019, 12:19 PM  Mayer CamelJennifer Carlson-Long, PTA 04/17/19 12:19 PM  [facility] [address] [place], [place], [address] Phone: [phone]   Fax:  [phone]  Name: Robert Hines MRN: [id number] Date of Birth: June 07, 1956

## 2019-04-19 ENCOUNTER — Encounter: Payer: Self-pay | Admitting: Physical Therapy

## 2019-04-19 ENCOUNTER — Other Ambulatory Visit: Payer: Self-pay

## 2019-04-19 ENCOUNTER — Ambulatory Visit (INDEPENDENT_AMBULATORY_CARE_PROVIDER_SITE_OTHER): Payer: 59 | Admitting: Physical Therapy

## 2019-04-19 DIAGNOSIS — M6281 Muscle weakness (generalized): Secondary | ICD-10-CM | POA: Diagnosis not present

## 2019-04-19 DIAGNOSIS — M25512 Pain in left shoulder: Secondary | ICD-10-CM

## 2019-04-19 DIAGNOSIS — M25612 Stiffness of left shoulder, not elsewhere classified: Secondary | ICD-10-CM

## 2019-04-19 NOTE — Therapy (Addendum)
[facility] [place] [address] [place], [place], [address] Phone: [phone]   Fax:  [phone]  Physical Therapy Treatment  Patient Details  Name: Robert Hines MRN: [id number] Date of Birth: Jul 24, 1955 Referring Provider (PT): Jones Broom, MD   Encounter Date: 04/19/2019  PT End of Session - 04/19/19 1012    Visit Number  7    Number of Visits  30    Date for PT Re-Evaluation  06/27/19    PT Start Time  1014    PT Stop Time  1100    PT Time Calculation (min)  46 min    Activity Tolerance  Patient tolerated treatment well    Behavior During Therapy  [facility] for tasks assessed/performed       Past Medical History:  Diagnosis Date  . Arthritis    neck  . Depression   . Diabetes mellitus without complication (HCC)    type 2  . Hyperlipidemia   . Hypertension   . Pneumonia    covid pneumonia and covid july 2020, d/ced july 16th from [facility] stayed 9 days  . Renal disorder    calculi, multiple lithotripsies, stents  . Right cataract   . Rotator cuff disorder   . Spinal pain     Past Surgical History:  Procedure Laterality Date  . ANTERIOR FUSION CERVICAL SPINE  2007  . ARTHOSCOPIC ROTAOR CUFF REPAIR Left 03/21/2019   Procedure: ARTHROSCOPIC ROTATOR CUFF REPAIR;  Surgeon: Jones Broom, MD;  Location: [facility];  Service: Orthopedics;  Laterality: Left;  . EYE SURGERY Left    cataract  . FOOT SURGERY Right    pin placed and removed  . HERNIA REPAIR     inguinal  . LITHOTRIPSY     several  . SHOULDER ARTHROSCOPY WITH ROTATOR CUFF REPAIR AND SUBACROMIAL DECOMPRESSION Right 12/28/2015   Procedure: SHOULDER ARTHROSCOPY TAKE DOWN ROTATOR CUFF REPAIR AND SUBACROMIAL DECOMPRESSION, DEBRIDEMENT OF SLAP TEAR;  Surgeon: Jones Broom, MD;  Location: [facility];  Service: Orthopedics;  Laterality: Right;  SHOULDER ARTHROSCOPY TAKE DOWN ROTATOR CUFF REPAIR AND SUBACROMIAL  DECOMPRESSION  . SHOULDER ARTHROSCOPY WITH SUBACROMIAL DECOMPRESSION Left 03/21/2019   Procedure: SHOULDER ARTHROSCOPY WITH SUBACROMIAL DECOMPRESSION;  Surgeon: Jones Broom, MD;  Location: [facility];  Service: Orthopedics;  Laterality: Left;  [name] VASECTOMY Bilateral     There were no vitals filed for this visit.  Subjective Assessment - 04/19/19 1015    Subjective  "I have had some pain since wednesday. I took a 1/2 a muscle relaxer this morning."  Pt reporting increased pain (more constant than before) in Lt shoulder since last visit.    Pain Score  2     Pain Location  Shoulder    Pain Orientation  Left    Pain Descriptors / Indicators  Aching;Nagging    Pain Type  Acute pain;Surgical pain    Aggravating Factors   movement    Pain Relieving Factors  pain meds, muscle relaxers, ice         [facility] PT Assessment - 04/19/19 0001      Assessment   Medical Diagnosis  RTC repair    Referring Provider (PT)  Jones Broom, MD    Onset Date/Surgical Date  03/21/19    Hand Dominance  Right    Next MD Visit  05/01/19    Prior Therapy  following Rt RTC repair        [facility] Adult PT Treatment/Exercise -  04/19/19 0001      Self-Care   Self-Care  Scar Mobilizations    Scar Mobilizations  Pt educated on scar mobilizations; pt gave verbal understanding      Shoulder Exercises: Seated   Other Seated Exercises  scapular squeezing x 10 reps , 5sec hold      Shoulder Exercises: Standing   Other Standing Exercises  Lt pendulum horizontal/CW x 5 reps      Modalities   Modalities  Electrical Stimulation      Electrical Stimulation   Electrical Stimulation Location  Lt Shoulder    Electrical Stimulation Action  IFC    Electrical Stimulation Parameters  to tolerance     Electrical Stimulation Goals  Pain      Vasopneumatic   Number Minutes Vasopneumatic   10 minutes    Vasopnuematic Location   Shoulder   Lt   Vasopneumatic Pressure  Low    Vasopneumatic  Temperature   34 deg      Manual Therapy   Manual Therapy  Soft tissue mobilization;Passive ROM;Myofascial release    Manual therapy comments  Patient supine    Soft tissue mobilization  STM to Lt upper trap, deltoid, pec, periscapular, cervical paraspinals    Myofascial Release  to Lt distal bicep     Passive ROM  Lt shoulder PROM into flex, external rot., internal rot., scaption to protocol limits with no pain; prolonged stretch to Lt biceps tendon               PT Short Term Goals - 04/04/19 1218      PT SHORT TERM GOAL #1   Title  independent with initial HEP    Status  New    Target Date  05/02/19      PT SHORT TERM GOAL #2   Title  improve Lt shoulder PROM to limits of protocol (flexion 90 deg; er 20 deg) in preparation to move into next phase of protocol    Status  New    Target Date  05/02/19      PT SHORT TERM GOAL #3   Title  report pain < 4/10 with PROM for improved pain and tolerance    Status  New    Target Date  05/02/19        PT Long Term Goals - 04/04/19 1219      PT LONG TERM GOAL #1   Title  independent with advanced HEP    Status  New    Target Date  06/27/19      PT LONG TERM GOAL #2   Title  FOTO score improved to </= 44% limited for improved function    Status  New    Target Date  06/27/19      PT LONG TERM GOAL #3   Title  demonstrate Lt shoulder strength at least 3/5 for improved function    Status  New    Target Date  05/16/19      PT LONG TERM GOAL #4   Title  Lt shoulder AROM improved to within 5 deg of Rt shoulder for flexion, abduction and external rotation for improved function    Status  New    Target Date  06/27/19      PT LONG TERM GOAL #5   Title  report pain < 2/10 for improved function and mobility    Status  New    Target Date  06/27/19  Plan - 04/19/19 1116    Clinical Impression Statement  Pt continues to have tightness in Lt bicep. He continues to be slightly gurarded during PROM exercises;  range kept in protocol limits with no pain. Noted muscles spasms with PROM into flexion. Progressing towards STG #2. Will continue to benefit from skilled PT intervention to improve functional mobility.    Rehab Potential  Good    PT Frequency  3x / week    PT Duration  12 weeks    PT Treatment/Interventions  ADLs/Self Care Home Management;Cryotherapy;Electrical Stimulation;Ultrasound;Moist Heat;Iontophoresis 4mg /ml Dexamethasone;Therapeutic activities;Therapeutic exercise;Patient/family education;Manual techniques;Passive range of motion;Scar mobilization;Vasopneumatic Device;Taping    PT Next Visit Plan  Continue progressing shoulder per protocol    PT Home Exercise Plan  Access Code: [email or web address]       Patient will benefit from skilled therapeutic intervention in order to improve the following deficits and impairments:  Decreased range of motion, Increased fascial restricitons, Increased muscle spasms, Pain, Postural dysfunction, Decreased strength  Visit Diagnosis: Acute pain of left shoulder  Stiffness of left shoulder, not elsewhere classified  Muscle weakness (generalized)     Problem List Patient Active Problem List   Diagnosis Date Noted  . Thrombocytopenia (HCC) 02/03/2019  . Pneumonia due to COVID-19 virus 12/20/2018  . Mass of left thigh 05/24/2018  . History of DVT (deep vein thrombosis) 05/09/2018  . Pain and swelling of left lower extremity 05/09/2018  . Cold sore 04/26/2018  . Primary insomnia 10/30/2017  . S/P cervical spinal fusion 09/28/2017  . Superficial venous thrombosis of right arm 01/05/2017  . Left shoulder pain 01/05/2017  . Depression 05/02/2016  . Ingrown right big toenail 02/16/2016  . Obesity 01/23/2015  . Right shoulder pain 01/23/2015  . Former smoker 08/28/2014  . Nephrolithiasis 07/25/2014  . Diabetes mellitus, type 2 (HCC) 07/25/2014  . Essential hypertension, benign 07/25/2014  . Annual physical exam 07/25/2014  . Hyperlipidemia 07/25/2014     Olive BassKirsten Diamonds Lippard, SPTA 04/19/2019, 1:08 PM  Mayer CamelJennifer Carlson-Long, PTA 04/19/19 1:08 PM  [facility] [place] [address] [place], [place], [address] Phone: [phone]   Fax:  [phone]  Name: Robert Hines MRN: [id number] Date of Birth: Oct 31, 1955

## 2019-04-22 ENCOUNTER — Encounter: Payer: Self-pay | Admitting: Physical Therapy

## 2019-04-22 ENCOUNTER — Other Ambulatory Visit: Payer: Self-pay

## 2019-04-22 ENCOUNTER — Ambulatory Visit (INDEPENDENT_AMBULATORY_CARE_PROVIDER_SITE_OTHER): Payer: 59 | Admitting: Physical Therapy

## 2019-04-22 DIAGNOSIS — M25512 Pain in left shoulder: Secondary | ICD-10-CM

## 2019-04-22 DIAGNOSIS — M25612 Stiffness of left shoulder, not elsewhere classified: Secondary | ICD-10-CM

## 2019-04-22 DIAGNOSIS — G8929 Other chronic pain: Secondary | ICD-10-CM

## 2019-04-22 NOTE — Therapy (Signed)
[place] [address] [place], [place], [address] Phone: [phone]   Fax:  [phone]  Physical Therapy Treatment  Patient Details  Name: Robert Hines MRN: [id number] Date of Birth: January 13, 1956 Referring Provider (PT): Tania Ade, MD   Encounter Date: 04/22/2019  PT End of Session - 04/22/19 1013    Visit Number  8    Number of Visits  30    Date for PT Re-Evaluation  06/27/19    PT Start Time  1013    PT Stop Time  1055    PT Time Calculation (min)  42 min    Activity Tolerance  Patient tolerated treatment well;No increased pain    Behavior During Therapy  WFL for tasks assessed/performed       Past Medical History:  Diagnosis Date  . Arthritis    neck  . Depression   . Diabetes mellitus without complication ([place])    type 2  . Hyperlipidemia   . Hypertension   . Pneumonia    covid pneumonia and covid july 2020, d/ced july 16th from [place] stayed 9 days  . Renal disorder    calculi, multiple lithotripsies, stents  . Right cataract   . Rotator cuff disorder   . Spinal pain     Past Surgical History:  Procedure Laterality Date  . ANTERIOR FUSION CERVICAL SPINE  2007  . ARTHOSCOPIC ROTAOR CUFF REPAIR Left 03/21/2019   Procedure: ARTHROSCOPIC ROTATOR CUFF REPAIR;  Surgeon: Tania Ade, MD;  Location: [facility];  Service: Orthopedics;  Laterality: Left;  . EYE SURGERY Left    cataract  . FOOT SURGERY Right    pin placed and removed  . HERNIA REPAIR     inguinal  . LITHOTRIPSY     several  . SHOULDER ARTHROSCOPY WITH ROTATOR CUFF REPAIR AND SUBACROMIAL DECOMPRESSION Right 12/28/2015   Procedure: SHOULDER ARTHROSCOPY TAKE DOWN ROTATOR CUFF REPAIR AND SUBACROMIAL DECOMPRESSION, DEBRIDEMENT OF SLAP TEAR;  Surgeon: Tania Ade, MD;  Location: [place];  Service: Orthopedics;  Laterality: Right;  SHOULDER ARTHROSCOPY TAKE DOWN ROTATOR CUFF REPAIR  AND SUBACROMIAL DECOMPRESSION  . SHOULDER ARTHROSCOPY WITH SUBACROMIAL DECOMPRESSION Left 03/21/2019   Procedure: SHOULDER ARTHROSCOPY WITH SUBACROMIAL DECOMPRESSION;  Surgeon: Tania Ade, MD;  Location: [place];  Service: Orthopedics;  Laterality: Left;  [name] VASECTOMY Bilateral     There were no vitals filed for this visit.  Subjective Assessment - 04/22/19 1015    Subjective  Pt reports having Lt shoulder pain over the weekend but it is better today.    Currently in Pain?  No/denies   Pain with movement        [facility] PT Assessment - 04/22/19 0001      PROM   Left Shoulder Flexion  81 Degrees    Left Shoulder External Rotation  10 Degrees       [facility] Adult PT Treatment/Exercise - 04/22/19 0001      Vasopneumatic   Number Minutes Vasopneumatic   10 minutes    Vasopnuematic Location   Shoulder   Lt   Vasopneumatic Pressure  Low    Vasopneumatic Temperature   34 deg      Manual Therapy   Manual therapy comments  Patient supine    Soft tissue mobilization  STM to Lt pec    Myofascial Release  Lt pec    Passive ROM  Lt shoulder PROM into flex, ext, external rot., internal rot., scaption to  protocol limits with no pain; IASTM to Lt bicep and forearm to decrease fascial tightness       PT Short Term Goals - 04/04/19 1218      PT SHORT TERM GOAL #1   Title  independent with initial HEP    Status  New    Target Date  05/02/19      PT SHORT TERM GOAL #2   Title  improve Lt shoulder PROM to limits of protocol (flexion 90 deg; er 20 deg) in preparation to move into next phase of protocol    Status  New    Target Date  05/02/19      PT SHORT TERM GOAL #3   Title  report pain < 4/10 with PROM for improved pain and tolerance    Status  New    Target Date  05/02/19        PT Long Term Goals - 04/04/19 1219      PT LONG TERM GOAL #1   Title  independent with advanced HEP    Status  New    Target Date  06/27/19      PT LONG TERM GOAL #2   Title   FOTO score improved to </= 44% limited for improved function    Status  New    Target Date  06/27/19      PT LONG TERM GOAL #3   Title  demonstrate Lt shoulder strength at least 3/5 for improved function    Status  New    Target Date  05/16/19      PT LONG TERM GOAL #4   Title  Lt shoulder AROM improved to within 5 deg of Rt shoulder for flexion, abduction and external rotation for improved function    Status  New    Target Date  06/27/19      PT LONG TERM GOAL #5   Title  report pain < 2/10 for improved function and mobility    Status  New    Target Date  06/27/19            Plan - 04/22/19 1200    Clinical Impression Statement  Pt continues to have tightness in Lt bicep; improved with IASTM. Pt more relaxed during PROM with frequent muscle spasms in shoulder flexion; range kept in protocol limits with no pain. He continues to make gradual progress toward all goals.    Rehab Potential  Good    PT Frequency  3x / week    PT Duration  12 weeks    PT Treatment/Interventions  ADLs/Self Care Home Management;Cryotherapy;Electrical Stimulation;Ultrasound;Moist Heat;Iontophoresis 4mg /ml Dexamethasone;Therapeutic activities;Therapeutic exercise;Patient/family education;Manual techniques;Passive range of motion;Scar mobilization;Vasopneumatic Device;Taping    PT Next Visit Plan  Continue progressing shoulder per protocol    PT Home Exercise Plan  Access Code:       Patient will benefit from skilled therapeutic intervention in order to improve the following deficits and impairments:  Decreased range of motion, Increased fascial restricitons, Increased muscle spasms, Pain, Postural dysfunction, Decreased strength  Visit Diagnosis: Acute pain of left shoulder  Stiffness of left shoulder, not elsewhere classified  Chronic left shoulder pain     Problem List Patient Active Problem List   Diagnosis Date Noted  . Thrombocytopenia (HCC) 02/03/2019  . Pneumonia due to  COVID-19 virus 12/20/2018  . Mass of left thigh 05/24/2018  . History of DVT (deep vein thrombosis) 05/09/2018  . Pain and swelling of left lower extremity 05/09/2018  . Cold  sore 04/26/2018  . Primary insomnia 10/30/2017  . S/P cervical spinal fusion 09/28/2017  . Superficial venous thrombosis of right arm 01/05/2017  . Left shoulder pain 01/05/2017  . Depression 05/02/2016  . Ingrown right big toenail 02/16/2016  . Obesity 01/23/2015  . Right shoulder pain 01/23/2015  . Former smoker 08/28/2014  . Nephrolithiasis 07/25/2014  . Diabetes mellitus, type 2 (HCC) 07/25/2014  . Essential hypertension, benign 07/25/2014  . Annual physical exam 07/25/2014  . Hyperlipidemia 07/25/2014    Olive BassKirsten Dezmon [place]over, SPTA 04/22/2019, 4:23 PM   Mayer CamelJennifer Carlson-Long, PTA 04/22/19 4:23 PM   [facility] [address] [place], [place], [address] Phone: [phone]   Fax:  [phone]  Name: Robert Hines MRN: [id number] Date of Birth: Dec 31, 1955

## 2019-04-24 ENCOUNTER — Other Ambulatory Visit: Payer: Self-pay

## 2019-04-24 ENCOUNTER — Ambulatory Visit (INDEPENDENT_AMBULATORY_CARE_PROVIDER_SITE_OTHER): Payer: 59 | Admitting: Rehabilitative and Restorative Service Providers"

## 2019-04-24 DIAGNOSIS — G8929 Other chronic pain: Secondary | ICD-10-CM

## 2019-04-24 DIAGNOSIS — R29898 Other symptoms and signs involving the musculoskeletal system: Secondary | ICD-10-CM

## 2019-04-24 DIAGNOSIS — M25512 Pain in left shoulder: Secondary | ICD-10-CM

## 2019-04-24 DIAGNOSIS — M25612 Stiffness of left shoulder, not elsewhere classified: Secondary | ICD-10-CM | POA: Diagnosis not present

## 2019-04-24 DIAGNOSIS — M6281 Muscle weakness (generalized): Secondary | ICD-10-CM | POA: Diagnosis not present

## 2019-04-24 DIAGNOSIS — R293 Abnormal posture: Secondary | ICD-10-CM

## 2019-04-24 NOTE — Therapy (Signed)
[facility] [address] [address] [place], [place], [address] Phone: [phone]   Fax:  [phone]  Physical Therapy Treatment  Patient Details  Name: Robert BertinGary E Hines MRN: [id number] Date of Birth: Sep 22, 1955 Referring Provider (PT): Jones BroomJustin Chandler, MD   Encounter Date: 04/24/2019  PT End of Session - 04/24/19 1310    Visit Number  9    Number of Visits  30    Date for PT Re-Evaluation  06/27/19    PT Start Time  1019    PT Stop Time  1110    PT Time Calculation (min)  51 min    Activity Tolerance  Patient tolerated treatment well;No increased pain    Behavior During Therapy  WFL for tasks assessed/performed       Past Medical History:  Diagnosis Date  . Arthritis    neck  . Depression   . Diabetes mellitus without complication (HCC)    type 2  . Hyperlipidemia   . Hypertension   . Pneumonia    covid pneumonia and covid july 2020, d/ced july 16th from [facility] stayed 9 days  . Renal disorder    calculi, multiple lithotripsies, stents  . Right cataract   . Rotator cuff disorder   . Spinal pain     Past Surgical History:  Procedure Laterality Date  . ANTERIOR FUSION CERVICAL SPINE  2007  . ARTHOSCOPIC ROTAOR CUFF REPAIR Left 03/21/2019   Procedure: ARTHROSCOPIC ROTATOR CUFF REPAIR;  Surgeon: Jones Broomhandler, Justin, MD;  [facility];  Service: Orthopedics;  Laterality: Left;  . EYE SURGERY Left    cataract  . FOOT SURGERY Right    pin placed and removed  . HERNIA REPAIR     inguinal  . LITHOTRIPSY     several  . SHOULDER ARTHROSCOPY WITH ROTATOR CUFF REPAIR AND SUBACROMIAL DECOMPRESSION Right 12/28/2015   Procedure: SHOULDER ARTHROSCOPY TAKE DOWN ROTATOR CUFF REPAIR AND SUBACROMIAL DECOMPRESSION, DEBRIDEMENT OF SLAP TEAR;  Surgeon: Jones BroomJustin Chandler, MD;  Location: [facility];  Service: Orthopedics;  Laterality: Right;  SHOULDER ARTHROSCOPY TAKE DOWN ROTATOR CUFF REPAIR  AND SUBACROMIAL DECOMPRESSION  . SHOULDER ARTHROSCOPY WITH SUBACROMIAL DECOMPRESSION Left 03/21/2019   Procedure: SHOULDER ARTHROSCOPY WITH SUBACROMIAL DECOMPRESSION;  Surgeon: Jones Broomhandler, Justin, MD;  [facility];  Service: Orthopedics;  Laterality: Left;  [name]. VASECTOMY Bilateral     There were no vitals filed for this visit.  Subjective Assessment - 04/24/19 1022    Subjective  The patient reports heis not as sore and feels the cold/rainy weather may have influenced his pain.  He is taking arm out of the sling some at home to allow biceps tendon to stretch.    Pertinent History  Rt RTC repair 2017    Patient Stated Goals  regain function/use of Lt shoulder         [facility] PT Assessment - 04/24/19 1316      ROM / Strength   AROM / PROM / Strength  PROM      PROM   Left Shoulder Flexion  90 Degrees    Left Shoulder External Rotation  15 Degrees                   [facility] Adult PT Treatment/Exercise - 04/24/19 1311      Self-Care   Self-Care  Other Self-Care Comments    Other Self-Care Comments   educated patient on removing sling beginning tomorrow more frequently t/o the day  to get elbow extension.  Recommended he wear when being more active.      Exercises   Exercises  Shoulder;Elbow;Wrist      Elbow Exercises   Elbow Flexion  PROM;Left    Elbow Flexion Limitations  during PROM for shoulder used to stretch biceps tendon with elbow extension      Shoulder Exercises: Supine   External Rotation  PROM;Left;12 reps    External Rotation Limitations  initially with towel under L elbow performing shoulder external rotation with arm abducted to 15 degrees.  REmoved towel and got greater increase in ER range after performing supine scapular retraction    Flexion  PROM;Left;12 reps    Flexion Limitations  measured to 90 degrees flexion today.    Other Supine Exercises  shoulder squeeze x 10 reps      Modalities   Modalities  Vasopneumatic       Vasopneumatic   Number Minutes Vasopneumatic   10 minutes    Vasopnuematic Location   Shoulder    Vasopneumatic Pressure  Low    Vasopneumatic Temperature   34 deg      Manual Therapy   Manual Therapy  Soft tissue mobilization    Manual therapy comments  Patient supine    Soft tissue mobilization  STM to left pec, L biceps and neural stretch for forearm/elbow.               PT Short Term Goals - 04/04/19 1218      PT SHORT TERM GOAL #1   Title  independent with initial HEP    Status  New    Target Date  05/02/19      PT SHORT TERM GOAL #2   Title  improve Lt shoulder PROM to limits of protocol (flexion 90 deg; er 20 deg) in preparation to move into next phase of protocol    Status  New    Target Date  05/02/19      PT SHORT TERM GOAL #3   Title  report pain < 4/10 with PROM for improved pain and tolerance    Status  New    Target Date  05/02/19        PT Long Term Goals - 04/04/19 1219      PT LONG TERM GOAL #1   Title  independent with advanced HEP    Status  New    Target Date  06/27/19      PT LONG TERM GOAL #2   Title  FOTO score improved to </= 44% limited for improved function    Status  New    Target Date  06/27/19      PT LONG TERM GOAL #3   Title  demonstrate Lt shoulder strength at least 3/5 for improved function    Status  New    Target Date  05/16/19      PT LONG TERM GOAL #4   Title  Lt shoulder AROM improved to within 5 deg of Rt shoulder for flexion, abduction and external rotation for improved function    Status  New    Target Date  06/27/19      PT LONG TERM GOAL #5   Title  report pain < 2/10 for improved function and mobility    Status  New    Target Date  06/27/19            Plan - 04/24/19 1317    Clinical Impression Statement  The patient responds well  to stretching and myofascial release of biceps tendon before stretching.  He reached 90 degrees PROM flexion today and 15 degrees PROM ER.    PT Treatment/Interventions   ADLs/Self Care Home Management;Cryotherapy;Electrical Stimulation;Ultrasound;Moist Heat;Iontophoresis 4mg /ml Dexamethasone;Therapeutic activities;Therapeutic exercise;Patient/family education;Manual techniques;Passive range of motion;Scar mobilization;Vasopneumatic Device;Taping    PT Next Visit Plan  Continue progressing shoulder per protocol    PT Home Exercise Plan  Access Code:    Consulted and Agree with Plan of Care  Patient       Patient will benefit from skilled therapeutic intervention in order to improve the following deficits and impairments:  Decreased range of motion, Increased fascial restricitons, Increased muscle spasms, Pain, Postural dysfunction, Decreased strength  Visit Diagnosis: Acute pain of left shoulder  Stiffness of left shoulder, not elsewhere classified  Chronic left shoulder pain  Muscle weakness (generalized)  Abnormal posture  Other symptoms and signs involving the musculoskeletal system     Problem List Patient Active Problem List   Diagnosis Date Noted  . Thrombocytopenia (HCC) 02/03/2019  . Pneumonia due to COVID-19 virus 12/20/2018  . Mass of left thigh 05/24/2018  . History of DVT (deep vein thrombosis) 05/09/2018  . Pain and swelling of left lower extremity 05/09/2018  . Cold sore 04/26/2018  . Primary insomnia 10/30/2017  . S/P cervical spinal fusion 09/28/2017  . Superficial venous thrombosis of right arm 01/05/2017  . Left shoulder pain 01/05/2017  . Depression 05/02/2016  . Ingrown right big toenail 02/16/2016  . Obesity 01/23/2015  . Right shoulder pain 01/23/2015  . Former smoker 08/28/2014  . Nephrolithiasis 07/25/2014  . Diabetes mellitus, type 2 (HCC) 07/25/2014  . Essential hypertension, benign 07/25/2014  . Annual physical exam 07/25/2014  . Hyperlipidemia 07/25/2014    Taylore Hinde, PT 04/24/2019, 1:19 PM  [facility] [address]  [address] [place], [place], [place] Phone: [phone]   Fax:  [phone]  Name: DYON ROTERT MRN: Robert Hines Date of Birth: 1955/07/29

## 2019-04-26 ENCOUNTER — Other Ambulatory Visit: Payer: Self-pay

## 2019-04-26 ENCOUNTER — Encounter: Payer: Self-pay | Admitting: Rehabilitative and Restorative Service Providers"

## 2019-04-26 ENCOUNTER — Ambulatory Visit (INDEPENDENT_AMBULATORY_CARE_PROVIDER_SITE_OTHER): Payer: 59 | Admitting: Rehabilitative and Restorative Service Providers"

## 2019-04-26 DIAGNOSIS — M25512 Pain in left shoulder: Secondary | ICD-10-CM | POA: Diagnosis not present

## 2019-04-26 DIAGNOSIS — M6281 Muscle weakness (generalized): Secondary | ICD-10-CM

## 2019-04-26 DIAGNOSIS — M25612 Stiffness of left shoulder, not elsewhere classified: Secondary | ICD-10-CM | POA: Diagnosis not present

## 2019-04-26 DIAGNOSIS — R293 Abnormal posture: Secondary | ICD-10-CM

## 2019-04-26 DIAGNOSIS — G8929 Other chronic pain: Secondary | ICD-10-CM

## 2019-04-26 DIAGNOSIS — R29898 Other symptoms and signs involving the musculoskeletal system: Secondary | ICD-10-CM

## 2019-04-26 NOTE — Therapy (Addendum)
[place] [facility] [address] [place], [place], [address] Phone: [phone]   Fax:  [phone]  Physical Therapy Treatment  Patient Details  Name: Robert Hines MRN: [id number] Date of Birth: 08-27-1955 Referring Provider (PT): Tania Ade, MD   Encounter Date: 04/26/2019  PT End of Session - 04/26/19 1059    Visit Number  10    Number of Visits  30    Date for PT Re-Evaluation  06/27/19    Activity Tolerance  Patient tolerated treatment well;No increased pain    Behavior During Therapy  WFL for tasks assessed/performed     Time in:  1015 Time Out:  11:00  Past Medical History:  Diagnosis Date  . Arthritis    neck  . Depression   . Diabetes mellitus without complication ([place])    type 2  . Hyperlipidemia   . Hypertension   . Pneumonia    covid pneumonia and covid july 2020, d/ced july 16th from [place] stayed 9 days  . Renal disorder    calculi, multiple lithotripsies, stents  . Right cataract   . Rotator cuff disorder   . Spinal pain     Past Surgical History:  Procedure Laterality Date  . ANTERIOR FUSION CERVICAL SPINE  2007  . ARTHOSCOPIC ROTAOR CUFF REPAIR Left 03/21/2019   Procedure: ARTHROSCOPIC ROTATOR CUFF REPAIR;  Surgeon: Tania Ade, MD;  Location: [facility];  Service: Orthopedics;  Laterality: Left;  . EYE SURGERY Left    cataract  . FOOT SURGERY Right    pin placed and removed  . HERNIA REPAIR     inguinal  . LITHOTRIPSY     several  . SHOULDER ARTHROSCOPY WITH ROTATOR CUFF REPAIR AND SUBACROMIAL DECOMPRESSION Right 12/28/2015   Procedure: SHOULDER ARTHROSCOPY TAKE DOWN ROTATOR CUFF REPAIR AND SUBACROMIAL DECOMPRESSION, DEBRIDEMENT OF SLAP TEAR;  Surgeon: Tania Ade, MD;  Location: [facility];  Service: Orthopedics;  Laterality: Right;  SHOULDER ARTHROSCOPY TAKE DOWN ROTATOR CUFF REPAIR AND SUBACROMIAL DECOMPRESSION  . SHOULDER ARTHROSCOPY  WITH SUBACROMIAL DECOMPRESSION Left 03/21/2019   Procedure: SHOULDER ARTHROSCOPY WITH SUBACROMIAL DECOMPRESSION;  Surgeon: Tania Ade, MD;  Location: [facility];  Service: Orthopedics;  Laterality: Left;  [name] VASECTOMY Bilateral     There were no vitals filed for this visit.  Subjective Assessment - 04/26/19 1017    Subjective  The patient reports he felt good leaving therapy Wednesday and got soreness that night with pain continued yesterday.  No pain at rest, but notes pain intermittently t/o the day.    Pertinent History  Rt RTC repair 2017    Patient Stated Goals  regain function/use of Lt shoulder    Currently in Pain?  Yes    Pain Score  --   None at rest; 3/10 yesterday described as "nagging" in L anterior and superior shoulder   Pain Location  Shoulder    Pain Orientation  Left    Pain Descriptors / Indicators  Aching;Nagging    Pain Type  Surgical pain;Acute pain    Pain Onset  More than a month ago    Pain Frequency  Intermittent    Aggravating Factors   movement    Pain Relieving Factors  pain meds, muscle relaxers, ice         [facility] PT Assessment - 04/26/19 1057      PROM   Right/Left Shoulder  Left    Left Shoulder Flexion  90 Degrees  Left Shoulder External Rotation  20 Degrees                   [facility] Adult PT Treatment/Exercise - 04/26/19 1020      Self-Care   Self-Care  Other Self-Care Comments    Other Self-Care Comments   Reviewed importance of using ice to manage soreness/decrease inflammation      Exercises   Exercises  Shoulder;Elbow;Wrist      Elbow Exercises   Elbow Flexion  AROM;Left    Elbow Extension  PROM;Left      Shoulder Exercises: Supine   External Rotation  PROM;Left;10 reps    External Rotation Limitations  improved PROM after supine shoulder blade squeeze    Flexion  PROM;Left;10 reps    Other Supine Exercises  shoulder dqueeze x 10 reps      Modalities   Modalities  Vasopneumatic       Vasopneumatic   Number Minutes Vasopneumatic   10 minutes    Vasopnuematic Location   Shoulder    Vasopneumatic Pressure  Low    Vasopneumatic Temperature   34 deg      Manual Therapy   Manual Therapy  Soft tissue mobilization    Manual therapy comments  Patient supine    Soft tissue mobilization  STM to left pectoralis, left bicipital groove               PT Short Term Goals - 04/26/19 1100      PT SHORT TERM GOAL #1   Title  independent with initial HEP    Status  Achieved    Target Date  05/02/19      PT SHORT TERM GOAL #2   Title  improve Lt shoulder PROM to limits of protocol (flexion 90 deg; er 20 deg) in preparation to move into next phase of protocol    Status  Achieved    Target Date  05/02/19      PT SHORT TERM GOAL #3   Title  report pain < 4/10 with PROM for improved pain and tolerance    Status  On-going    Target Date  05/02/19        PT Long Term Goals - 04/04/19 1219      PT LONG TERM GOAL #1   Title  independent with advanced HEP    Status  New    Target Date  06/27/19      PT LONG TERM GOAL #2   Title  FOTO score improved to </= 44% limited for improved function    Status  New    Target Date  06/27/19      PT LONG TERM GOAL #3   Title  demonstrate Lt shoulder strength at least 3/5 for improved function    Status  New    Target Date  05/16/19      PT LONG TERM GOAL #4   Title  Lt shoulder AROM improved to within 5 deg of Rt shoulder for flexion, abduction and external rotation for improved function    Status  New    Target Date  06/27/19      PT LONG TERM GOAL #5   Title  report pain < 2/10 for improved function and mobility    Status  New    Target Date  06/27/19            Plan - 04/26/19 1100    Clinical Impression Statement  The patient noted soreness after Wednesday's  session with PROM.  He did not ice at home and PT recommended ice for pain control and to reduce soreness.  Patient has met STG for HEP performance and  for initial ROM.  PT to continue working to other STGs within parameters of protocol.    PT Treatment/Interventions  ADLs/Self Care Home Management;Cryotherapy;Electrical Stimulation;Ultrasound;Moist Heat;Iontophoresis 64m/ml Dexamethasone;Therapeutic activities;Therapeutic exercise;Patient/family education;Manual techniques;Passive range of motion;Scar mobilization;Vasopneumatic Device;Taping    PT Next Visit Plan  Continue progressing shoulder per protocol    PT Home Exercise Plan  Access Code: [email or web address]   Consulted and Agree with Plan of Care  Patient       Patient will benefit from skilled therapeutic intervention in order to improve the following deficits and impairments:  Decreased range of motion, Increased fascial restricitons, Increased muscle spasms, Pain, Postural dysfunction, Decreased strength  Visit Diagnosis: Acute pain of left shoulder  Stiffness of left shoulder, not elsewhere classified  Chronic left shoulder pain  Muscle weakness (generalized)  Abnormal posture  Other symptoms and signs involving the musculoskeletal system     Problem List Patient Active Problem List   Diagnosis Date Noted  . Thrombocytopenia ([place] 02/03/2019  . Pneumonia due to COVID-19 virus 12/20/2018  . Mass of left thigh 05/24/2018  . History of DVT (deep vein thrombosis) 05/09/2018  . Pain and swelling of left lower extremity 05/09/2018  . Cold sore 04/26/2018  . Primary insomnia 10/30/2017  . S/P cervical spinal fusion 09/28/2017  . Superficial venous thrombosis of right arm 01/05/2017  . Left shoulder pain 01/05/2017  . Depression 05/02/2016  . Ingrown right big toenail 02/16/2016  . Obesity 01/23/2015  . Right shoulder pain 01/23/2015  . Former smoker 08/28/2014  . Nephrolithiasis 07/25/2014  . Diabetes mellitus, type 2 ([place] 07/25/2014  . Essential hypertension, benign 07/25/2014  . Annual physical exam 07/25/2014  . Hyperlipidemia 07/25/2014    [place]  PT 04/26/2019, 11:05 AM  [facility] [address]Phone: [phone]  Fax:  [phone] Name: Robert COLGANMRN: [id number] of Birth: 9Sep 08, 1957

## 2019-04-29 ENCOUNTER — Other Ambulatory Visit: Payer: Self-pay

## 2019-04-29 ENCOUNTER — Encounter: Payer: 59 | Admitting: Physical Therapy

## 2019-04-29 ENCOUNTER — Ambulatory Visit (INDEPENDENT_AMBULATORY_CARE_PROVIDER_SITE_OTHER): Payer: 59 | Admitting: Rehabilitative and Restorative Service Providers"

## 2019-04-29 ENCOUNTER — Encounter: Payer: Self-pay | Admitting: Rehabilitative and Restorative Service Providers"

## 2019-04-29 DIAGNOSIS — R293 Abnormal posture: Secondary | ICD-10-CM | POA: Diagnosis not present

## 2019-04-29 DIAGNOSIS — M25512 Pain in left shoulder: Secondary | ICD-10-CM | POA: Diagnosis not present

## 2019-04-29 DIAGNOSIS — M6281 Muscle weakness (generalized): Secondary | ICD-10-CM | POA: Diagnosis not present

## 2019-04-29 DIAGNOSIS — M25612 Stiffness of left shoulder, not elsewhere classified: Secondary | ICD-10-CM

## 2019-04-29 DIAGNOSIS — R29898 Other symptoms and signs involving the musculoskeletal system: Secondary | ICD-10-CM

## 2019-04-29 DIAGNOSIS — G8929 Other chronic pain: Secondary | ICD-10-CM

## 2019-04-29 LAB — HM DIABETES EYE EXAM

## 2019-04-29 NOTE — Therapy (Signed)
[facility], [place], [address] Phone: [phone]   Fax:  [phone]  Physical Therapy Treatment  Patient Details  Name: Robert Hines MRN: [id number] Date of Birth: 1956-05-27 Referring Provider (PT): Tania Ade, MD   Encounter Date: 04/29/2019  PT End of Session - 04/29/19 1229    Visit Number  11    Number of Visits  30    Date for PT Re-Evaluation  06/27/19    PT Start Time  5284    PT Stop Time  1230    PT Time Calculation (min)  43 min    Activity Tolerance  Patient tolerated treatment well;No increased pain    Behavior During Therapy  WFL for tasks assessed/performed       Past Medical History:  Diagnosis Date  . Arthritis    neck  . Depression   . Diabetes mellitus without complication ([place])    type 2  . Hyperlipidemia   . Hypertension   . Pneumonia    covid pneumonia and covid july 2020, d/ced july 16th from [place] stayed 9 days  . Renal disorder    calculi, multiple lithotripsies, stents  . Right cataract   . Rotator cuff disorder   . Spinal pain     Past Surgical History:  Procedure Laterality Date  . ANTERIOR FUSION CERVICAL SPINE  2007  . ARTHOSCOPIC ROTAOR CUFF REPAIR Left 03/21/2019   Procedure: ARTHROSCOPIC ROTATOR CUFF REPAIR;  Surgeon: Tania Ade, MD;  Location: [facility];  Service: Orthopedics;  Laterality: Left;  . EYE SURGERY Left    cataract  . FOOT SURGERY Right    pin placed and removed  . HERNIA REPAIR     inguinal  . LITHOTRIPSY     several  . SHOULDER ARTHROSCOPY WITH ROTATOR CUFF REPAIR AND SUBACROMIAL DECOMPRESSION Right 12/28/2015   Procedure: SHOULDER ARTHROSCOPY TAKE DOWN ROTATOR CUFF REPAIR AND SUBACROMIAL DECOMPRESSION, DEBRIDEMENT OF SLAP TEAR;  Surgeon: Tania Ade, MD;  Location: [place];  Service: Orthopedics;  Laterality: Right;  SHOULDER ARTHROSCOPY TAKE DOWN ROTATOR CUFF REPAIR  AND SUBACROMIAL DECOMPRESSION  . SHOULDER ARTHROSCOPY WITH SUBACROMIAL DECOMPRESSION Left 03/21/2019   Procedure: SHOULDER ARTHROSCOPY WITH SUBACROMIAL DECOMPRESSION;  Surgeon: Tania Ade, MD;  Location: [facility];  Service: Orthopedics;  Laterality: Left;  [name] VASECTOMY Bilateral     There were no vitals filed for this visit.  Subjective Assessment - 04/29/19 1148    Subjective  The patient arrives today with sunglasses donned due to eye doctor visit this morning.  He has no pain at rest, but notices some anterior pain when rolling in bed or moving (not moving his arm, but activity in general).    Pertinent History  Rt RTC repair 2017    Patient Stated Goals  regain function/use of Lt shoulder    Currently in Pain?  No/denies    Pain Score  4     Pain Location  Shoulder    Pain Orientation  Left    Pain Descriptors / Indicators  Aching;Nagging    Pain Type  Surgical pain    Pain Onset  More than a month ago    Pain Frequency  Intermittent    Aggravating Factors   movement (not AROM, but general activity/ rolling in bed)    Pain Relieving Factors  pain meds, muscle relaxers, ice         [facility] PT Assessment - 04/29/19 1229  ROM / Strength   AROM / PROM / Strength  PROM      PROM   Overall PROM   Deficits    PROM Assessment Site  Shoulder    Right/Left Shoulder  Left    Left Shoulder Flexion  92 Degrees    Left Shoulder ABduction  --   positioned at 30 deg with distal humerus on towel roll ER   Left Shoulder Internal Rotation  --   to abdomen    Left Shoulder External Rotation  20 Degrees   arm positioned in 25 degrees abduction for ER PROM                  [facility] Adult PT Treatment/Exercise - 04/29/19 1152      Self-Care   Self-Care  Other Self-Care Comments    Other Self-Care Comments   Addressed patient concerns of soreness in anterior shoulder.  At rest, he notes no pain, but does report anterior shoulder pain with "movement".  We  reviewed that at this point, he should not be moving his arm.  He reports more with general activity and rolling in bed, he gets some anterior shoulder discomfort.      Exercises   Exercises  Shoulder;Neck      Elbow Exercises   Elbow Extension  AROM;Left;10 reps      Neck Exercises: Supine   Neck Retraction  10 reps;3 secs      Shoulder Exercises: Supine   External Rotation  PROM;Left;10 reps    Flexion  PROM;Left;10 reps    Other Supine Exercises  shoulder squeeze x 10 reps scapular retraction x 3 second holds (shoulder begins to "bite"-- notes anterior shoulder discomfort).      Shoulder Exercises: Sidelying   Other Sidelying Exercises  scapular retraction with PT supporting weight of arm x 10 reps      Modalities   Modalities  Vasopneumatic      Vasopneumatic   Number Minutes Vasopneumatic   10 minutes    Vasopnuematic Location   Shoulder    Vasopneumatic Pressure  Low    Vasopneumatic Temperature   34 deg      Manual Therapy   Manual Therapy  Soft tissue mobilization;Myofascial release;Passive ROM    Manual therapy comments  In supine to reduce muscle tightness/ improve scar tissue mobility    Soft tissue mobilization  superficial STM to biceps and pectoralis    Myofascial Release  L biceps    Passive ROM  *see ther ex above.  Patient does have muscle spasms noted at 90 deg PROM flexion.               PT Short Term Goals - 04/29/19 1243      PT SHORT TERM GOAL #1   Title  independent with initial HEP    Status  Achieved    Target Date  05/02/19      PT SHORT TERM GOAL #2   Title  improve Lt shoulder PROM to limits of protocol (flexion 90 deg; er 20 deg) in preparation to move into next phase of protocol    Status  Achieved    Target Date  05/02/19      PT SHORT TERM GOAL #3   Title  report pain < 4/10 with PROM for improved pain and tolerance    Status  Achieved    Target Date  05/02/19        PT Long Term Goals - 04/04/19 1219  PT LONG TERM  GOAL #1   Title  independent with advanced HEP    Status  New    Target Date  06/27/19      PT LONG TERM GOAL #2   Title  FOTO score improved to </= 44% limited for improved function    Status  New    Target Date  06/27/19      PT LONG TERM GOAL #3   Title  demonstrate Lt shoulder strength at least 3/5 for improved function    Status  New    Target Date  05/16/19      PT LONG TERM GOAL #4   Title  Lt shoulder AROM improved to within 5 deg of Rt shoulder for flexion, abduction and external rotation for improved function    Status  New    Target Date  06/27/19      PT LONG TERM GOAL #5   Title  report pain < 2/10 for improved function and mobility    Status  New    Target Date  06/27/19            Plan - 04/29/19 1242    Clinical Impression Statement  The patient has met 3/3 STGs at this time.  He notes increased use of ice at home with less soreness after PT.  PT reviewed no active movement at this time.  Patient to f/u with MD on Wednesday for post-op visit.  Plan to continue progressing within parameters of protocol    PT Treatment/Interventions  ADLs/Self Care Home Management;Cryotherapy;Electrical Stimulation;Ultrasound;Moist Heat;Iontophoresis 51m/ml Dexamethasone;Therapeutic activities;Therapeutic exercise;Patient/family education;Manual techniques;Passive range of motion;Scar mobilization;Vasopneumatic Device;Taping    PT Next Visit Plan  Continue progressing shoulder per protocol    PT Home Exercise Plan  Access Code: [email or web address]   Consulted and Agree with Plan of Care  Patient       Patient will benefit from skilled therapeutic intervention in order to improve the following deficits and impairments:  Decreased range of motion, Increased fascial restricitons, Increased muscle spasms, Pain, Postural dysfunction, Decreased strength  Visit Diagnosis: Acute pain of left shoulder  Stiffness of left shoulder, not elsewhere classified  Chronic left shoulder  pain  Muscle weakness (generalized)  Abnormal posture  Other symptoms and signs involving the musculoskeletal system     Problem List Patient Active Problem List   Diagnosis Date Noted  . Thrombocytopenia ([place] 02/03/2019  . Pneumonia due to COVID-19 virus 12/20/2018  . Mass of left thigh 05/24/2018  . History of DVT (deep vein thrombosis) 05/09/2018  . Pain and swelling of left lower extremity 05/09/2018  . Cold sore 04/26/2018  . Primary insomnia 10/30/2017  . S/P cervical spinal fusion 09/28/2017  . Superficial venous thrombosis of right arm 01/05/2017  . Left shoulder pain 01/05/2017  . Depression 05/02/2016  . Ingrown right big toenail 02/16/2016  . Obesity 01/23/2015  . Right shoulder pain 01/23/2015  . Former smoker 08/28/2014  . Nephrolithiasis 07/25/2014  . Diabetes mellitus, type 2 ([place] 07/25/2014  . Essential hypertension, benign 07/25/2014  . Annual physical exam 07/25/2014  . Hyperlipidemia 07/25/2014    Shelbe Haglund, PT 04/29/2019, 12:44 PM  [facility] [address]Phone: [phone]  Fax:  [phone] Name: Robert FURYMRN: [id number] of Birth: 927-Mar-1957

## 2019-05-01 ENCOUNTER — Ambulatory Visit (INDEPENDENT_AMBULATORY_CARE_PROVIDER_SITE_OTHER): Payer: 59 | Admitting: Rehabilitative and Restorative Service Providers"

## 2019-05-01 ENCOUNTER — Encounter: Payer: Self-pay | Admitting: Rehabilitative and Restorative Service Providers"

## 2019-05-01 ENCOUNTER — Encounter: Payer: 59 | Admitting: Rehabilitative and Restorative Service Providers"

## 2019-05-01 ENCOUNTER — Other Ambulatory Visit: Payer: Self-pay

## 2019-05-01 DIAGNOSIS — M6281 Muscle weakness (generalized): Secondary | ICD-10-CM

## 2019-05-01 DIAGNOSIS — R293 Abnormal posture: Secondary | ICD-10-CM

## 2019-05-01 DIAGNOSIS — M25612 Stiffness of left shoulder, not elsewhere classified: Secondary | ICD-10-CM

## 2019-05-01 DIAGNOSIS — M25512 Pain in left shoulder: Secondary | ICD-10-CM | POA: Diagnosis not present

## 2019-05-01 DIAGNOSIS — G8929 Other chronic pain: Secondary | ICD-10-CM

## 2019-05-01 DIAGNOSIS — R29898 Other symptoms and signs involving the musculoskeletal system: Secondary | ICD-10-CM

## 2019-05-01 NOTE — Therapy (Signed)
[facility] [place] [address] [place], [place], [address] Phone: [phone]   Fax:  [phone]  Physical Therapy Treatment  Patient Details  Name: Robert Hines MRN: [id number] Date of Birth: [birth year] Referring Provider (PT): Jones Broom, MD   Encounter Date: 05/01/2019  PT End of Session - 05/01/19 1544    Visit Number  12    Number of Visits  30    Date for PT Re-Evaluation  06/27/19    PT Start Time  0848    PT Stop Time  0938    PT Time Calculation (min)  50 min       Past Medical History:  Diagnosis Date  . Arthritis    neck  . Depression   . Diabetes mellitus without complication (HCC)    type 2  . Hyperlipidemia   . Hypertension   . Pneumonia    covid pneumonia and covid july 2020, d/ced july 16th from [facility] stayed [facility] days  . Renal disorder    calculi, multiple lithotripsies, stents  . Right cataract   . Rotator cuff disorder   . Spinal pain     Past Surgical History:  Procedure Laterality Date  . ANTERIOR FUSION CERVICAL SPINE  2007  . ARTHOSCOPIC ROTAOR CUFF REPAIR Left 03/21/2019   Procedure: ARTHROSCOPIC ROTATOR CUFF REPAIR;  Surgeon: Jones Broom, MD;  Location: [facility];  Service: Orthopedics;  Laterality: Left;  . EYE SURGERY Left    cataract  . FOOT SURGERY Right    pin placed and removed  . HERNIA REPAIR     inguinal  . LITHOTRIPSY     several  . SHOULDER ARTHROSCOPY WITH ROTATOR CUFF REPAIR AND SUBACROMIAL DECOMPRESSION Right 12/28/2015   Procedure: SHOULDER ARTHROSCOPY TAKE DOWN ROTATOR CUFF REPAIR AND SUBACROMIAL DECOMPRESSION, DEBRIDEMENT OF SLAP TEAR;  Surgeon: Jones Broom, MD;  Location:  [facility];  Service: Orthopedics;  Laterality: Right;  SHOULDER ARTHROSCOPY TAKE DOWN ROTATOR CUFF REPAIR AND SUBACROMIAL DECOMPRESSION  . SHOULDER ARTHROSCOPY WITH SUBACROMIAL DECOMPRESSION Left 03/21/2019   Procedure: SHOULDER ARTHROSCOPY  WITH SUBACROMIAL DECOMPRESSION;  Surgeon: Jones Broom, MD;  Location[facility];  Service: Orthopedics;  Laterality: Left;  [name] VASECTOMY Bilateral     There were no vitals filed for this visit.  Subjective Assessment - 05/01/19 0853    Subjective  The patient is not sore this morning.  He has MD visit this morning.    Patient Stated Goals  regain function/use of Lt shoulder    Currently in Pain?  No/denies                       [facility] Adult PT Treatment/Exercise - 05/01/19 0925      Exercises   Exercises  Shoulder;Neck      Neck Exercises: Seated   Shoulder Shrugs  10 reps    Other Seated Exercise  scapular retraction      Shoulder Exercises: Supine   External Rotation  PROM;Left;10 reps    External Rotation Limitations  ROM to 22 degrees    Flexion  PROM;Left;10 reps    Flexion Limitations  INITIATED AAROM with cane lifting to ceiling and return and then AAROM into flexion and added to HEP    Other Supine Exercises  Elbow flexion with cane supine x 10 reps      Shoulder Exercises: Seated   Other Seated Exercises  shoulder shrugs, scapular retraction x 10 reps  Shoulder Exercises: Sidelying   Other Sidelying Exercises  scapular retraction with PT supporting weight of shoulder, gentle scapular AROM in supine.      Modalities   Modalities  Vasopneumatic      Vasopneumatic   Number Minutes Vasopneumatic   10 minutes    Vasopnuematic Location   Shoulder    Vasopneumatic Pressure  Low    Vasopneumatic Temperature   34      Manual Therapy   Manual Therapy  Soft tissue mobilization;Myofascial release;Passive ROM    Manual therapy comments  In supine to reduce muscle tightness/ improve scar tissue mobility    Soft tissue mobilization  superfiricial STM L anterior pec, biceps, deltoid    Passive ROM  *see ther ex             PT Education - 05/01/19 1544    Education Details  Added AAROM cane supine shoulder flexion to current  HEP    Person(s) Educated  Patient    Methods  Explanation;Demonstration;Handout    Comprehension  Verbalized understanding;Returned demonstration       PT Short Term Goals - 04/29/19 1243      PT SHORT TERM GOAL #1   Title  independent with initial HEP    Status  Achieved    Target Date  05/02/19      PT SHORT TERM GOAL #2   Title  improve Lt shoulder PROM to limits of protocol (flexion 90 deg; er 20 deg) in preparation to move into next phase of protocol    Status  Achieved    Target Date  05/02/19      PT SHORT TERM GOAL #3   Title  report pain < 4/10 with PROM for improved pain and tolerance    Status  Achieved    Target Date  05/02/19        PT Long Term Goals - 04/04/19 1219      PT LONG TERM GOAL #1   Title  independent with advanced HEP    Status  New    Target Date  06/27/19      PT LONG TERM GOAL #2   Title  FOTO score improved to </= 44% limited for improved function    Status  New    Target Date  06/27/19      PT LONG TERM GOAL #3   Title  demonstrate Lt shoulder strength at least 3/5 for improved function    Status  New    Target Date  05/16/19      PT LONG TERM GOAL #4   Title  Lt shoulder AROM improved to within 5 deg of Rt shoulder for flexion, abduction and external rotation for improved function    Status  New    Target Date  06/27/19      PT LONG TERM GOAL #5   Title  report pain < 2/10 for improved function and mobility    Status  New    Target Date  06/27/19            Plan - 05/01/19 1556    Clinical Impression Statement  The patient has MD appt today.  Per protocol, he is progressing to AAROM supine shoulder flexion and PT added today.  Plan to continue per protocol working on regaining PROM, and will begin isometrics next week as indicated (unless any change in plan from MD visit).    Comorbidities  bil RTC repairs; HTN, COVID+ 12/2018, depression, DM, OA  PT Treatment/Interventions  ADLs/Self Care Home  Management;Cryotherapy;Electrical Stimulation;Ultrasound;Moist Heat;Iontophoresis 4mg /ml Dexamethasone;Therapeutic activities;Therapeutic exercise;Patient/family education;Manual techniques;Passive range of motion;Scar mobilization;Vasopneumatic Device;Taping    PT Next Visit Plan  Continue progressing shoulder per protocol    PT Home Exercise Plan  Access Code: [email or web address]    Consulted and Agree with Plan of Care  Patient       Patient will benefit from skilled therapeutic intervention in order to improve the following deficits and impairments:  Decreased range of motion, Increased fascial restricitons, Increased muscle spasms, Pain, Postural dysfunction, Decreased strength  Visit Diagnosis: Acute pain of left shoulder  Stiffness of left shoulder, not elsewhere classified  Chronic left shoulder pain  Muscle weakness (generalized)  Abnormal posture  Other symptoms and signs involving the musculoskeletal system     Problem List Patient Active Problem List   Diagnosis Date Noted  . Thrombocytopenia (HCC) 02/03/2019  . Pneumonia due to COVID-19 virus 12/20/2018  . Mass of left thigh 05/24/2018  . History of DVT (deep vein thrombosis) 05/09/2018  . Pain and swelling of left lower extremity 05/09/2018  . Cold sore 04/26/2018  . Primary insomnia 10/30/2017  . S/P cervical spinal fusion 09/28/2017  . Superficial venous thrombosis of right arm 01/05/2017  . Left shoulder pain 01/05/2017  . Depression 05/02/2016  . Ingrown right big toenail 02/16/2016  . Obesity 01/23/2015  . Right shoulder pain 01/23/2015  . Former smoker 08/28/2014  . Nephrolithiasis 07/25/2014  . Diabetes mellitus, type 2 (HCC) 07/25/2014  . Essential hypertension, benign 07/25/2014  . Annual physical exam 07/25/2014  . Hyperlipidemia 07/25/2014    Devinne Epstein , PT 05/01/2019, 3:57 PM  [facility] [address] [place], [place],  [address] Phone: [phone]   Fax:  [phone]  Name: Belva BertinGary E Omara MRN: [id number] Date of Birth: [birth year]

## 2019-05-03 ENCOUNTER — Ambulatory Visit (INDEPENDENT_AMBULATORY_CARE_PROVIDER_SITE_OTHER): Payer: 59 | Admitting: Rehabilitative and Restorative Service Providers"

## 2019-05-03 ENCOUNTER — Other Ambulatory Visit: Payer: Self-pay

## 2019-05-03 DIAGNOSIS — M25612 Stiffness of left shoulder, not elsewhere classified: Secondary | ICD-10-CM | POA: Diagnosis not present

## 2019-05-03 DIAGNOSIS — M6281 Muscle weakness (generalized): Secondary | ICD-10-CM | POA: Diagnosis not present

## 2019-05-03 DIAGNOSIS — M25512 Pain in left shoulder: Secondary | ICD-10-CM | POA: Diagnosis not present

## 2019-05-03 DIAGNOSIS — R293 Abnormal posture: Secondary | ICD-10-CM | POA: Diagnosis not present

## 2019-05-03 DIAGNOSIS — G8929 Other chronic pain: Secondary | ICD-10-CM

## 2019-05-03 DIAGNOSIS — R29898 Other symptoms and signs involving the musculoskeletal system: Secondary | ICD-10-CM

## 2019-05-03 NOTE — Therapy (Signed)
[facility] [place] [address] [place], [place], [address] Phone: [phone]   Fax:  [phone]  Physical Therapy Treatment  Patient Details  Name: Robert Hines MRN: [id number] Date of Birth: 09-07-1955 Referring Provider (PT): Jones Broom, MD   Encounter Date: 05/03/2019  PT End of Session - 05/03/19 1059    Visit Number  13    Number of Visits  30    Date for PT Re-Evaluation  06/27/19    PT Start Time  1015    PT Stop Time  1105    PT Time Calculation (min)  50 min       Past Medical History:  Diagnosis Date  . Arthritis    neck  . Depression   . Diabetes mellitus without complication (HCC)    type 2  . Hyperlipidemia   . Hypertension   . Pneumonia    covid pneumonia and covid july 2020, d[facility]ced [facility] 16th [facility] stayed [facility] days  . Renal disorder    calculi, multiple lithotripsies, stents  . Right cataract   . Rotator cuff disorder   . Spinal pain     Past Surgical History:  Procedure Laterality Date  . ANTERIOR FUSION CERVICAL SPINE  2007  . ARTHOSCOPIC ROTAOR CUFF REPAIR Left 03/21/2019   Procedure: ARTHROSCOPIC ROTATOR CUFF REPAIR;  Surgeon: Jones Broom, MD;  Location: [facility];  Service: Orthopedics;  Laterality: Left;  . EYE SURGERY Left    cataract  . FOOT SURGERY Right    pin placed and removed  . HERNIA REPAIR     inguinal  . LITHOTRIPSY     several  . SHOULDER ARTHROSCOPY WITH ROTATOR CUFF REPAIR AND SUBACROMIAL DECOMPRESSION Right 12/28/2015   Procedure: SHOULDER ARTHROSCOPY TAKE DOWN ROTATOR CUFF REPAIR AND SUBACROMIAL DECOMPRESSION, DEBRIDEMENT OF SLAP TEAR;  Surgeon: Jones Broom, MD;  Location: [facility];  Service: Orthopedics;  Laterality: Right;  SHOULDER ARTHROSCOPY TAKE DOWN ROTATOR CUFF REPAIR AND SUBACROMIAL DECOMPRESSION  . SHOULDER ARTHROSCOPY WITH SUBACROMIAL DECOMPRESSION Left 03/21/2019   Procedure: SHOULDER ARTHROSCOPY  WITH SUBACROMIAL DECOMPRESSION;  Surgeon: Jones Broom, MD;  [facility];  Service: Orthopedics;  Laterality: Left;  [name] VASECTOMY Bilateral     There were no vitals filed for this visit.  Subjective Assessment - 05/03/19 1254    Subjective  The patient is sore after adding AAROM supine flexion to HEP.    Pertinent History  Rt RTC repair 2017    Patient Stated Goals  regain function/use of Lt shoulder    Currently in Pain?  Yes    Pain Score  --   "sore" none at rest, but soreness at night   Pain Location  Shoulder    Pain Orientation  Left    Pain Descriptors / Indicators  Aching;Sore    Pain Type  Surgical pain    Pain Onset  More than a month ago    Pain Frequency  Intermittent    Aggravating Factors   movement    Pain Relieving Factors  pain meds, muscle relaxers, ice                       [facility] Adult PT Treatment/Exercise - 05/03/19 1100      Exercises   Exercises  Shoulder      Shoulder Exercises: Supine   External Rotation  AAROM;PROM;Left;10 reps    External Rotation Limitations  ROM to 25 degrees  Flexion  PROM;AAROM;Left;10 reps      Shoulder Exercises: Standing   Protraction  AROM;Strengthening;Both;10 reps    Internal Rotation  AAROM;Left    Internal Rotation Limitations  moving cane up hips bilateral UEs      Shoulder Exercises: Isometric Strengthening   Flexion  --   3 sec x 10 reps   Extension  --   3 seconds x 10 reps   External Rotation  --   3 seconds x 10 reps     Modalities   Modalities  Vasopneumatic      Vasopneumatic   Number Minutes Vasopneumatic   10 minutes    Vasopnuematic Location   Shoulder    Vasopneumatic Pressure  Low    Vasopneumatic Temperature   34      Manual Therapy   Manual Therapy  Soft tissue mobilization;Passive ROM    Soft tissue mobilization  STM for L bicipital groove, anterior shoulder and middle deltoid               PT Short Term Goals - 04/29/19 1243       PT SHORT TERM GOAL #1   Title  independent with initial HEP    Status  Achieved    Target Date  05/02/19      PT SHORT TERM GOAL #2   Title  improve Lt shoulder PROM to limits of protocol (flexion 90 deg; er 20 deg) in preparation to move into next phase of protocol    Status  Achieved    Target Date  05/02/19      PT SHORT TERM GOAL #3   Title  report pain < 4/10 with PROM for improved pain and tolerance    Status  Achieved    Target Date  05/02/19        PT Long Term Goals - 04/04/19 1219      PT LONG TERM GOAL #1   Title  independent with advanced HEP    Status  New    Target Date  06/27/19      PT LONG TERM GOAL #2   Title  FOTO score improved to </= 44% limited for improved function    Status  New    Target Date  06/27/19      PT LONG TERM GOAL #3   Title  demonstrate Lt shoulder strength at least 3/5 for improved function    Status  New    Target Date  05/16/19      PT LONG TERM GOAL #4   Title  Lt shoulder AROM improved to within 5 deg of Rt shoulder for flexion, abduction and external rotation for improved function    Status  New    Target Date  06/27/19      PT LONG TERM GOAL #5   Title  report pain < 2/10 for improved function and mobility    Status  New    Target Date  06/27/19            Plan - 05/03/19 1301    Clinical Impression Statement  The patient was progressed per protocol to allow AAROM and begin moving further through PROM. Isometrics also initiated today.  PT to continue progressing to patient tolerance per protocol.  He is scheduled to have upcoming eye procedure, which may limit frequency next week.    PT Treatment/Interventions  ADLs/Self Care Home Management;Cryotherapy;Electrical Stimulation;Ultrasound;Moist Heat;Iontophoresis 4mg /ml Dexamethasone;Therapeutic activities;Therapeutic exercise;Patient/family education;Manual techniques;Passive range of motion;Scar mobilization;Vasopneumatic Device;Taping  PT Next Visit Plan  Continue  progressing shoulder per protocol    PT Home Exercise Plan  Access Code: [email or web address]    Consulted and Agree with Plan of Care  Patient       Patient will benefit from skilled therapeutic intervention in order to improve the following deficits and impairments:  Decreased range of motion, Increased fascial restricitons, Increased muscle spasms, Pain, Postural dysfunction, Decreased strength  Visit Diagnosis: Stiffness of left shoulder, not elsewhere classified  Acute pain of left shoulder  Chronic left shoulder pain  Muscle weakness (generalized)  Abnormal posture  Other symptoms and signs involving the musculoskeletal system     Problem List Patient Active Problem List   Diagnosis Date Noted  . Thrombocytopenia (HCC) 02/03/2019  . Pneumonia due to COVID-19 virus 12/20/2018  . Mass of left thigh 05/24/2018  . History of DVT (deep vein thrombosis) 05/09/2018  . Pain and swelling of left lower extremity 05/09/2018  . Cold sore 04/26/2018  . Primary insomnia 10/30/2017  . S/P cervical spinal fusion 09/28/2017  . Superficial venous thrombosis of right arm 01/05/2017  . Left shoulder pain 01/05/2017  . Depression 05/02/2016  . Ingrown right big toenail 02/16/2016  . Obesity 01/23/2015  . Right shoulder pain 01/23/2015  . Former smoker 08/28/2014  . Nephrolithiasis 07/25/2014  . Diabetes mellitus, type 2 (HCC) 07/25/2014  . Essential hypertension, benign 07/25/2014  . Annual physical exam 07/25/2014  . Hyperlipidemia 07/25/2014    Jamirah Zelaya, PT 05/03/2019, 1:02 PM  [facility] [address] [place], [place], [address] Phone: [phone]   Fax:  [phone]  Name: Robert BertinGary E Dapolito MRN: [id number] Date of Birth: [birth year]

## 2019-05-03 NOTE — Patient Instructions (Signed)
Access Code: [email or web address]  URL: [email or web address]  Date: 05/03/2019  Prepared by: Rudell Cobb   Exercises Circular Shoulder Pendulum with Table Support - 10 reps - 1 sets - 3x daily - 7x weekly Flexion-Extension Shoulder Pendulum with Table Support - 10 reps - 1 sets - 3x daily - 7x weekly Horizontal Shoulder Pendulum with Table Support - 10 reps - 1 sets - 3x daily - 7x weekly Supine Shoulder External Rotation with Dowel - 10 reps - 1 sets - 3-5 seconds hold - 2x daily - 7x weekly Supine Shoulder Flexion with Dowel - 10 reps - 1 sets - 2x daily - 7x weekly Standing Bilateral Shoulder Internal Rotation AAROM with Dowel - 10 reps - 3 sets - 1x daily - 7x weekly Isometric Shoulder Flexion at Wall - 10 reps - 1 sets - 5 seconds hold - 2x daily - 7x weekly Isometric Shoulder Extension at Wall - 10 reps - 1 sets - 5 seconds hold - 2x daily - 7x weekly Standing Isometric Shoulder External Rotation with Doorway - 10 reps - 1 sets - 5 seconds hold - 2x daily - 7x weekly Isometric Shoulder Extension at Wall - 10 reps - 1 sets - 5 seconds hold - 2x daily - 7x weekly

## 2019-05-06 ENCOUNTER — Encounter: Payer: 59 | Admitting: Physical Therapy

## 2019-05-08 ENCOUNTER — Encounter: Payer: 59 | Admitting: Rehabilitative and Restorative Service Providers"

## 2019-05-08 LAB — HM DIABETES EYE EXAM

## 2019-05-09 ENCOUNTER — Other Ambulatory Visit: Payer: Self-pay

## 2019-05-09 ENCOUNTER — Ambulatory Visit (INDEPENDENT_AMBULATORY_CARE_PROVIDER_SITE_OTHER): Payer: 59 | Admitting: Physical Therapy

## 2019-05-09 ENCOUNTER — Encounter: Payer: Self-pay | Admitting: Physical Therapy

## 2019-05-09 DIAGNOSIS — M6281 Muscle weakness (generalized): Secondary | ICD-10-CM | POA: Diagnosis not present

## 2019-05-09 DIAGNOSIS — R293 Abnormal posture: Secondary | ICD-10-CM

## 2019-05-09 DIAGNOSIS — M25612 Stiffness of left shoulder, not elsewhere classified: Secondary | ICD-10-CM | POA: Diagnosis not present

## 2019-05-09 DIAGNOSIS — M25512 Pain in left shoulder: Secondary | ICD-10-CM | POA: Diagnosis not present

## 2019-05-09 DIAGNOSIS — G8929 Other chronic pain: Secondary | ICD-10-CM

## 2019-05-09 NOTE — Therapy (Signed)
[facility] [address] [place], [place], [address] Phone: [phone]   Fax:  [phone]  Physical Therapy Treatment  Patient Details  Name: Robert Hines MRN: [id number] Date of Birth: [birth year] Referring Provider (PT): Jones BroomJustin Chandler, MD   Encounter Date: 05/09/2019  PT End of Session - 05/09/19 1255    Visit Number  14    Number of Visits  30    Date for PT Re-Evaluation  06/27/19    PT Start Time  1015    PT Stop Time  1110    PT Time Calculation (min)  55 min    Activity Tolerance  Patient tolerated treatment well;No increased pain    Behavior During Therapy  WFL for tasks assessed/performed       Past Medical History:  Diagnosis Date  . Arthritis    neck  . Depression   . Diabetes mellitus without complication (HCC)    type 2  . Hyperlipidemia   . Hypertension   . Pneumonia    covid pneumonia and covid july 2020, [facility]  . Renal disorder    calculi, multiple lithotripsies, stents  . Right cataract   . Rotator cuff disorder   . Spinal pain     Past Surgical History:  Procedure Laterality Date  . ANTERIOR FUSION CERVICAL SPINE  2007  . ARTHOSCOPIC ROTAOR CUFF REPAIR Left 03/21/2019   Procedure: ARTHROSCOPIC ROTATOR CUFF REPAIR;  Surgeon: Jones Broomhandler, Justin, MD;  [facility];  Service: Orthopedics;  Laterality: Left;  . EYE SURGERY Left    cataract  . FOOT SURGERY Right    pin placed and removed  . HERNIA REPAIR     inguinal  . LITHOTRIPSY     several  . SHOULDER ARTHROSCOPY WITH ROTATOR CUFF REPAIR AND SUBACROMIAL DECOMPRESSION Right 12/28/2015   Procedure: SHOULDER ARTHROSCOPY TAKE DOWN ROTATOR CUFF REPAIR AND SUBACROMIAL DECOMPRESSION, DEBRIDEMENT OF SLAP TEAR;  Surgeon: Jones BroomJustin Chandler, MD;  Location: [facility];  Service: Orthopedics;  Laterality: Right;  SHOULDER ARTHROSCOPY TAKE DOWN ROTATOR CUFF REPAIR  AND SUBACROMIAL DECOMPRESSION  . SHOULDER ARTHROSCOPY WITH SUBACROMIAL DECOMPRESSION Left 03/21/2019   Procedure: SHOULDER ARTHROSCOPY WITH SUBACROMIAL DECOMPRESSION;  Surgeon: Jones Broomhandler, Justin, MD;  [facility];  Service: Orthopedics;  Laterality: Left;  [name]. VASECTOMY Bilateral     There were no vitals filed for this visit.  Subjective Assessment - 05/09/19 1254    Subjective  Pt reporting feeling sore after doing his cane exercises at home.    Pertinent History  Rt RTC repair 2017    Limitations  Lifting;Writing;House hold activities    Patient Stated Goals  regain function/use of Lt shoulder    Currently in Pain?  Yes    Pain Score  3     Pain Orientation  Left    Pain Descriptors / Indicators  Aching;Sore    Pain Type  Surgical pain    Pain Onset  More than a month ago                       [facility] Adult [facility] Treatment/Exercise - 05/09/19 0001      Exercises   Exercises  Shoulder      Shoulder Exercises: Supine   External Rotation  AAROM;PROM;Left;10 reps    External Rotation Limitations  ROM to 25 degrees    Flexion  PROM;AAROM;Left;10 reps  Shoulder Exercises: Standing   Internal Rotation  AAROM;Left    Internal Rotation Limitations  moving cane up hips bilateral UEs      Shoulder Exercises: Isometric Strengthening   Flexion  5X10"    Extension  5X10"    External Rotation  5X10"      Modalities   Modalities  Vasopneumatic      Vasopneumatic   Number Minutes Vasopneumatic   15 minutes    Vasopnuematic Location   Shoulder    Vasopneumatic Pressure  Low    Vasopneumatic Temperature   34      Manual Therapy   Manual Therapy  Soft tissue mobilization;Passive ROM    Manual therapy comments  in supine and sidelying    Soft tissue mobilization  STM: anterior shoulder, bicep tendon/muscle belly, pects, and anterior/mid deltoid    Scapular Mobilization  scapular mobilization             PT Education - 05/09/19 1255     Education Details  reviewed technique with cane AAROM    Person(s) Educated  Patient    Methods  Explanation;Demonstration    Comprehension  Verbalized understanding;Returned demonstration       PT Short Term Goals - 05/09/19 1256      PT SHORT TERM GOAL #1   Title  independent with initial HEP    Time  6    Period  Weeks    Status  Achieved    Target Date  05/02/19      PT SHORT TERM GOAL #2   Title  improve Lt shoulder PROM to limits of protocol (flexion 90 deg; er 20 deg) in preparation to move into next phase of protocol    Time  6    Period  Weeks    Status  Achieved    Target Date  05/02/19      PT SHORT TERM GOAL #3   Title  report pain < 4/10 with PROM for improved pain and tolerance    Time  6    Period  Weeks    Target Date  05/02/19        PT Long Term Goals - 05/09/19 1257      PT LONG TERM GOAL #1   Title  independent with advanced HEP    Time  6    Period  Weeks    Status  New      PT LONG TERM GOAL #2   Title  FOTO score improved to </= 44% limited for improved function    Time  6    Period  Weeks      PT LONG TERM GOAL #3   Title  demonstrate Lt shoulder strength at least 3/5 for improved function    Time  6    Period  Weeks    Status  New      PT LONG TERM GOAL #4   Title  Lt shoulder AROM improved to within 5 deg of Rt shoulder for flexion, abduction and external rotation for improved function    Time  6    Period  Weeks    Status  New      PT LONG TERM GOAL #5   Title  report pain < 2/10 for improved function and mobility    Time  6    Period  Weeks    Status  New              Patient will benefit from  skilled therapeutic intervention in order to improve the following deficits and impairments:     Visit Diagnosis: Stiffness of left shoulder, not elsewhere classified  Acute pain of left shoulder  Chronic left shoulder pain  Muscle weakness (generalized)  Abnormal posture     Problem List Patient Active Problem  List   Diagnosis Date Noted  . Thrombocytopenia ([place]) 02/03/2019  . Pneumonia due to COVID-19 virus 12/20/2018  . Mass of left thigh 05/24/2018  . History of DVT (deep vein thrombosis) 05/09/2018  . Pain and swelling of left lower extremity 05/09/2018  . Cold sore 04/26/2018  . Primary insomnia 10/30/2017  . S/P cervical spinal fusion 09/28/2017  . Superficial venous thrombosis of right arm 01/05/2017  . Left shoulder pain 01/05/2017  . Depression 05/02/2016  . Ingrown right big toenail 02/16/2016  . Obesity 01/23/2015  . Right shoulder pain 01/23/2015  . Former smoker 08/28/2014  . Nephrolithiasis 07/25/2014  . Diabetes mellitus, type 2 ([place]) 07/25/2014  . Essential hypertension, benign 07/25/2014  . Annual physical exam 07/25/2014  . Hyperlipidemia 07/25/2014    Oretha Caprice, PT 05/09/2019, 1:03 PM  [facility] [place], [place], [address] Phone: [phone]   Fax:  [phone]  Name: Robert Hines MRN: [id number] Date of Birth: 11-29-1955

## 2019-05-10 ENCOUNTER — Encounter: Payer: 59 | Admitting: Rehabilitative and Restorative Service Providers"

## 2019-05-13 ENCOUNTER — Other Ambulatory Visit: Payer: Self-pay

## 2019-05-13 ENCOUNTER — Ambulatory Visit (INDEPENDENT_AMBULATORY_CARE_PROVIDER_SITE_OTHER): Payer: 59 | Admitting: Rehabilitative and Restorative Service Providers"

## 2019-05-13 DIAGNOSIS — M25512 Pain in left shoulder: Secondary | ICD-10-CM

## 2019-05-13 DIAGNOSIS — R293 Abnormal posture: Secondary | ICD-10-CM

## 2019-05-13 DIAGNOSIS — M6281 Muscle weakness (generalized): Secondary | ICD-10-CM

## 2019-05-13 DIAGNOSIS — G8929 Other chronic pain: Secondary | ICD-10-CM

## 2019-05-13 DIAGNOSIS — M25612 Stiffness of left shoulder, not elsewhere classified: Secondary | ICD-10-CM | POA: Diagnosis not present

## 2019-05-13 DIAGNOSIS — R29898 Other symptoms and signs involving the musculoskeletal system: Secondary | ICD-10-CM

## 2019-05-13 NOTE — Therapy (Signed)
[place], [place], [address] Phone: [phone]   Fax:  [phone]  Physical Therapy Treatment  Patient Details  Name: Robert Hines MRN: [id number] Date of Birth: Nov 17, 1955 Referring Provider (PT): Tania Ade, MD   Encounter Date: 05/13/2019  PT End of Session - 05/13/19 1518    Visit Number  15    Number of Visits  30    Date for PT Re-Evaluation  06/27/19    PT Start Time  4481    PT Stop Time  1600    PT Time Calculation (min)  42 min    Activity Tolerance  Patient tolerated treatment well;No increased pain    Behavior During Therapy  WFL for tasks assessed/performed       Past Medical History:  Diagnosis Date  . Arthritis    neck  . Depression   . Diabetes mellitus without complication ([place])    type 2  . Hyperlipidemia   . Hypertension   . Pneumonia    covid pneumonia and covid july 2020, d/ced july 16th from [place] stayed 9 days  . Renal disorder    calculi, multiple lithotripsies, stents  . Right cataract   . Rotator cuff disorder   . Spinal pain     Past Surgical History:  Procedure Laterality Date  . ANTERIOR FUSION CERVICAL SPINE  2007  . ARTHOSCOPIC ROTAOR CUFF REPAIR Left 03/21/2019   Procedure: ARTHROSCOPIC ROTATOR CUFF REPAIR;  Surgeon: Tania Ade, MD;  Location: [facility];  Service: Orthopedics;  Laterality: Left;  . EYE SURGERY Left    cataract  . FOOT SURGERY Right    pin placed and removed  . HERNIA REPAIR     inguinal  . LITHOTRIPSY     several  . SHOULDER ARTHROSCOPY WITH ROTATOR CUFF REPAIR AND SUBACROMIAL DECOMPRESSION Right 12/28/2015   Procedure: SHOULDER ARTHROSCOPY TAKE DOWN ROTATOR CUFF REPAIR AND SUBACROMIAL DECOMPRESSION, DEBRIDEMENT OF SLAP TEAR;  Surgeon: Tania Ade, MD;  Location: [facility];  Service: Orthopedics;  Laterality: Right;  SHOULDER ARTHROSCOPY TAKE DOWN ROTATOR CUFF REPAIR  AND SUBACROMIAL DECOMPRESSION  . SHOULDER ARTHROSCOPY WITH SUBACROMIAL DECOMPRESSION Left 03/21/2019   Procedure: SHOULDER ARTHROSCOPY WITH SUBACROMIAL DECOMPRESSION;  Surgeon: Tania Ade, MD;  Location: [place];  Service: Orthopedics;  Laterality: Left;  [name] VASECTOMY Bilateral     There were no vitals filed for this visit.  Subjective Assessment - 05/13/19 1518    Subjective  The patient reports he is not in pain at rest.  He is doing exercises at home and has pain with exercise.    Pertinent History  Rt RTC repair 2017    Patient Stated Goals  regain function/use of Lt shoulder    Currently in Pain?  Yes    Pain Score  0-No pain    Pain Location  Shoulder    Pain Orientation  Left    Pain Onset  More than a month ago    Pain Frequency  Intermittent    Aggravating Factors   movement    Pain Relieving Factors  pain meds, muscle relaxers                       [facility] Adult PT Treatment/Exercise - 05/13/19 1525      Exercises   Exercises  Shoulder      Shoulder Exercises: Supine   Horizontal ABduction  AROM;Left;10 reps;AAROM   cane  External Rotation  AAROM;PROM    External Rotation Limitations  ROM to 38 degrees    Flexion  AAROM;Left;12 reps;PROM    Flexion Limitations  to 124 degrees with cane    ABduction  PROM;Left;10 reps    Other Supine Exercises  Cane chest press x 10 reps AAROM    Other Supine Exercises  performed supine IR/ER isometrics with manual resistance      Shoulder Exercises: Standing   Retraction  10 reps;Strengthening;Right;Left      Modalities   Modalities  Vasopneumatic      Vasopneumatic   Number Minutes Vasopneumatic   10 minutes    Vasopnuematic Location   Shoulder    Vasopneumatic Pressure  Low    Vasopneumatic Temperature   34      Manual Therapy   Manual Therapy  Soft tissue mobilization;Passive ROM    Manual therapy comments  in supine and sidelying    Soft tissue mobilization  STM: anterior  shoulder, bicep tendon/muscle belly, pects, and anterior/mid deltoid (IASTM)    Scapular Mobilization  scapular mobilization   in L sidelying              PT Short Term Goals - 05/13/19 1605      PT SHORT TERM GOAL #1   Title  independent with initial HEP    Time  6    Period  Weeks    Status  Achieved    Target Date  05/02/19      PT SHORT TERM GOAL #2   Title  improve Lt shoulder PROM to limits of protocol (flexion 90 deg; er 20 deg) in preparation to move into next phase of protocol    Time  6    Period  Weeks    Status  Achieved    Target Date  05/02/19      PT SHORT TERM GOAL #3   Title  report pain < 4/10 with PROM for improved pain and tolerance    Baseline  notes "soreness" pain level varies    Time  6    Period  Weeks    Status  Partially Met    Target Date  05/02/19        PT Long Term Goals - 05/09/19 1257      PT LONG TERM GOAL #1   Title  independent with advanced HEP    Time  6    Period  Weeks    Status  New      PT LONG TERM GOAL #2   Title  FOTO score improved to </= 44% limited for improved function    Time  6    Period  Weeks      PT LONG TERM GOAL #3   Title  demonstrate Lt shoulder strength at least 3/5 for improved function    Time  6    Period  Weeks    Status  New      PT LONG TERM GOAL #4   Title  Lt shoulder AROM improved to within 5 deg of Rt shoulder for flexion, abduction and external rotation for improved function    Time  6    Period  Weeks    Status  New      PT LONG TERM GOAL #5   Title  report pain < 2/10 for improved function and mobility    Time  6    Period  Weeks    Status  New  Plan - 05/13/19 1605    Clinical Impression Statement  The patient is improving AAROM with cane for HEP with flexion 124 degrees and ER up to 38 degrees (scaption for starting position).  PT continues to progress isometric shoulder strengthening, PROM, AAROM and improve soft tissue mobility.  Plan to progress per  protocol.    PT Treatment/Interventions  ADLs/Self Care Home Management;Cryotherapy;Electrical Stimulation;Ultrasound;Moist Heat;Iontophoresis 60m/ml Dexamethasone;Therapeutic activities;Therapeutic exercise;Patient/family education;Manual techniques;Passive range of motion;Scar mobilization;Vasopneumatic Device;Taping    PT Next Visit Plan  Continue progressing shoulder per protocol    PT Home Exercise Plan  Access Code: [email or web address]   Consulted and Agree with Plan of Care  Patient       Patient will benefit from skilled therapeutic intervention in order to improve the following deficits and impairments:  Decreased range of motion, Increased fascial restricitons, Increased muscle spasms, Pain, Postural dysfunction, Decreased strength  Visit Diagnosis: Stiffness of left shoulder, not elsewhere classified  Acute pain of left shoulder  Chronic left shoulder pain  Muscle weakness (generalized)  Abnormal posture  Other symptoms and signs involving the musculoskeletal system     Problem List Patient Active Problem List   Diagnosis Date Noted  . Thrombocytopenia ([place] 02/03/2019  . Pneumonia due to COVID-19 virus 12/20/2018  . Mass of left thigh 05/24/2018  . History of DVT (deep vein thrombosis) 05/09/2018  . Pain and swelling of left lower extremity 05/09/2018  . Cold sore 04/26/2018  . Primary insomnia 10/30/2017  . S/P cervical spinal fusion 09/28/2017  . Superficial venous thrombosis of right arm 01/05/2017  . Left shoulder pain 01/05/2017  . Depression 05/02/2016  . Ingrown right big toenail 02/16/2016  . Obesity 01/23/2015  . Right shoulder pain 01/23/2015  . Former smoker 08/28/2014  . Nephrolithiasis 07/25/2014  . Diabetes mellitus, type 2 ([place] 07/25/2014  . Essential hypertension, benign 07/25/2014  . Annual physical exam 07/25/2014  . Hyperlipidemia 07/25/2014    Philena Obey 05/13/2019, 7:40 PM  [facility] [address]Phone: [phone]  Fax:  [phone] Name: Robert GONCALVESMRN: [id number] of Birth: [birth year]

## 2019-05-15 ENCOUNTER — Other Ambulatory Visit: Payer: Self-pay

## 2019-05-15 ENCOUNTER — Ambulatory Visit (INDEPENDENT_AMBULATORY_CARE_PROVIDER_SITE_OTHER): Payer: 59 | Admitting: Physical Therapy

## 2019-05-15 ENCOUNTER — Encounter: Payer: Self-pay | Admitting: Physical Therapy

## 2019-05-15 DIAGNOSIS — M25512 Pain in left shoulder: Secondary | ICD-10-CM | POA: Diagnosis not present

## 2019-05-15 DIAGNOSIS — G8929 Other chronic pain: Secondary | ICD-10-CM

## 2019-05-15 DIAGNOSIS — M6281 Muscle weakness (generalized): Secondary | ICD-10-CM

## 2019-05-15 DIAGNOSIS — M25612 Stiffness of left shoulder, not elsewhere classified: Secondary | ICD-10-CM | POA: Diagnosis not present

## 2019-05-15 NOTE — Therapy (Signed)
[place], [place], [address] Phone: [phone]   Fax:  [phone]  Physical Therapy Treatment  Patient Details  Name: Robert Hines MRN: [id number] Date of Birth: 12/01/1955 Referring Provider (PT): Tania Ade, MD   Encounter Date: 05/15/2019  PT End of Session - 05/15/19 1018    Visit Number  16    Number of Visits  30    Date for PT Re-Evaluation  06/27/19    PT Start Time  1019    PT Stop Time  1112    PT Time Calculation (min)  53 min    Activity Tolerance  Patient tolerated treatment well;No increased pain    Behavior During Therapy  WFL for tasks assessed/performed       Past Medical History:  Diagnosis Date  . Arthritis    neck  . Depression   . Diabetes mellitus without complication ([place])    type 2  . Hyperlipidemia   . Hypertension   . Pneumonia    covid pneumonia and covid july 2020, d/ced july 16th from [place] stayed 9 days  . Renal disorder    calculi, multiple lithotripsies, stents  . Right cataract   . Rotator cuff disorder   . Spinal pain     Past Surgical History:  Procedure Laterality Date  . ANTERIOR FUSION CERVICAL SPINE  2007  . ARTHOSCOPIC ROTAOR CUFF REPAIR Left 03/21/2019   Procedure: ARTHROSCOPIC ROTATOR CUFF REPAIR;  Surgeon: Tania Ade, MD;  Location: [facility];  Service: Orthopedics;  Laterality: Left;  . EYE SURGERY Left    cataract  . FOOT SURGERY Right    pin placed and removed  . HERNIA REPAIR     inguinal  . LITHOTRIPSY     several  . SHOULDER ARTHROSCOPY WITH ROTATOR CUFF REPAIR AND SUBACROMIAL DECOMPRESSION Right 12/28/2015   Procedure: SHOULDER ARTHROSCOPY TAKE DOWN ROTATOR CUFF REPAIR AND SUBACROMIAL DECOMPRESSION, DEBRIDEMENT OF SLAP TEAR;  Surgeon: Tania Ade, MD;  Location: [place];  Service: Orthopedics;  Laterality: Right;  SHOULDER ARTHROSCOPY TAKE DOWN ROTATOR CUFF REPAIR  AND SUBACROMIAL DECOMPRESSION  . SHOULDER ARTHROSCOPY WITH SUBACROMIAL DECOMPRESSION Left 03/21/2019   Procedure: SHOULDER ARTHROSCOPY WITH SUBACROMIAL DECOMPRESSION;  Surgeon: Tania Ade, MD;  Location: [place];  Service: Orthopedics;  Laterality: Left;  [name] VASECTOMY Bilateral     There were no vitals filed for this visit.  Subjective Assessment - 05/15/19 1020    Subjective  Patient has soreness today. "It's going to rain".    Pertinent History  Rt RTC repair 2017    Limitations  Lifting;Writing;House hold activities    Currently in Pain?  Yes    Pain Score  2     Pain Location  Shoulder    Pain Orientation  Left    Pain Descriptors / Indicators  Aching;Sore                       [facility] Adult PT Treatment/Exercise - 05/15/19 0001      Shoulder Exercises: Supine   Horizontal ABduction  AROM;Left;10 reps;AAROM   cane   External Rotation  AAROM;Left;20 reps    Flexion  AAROM;Left;PROM;20 reps    ABduction  AAROM;Left;10 reps    Other Supine Exercises  Cane chest press x 10 reps AAROM      Shoulder Exercises: Prone   Other Prone Exercises  hanging arm with small pendulum for relaxation  Other Prone Exercises  prone row x 15      Modalities   Modalities  Vasopneumatic      Vasopneumatic   Number Minutes Vasopneumatic   10 minutes    Vasopnuematic Location   Shoulder    Vasopneumatic Pressure  Medium    Vasopneumatic Temperature   34      Manual Therapy   Manual Therapy  Soft tissue mobilization;Scapular mobilization;Passive ROM    Soft tissue mobilization  to left pec minor; lats, post cuff in prone    Myofascial Release  TPR with AA motion to subscapularis in supine    Scapular Mobilization  in supine into protraction    Passive ROM  into IR, ER and flexion               PT Short Term Goals - 05/13/19 1605      PT SHORT TERM GOAL #1   Title  independent with initial HEP    Time  6    Period  Weeks    Status   Achieved    Target Date  05/02/19      PT SHORT TERM GOAL #2   Title  improve Lt shoulder PROM to limits of protocol (flexion 90 deg; er 20 deg) in preparation to move into next phase of protocol    Time  6    Period  Weeks    Status  Achieved    Target Date  05/02/19      PT SHORT TERM GOAL #3   Title  report pain < 4/10 with PROM for improved pain and tolerance    Baseline  notes "soreness" pain level varies    Time  6    Period  Weeks    Status  Partially Met    Target Date  05/02/19        PT Long Term Goals - 05/09/19 1257      PT LONG TERM GOAL #1   Title  independent with advanced HEP    Time  6    Period  Weeks    Status  New      PT LONG TERM GOAL #2   Title  FOTO score improved to </= 44% limited for improved function    Time  6    Period  Weeks      PT LONG TERM GOAL #3   Title  demonstrate Lt shoulder strength at least 3/5 for improved function    Time  6    Period  Weeks    Status  New      PT LONG TERM GOAL #4   Title  Lt shoulder AROM improved to within 5 deg of Rt shoulder for flexion, abduction and external rotation for improved function    Time  6    Period  Weeks    Status  New      PT LONG TERM GOAL #5   Title  report pain < 2/10 for improved function and mobility    Time  6    Period  Weeks    Status  New            Plan - 05/15/19 1114    Clinical Impression Statement  Patient did well with TE today. Still very tight in left subscapularis, pec minor and post cuff muscles and responded well to manual/STW. Tolerated prone rows with no pain. Not much relief with prone arm hanging.    Comorbidities  bil RTC repairs;  HTN, COVID+ 12/2018, depression, DM, OA    PT Frequency  3x / week    PT Duration  12 weeks    PT Treatment/Interventions  ADLs/Self Care Home Management;Cryotherapy;Electrical Stimulation;Ultrasound;Moist Heat;Iontophoresis 70m/ml Dexamethasone;Therapeutic activities;Therapeutic exercise;Patient/family education;Manual  techniques;Passive range of motion;Scar mobilization;Vasopneumatic Device;Taping    PT Next Visit Plan  Continue with STW/MFR to subscapularis and post cuff; Continue progressing shoulder ROM  and strengthening per protocol.    PT Home Exercise Plan  Access Code: [email or web address]   Consulted and Agree with Plan of Care  Patient       Patient will benefit from skilled therapeutic intervention in order to improve the following deficits and impairments:  Decreased range of motion, Increased fascial restricitons, Increased muscle spasms, Pain, Postural dysfunction, Decreased strength  Visit Diagnosis: Stiffness of left shoulder, not elsewhere classified  Acute pain of left shoulder  Chronic left shoulder pain  Muscle weakness (generalized)     Problem List Patient Active Problem List   Diagnosis Date Noted  . Thrombocytopenia ([place] 02/03/2019  . Pneumonia due to COVID-19 virus 12/20/2018  . Mass of left thigh 05/24/2018  . History of DVT (deep vein thrombosis) 05/09/2018  . Pain and swelling of left lower extremity 05/09/2018  . Cold sore 04/26/2018  . Primary insomnia 10/30/2017  . S/P cervical spinal fusion 09/28/2017  . Superficial venous thrombosis of right arm 01/05/2017  . Left shoulder pain 01/05/2017  . Depression 05/02/2016  . Ingrown right big toenail 02/16/2016  . Obesity 01/23/2015  . Right shoulder pain 01/23/2015  . Former smoker 08/28/2014  . Nephrolithiasis 07/25/2014  . Diabetes mellitus, type 2 ([place] 07/25/2014  . Essential hypertension, benign 07/25/2014  . Annual physical exam 07/25/2014  . Hyperlipidemia 07/25/2014    JMadelyn FlavorsPT 05/15/2019, 11:19 AM  [facility] [address]Phone: [phone]  Fax:  [phone] Name: GRAYMEL CULLMRN: [id number] of Birth: [birth year]

## 2019-05-20 ENCOUNTER — Ambulatory Visit: Payer: 59 | Admitting: Sports Medicine

## 2019-05-21 ENCOUNTER — Ambulatory Visit (INDEPENDENT_AMBULATORY_CARE_PROVIDER_SITE_OTHER): Payer: 59 | Admitting: Physical Therapy

## 2019-05-21 ENCOUNTER — Other Ambulatory Visit: Payer: Self-pay

## 2019-05-21 ENCOUNTER — Encounter: Payer: Self-pay | Admitting: Physical Therapy

## 2019-05-21 DIAGNOSIS — R293 Abnormal posture: Secondary | ICD-10-CM | POA: Diagnosis not present

## 2019-05-21 DIAGNOSIS — M25612 Stiffness of left shoulder, not elsewhere classified: Secondary | ICD-10-CM | POA: Diagnosis not present

## 2019-05-21 DIAGNOSIS — G8929 Other chronic pain: Secondary | ICD-10-CM

## 2019-05-21 DIAGNOSIS — M6281 Muscle weakness (generalized): Secondary | ICD-10-CM

## 2019-05-21 DIAGNOSIS — M25512 Pain in left shoulder: Secondary | ICD-10-CM

## 2019-05-21 NOTE — Therapy (Signed)
[place], [place], [address] Phone: [phone]   Fax:  [phone]  Physical Therapy Treatment  Patient Details  Name: Robert Hines MRN: [id number] Date of Birth: [birth year] Referring Provider (PT): Tania Ade, MD   Encounter Date: 05/21/2019  PT End of Session - 05/21/19 0927    Visit Number  17    Number of Visits  30    Date for PT Re-Evaluation  06/27/19    PT Start Time  0927    PT Stop Time  1018    PT Time Calculation (min)  51 min    Activity Tolerance  Patient tolerated treatment well;No increased pain    Behavior During Therapy  WFL for tasks assessed/performed       Past Medical History:  Diagnosis Date  . Arthritis    neck  . Depression   . Diabetes mellitus without complication ([place])    type 2  . Hyperlipidemia   . Hypertension   . Pneumonia    covid pneumonia and covid july 2020, d/ced july 16th from [place] stayed 9 days  . Renal disorder    calculi, multiple lithotripsies, stents  . Right cataract   . Rotator cuff disorder   . Spinal pain     Past Surgical History:  Procedure Laterality Date  . ANTERIOR FUSION CERVICAL SPINE  2007  . ARTHOSCOPIC ROTAOR CUFF REPAIR Left 03/21/2019   Procedure: ARTHROSCOPIC ROTATOR CUFF REPAIR;  Surgeon: Tania Ade, MD;  Location: [facility];  Service: Orthopedics;  Laterality: Left;  . EYE SURGERY Left    cataract  . FOOT SURGERY Right    pin placed and removed  . HERNIA REPAIR     inguinal  . LITHOTRIPSY     several  . SHOULDER ARTHROSCOPY WITH ROTATOR CUFF REPAIR AND SUBACROMIAL DECOMPRESSION Right 12/28/2015   Procedure: SHOULDER ARTHROSCOPY TAKE DOWN ROTATOR CUFF REPAIR AND SUBACROMIAL DECOMPRESSION, DEBRIDEMENT OF SLAP TEAR;  Surgeon: Tania Ade, MD;  Location: [facility];  Service: Orthopedics;  Laterality: Right;  SHOULDER ARTHROSCOPY TAKE DOWN ROTATOR CUFF REPAIR  AND SUBACROMIAL DECOMPRESSION  . SHOULDER ARTHROSCOPY WITH SUBACROMIAL DECOMPRESSION Left 03/21/2019   Procedure: SHOULDER ARTHROSCOPY WITH SUBACROMIAL DECOMPRESSION;  Surgeon: Tania Ade, MD;  Location: [place];  Service: Orthopedics;  Laterality: Left;  [name] VASECTOMY Bilateral     There were no vitals filed for this visit.  Subjective Assessment - 05/21/19 0928    Subjective  Pt states that his shoulder has been bothering him; has difficulty sleeping at night. He has to take pain meds at night to help with sleep. He said that he went to see the doctor yesterday due to this problem; he was told this is normal.     Currently in Pain?  No/denies    Pain Orientation  Left    Pain Type  Surgical pain    Aggravating Factors   movement; sleep    Pain Relieving Factors  pain meds, muscle relaxers         [facility] PT Assessment - 05/21/19 0001      Assessment   Medical Diagnosis  RTC repair    Referring Provider (PT)  Tania Ade, MD    Onset Date/Surgical Date  03/21/19    Hand Dominance  Right    Next MD Visit  05/01/19    Prior Therapy  following Rt RTC repair      PROM   Right/Left Shoulder  Left    Left Shoulder Flexion  115 Degrees   AAROM with cane in supine   Left Shoulder External Rotation  38 Degrees   AAROM with cane in supine       [facility] Adult PT Treatment/Exercise - 05/21/19 0001      Shoulder Exercises: Supine   External Rotation  AAROM;Left;5 reps   with cane, 5-10 sec holds    Flexion  AAROM;Both   7 reps with cane , 5-10 sec holds     Shoulder Exercises: Standing   Internal Rotation  AAROM;Left;5 reps   With strap; self assist, 10-15 sec holds (limited tolerance)   Internal Rotation Limitations  limited tolerance    Extension  AAROM;Both;10 reps   5 sec with cane     Shoulder Exercises: Isometric Strengthening   Flexion  5X10"   cues for upright posture   Extension  5X10"    External Rotation  5X10"   cues for Lt UE position     Internal Rotation  5X10"   cues for Lt UE position    ABduction  5X10"   pt reports pain in shoulder; cues for submax contraction     Vasopneumatic   Number Minutes Vasopneumatic   10 minutes    Vasopnuematic Location   Shoulder    Vasopneumatic Pressure  Medium    Vasopneumatic Temperature   34      Manual Therapy   Soft tissue mobilization  STM to [employer], ant delt, middle delt, bicep    Passive ROM  Lt UE into internal rot, external rot., flexion,               PT Short Term Goals - 05/13/19 1605      PT SHORT TERM GOAL #1   Title  independent with initial HEP    Time  6    Period  Weeks    Status  Achieved    Target Date  05/02/19      PT SHORT TERM GOAL #2   Title  improve Lt shoulder PROM to limits of protocol (flexion 90 deg; er 20 deg) in preparation to move into next phase of protocol    Time  6    Period  Weeks    Status  Achieved    Target Date  05/02/19      PT SHORT TERM GOAL #3   Title  report pain < 4/10 with PROM for improved pain and tolerance    Baseline  notes "soreness" pain level varies    Time  6    Period  Weeks    Status  Partially Met    Target Date  05/02/19        PT Long Term Goals - 05/09/19 1257      PT LONG TERM GOAL #1   Title  independent with advanced HEP    Time  6    Period  Weeks    Status  New      PT LONG TERM GOAL #2   Title  FOTO score improved to </= 44% limited for improved function    Time  6    Period  Weeks      PT LONG TERM GOAL #3   Title  demonstrate Lt shoulder strength at least 3/5 for improved function    Time  6    Period  Weeks    Status  New      PT LONG TERM GOAL #4  Title  Lt shoulder AROM improved to within 5 deg of Rt shoulder for flexion, abduction and external rotation for improved function    Time  6    Period  Weeks    Status  New      PT LONG TERM GOAL #5   Title  report pain < 2/10 for improved function and mobility    Time  6    Period  Weeks    Status  New             Plan - 05/21/19 1139    Clinical Impression Statement  Pt had limited tolerance to Lt shoulder internal rot. exercise and isometric abduction; otherwise tolerated all other exercises well. AAROM Lt shoulder flexion and external rot. Improved. Pt reported reduction in pain with STM and vasopneumatic to Lt shoulder at end of session. Pt gradually making progress toward LTG #5; continues to make progress toward all stated PT goals for functional mobility.    Comorbidities  bil RTC repairs; HTN, COVID+ 12/2018, depression, DM, OA    Rehab Potential  Good    PT Frequency  3x / week    PT Duration  12 weeks    PT Treatment/Interventions  ADLs/Self Care Home Management;Cryotherapy;Electrical Stimulation;Ultrasound;Moist Heat;Iontophoresis 4m/ml Dexamethasone;Therapeutic activities;Therapeutic exercise;Patient/family education;Manual techniques;Passive range of motion;Scar mobilization;Vasopneumatic Device;Taping    PT Next Visit Plan  Continue progressing shoulder ROM  and strengthening per protocol and pt tolerance    PT Home Exercise Plan  Access Code: [email or web address]   Consulted and Agree with Plan of Care  Patient       Patient will benefit from skilled therapeutic intervention in order to improve the following deficits and impairments:  Decreased range of motion, Increased fascial restricitons, Increased muscle spasms, Pain, Postural dysfunction, Decreased strength  Visit Diagnosis: Stiffness of left shoulder, not elsewhere classified  Acute pain of left shoulder  Chronic left shoulder pain  Muscle weakness (generalized)  Abnormal posture     Problem List Patient Active Problem List   Diagnosis Date Noted  . Thrombocytopenia ([place] 02/03/2019  . Pneumonia due to COVID-19 virus 12/20/2018  . Mass of left thigh 05/24/2018  . History of DVT (deep vein thrombosis) 05/09/2018  . Pain and swelling of left lower extremity 05/09/2018  . Cold sore 04/26/2018  . Primary insomnia  10/30/2017  . S/P cervical spinal fusion 09/28/2017  . Superficial venous thrombosis of right arm 01/05/2017  . Left shoulder pain 01/05/2017  . Depression 05/02/2016  . Ingrown right big toenail 02/16/2016  . Obesity 01/23/2015  . Right shoulder pain 01/23/2015  . Former smoker 08/28/2014  . Nephrolithiasis 07/25/2014  . Diabetes mellitus, type 2 ([place] 07/25/2014  . Essential hypertension, benign 07/25/2014  . Annual physical exam 07/25/2014  . Hyperlipidemia 07/25/2014    KGardiner Rhyme SPTA 05/21/2019, 11:52 AM  JKerin Perna PTA 05/21/19 1:09 PM  CA[facility] [address]Phone: [phone]  Fax:  [phone] Name: GDMITRI PETTIGREWMRN: [id number] of Birth: [birth year]

## 2019-05-23 ENCOUNTER — Other Ambulatory Visit: Payer: Self-pay

## 2019-05-23 ENCOUNTER — Encounter: Payer: Self-pay | Admitting: Physical Therapy

## 2019-05-23 ENCOUNTER — Ambulatory Visit (INDEPENDENT_AMBULATORY_CARE_PROVIDER_SITE_OTHER): Payer: 59 | Admitting: Physical Therapy

## 2019-05-23 DIAGNOSIS — M25612 Stiffness of left shoulder, not elsewhere classified: Secondary | ICD-10-CM | POA: Diagnosis not present

## 2019-05-23 DIAGNOSIS — M25512 Pain in left shoulder: Secondary | ICD-10-CM | POA: Diagnosis not present

## 2019-05-23 DIAGNOSIS — G8929 Other chronic pain: Secondary | ICD-10-CM

## 2019-05-23 DIAGNOSIS — R29898 Other symptoms and signs involving the musculoskeletal system: Secondary | ICD-10-CM

## 2019-05-23 DIAGNOSIS — R293 Abnormal posture: Secondary | ICD-10-CM

## 2019-05-23 DIAGNOSIS — M6281 Muscle weakness (generalized): Secondary | ICD-10-CM | POA: Diagnosis not present

## 2019-05-23 NOTE — Therapy (Signed)
[place], [place], [address] Phone: [phone]   Fax:  [phone]  Physical Therapy Treatment  Patient Details  Name: Robert Hines MRN: [id number] Date of Birth: 09-13-1955 Referring Provider (PT): Tania Ade, MD   Encounter Date: 05/23/2019  PT End of Session - 05/23/19 1020    Visit Number  18    Number of Visits  30    Date for PT Re-Evaluation  06/27/19    PT Start Time  9892    PT Stop Time  1102    PT Time Calculation (min)  47 min    Activity Tolerance  Patient tolerated treatment well;No increased pain    Behavior During Therapy  WFL for tasks assessed/performed       Past Medical History:  Diagnosis Date  . Arthritis    neck  . Depression   . Diabetes mellitus without complication ([place])    type 2  . Hyperlipidemia   . Hypertension   . Pneumonia    covid pneumonia and covid july 2020, d/ced july 16th from [place] stayed 9 days  . Renal disorder    calculi, multiple lithotripsies, stents  . Right cataract   . Rotator cuff disorder   . Spinal pain     Past Surgical History:  Procedure Laterality Date  . ANTERIOR FUSION CERVICAL SPINE  2007  . ARTHOSCOPIC ROTAOR CUFF REPAIR Left 03/21/2019   Procedure: ARTHROSCOPIC ROTATOR CUFF REPAIR;  Surgeon: Tania Ade, MD;  Location: [facility];  Service: Orthopedics;  Laterality: Left;  . EYE SURGERY Left    cataract  . FOOT SURGERY Right    pin placed and removed  . HERNIA REPAIR     inguinal  . LITHOTRIPSY     several  . SHOULDER ARTHROSCOPY WITH ROTATOR CUFF REPAIR AND SUBACROMIAL DECOMPRESSION Right 12/28/2015   Procedure: SHOULDER ARTHROSCOPY TAKE DOWN ROTATOR CUFF REPAIR AND SUBACROMIAL DECOMPRESSION, DEBRIDEMENT OF SLAP TEAR;  Surgeon: Tania Ade, MD;  Location: [facility];  Service: Orthopedics;  Laterality: Right;  SHOULDER ARTHROSCOPY TAKE DOWN ROTATOR CUFF REPAIR  AND SUBACROMIAL DECOMPRESSION  . SHOULDER ARTHROSCOPY WITH SUBACROMIAL DECOMPRESSION Left 03/21/2019   Procedure: SHOULDER ARTHROSCOPY WITH SUBACROMIAL DECOMPRESSION;  Surgeon: Tania Ade, MD;  Location: [facility];  Service: Orthopedics;  Laterality: Left;  [name] VASECTOMY Bilateral     There were no vitals filed for this visit.  Subjective Assessment - 05/23/19 1020    Subjective  Patient reports compliance with new IR stretching.    Patient Stated Goals  regain function/use of Lt shoulder    Currently in Pain?  No/denies                       [facility] Adult PT Treatment/Exercise - 05/23/19 0001      Shoulder Exercises: Supine   External Rotation  AAROM;10 reps    Flexion  AAROM;20 reps   cane     Shoulder Exercises: Standing   Internal Rotation  AAROM;Left;10 reps   cane   Flexion  AAROM;5 reps    Flexion Limitations  wall ladder cues to keep shoulder down and avoid SB    Extension  AAROM;Both;10 reps   5 sec hold     Shoulder Exercises: Pulleys   Flexion  3 minutes    Scaption  3 minutes      Modalities   Modalities  Vasopneumatic      Vasopneumatic  Number Minutes Vasopneumatic   10 minutes    Vasopnuematic Location   Shoulder    Vasopneumatic Pressure  Low    Vasopneumatic Temperature   34      Manual Therapy   Manual Therapy  Soft tissue mobilization;Joint mobilization;Passive ROM    Joint Mobilization  GH post and inf    Soft tissue mobilization  to left subscap and along lateral scapular border    Passive ROM  into flex, IR and ER with contract relax               PT Short Term Goals - 05/13/19 1605      PT SHORT TERM GOAL #1   Title  independent with initial HEP    Time  6    Period  Weeks    Status  Achieved    Target Date  05/02/19      PT SHORT TERM GOAL #2   Title  improve Lt shoulder PROM to limits of protocol (flexion 90 deg; er 20 deg) in preparation to move into next phase of protocol    Time  6     Period  Weeks    Status  Achieved    Target Date  05/02/19      PT SHORT TERM GOAL #3   Title  report pain < 4/10 with PROM for improved pain and tolerance    Baseline  notes "soreness" pain level varies    Time  6    Period  Weeks    Status  Partially Met    Target Date  05/02/19        PT Long Term Goals - 05/09/19 1257      PT LONG TERM GOAL #1   Title  independent with advanced HEP    Time  6    Period  Weeks    Status  New      PT LONG TERM GOAL #2   Title  FOTO score improved to </= 44% limited for improved function    Time  6    Period  Weeks      PT LONG TERM GOAL #3   Title  demonstrate Lt shoulder strength at least 3/5 for improved function    Time  6    Period  Weeks    Status  New      PT LONG TERM GOAL #4   Title  Lt shoulder AROM improved to within 5 deg of Rt shoulder for flexion, abduction and external rotation for improved function    Time  6    Period  Weeks    Status  New      PT LONG TERM GOAL #5   Title  report pain < 2/10 for improved function and mobility    Time  6    Period  Weeks    Status  New            Plan - 05/23/19 1258    Clinical Impression Statement  Patient tolerated pulleys well. Still having pain with IR stretching and requiring cues for correct posture.    Comorbidities  bil RTC repairs; HTN, COVID+ 12/2018, depression, DM, OA    PT Treatment/Interventions  ADLs/Self Care Home Management;Cryotherapy;Electrical Stimulation;Ultrasound;Moist Heat;Iontophoresis 34m/ml Dexamethasone;Therapeutic activities;Therapeutic exercise;Patient/family education;Manual techniques;Passive range of motion;Scar mobilization;Vasopneumatic Device;Taping    PT Next Visit Plan  Continue progressing shoulder ROM  and strengthening per protocol and pt tolerance    PT Home Exercise Plan  Access Code:  [email or web address]       Patient will benefit from skilled therapeutic intervention in order to improve the following deficits and impairments:  Decreased  range of motion, Increased fascial restricitons, Increased muscle spasms, Pain, Postural dysfunction, Decreased strength  Visit Diagnosis: Stiffness of left shoulder, not elsewhere classified  Acute pain of left shoulder  Chronic left shoulder pain  Muscle weakness (generalized)  Abnormal posture  Other symptoms and signs involving the musculoskeletal system     Problem List Patient Active Problem List   Diagnosis Date Noted  . Thrombocytopenia ([place]) 02/03/2019  . Pneumonia due to COVID-19 virus 12/20/2018  . Mass of left thigh 05/24/2018  . History of DVT (deep vein thrombosis) 05/09/2018  . Pain and swelling of left lower extremity 05/09/2018  . Cold sore 04/26/2018  . Primary insomnia 10/30/2017  . S/P cervical spinal fusion 09/28/2017  . Superficial venous thrombosis of right arm 01/05/2017  . Left shoulder pain 01/05/2017  . Depression 05/02/2016  . Ingrown right big toenail 02/16/2016  . Obesity 01/23/2015  . Right shoulder pain 01/23/2015  . Former smoker 08/28/2014  . Nephrolithiasis 07/25/2014  . Diabetes mellitus, type 2 ([place]) 07/25/2014  . Essential hypertension, benign 07/25/2014  . Annual physical exam 07/25/2014  . Hyperlipidemia 07/25/2014    Madelyn Flavors PT 05/23/2019, 1:02 PM  [facility] [place], [place], [address] Phone: [phone]   Fax:  [phone]  Name: Robert Hines MRN: [id number] Date of Birth: 1955/09/21

## 2019-05-24 ENCOUNTER — Encounter: Payer: Self-pay | Admitting: Physical Therapy

## 2019-05-24 ENCOUNTER — Ambulatory Visit (INDEPENDENT_AMBULATORY_CARE_PROVIDER_SITE_OTHER): Payer: 59 | Admitting: Physical Therapy

## 2019-05-24 ENCOUNTER — Other Ambulatory Visit: Payer: Self-pay

## 2019-05-24 DIAGNOSIS — M25612 Stiffness of left shoulder, not elsewhere classified: Secondary | ICD-10-CM

## 2019-05-24 DIAGNOSIS — M6281 Muscle weakness (generalized): Secondary | ICD-10-CM

## 2019-05-24 DIAGNOSIS — M25512 Pain in left shoulder: Secondary | ICD-10-CM | POA: Diagnosis not present

## 2019-05-24 DIAGNOSIS — G8929 Other chronic pain: Secondary | ICD-10-CM | POA: Diagnosis not present

## 2019-05-24 NOTE — Therapy (Signed)
[place], [place], [address] Phone: [phone]   Fax:  [phone]  Physical Therapy Treatment  Patient Details  Name: Robert Hines MRN: [id number] Date of Birth: March 06, 1956 Referring Provider (PT): Tania Ade, MD   Encounter Date: 05/24/2019  PT End of Session - 05/24/19 0931    Visit Number  19    Number of Visits  30    Date for PT Re-Evaluation  06/27/19    PT Start Time  0931    PT Stop Time  1018    PT Time Calculation (min)  47 min    Activity Tolerance  Patient tolerated treatment well;No increased pain    Behavior During Therapy  WFL for tasks assessed/performed       Past Medical History:  Diagnosis Date  . Arthritis    neck  . Depression   . Diabetes mellitus without complication ([place])    type 2  . Hyperlipidemia   . Hypertension   . Pneumonia    covid pneumonia and covid july 2020, d/ced july 16th from [place] stayed 9 days  . Renal disorder    calculi, multiple lithotripsies, stents  . Right cataract   . Rotator cuff disorder   . Spinal pain     Past Surgical History:  Procedure Laterality Date  . ANTERIOR FUSION CERVICAL SPINE  2007  . ARTHOSCOPIC ROTAOR CUFF REPAIR Left 03/21/2019   Procedure: ARTHROSCOPIC ROTATOR CUFF REPAIR;  Surgeon: Tania Ade, MD;  Location: [facility];  Service: Orthopedics;  Laterality: Left;  . EYE SURGERY Left    cataract  . FOOT SURGERY Right    pin placed and removed  . HERNIA REPAIR     inguinal  . LITHOTRIPSY     several  . SHOULDER ARTHROSCOPY WITH ROTATOR CUFF REPAIR AND SUBACROMIAL DECOMPRESSION Right 12/28/2015   Procedure: SHOULDER ARTHROSCOPY TAKE DOWN ROTATOR CUFF REPAIR AND SUBACROMIAL DECOMPRESSION, DEBRIDEMENT OF SLAP TEAR;  Surgeon: Tania Ade, MD;  Location: [facility];  Service: Orthopedics;  Laterality: Right;  SHOULDER ARTHROSCOPY TAKE DOWN ROTATOR CUFF REPAIR  AND SUBACROMIAL DECOMPRESSION  . SHOULDER ARTHROSCOPY WITH SUBACROMIAL DECOMPRESSION Left 03/21/2019   Procedure: SHOULDER ARTHROSCOPY WITH SUBACROMIAL DECOMPRESSION;  Surgeon: Tania Ade, MD;  Location: [facility];  Service: Orthopedics;  Laterality: Left;  [name] VASECTOMY Bilateral     There were no vitals filed for this visit.  Subjective Assessment - 05/24/19 0933    Subjective  Pt states that he is getter better at sleep night with pain meds. States his shoulder felt better when he woke up this morning.    Currently in Pain?  Yes    Pain Score  2     Pain Location  Shoulder    Pain Orientation  Left    Pain Descriptors / Indicators  Sore    Pain Type  Surgical pain         [facility] PT Assessment - 05/24/19 0001      Assessment   Medical Diagnosis  RTC repair    Referring Provider (PT)  Tania Ade, MD    Onset Date/Surgical Date  03/21/19    Hand Dominance  Right    Next MD Visit  06/12/19    Prior Therapy  following Rt RTC repair      PROM   Right/Left Shoulder  Left    Left Shoulder Extension  40 Degrees   standing AAROM   Left Shoulder  External Rotation  51 Degrees  Supine, AAROM with cane       [facility] Adult PT Treatment/Exercise - 05/24/19 0001      Shoulder Exercises: Supine   External Rotation  AAROM;Left;10 reps   with cane; 5 sec hold   Flexion  AAROM;Left;10 reps   with cane; 5 sec hold   ABduction  AAROM;10 reps   with cane; scaption; 5 sec hold   ABduction Limitations  Limited tolerance to exercise      Shoulder Exercises: Standing   Internal Rotation  AAROM;Left;10 reps   with cane; 5 sec hold   Extension  AAROM;Both;10 reps   with cane; 5 sec hold     Shoulder Exercises: Pulleys   Flexion  --   10 reps 10 sec   Scaption  --   10 reps 10 sec     Vasopneumatic   Number Minutes Vasopneumatic   10 minutes    Vasopnuematic Location   Shoulder    Vasopneumatic Pressure  Low    Vasopneumatic Temperature   34      Manual  Therapy   Soft tissue mobilization  STM to Lt pec, subscalp, infraspinatus, rhomboids      Scapular Mobilization  in supine; laterally        PT Short Term Goals - 05/13/19 1605      PT SHORT TERM GOAL #1   Title  independent with initial HEP    Time  6    Period  Weeks    Status  Achieved    Target Date  05/02/19      PT SHORT TERM GOAL #2   Title  improve Lt shoulder PROM to limits of protocol (flexion 90 deg; er 20 deg) in preparation to move into next phase of protocol    Time  6    Period  Weeks    Status  Achieved    Target Date  05/02/19      PT SHORT TERM GOAL #3   Title  report pain < 4/10 with PROM for improved pain and tolerance    Baseline  notes "soreness" pain level varies    Time  6    Period  Weeks    Status  Partially Met    Target Date  05/02/19        PT Long Term Goals - 05/09/19 1257      PT LONG TERM GOAL #1   Title  independent with advanced HEP    Time  6    Period  Weeks    Status  New      PT LONG TERM GOAL #2   Title  FOTO score improved to </= 44% limited for improved function    Time  6    Period  Weeks      PT LONG TERM GOAL #3   Title  demonstrate Lt shoulder strength at least 3/5 for improved function    Time  6    Period  Weeks    Status  New      PT LONG TERM GOAL #4   Title  Lt shoulder AROM improved to within 5 deg of Rt shoulder for flexion, abduction and external rotation for improved function    Time  6    Period  Weeks    Status  New      PT LONG TERM GOAL #5   Title  report pain < 2/10 for improved function and mobility  Time  6    Period  Weeks    Status  New            Plan - 05/24/19 1138    Clinical Impression Statement  Pt with limited tolerance to AAROM Lt shoulder scaption in supine due to pain. Pt with improved AAROM Lt shoulder external rotation. Pt continues to make progress toward LTG #4 and all other goals; will continue to benefit from skilled PT for improved functional mobility of Lt.  shoulder    Comorbidities  bil RTC repairs; HTN, COVID+ 12/2018, depression, DM, OA    Rehab Potential  Good    PT Frequency  3x / week    PT Duration  12 weeks    PT Treatment/Interventions  ADLs/Self Care Home Management;Cryotherapy;Electrical Stimulation;Ultrasound;Moist Heat;Iontophoresis 91m/ml Dexamethasone;Therapeutic activities;Therapeutic exercise;Patient/family education;Manual techniques;Passive range of motion;Scar mobilization;Vasopneumatic Device;Taping    PT Next Visit Plan  Continue progressing shoulder ROM  and strengthening per protocol and pt tolerance    PT Home Exercise Plan  Access Code: [email or web address]      Patient will benefit from skilled therapeutic intervention in order to improve the following deficits and impairments:  Decreased range of motion, Increased fascial restricitons, Increased muscle spasms, Pain, Postural dysfunction, Decreased strength  Visit Diagnosis: Stiffness of left shoulder, not elsewhere classified  Acute pain of left shoulder  Chronic left shoulder pain  Muscle weakness (generalized)     Problem List Patient Active Problem List   Diagnosis Date Noted  . Thrombocytopenia ([place] 02/03/2019  . Pneumonia due to COVID-19 virus 12/20/2018  . Mass of left thigh 05/24/2018  . History of DVT (deep vein thrombosis) 05/09/2018  . Pain and swelling of left lower extremity 05/09/2018  . Cold sore 04/26/2018  . Primary insomnia 10/30/2017  . S/P cervical spinal fusion 09/28/2017  . Superficial venous thrombosis of right arm 01/05/2017  . Left shoulder pain 01/05/2017  . Depression 05/02/2016  . Ingrown right big toenail 02/16/2016  . Obesity 01/23/2015  . Right shoulder pain 01/23/2015  . Former smoker 08/28/2014  . Nephrolithiasis 07/25/2014  . Diabetes mellitus, type 2 ([place] 07/25/2014  . Essential hypertension, benign 07/25/2014  . Annual physical exam 07/25/2014  . Hyperlipidemia 07/25/2014    KGardiner Rhyme SPTA 05/24/2019, 11:43 AM    JKerin Perna PTA 05/24/19 1:23 PM   [facility] [address] [address]Phone: [phone]  Fax:  [phone] Name: Robert CIANCIMRN: [id number] of Birth: [birth year]

## 2019-05-27 ENCOUNTER — Ambulatory Visit (INDEPENDENT_AMBULATORY_CARE_PROVIDER_SITE_OTHER): Payer: 59 | Admitting: Physical Therapy

## 2019-05-27 ENCOUNTER — Encounter: Payer: Self-pay | Admitting: Physical Therapy

## 2019-05-27 ENCOUNTER — Other Ambulatory Visit: Payer: Self-pay

## 2019-05-27 DIAGNOSIS — M6281 Muscle weakness (generalized): Secondary | ICD-10-CM | POA: Diagnosis not present

## 2019-05-27 DIAGNOSIS — G8929 Other chronic pain: Secondary | ICD-10-CM | POA: Diagnosis not present

## 2019-05-27 DIAGNOSIS — M25612 Stiffness of left shoulder, not elsewhere classified: Secondary | ICD-10-CM | POA: Diagnosis not present

## 2019-05-27 DIAGNOSIS — M25512 Pain in left shoulder: Secondary | ICD-10-CM | POA: Diagnosis not present

## 2019-05-27 NOTE — Therapy (Signed)
[facility] [address] [place], , [address] Phone: [phone]   Fax:  [phone]  Physical Therapy Treatment  Patient Details  Name: Robert Hines MRN: [id number] Date of Birth: 03/16/1956 Referring Provider (PT): Justin Chandler, MD  Physical Therapy Progress Note  Dates of Reporting Period: 04/04/19 to 05/27/19    Encounter Date: 05/27/2019  PT End of Session - 05/27/19 1022    Visit Number  20    Number of Visits  30    Date for PT Re-Evaluation  06/27/19    PT Start Time  1015    PT Stop Time  1110    PT Time Calculation (min)  55 min    Activity Tolerance  Patient tolerated treatment well    Behavior During Therapy  WFL for tasks assessed/performed       Past Medical History:  Diagnosis Date  . Arthritis    neck  . Depression   . Diabetes mellitus without complication ([facility])    type 2  . Hyperlipidemia   . Hypertension   . Pneumonia    covid pneumonia and covid july 2020, d/ced july 16th from [facility] stayed 9 days  . Renal disorder    calculi, multiple lithotripsies, stents  . Right cataract   . Rotator cuff disorder   . Spinal pain     Past Surgical History:  Procedure Laterality Date  . ANTERIOR FUSION CERVICAL SPINE  2007  . ARTHOSCOPIC ROTAOR CUFF REPAIR Left 03/21/2019   Procedure: ARTHROSCOPIC ROTATOR CUFF REPAIR;  Surgeon: Chandler, Justin, MD;  Location: [facility];  Service: Orthopedics;  Laterality: Left;  . EYE SURGERY Left    cataract  . FOOT SURGERY Right    pin placed and removed  . HERNIA REPAIR     inguinal  . LITHOTRIPSY     several  . SHOULDER ARTHROSCOPY WITH ROTATOR CUFF REPAIR AND SUBACROMIAL DECOMPRESSION Right 12/28/2015   Procedure: SHOULDER ARTHROSCOPY TAKE DOWN ROTATOR CUFF REPAIR AND SUBACROMIAL DECOMPRESSION, DEBRIDEMENT OF SLAP TEAR;  Surgeon: Justin Chandler, MD;  Location: [facility];  Service: Orthopedics;   Laterality: Right;  SHOULDER ARTHROSCOPY TAKE DOWN ROTATOR CUFF REPAIR AND SUBACROMIAL DECOMPRESSION  . SHOULDER ARTHROSCOPY WITH SUBACROMIAL DECOMPRESSION Left 03/21/2019   Procedure: SHOULDER ARTHROSCOPY WITH SUBACROMIAL DECOMPRESSION;  Surgeon: Chandler, Justin, MD;  [facility];  Service: Orthopedics;  Laterality: Left;  . VASECTOMY Bilateral     There were no vitals filed for this visit.  Subjective Assessment - 05/27/19 1026    Subjective  Patient reports his shoulder was really hurting over the weekend. Pain is worse at night and waking him from sleeping.    Pertinent History  Rt RTC repair 2017    Patient Stated Goals  regain function/use of Lt shoulder    Currently in Pain?  Yes    Pain Score  1     Pain Location  Shoulder    Pain Orientation  Left    Pain Descriptors / Indicators  Sore    Pain Type  Surgical pain    Pain Onset  More than a month ago    Pain Frequency  Intermittent    Aggravating Factors   sleep, certain movements    Pain Relieving Factors  rest         [facility] PT Assessment - 05/27/19 0001      ROM / Strength   AROM / PROM / Strength  AROM;PROM        AROM   AROM Assessment Site  Shoulder    Right/Left Shoulder  Left    Right Shoulder Internal Rotation  --    Right Shoulder External Rotation  --    Left Shoulder Flexion  120 Degrees    Left Shoulder Internal Rotation  52 Degrees   at 48 deg ABD   Left Shoulder External Rotation  40 Degrees   at 55 deg ABD     PROM   PROM Assessment Site  Shoulder    Right/Left Shoulder  Left    Left Shoulder Flexion  127 Degrees   supine   Left Shoulder Internal Rotation  65 Degrees   at 48 deg ABD   Left Shoulder External Rotation  50 Degrees   at 55 deg ABD                  [facility] Adult PT Treatment/Exercise - 05/27/19 0001      Shoulder Exercises: Standing   Flexion Limitations  AROM wall ladder x 10; then wall slides x 5 into flexion    Other Standing Exercises   pendulum for pain relief      Shoulder Exercises: Pulleys   Flexion  3 minutes    Scaption  3 minutes      Modalities   Modalities  Moist Heat      Moist Heat Therapy   Number Minutes Moist Heat  10 Minutes    Moist Heat Location  Shoulder      Manual Therapy   Manual Therapy  Soft tissue mobilization;Joint mobilization    Joint Mobilization  thoracic PA mobs gd II and III    Soft tissue mobilization  IASTM to periscapular and RC muslces in prone and supine    Passive ROM  into flex, IR, ER               PT Short Term Goals - 05/27/19 1030      PT SHORT TERM GOAL #1   Title  independent with initial HEP    Time  6    Period  Weeks    Status  Achieved      PT SHORT TERM GOAL #2   Title  improve Lt shoulder PROM to limits of protocol (flexion 90 deg; er 20 deg) in preparation to move into next phase of protocol    Time  6    Period  Weeks    Status  Achieved      PT SHORT TERM GOAL #3   Title  report pain < 4/10 with PROM for improved pain and tolerance    Baseline  pain 2-3/10 at night    Status  Achieved        PT Long Term Goals - 05/27/19 1030      PT LONG TERM GOAL #1   Title  independent with advanced HEP    Status  On-going      PT LONG TERM GOAL #2   Title  FOTO score improved to </= 44% limited for improved function    Baseline  50% limited 05/27/19    Period  Weeks    Status  On-going      PT LONG TERM GOAL #3   Title  demonstrate Lt shoulder strength at least 3/5 for improved function    Status  On-going      PT LONG TERM GOAL #4   Title  Lt shoulder AROM improved to within 5 deg of Rt shoulder for flexion,   abduction and external rotation for improved function    Status  On-going      PT LONG TERM GOAL #5   Title  report pain < 2/10 for improved function and mobility    Status  Partially Met            Plan - 05/27/19 1648    Clinical Impression Statement  Patient has met all STGs but is slow to progress with ROM secondary to  pain tolerance. He is mainly limited in internal and external rotation. He tolderated IASTM to periscapular and RC muscles today, but no significant increase in ROM afterwards. Trial of heat after TE today, but pt reports ice helps more.    PT Frequency  3x / week    PT Duration  12 weeks    PT Treatment/Interventions  ADLs/Self Care Home Management;Cryotherapy;Electrical Stimulation;Ultrasound;Moist Heat;Iontophoresis 37m/ml Dexamethasone;Therapeutic activities;Therapeutic exercise;Patient/family education;Manual techniques;Passive range of motion;Scar mobilization;Vasopneumatic Device;Taping    PT Next Visit Plan  Continue progressing with more aggressive shoulder ROM  and strengthening per protocol and pt tolerance    PT Home Exercise Plan  Access Code: [email or web address]   Consulted and Agree with Plan of Care  Patient       Patient will benefit from skilled therapeutic intervention in order to improve the following deficits and impairments:  Decreased range of motion, Increased fascial restricitons, Increased muscle spasms, Pain, Postural dysfunction, Decreased strength  Visit Diagnosis: Stiffness of left shoulder, not elsewhere classified  Muscle weakness (generalized)  Chronic left shoulder pain     Problem List Patient Active Problem List   Diagnosis Date Noted  . Thrombocytopenia ([place] 02/03/2019  . Pneumonia due to COVID-19 virus 12/20/2018  . Mass of left thigh 05/24/2018  . History of DVT (deep vein thrombosis) 05/09/2018  . Pain and swelling of left lower extremity 05/09/2018  . Cold sore 04/26/2018  . Primary insomnia 10/30/2017  . S/P cervical spinal fusion 09/28/2017  . Superficial venous thrombosis of right arm 01/05/2017  . Left shoulder pain 01/05/2017  . Depression 05/02/2016  . Ingrown right big toenail 02/16/2016  . Obesity 01/23/2015  . Right shoulder pain 01/23/2015  . Former smoker 08/28/2014  . Nephrolithiasis 07/25/2014  . Diabetes mellitus, type 2 ([place]  07/25/2014  . Essential hypertension, benign 07/25/2014  . Annual physical exam 07/25/2014  . Hyperlipidemia 07/25/2014    JMadelyn FlavorsPT 05/27/2019, 4:57 PM  [facility] [place] [address]Phone: [phone]  Fax:  [phone] Name: Robert BOLLSMRN: [id number] of Birth: 9Jan 31, 1957

## 2019-05-30 ENCOUNTER — Other Ambulatory Visit: Payer: Self-pay

## 2019-05-30 ENCOUNTER — Ambulatory Visit (INDEPENDENT_AMBULATORY_CARE_PROVIDER_SITE_OTHER): Payer: 59 | Admitting: Physical Therapy

## 2019-05-30 ENCOUNTER — Encounter: Payer: Self-pay | Admitting: Physical Therapy

## 2019-05-30 ENCOUNTER — Encounter: Payer: Self-pay | Admitting: Sports Medicine

## 2019-05-30 DIAGNOSIS — M25512 Pain in left shoulder: Secondary | ICD-10-CM

## 2019-05-30 DIAGNOSIS — M25612 Stiffness of left shoulder, not elsewhere classified: Secondary | ICD-10-CM

## 2019-05-30 DIAGNOSIS — M6281 Muscle weakness (generalized): Secondary | ICD-10-CM

## 2019-05-30 DIAGNOSIS — R293 Abnormal posture: Secondary | ICD-10-CM

## 2019-05-30 DIAGNOSIS — G8929 Other chronic pain: Secondary | ICD-10-CM

## 2019-05-30 NOTE — Therapy (Signed)
[place], [place], [address] Phone: [phone]   Fax:  [phone]  Physical Therapy Treatment  Patient Details  Name: Robert Hines MRN: [id number] Date of Birth: [birth year] Referring Provider (PT): Tania Ade, MD   Encounter Date: 05/30/2019  PT End of Session - 05/30/19 1111    Visit Number  21    Number of Visits  30    Date for PT Re-Evaluation  06/27/19    PT Start Time  1103    PT Stop Time  1145    PT Time Calculation (min)  42 min    Activity Tolerance  Patient tolerated treatment well    Behavior During Therapy  [facility] for tasks assessed/performed       Past Medical History:  Diagnosis Date  . Arthritis    neck  . Depression   . Diabetes mellitus without complication ([place])    type 2  . Hyperlipidemia   . Hypertension   . Pneumonia    covid pneumonia and covid july 2020, d/ced july 16th from [place] stayed 9 days  . Renal disorder    calculi, multiple lithotripsies, stents  . Right cataract   . Rotator cuff disorder   . Spinal pain     Past Surgical History:  Procedure Laterality Date  . ANTERIOR FUSION CERVICAL SPINE  2007  . ARTHOSCOPIC ROTAOR CUFF REPAIR Left 03/21/2019   Procedure: ARTHROSCOPIC ROTATOR CUFF REPAIR;  Surgeon: Tania Ade, MD;  Location: [facility];  Service: Orthopedics;  Laterality: Left;  . EYE SURGERY Left    cataract  . FOOT SURGERY Right    pin placed and removed  . HERNIA REPAIR     inguinal  . LITHOTRIPSY     several  . SHOULDER ARTHROSCOPY WITH ROTATOR CUFF REPAIR AND SUBACROMIAL DECOMPRESSION Right 12/28/2015   Procedure: SHOULDER ARTHROSCOPY TAKE DOWN ROTATOR CUFF REPAIR AND SUBACROMIAL DECOMPRESSION, DEBRIDEMENT OF SLAP TEAR;  Surgeon: Tania Ade, MD;  Location: [place];  Service: Orthopedics;  Laterality: Right;  SHOULDER ARTHROSCOPY TAKE DOWN ROTATOR CUFF REPAIR AND SUBACROMIAL  DECOMPRESSION  . SHOULDER ARTHROSCOPY WITH SUBACROMIAL DECOMPRESSION Left 03/21/2019   Procedure: SHOULDER ARTHROSCOPY WITH SUBACROMIAL DECOMPRESSION;  Surgeon: Tania Ade, MD;  Location: [place];  Service: Orthopedics;  Laterality: Left;  [name] VASECTOMY Bilateral     There were no vitals filed for this visit.  Subjective Assessment - 05/30/19 1109    Subjective  Pt reports he is not being woken up at night as much; no longer taking pain medicine at night.  He still has difficulty stretching arm behind back.    Currently in Pain?  No/denies    Pain Score  0-No pain         [facility] PT Assessment - 05/30/19 0001      Assessment   Medical Diagnosis  RTC repair    Referring Provider (PT)  Tania Ade, MD    Onset Date/Surgical Date  03/21/19    Hand Dominance  Right    Next MD Visit  06/12/19    Prior Therapy  following Rt RTC repair        [facility] Adult PT Treatment/Exercise - 05/30/19 0001      Shoulder Exercises: Prone   Horizontal ABduction 1  Left;10 reps   standing, prone over white bolster, high table height     Shoulder Exercises: Standing   External Rotation  Strengthening;Left;Theraband;15 reps    Theraband  Level (Shoulder External Rotation)  Level 1 (Yellow)    Internal Rotation  Strengthening;Left;15 reps    Theraband Level (Shoulder Internal Rotation)  Level 1 (Yellow)    Flexion Limitations  AROM wall ladder x 5- up to #29; wall slides x 5 reps     Row  Strengthening;Left;10 reps;Theraband    Theraband Level (Shoulder Row)  Level 1 (Yellow)    Other Standing Exercises  pendulum throughout session for pain relief      Shoulder Exercises: Pulleys   Flexion  3 minutes    Scaption  2 minutes      Shoulder Exercises: Stretch   Corner Stretch  3 reps;20 seconds    Internal Rotation Stretch  1 rep   10 sec; limited tolerance   Other Shoulder Stretches  bilat bicep stretch holding door frame x 20 sec x 3 reps       Vasopneumatic   Number  Minutes Vasopneumatic   10 minutes    Vasopnuematic Location   Shoulder    Vasopneumatic Pressure  Low    Vasopneumatic Temperature   34 deg               PT Short Term Goals - 05/27/19 1030      PT SHORT TERM GOAL #1   Title  independent with initial HEP    Time  6    Period  Weeks    Status  Achieved      PT SHORT TERM GOAL #2   Title  improve Lt shoulder PROM to limits of protocol (flexion 90 deg; er 20 deg) in preparation to move into next phase of protocol    Time  6    Period  Weeks    Status  Achieved      PT SHORT TERM GOAL #3   Title  report pain < 4/10 with PROM for improved pain and tolerance    Baseline  pain 2-3/10 at night    Status  Achieved        PT Long Term Goals - 05/27/19 1030      PT LONG TERM GOAL #1   Title  independent with advanced HEP    Status  On-going      PT LONG TERM GOAL #2   Title  FOTO score improved to </= 44% limited for improved function    Baseline  50% limited 05/27/19    Period  Weeks    Status  On-going      PT LONG TERM GOAL #3   Title  demonstrate Lt shoulder strength at least 3/5 for improved function    Status  On-going      PT LONG TERM GOAL #4   Title  Lt shoulder AROM improved to within 5 deg of Rt shoulder for flexion, abduction and external rotation for improved function    Status  On-going      PT LONG TERM GOAL #5   Title  report pain < 2/10 for improved function and mobility    Status  Partially Met            Plan - 05/30/19 1332    Clinical Impression Statement  Continued limit in pt's Lt shoulder ROM; pain continues to be a limiting factor.  Trial of gentle strengthening initiated today without increase in symptoms. Limited range with Prone T's with LUE. Very limited tolerance for Lt IR stretch. Pt prefers ice/vaso at end of session instead of heat.  Pt progressing gradually towards  goals.    PT Frequency  3x / week    PT Duration  12 weeks    PT Treatment/Interventions  ADLs/Self Care Home  Management;Cryotherapy;Electrical Stimulation;Ultrasound;Moist Heat;Iontophoresis 62m/ml Dexamethasone;Therapeutic activities;Therapeutic exercise;Patient/family education;Manual techniques;Passive range of motion;Scar mobilization;Vasopneumatic Device;Taping    PT Next Visit Plan  Continue progressing with more aggressive shoulder ROM  and strengthening per protocol and pt tolerance.  Add band exercises to HEP if tolerated.    PT Home Exercise Plan  Access Code: [email or web address]   Consulted and Agree with Plan of Care  Patient       Patient will benefit from skilled therapeutic intervention in order to improve the following deficits and impairments:  Decreased range of motion, Increased fascial restricitons, Increased muscle spasms, Pain, Postural dysfunction, Decreased strength  Visit Diagnosis: Stiffness of left shoulder, not elsewhere classified  Muscle weakness (generalized)  Chronic left shoulder pain  Acute pain of left shoulder  Abnormal posture     Problem List Patient Active Problem List   Diagnosis Date Noted  . Thrombocytopenia ([place] 02/03/2019  . Pneumonia due to COVID-19 virus 12/20/2018  . Mass of left thigh 05/24/2018  . History of DVT (deep vein thrombosis) 05/09/2018  . Pain and swelling of left lower extremity 05/09/2018  . Cold sore 04/26/2018  . Primary insomnia 10/30/2017  . S/P cervical spinal fusion 09/28/2017  . Superficial venous thrombosis of right arm 01/05/2017  . Left shoulder pain 01/05/2017  . Depression 05/02/2016  . Ingrown right big toenail 02/16/2016  . Obesity 01/23/2015  . Right shoulder pain 01/23/2015  . Former smoker 08/28/2014  . Nephrolithiasis 07/25/2014  . Diabetes mellitus, type 2 ([place] 07/25/2014  . Essential hypertension, benign 07/25/2014  . Annual physical exam 07/25/2014  . Hyperlipidemia 07/25/2014   JKerin Perna PTA 05/30/19 1:38 PM  [facility] [place] [address]Phone: [phone]  Fax:  [phone] Name: Robert SISNEYMRN: [id number] of Birth: [birth year]

## 2019-05-31 ENCOUNTER — Encounter: Payer: Self-pay | Admitting: Rehabilitative and Restorative Service Providers"

## 2019-05-31 ENCOUNTER — Ambulatory Visit (INDEPENDENT_AMBULATORY_CARE_PROVIDER_SITE_OTHER): Payer: 59 | Admitting: Rehabilitative and Restorative Service Providers"

## 2019-05-31 DIAGNOSIS — R293 Abnormal posture: Secondary | ICD-10-CM | POA: Diagnosis not present

## 2019-05-31 DIAGNOSIS — M25612 Stiffness of left shoulder, not elsewhere classified: Secondary | ICD-10-CM

## 2019-05-31 DIAGNOSIS — M25512 Pain in left shoulder: Secondary | ICD-10-CM

## 2019-05-31 DIAGNOSIS — M6281 Muscle weakness (generalized): Secondary | ICD-10-CM | POA: Diagnosis not present

## 2019-05-31 DIAGNOSIS — G8929 Other chronic pain: Secondary | ICD-10-CM

## 2019-05-31 DIAGNOSIS — R29898 Other symptoms and signs involving the musculoskeletal system: Secondary | ICD-10-CM

## 2019-05-31 NOTE — Patient Instructions (Signed)
Access Code: [email or web address]  URL: [email or web address]  Date: 05/31/2019  Prepared by: Rudell Cobb   Program Notes  Before completing strengthening exercises, make sure to stand up tall and pull shoulder blades back and down.   Exercises Circular Shoulder Pendulum with Table Support - 10 reps - 3 sets - 1x daily - 7x weekly Supine Thoracic Mobilization Towel Roll Vertical with Arm Stretch - 2 reps - 1 sets - 1-2 minutes hold - 2x daily - 7x weekly Supine Shoulder External Rotation with Dowel - 10 reps - 1 sets - 3-5 seconds hold - 2x daily - 7x weekly Supine Shoulder Flexion with Dowel - 10 reps - 1 sets - 2x daily - 7x weekly Prone Shoulder Row - 10 reps - 1 sets - 2x daily - 7x weekly Standing Bilateral Shoulder Internal Rotation AAROM with Dowel - 10 reps - 3 sets - 1x daily - 7x weekly Isometric Shoulder Flexion at Wall - 10 reps - 1 sets - 5 seconds hold - 2x daily - 7x weekly Isometric Shoulder Extension at Wall - 10 reps - 1 sets - 5 seconds hold - 2x daily - 7x weekly Standing Isometric Shoulder External Rotation with Doorway - 10 reps - 1 sets - 5 seconds hold - 2x daily - 7x weekly Shoulder Flexion Wall Slide with Towel - 10 reps - 1 sets - 2x daily                            - 7x weekly Isometric Shoulder Extension at Wall - 10 reps - 1 sets - 5 seconds hold - 2x daily - 7x weekly [place] Stretch - 2 reps - 1 sets - 30 seconds hold - 2x daily - 7x weekly

## 2019-05-31 NOTE — Therapy (Signed)
[place], [place], [address] Phone: [phone]   Fax:  [phone]  Physical Therapy Treatment  Patient Details  Name: Robert Hines MRN: [id number] Date of Birth: [birth year] Referring Provider (PT): Tania Ade, MD   Encounter Date: 05/31/2019  PT End of Session - 05/31/19 1441    Visit Number  22    Number of Visits  30    Date for PT Re-Evaluation  06/27/19    PT Start Time  1400    PT Stop Time  1450    PT Time Calculation (min)  50 min    Activity Tolerance  Patient tolerated treatment well    Behavior During Therapy  [facility] for tasks assessed/performed       Past Medical History:  Diagnosis Date  . Arthritis    neck  . Depression   . Diabetes mellitus without complication ([place])    type 2  . Hyperlipidemia   . Hypertension   . Pneumonia    covid pneumonia and covid july 2020, d/ced july 16th from [place] stayed 9 days  . Renal disorder    calculi, multiple lithotripsies, stents  . Right cataract   . Rotator cuff disorder   . Spinal pain     Past Surgical History:  Procedure Laterality Date  . ANTERIOR FUSION CERVICAL SPINE  2007  . ARTHOSCOPIC ROTAOR CUFF REPAIR Left 03/21/2019   Procedure: ARTHROSCOPIC ROTATOR CUFF REPAIR;  Surgeon: Tania Ade, MD;  Location: [facility];  Service: Orthopedics;  Laterality: Left;  . EYE SURGERY Left    cataract  . FOOT SURGERY Right    pin placed and removed  . HERNIA REPAIR     inguinal  . LITHOTRIPSY     several  . SHOULDER ARTHROSCOPY WITH ROTATOR CUFF REPAIR AND SUBACROMIAL DECOMPRESSION Right 12/28/2015   Procedure: SHOULDER ARTHROSCOPY TAKE DOWN ROTATOR CUFF REPAIR AND SUBACROMIAL DECOMPRESSION, DEBRIDEMENT OF SLAP TEAR;  Surgeon: Tania Ade, MD;  Location: [place];  Service: Orthopedics;  Laterality: Right;  SHOULDER ARTHROSCOPY TAKE DOWN ROTATOR CUFF REPAIR AND SUBACROMIAL  DECOMPRESSION  . SHOULDER ARTHROSCOPY WITH SUBACROMIAL DECOMPRESSION Left 03/21/2019   Procedure: SHOULDER ARTHROSCOPY WITH SUBACROMIAL DECOMPRESSION;  Surgeon: Tania Ade, MD;  Location: [place];  Service: Orthopedics;  Laterality: Left;  [name] VASECTOMY Bilateral     There were no vitals filed for this visit.  Subjective Assessment - 05/31/19 1400    Subjective  The patient notes soreness when he lies on the left shoulder at night.    Pertinent History  Rt RTC repair 2017    Patient Stated Goals  regain function/use of Lt shoulder    Currently in Pain?  No/denies    Pain Score  --   sore   Pain Location  Shoulder    Pain Orientation  Left    Pain Descriptors / Indicators  Sore    Pain Type  Surgical pain    Pain Onset  More than a month ago    Pain Frequency  Intermittent    Aggravating Factors   sleep    Pain Relieving Factors  rest         [facility] PT Assessment - 05/31/19 1410      AROM   Left Shoulder Flexion  130 Degrees   supine   Left Shoulder External Rotation  45 Degrees      PROM   Left Shoulder Flexion  135 Degrees  [facility] Adult PT Treatment/Exercise - 05/31/19 1401      Exercises   Exercises  Shoulder      Neck Exercises: Standing   Other Standing Exercises  thoracic extension standing using a towel to increase thoracic extension (pulling anteriorly with UEs)      Shoulder Exercises: Supine   Protraction  Strengthening;Left;5 reps    External Rotation  AROM;Left;10 reps;PROM    External Rotation Limitations  with contract relax for IR/ER t/o ROM    Internal Rotation  AROM;Left;10 reps;PROM    Flexion  10 reps;AROM;PROM    ABduction  AROM;Left;10 reps;PROM      Shoulder Exercises: Prone   Retraction  AROM;Strengthening;Both;10 reps    Retraction Limitations  tactile cues to move from scapular and not elbow    Flexion  AROM;Left;10 reps    Extension  10 reps;Left;Strengthening      Shoulder Exercises:  Standing   Flexion  AROM;Left;Strengthening;10 reps    Flexion Limitations  Performed standing AROM with tactile cues scapular retraction x 10 reps, then 5 reps of shoulder towel slide for flexion *added to HEP    ABduction  AROM;Strengthening;Left;10 reps    ABduction Limitations  scaption x 10 reps with tactile cues for scapular retraction    Retraction  Strengthening;Left;10 reps    Retraction Limitations  cues for retraction before performing any movement >90 degrees    Other Standing Exercises  pendulum as needed for pain relief      Shoulder Exercises: Pulleys   Flexion  3 minutes    Scaption  2 minutes      Modalities   Modalities  Vasopneumatic      Vasopneumatic   Number Minutes Vasopneumatic   10 minutes    Vasopnuematic Location   Shoulder    Vasopneumatic Pressure  Low    Vasopneumatic Temperature   34 deg      Manual Therapy   Manual Therapy  Soft tissue mobilization;Joint mobilization;Scapular mobilization;Passive ROM    Manual therapy comments  in supine and prone    Joint Mobilization  grade I oscillation GH joint to relax during PROM and stretching.    Soft tissue mobilization  pec minor release duirng PROM stretching    Scapular Mobilization  prone PT worked on PROM to AROM for scapulothoracic training and engaging scapular retraction    Passive ROM  into flexion, IR, ER, abduction             PT Education - 05/31/19 1441    Education Details  HEP progression    Person(s) Educated  Patient    Methods  Explanation;Demonstration;Handout    Comprehension  Verbalized understanding;Returned demonstration       PT Short Term Goals - 05/27/19 1030      PT SHORT TERM GOAL #1   Title  independent with initial HEP    Time  6    Period  Weeks    Status  Achieved      PT SHORT TERM GOAL #2   Title  improve Lt shoulder PROM to limits of protocol (flexion 90 deg; er 20 deg) in preparation to move into next phase of protocol    Time  6    Period  Weeks     Status  Achieved      PT SHORT TERM GOAL #3   Title  report pain < 4/10 with PROM for improved pain and tolerance    Baseline  pain 2-3/10 at night    Status  Achieved        PT Long Term Goals - 05/27/19 1030      PT LONG TERM GOAL #1   Title  independent with advanced HEP    Status  On-going      PT LONG TERM GOAL #2   Title  FOTO score improved to </= 44% limited for improved function    Baseline  50% limited 05/27/19    Period  Weeks    Status  On-going      PT LONG TERM GOAL #3   Title  demonstrate Lt shoulder strength at least 3/5 for improved function    Status  On-going      PT LONG TERM GOAL #4   Title  Lt shoulder AROM improved to within 5 deg of Rt shoulder for flexion, abduction and external rotation for improved function    Status  On-going      PT LONG TERM GOAL #5   Title  report pain < 2/10 for improved function and mobility    Status  Partially Met            Plan - 05/31/19 1442    Clinical Impression Statement  The patient is progressing today with ROM with emphasis on scapular strengthening and stabilization before reaching overhead.   His pec minor continues to limit ROM. PT performed prone exercises attempting to isolate scapular stabilizers and needs cues to perform correctly. PT modified HEP adding further progression of ROM and strengthening activities.    PT Treatment/Interventions  ADLs/Self Care Home Management;Cryotherapy;Electrical Stimulation;Ultrasound;Moist Heat;Iontophoresis 16m/ml Dexamethasone;Therapeutic activities;Therapeutic exercise;Patient/family education;Manual techniques;Passive range of motion;Scar mobilization;Vasopneumatic Device;Taping    PT Next Visit Plan  Add more aggressive shoulder ROM and strengthening.  Add band exercises to HEP if tolerated (started with scapular activities prone with significant cues for correct technique).    PT Home Exercise Plan  Access Code: [email or web address]   Consulted and Agree with Plan of Care   Patient       Patient will benefit from skilled therapeutic intervention in order to improve the following deficits and impairments:  Decreased range of motion, Increased fascial restricitons, Increased muscle spasms, Pain, Postural dysfunction, Decreased strength  Visit Diagnosis: Stiffness of left shoulder, not elsewhere classified  Muscle weakness (generalized)  Chronic left shoulder pain  Acute pain of left shoulder  Abnormal posture  Other symptoms and signs involving the musculoskeletal system     Problem List Patient Active Problem List   Diagnosis Date Noted  . Thrombocytopenia ([place] 02/03/2019  . Pneumonia due to COVID-19 virus 12/20/2018  . Mass of left thigh 05/24/2018  . History of DVT (deep vein thrombosis) 05/09/2018  . Pain and swelling of left lower extremity 05/09/2018  . Cold sore 04/26/2018  . Primary insomnia 10/30/2017  . S/P cervical spinal fusion 09/28/2017  . Superficial venous thrombosis of right arm 01/05/2017  . Left shoulder pain 01/05/2017  . Depression 05/02/2016  . Ingrown right big toenail 02/16/2016  . Obesity 01/23/2015  . Right shoulder pain 01/23/2015  . Former smoker 08/28/2014  . Nephrolithiasis 07/25/2014  . Diabetes mellitus, type 2 ([place] 07/25/2014  . Essential hypertension, benign 07/25/2014  . Annual physical exam 07/25/2014  . Hyperlipidemia 07/25/2014    WQulin, PT 05/31/2019, 3:21 PM  [facility] [address]Phone: [phone]  Fax:  [phone] Name: GKUTTER SCHNEPFMRN: [id number] of Birth: 901-26-1957

## 2019-06-03 ENCOUNTER — Encounter: Payer: Self-pay | Admitting: Physical Therapy

## 2019-06-03 ENCOUNTER — Other Ambulatory Visit: Payer: Self-pay

## 2019-06-03 ENCOUNTER — Ambulatory Visit (INDEPENDENT_AMBULATORY_CARE_PROVIDER_SITE_OTHER): Payer: 59 | Admitting: Physical Therapy

## 2019-06-03 DIAGNOSIS — G8929 Other chronic pain: Secondary | ICD-10-CM | POA: Diagnosis not present

## 2019-06-03 DIAGNOSIS — M6281 Muscle weakness (generalized): Secondary | ICD-10-CM

## 2019-06-03 DIAGNOSIS — M25512 Pain in left shoulder: Secondary | ICD-10-CM

## 2019-06-03 DIAGNOSIS — M25612 Stiffness of left shoulder, not elsewhere classified: Secondary | ICD-10-CM

## 2019-06-03 NOTE — Therapy (Signed)
[place], [place], [address] Phone: [phone]   Fax:  [phone]  Physical Therapy Treatment  Patient Details  Name: Robert Hines MRN: [id number] Date of Birth: [birth year] Referring Provider (PT): Tania Ade, MD   Encounter Date: 06/03/2019  PT End of Session - 06/03/19 1021    Visit Number  23    Number of Visits  30    Date for PT Re-Evaluation  06/27/19    PT Start Time  6433    PT Stop Time  1103    PT Time Calculation (min)  45 min    Activity Tolerance  Patient tolerated treatment well    Behavior During Therapy  [facility] for tasks assessed/performed       Past Medical History:  Diagnosis Date  . Arthritis    neck  . Depression   . Diabetes mellitus without complication ([place])    type 2  . Hyperlipidemia   . Hypertension   . Pneumonia    covid pneumonia and covid july 2020, d/ced july 16th from [place] stayed 9 days  . Renal disorder    calculi, multiple lithotripsies, stents  . Right cataract   . Rotator cuff disorder   . Spinal pain     Past Surgical History:  Procedure Laterality Date  . ANTERIOR FUSION CERVICAL SPINE  2007  . ARTHOSCOPIC ROTAOR CUFF REPAIR Left 03/21/2019   Procedure: ARTHROSCOPIC ROTATOR CUFF REPAIR;  Surgeon: Tania Ade, MD;  Location: [facility];  Service: Orthopedics;  Laterality: Left;  . EYE SURGERY Left    cataract  . FOOT SURGERY Right    pin placed and removed  . HERNIA REPAIR     inguinal  . LITHOTRIPSY     several  . SHOULDER ARTHROSCOPY WITH ROTATOR CUFF REPAIR AND SUBACROMIAL DECOMPRESSION Right 12/28/2015   Procedure: SHOULDER ARTHROSCOPY TAKE DOWN ROTATOR CUFF REPAIR AND SUBACROMIAL DECOMPRESSION, DEBRIDEMENT OF SLAP TEAR;  Surgeon: Tania Ade, MD;  Location: [place];  Service: Orthopedics;  Laterality: Right;  SHOULDER ARTHROSCOPY TAKE DOWN ROTATOR CUFF REPAIR AND SUBACROMIAL  DECOMPRESSION  . SHOULDER ARTHROSCOPY WITH SUBACROMIAL DECOMPRESSION Left 03/21/2019   Procedure: SHOULDER ARTHROSCOPY WITH SUBACROMIAL DECOMPRESSION;  Surgeon: Tania Ade, MD;  Location: [place];  Service: Orthopedics;  Laterality: Left;  [name] VASECTOMY Bilateral     There were no vitals filed for this visit.  Subjective Assessment - 06/03/19 1022    Subjective  Patient says today won't be as good as last week because it's cold and rainy.    Pertinent History  Rt RTC repair 2017    Limitations  Lifting;Writing;House hold activities    Patient Stated Goals  regain function/use of Lt shoulder    Currently in Pain?  No/denies                       [facility] Adult PT Treatment/Exercise - 06/03/19 0001      Shoulder Exercises: Supine   External Rotation  AROM;Left;10 reps;PROM    External Rotation Limitations  with contract relax for IR/ER t/o ROM    Internal Rotation  AROM;Left;10 reps;PROM    Flexion  AROM;PROM;15 reps    Flexion Limitations  while over noodle for thoracic extension      Shoulder Exercises: Prone   Retraction  AROM;Strengthening;Both;10 reps    Flexion  AROM;Left;10 reps      Shoulder Exercises: Standing   Flexion  AROM;Left;Strengthening;10  reps    Flexion Limitations  Performed standing AROM with tactile cues scapular retraction x 10 reps, then 5 reps of shoulder towel slide for flexion *added to HEP    ABduction  AROM;Strengthening;Left;10 reps    ABduction Limitations  scaption x 10 reps with tactile cues for scapular retraction    Retraction  Strengthening;Left;10 reps    Other Standing Exercises  pendulum as needed for pain relief      Shoulder Exercises: Pulleys   Flexion  3 minutes    Scaption  2 minutes      Shoulder Exercises: Stretch   Other Shoulder Stretches  standing with bil hands on top of doorway x 20 sec      Modalities   Modalities  Vasopneumatic      Vasopneumatic   Number Minutes Vasopneumatic   10  minutes    Vasopnuematic Location   Shoulder    Vasopneumatic Pressure  Low    Vasopneumatic Temperature   34 deg      Manual Therapy   Manual Therapy  Joint mobilization;Passive ROM    Joint Mobilization  grade I oscillation GH joint to relax during PROM and stretching.    Passive ROM  into flexion, IR, ER, abduction               PT Short Term Goals - 05/27/19 1030      PT SHORT TERM GOAL #1   Title  independent with initial HEP    Time  6    Period  Weeks    Status  Achieved      PT SHORT TERM GOAL #2   Title  improve Lt shoulder PROM to limits of protocol (flexion 90 deg; er 20 deg) in preparation to move into next phase of protocol    Time  6    Period  Weeks    Status  Achieved      PT SHORT TERM GOAL #3   Title  report pain < 4/10 with PROM for improved pain and tolerance    Baseline  pain 2-3/10 at night    Status  Achieved        PT Long Term Goals - 05/27/19 1030      PT LONG TERM GOAL #1   Title  independent with advanced HEP    Status  On-going      PT LONG TERM GOAL #2   Title  FOTO score improved to </= 44% limited for improved function    Baseline  50% limited 05/27/19    Period  Weeks    Status  On-going      PT LONG TERM GOAL #3   Title  demonstrate Lt shoulder strength at least 3/5 for improved function    Status  On-going      PT LONG TERM GOAL #4   Title  Lt shoulder AROM improved to within 5 deg of Rt shoulder for flexion, abduction and external rotation for improved function    Status  On-going      PT LONG TERM GOAL #5   Title  report pain < 2/10 for improved function and mobility    Status  Partially Met            Plan - 06/03/19 1059    Clinical Impression Statement  Patient reports increased stiffness today due to weather, but was within one degree of active ER at end of treatment. Self limted by pain in all directions.    Comorbidities  bil RTC repairs; HTN, COVID+ 12/2018, depression, DM, OA    PT  Treatment/Interventions  ADLs/Self Care Home Management;Cryotherapy;Electrical Stimulation;Ultrasound;Moist Heat;Iontophoresis 83m/ml Dexamethasone;Therapeutic activities;Therapeutic exercise;Patient/family education;Manual techniques;Passive range of motion;Scar mobilization;Vasopneumatic Device;Taping    PT Next Visit Plan  Add more aggressive shoulder ROM and strengthening.  Add band exercises to HEP if tolerated (started with scapular activities prone with significant cues for correct technique).    PT Home Exercise Plan  Access Code: [email or web address]      Patient will benefit from skilled therapeutic intervention in order to improve the following deficits and impairments:  Decreased range of motion, Increased fascial restricitons, Increased muscle spasms, Pain, Postural dysfunction, Decreased strength  Visit Diagnosis: Stiffness of left shoulder, not elsewhere classified  Muscle weakness (generalized)  Chronic left shoulder pain     Problem List Patient Active Problem List   Diagnosis Date Noted  . [place]ocyt[place] ([place] 02/03/2019  . Pneumonia due to COVID-19 virus 12/20/2018  . Mass of left thigh 05/24/2018  . History of DVT (deep vein thrombosis) 05/09/2018  . Pain and swelling of left lower extremity 05/09/2018  . Cold sore 04/26/2018  . Primary insomnia 10/30/2017  . S/P cervical spinal fusion 09/28/2017  . Superficial venous thrombosis of right arm 01/05/2017  . Left shoulder pain 01/05/2017  . Depression 05/02/2016  . Ingrown right big toenail 02/16/2016  . Obesity 01/23/2015  . Right shoulder pain 01/23/2015  . Former smoker 08/28/2014  . Nephrolithiasis 07/25/2014  . Diabetes mellitus, type 2 ([place] 07/25/2014  . Essential hypertension, benign 07/25/2014  . Annual physical exam 07/25/2014  . Hyperlipidemia 07/25/2014    JMadelyn FlavorsPT 06/03/2019, 5:30 PM  [facility]  [address]Phone: [phone]  Fax:  [phone] Name: GOLEG OLESONMRN: [id number] of Birth: 904-20-1957

## 2019-06-06 ENCOUNTER — Other Ambulatory Visit: Payer: Self-pay

## 2019-06-06 ENCOUNTER — Encounter: Payer: Self-pay | Admitting: Physical Therapy

## 2019-06-06 ENCOUNTER — Ambulatory Visit (INDEPENDENT_AMBULATORY_CARE_PROVIDER_SITE_OTHER): Payer: 59 | Admitting: Physical Therapy

## 2019-06-06 DIAGNOSIS — M25612 Stiffness of left shoulder, not elsewhere classified: Secondary | ICD-10-CM

## 2019-06-06 DIAGNOSIS — R293 Abnormal posture: Secondary | ICD-10-CM | POA: Diagnosis not present

## 2019-06-06 DIAGNOSIS — M6281 Muscle weakness (generalized): Secondary | ICD-10-CM

## 2019-06-06 DIAGNOSIS — G8929 Other chronic pain: Secondary | ICD-10-CM

## 2019-06-06 DIAGNOSIS — M25512 Pain in left shoulder: Secondary | ICD-10-CM

## 2019-06-06 NOTE — Therapy (Signed)
[place], [place], [address] Phone: [phone]   Fax:  [phone]  Physical Therapy Treatment  Patient Details  Name: Robert Hines MRN: [id number] Date of Birth: Sep 03, 1955 Referring Provider (PT): Tania Ade, MD   Encounter Date: 06/06/2019  PT End of Session - 06/06/19 1017    Visit Number  24    Number of Visits  30    Date for PT Re-Evaluation  06/27/19    PT Start Time  0930    PT Stop Time  1015    PT Time Calculation (min)  45 min    Activity Tolerance  Patient tolerated treatment well    Behavior During Therapy  [facility] for tasks assessed/performed       Past Medical History:  Diagnosis Date  . Arthritis    neck  . Depression   . Diabetes mellitus without complication ([place])    type 2  . Hyperlipidemia   . Hypertension   . Pneumonia    covid pneumonia and covid july 2020, d/ced july 16th from [place] stayed 9 days  . Renal disorder    calculi, multiple lithotripsies, stents  . Right cataract   . Rotator cuff disorder   . Spinal pain     Past Surgical History:  Procedure Laterality Date  . ANTERIOR FUSION CERVICAL SPINE  2007  . ARTHOSCOPIC ROTAOR CUFF REPAIR Left 03/21/2019   Procedure: ARTHROSCOPIC ROTATOR CUFF REPAIR;  Surgeon: Tania Ade, MD;  Location[facility];  Service: Orthopedics;  Laterality: Left;  . EYE SURGERY Left    cataract  . FOOT SURGERY Right    pin placed and removed  . HERNIA REPAIR     inguinal  . LITHOTRIPSY     several  . SHOULDER ARTHROSCOPY WITH ROTATOR CUFF REPAIR AND SUBACROMIAL DECOMPRESSION Right 12/28/2015   Procedure: SHOULDER ARTHROSCOPY TAKE DOWN ROTATOR CUFF REPAIR AND SUBACROMIAL DECOMPRESSION, DEBRIDEMENT OF SLAP TEAR;  Surgeon: Tania Ade, MD;  Location: [place];  Service: Orthopedics;  Laterality: Right;  SHOULDER ARTHROSCOPY TAKE DOWN ROTATOR CUFF REPAIR AND SUBACROMIAL  DECOMPRESSION  . SHOULDER ARTHROSCOPY WITH SUBACROMIAL DECOMPRESSION Left 03/21/2019   Procedure: SHOULDER ARTHROSCOPY WITH SUBACROMIAL DECOMPRESSION;  Surgeon: Tania Ade, MD;  Location: [place];  Service: Orthopedics;  Laterality: Left;  [name] VASECTOMY Bilateral     There were no vitals filed for this visit.  Subjective Assessment - 06/06/19 0945    Subjective  Pt reports his Lt shoulder has had more pain since the cold/rain came in 2 days ago.  He is taking pain medicine and still waking in the night.   "I'm not sure how much I'll be able to do today".    Patient Stated Goals  regain function/use of Lt shoulder    Currently in Pain?  Yes    Pain Score  4     Pain Location  Shoulder    Pain Orientation  Left;Proximal;Anterior    Pain Descriptors / Indicators  Sore    Aggravating Factors   sleep position, IR behind back    Pain Relieving Factors  ice, medication         [facility] PT Assessment - 06/06/19 0001      Assessment   Medical Diagnosis  RTC repair    Referring Provider (PT)  Tania Ade, MD    Onset Date/Surgical Date  03/21/19    Hand Dominance  Right    Next MD Visit  06/12/19    Prior Therapy  following Rt RTC repair      PROM   Left Shoulder Flexion  130 Degrees   supine with AAROM    Left Shoulder External Rotation  55 Degrees   at 55 deg ABD       [facility] Adult PT Treatment/Exercise - 06/06/19 0001      Shoulder Exercises: Supine   External Rotation  Left;10 reps;AAROM   with cane   Flexion  AAROM;Both;10 reps   5 sec hold, with cane   ABduction  Left;10 reps;AAROM   cane, to tolerance     Shoulder Exercises: Standing   Extension  AAROM;Both;10 reps   5 sec hold     Shoulder Exercises: Pulleys   Flexion  3 minutes    Scaption  2 minutes      Vasopneumatic   Number Minutes Vasopneumatic   10 minutes    Vasopnuematic Location   Shoulder    Vasopneumatic Pressure  Low    Vasopneumatic Temperature   34 deg      Manual  Therapy   Manual therapy comments  I strip of reg [place] tape over Lt ant incisions, piece over lateral incisions - with 15% stretch to decrease sensitivity, improve proprioception, decompress tissue    Joint Mobilization  grade 1-2 to [facility]     Soft tissue mobilization  IASTM to Lt bicep, ant and lateral left shoulder to decrease fascial restrictions and improve ROM, STM (including TRP and cross fiber friction to Lt levator, upper trap, rhomboid, and pec at sternal attachments.     Myofascial Release  MFR to pec major/minor and Lt bicep     Scapular Mobilization  pt supine; Lt shoulder in all directions.                PT Short Term Goals - 05/27/19 1030      PT SHORT TERM GOAL #1   Title  independent with initial HEP    Time  6    Period  Weeks    Status  Achieved      PT SHORT TERM GOAL #2   Title  improve Lt shoulder PROM to limits of protocol (flexion 90 deg; er 20 deg) in preparation to move into next phase of protocol    Time  6    Period  Weeks    Status  Achieved      PT SHORT TERM GOAL #3   Title  report pain < 4/10 with PROM for improved pain and tolerance    Baseline  pain 2-3/10 at night    Status  Achieved        PT Long Term Goals - 05/27/19 1030      PT LONG TERM GOAL #1   Title  independent with advanced HEP    Status  On-going      PT LONG TERM GOAL #2   Title  FOTO score improved to </= 44% limited for improved function    Baseline  50% limited 05/27/19    Period  Weeks    Status  On-going      PT LONG TERM GOAL #3   Title  demonstrate Lt shoulder strength at least 3/5 for improved function    Status  On-going      PT LONG TERM GOAL #4   Title  Lt shoulder AROM improved to within 5 deg of Rt shoulder for flexion, abduction and external rotation for improved function  Status  On-going      PT LONG TERM GOAL #5   Title  report pain < 2/10 for improved function and mobility    Status  Partially Met            Plan - 06/06/19 1008     Clinical Impression Statement  Pt reporting continued pain and stiffness in Lt shoulder due to weather changes.  Pt's AARROM in Lt shoulder flexion similar to last measurement and ER improved by 3-5 deg.  Palpable tightness/ tenderness throughout Lt periscapular musculature and pec; improved with STM/MFR.  Pain continues to be limiting factor.  Pt making gradual progress towards goals.    Comorbidities  bil RTC repairs; HTN, COVID+ 12/2018, depression, DM, OA    Rehab Potential  Good    PT Frequency  3x / week    PT Duration  12 weeks    PT Treatment/Interventions  ADLs/Self Care Home Management;Cryotherapy;Electrical Stimulation;Ultrasound;Moist Heat;Iontophoresis 66m/ml Dexamethasone;Therapeutic activities;Therapeutic exercise;Patient/family education;Manual techniques;Passive range of motion;Scar mobilization;Vasopneumatic Device;Taping    PT Next Visit Plan  Add more aggressive shoulder ROM and strengthening.  Add band exercises to HEP if tolerated (started with scapular activities prone with significant cues for correct technique).    PT Home Exercise Plan  Access Code: [email or web address]      Patient will benefit from skilled therapeutic intervention in order to improve the following deficits and impairments:  Decreased range of motion, Increased fascial restricitons, Increased muscle spasms, Pain, Postural dysfunction, Decreased strength  Visit Diagnosis: Stiffness of left shoulder, not elsewhere classified  Muscle weakness (generalized)  Chronic left shoulder pain  Acute pain of left shoulder  Abnormal posture     Problem List Patient Active Problem List   Diagnosis Date Noted  . Thrombocytopenia ([place] 02/03/2019  . Pneumonia due to COVID-19 virus 12/20/2018  . Mass of left thigh 05/24/2018  . History of DVT (deep vein thrombosis) 05/09/2018  . Pain and swelling of left lower extremity 05/09/2018  . Cold sore 04/26/2018  . Primary insomnia 10/30/2017  . S/P cervical spinal  fusion 09/28/2017  . Superficial venous thrombosis of right arm 01/05/2017  . Left shoulder pain 01/05/2017  . Depression 05/02/2016  . Ingrown right big toenail 02/16/2016  . Obesity 01/23/2015  . Right shoulder pain 01/23/2015  . Former smoker 08/28/2014  . Nephrolithiasis 07/25/2014  . Diabetes mellitus, type 2 ([place] 07/25/2014  . Essential hypertension, benign 07/25/2014  . Annual physical exam 07/25/2014  . Hyperlipidemia 07/25/2014    JKerin Perna PTA 06/06/19 12:30 PM  [place] [address]Phone: [phone]  Fax:  [phone] Name: Robert LAMAYMRN: [id number] of Birth: [birth year]

## 2019-06-07 ENCOUNTER — Encounter: Payer: Self-pay | Admitting: Rehabilitative and Restorative Service Providers"

## 2019-06-07 ENCOUNTER — Ambulatory Visit (INDEPENDENT_AMBULATORY_CARE_PROVIDER_SITE_OTHER): Payer: 59 | Admitting: Rehabilitative and Restorative Service Providers"

## 2019-06-07 DIAGNOSIS — R293 Abnormal posture: Secondary | ICD-10-CM | POA: Diagnosis not present

## 2019-06-07 DIAGNOSIS — M25512 Pain in left shoulder: Secondary | ICD-10-CM

## 2019-06-07 DIAGNOSIS — G8929 Other chronic pain: Secondary | ICD-10-CM

## 2019-06-07 DIAGNOSIS — M25612 Stiffness of left shoulder, not elsewhere classified: Secondary | ICD-10-CM

## 2019-06-07 DIAGNOSIS — M6281 Muscle weakness (generalized): Secondary | ICD-10-CM

## 2019-06-07 DIAGNOSIS — R29898 Other symptoms and signs involving the musculoskeletal system: Secondary | ICD-10-CM

## 2019-06-07 NOTE — Therapy (Signed)
[place], [place], [address] Phone: [phone]   Fax:  [phone]  Physical Therapy Treatment  Patient Details  Name: Robert Hines MRN: [id number] Date of Birth: 02/10/1956 Referring Provider (PT): Tania Ade, MD   Encounter Date: 06/07/2019  PT End of Session - 06/07/19 1022    Visit Number  25    Number of Visits  30    Date for PT Re-Evaluation  06/27/19    PT Start Time  6468    PT Stop Time  1110    PT Time Calculation (min)  52 min    Activity Tolerance  Patient tolerated treatment well    Behavior During Therapy  [facility] for tasks assessed/performed       Past Medical History:  Diagnosis Date  . Arthritis    neck  . Depression   . Diabetes mellitus without complication ([place])    type 2  . Hyperlipidemia   . Hypertension   . Pneumonia    covid pneumonia and covid july 2020, d/ced july 16th from [place] stayed 9 days  . Renal disorder    calculi, multiple lithotripsies, stents  . Right cataract   . Rotator cuff disorder   . Spinal pain     Past Surgical History:  Procedure Laterality Date  . ANTERIOR FUSION CERVICAL SPINE  2007  . ARTHOSCOPIC ROTAOR CUFF REPAIR Left 03/21/2019   Procedure: ARTHROSCOPIC ROTATOR CUFF REPAIR;  Surgeon: Tania Ade, MD;  Location: [facility];  Service: Orthopedics;  Laterality: Left;  . EYE SURGERY Left    cataract  . FOOT SURGERY Right    pin placed and removed  . HERNIA REPAIR     inguinal  . LITHOTRIPSY     several  . SHOULDER ARTHROSCOPY WITH ROTATOR CUFF REPAIR AND SUBACROMIAL DECOMPRESSION Right 12/28/2015   Procedure: SHOULDER ARTHROSCOPY TAKE DOWN ROTATOR CUFF REPAIR AND SUBACROMIAL DECOMPRESSION, DEBRIDEMENT OF SLAP TEAR;  Surgeon: Tania Ade, MD;  Location: [facility];  Service: Orthopedics;  Laterality: Right;  SHOULDER ARTHROSCOPY TAKE DOWN ROTATOR CUFF REPAIR AND SUBACROMIAL  DECOMPRESSION  . SHOULDER ARTHROSCOPY WITH SUBACROMIAL DECOMPRESSION Left 03/21/2019   Procedure: SHOULDER ARTHROSCOPY WITH SUBACROMIAL DECOMPRESSION;  Surgeon: Tania Ade, MD;  Location: [place];  Service: Orthopedics;  Laterality: Left;  [name] VASECTOMY Bilateral     There were no vitals filed for this visit.  Subjective Assessment - 06/07/19 1021    Subjective  The patient reports pain is less today at 1/10.  He notes he took a muscle relaxer last night and slept well.  He also used his wife's massager to reduce muscle pain.    Pertinent History  Rt RTC repair 2017    Limitations  Lifting;Writing;House hold activities    Patient Stated Goals  regain function/use of Lt shoulder    Currently in Pain?  Yes    Pain Score  1     Pain Location  Shoulder    Pain Orientation  Left;Anterior;Proximal    Pain Descriptors / Indicators  Sore    Pain Type  Surgical pain    Pain Onset  More than a month ago    Pain Frequency  Intermittent    Aggravating Factors   IR behind back    Pain Relieving Factors  ice, medication         [facility] PT Assessment - 06/07/19 1033      AROM   Right/Left Shoulder  Left    Left Shoulder Flexion  143 Degrees   after subscapularis release (135 before) in supine; 115 stdg     PROM   Left Shoulder Flexion  148 Degrees    Left Shoulder External Rotation  48 Degrees                   [facility] Adult PT Treatment/Exercise - 06/07/19 1026      Exercises   Exercises  Shoulder      Shoulder Exercises: Standing   External Rotation  Strengthening;Left    Theraband Level (Shoulder External Rotation)  Level 2 (Red)    Flexion  Strengthening;Left;10 reps    Theraband Level (Shoulder Flexion)  Level 2 (Red)    Extension  Strengthening;Left;10 reps    Theraband Level (Shoulder Extension)  Level 2 (Red)    Other Standing Exercises  Also performed standing AROM for flexion (bilaterally to compare ROM), abduction, and reaching behind his  head.      Shoulder Exercises: Pulleys   Flexion  2 minutes    Scaption  2 minutes      Vasopneumatic   Number Minutes Vasopneumatic   10 minutes    Vasopnuematic Location   Shoulder    Vasopneumatic Pressure  Low    Vasopneumatic Temperature   34 deg      Manual Therapy   Manual Therapy  Joint mobilization;Soft tissue mobilization;Myofascial release;Passive ROM    Manual therapy comments  in supine and sidelying    Joint Mobilization  supine grade I oscillations in between PROM for muscle relaxation and inhibiting tone    Soft tissue mobilization  STM to L pectoralis, L upper trap and L subscapularis    Myofascial Release  MFR to pec minor    Scapular Mobilization  sidelying scapular diagonals with passive overpressure    Passive ROM  passive stretching flexion, scaption, abduction, and ER L shoulder             PT Education - 06/07/19 1102    Education Details  HEP progressed to add theraband    Person(s) Educated  Patient    Methods  Explanation;Demonstration;Handout    Comprehension  Verbalized understanding;Returned demonstration       PT Short Term Goals - 05/27/19 1030      PT SHORT TERM GOAL #1   Title  independent with initial HEP    Time  6    Period  Weeks    Status  Achieved      PT SHORT TERM GOAL #2   Title  improve Lt shoulder PROM to limits of protocol (flexion 90 deg; er 20 deg) in preparation to move into next phase of protocol    Time  6    Period  Weeks    Status  Achieved      PT SHORT TERM GOAL #3   Title  report pain < 4/10 with PROM for improved pain and tolerance    Baseline  pain 2-3/10 at night    Status  Achieved        PT Long Term Goals - 06/07/19 1216      PT LONG TERM GOAL #1   Title  independent with advanced HEP    Status  On-going    Target Date  06/27/19      PT LONG TERM GOAL #2   Title  FOTO score improved to </= 44% limited for improved function    Baseline  50% limited 05/27/19  Period  Weeks    Status   On-going      PT LONG TERM GOAL #3   Title  demonstrate Lt shoulder strength at least 3/5 for improved function    Status  On-going      PT LONG TERM GOAL #4   Title  Lt shoulder AROM improved to within 5 deg of Rt shoulder for flexion, abduction and external rotation for improved function    Status  On-going      PT LONG TERM GOAL #5   Title  report pain < 2/10 for improved function and mobility    Status  Partially Met            Plan - 06/07/19 1221    Clinical Impression Statement  The patient had improved ROM today compared to yesterday with dec'd pain upon arriving.  He continues with weakness and PT progressed HEP to theraband resistance with cues on scapular stabilization.  Plan to continue workign to LTGs.    PT Treatment/Interventions  ADLs/Self Care Home Management;Cryotherapy;Electrical Stimulation;Ultrasound;Moist Heat;Iontophoresis 16m/ml Dexamethasone;Therapeutic activities;Therapeutic exercise;Patient/family education;Manual techniques;Passive range of motion;Scar mobilization;Vasopneumatic Device;Taping    PT Next Visit Plan  Continue shoulder ROM and strengthening, continue prone scapular stabilization, functional overhead activities and strengthening as mechanics allow.    PT Home Exercise Plan  Access Code: [email or web address]   Consulted and Agree with Plan of Care  Patient       Patient will benefit from skilled therapeutic intervention in order to improve the following deficits and impairments:  Decreased range of motion, Increased fascial restricitons, Increased muscle spasms, Pain, Postural dysfunction, Decreased strength  Visit Diagnosis: Stiffness of left shoulder, not elsewhere classified  Muscle weakness (generalized)  Chronic left shoulder pain  Acute pain of left shoulder  Abnormal posture  Other symptoms and signs involving the musculoskeletal system     Problem List Patient Active Problem List   Diagnosis Date Noted  . Thrombocytopenia ([place]  02/03/2019  . Pneumonia due to COVID-19 virus 12/20/2018  . Mass of left thigh 05/24/2018  . History of DVT (deep vein thrombosis) 05/09/2018  . Pain and swelling of left lower extremity 05/09/2018  . Cold sore 04/26/2018  . Primary insomnia 10/30/2017  . S/P cervical spinal fusion 09/28/2017  . Superficial venous thrombosis of right arm 01/05/2017  . Left shoulder pain 01/05/2017  . Depression 05/02/2016  . Ingrown right big toenail 02/16/2016  . Obesity 01/23/2015  . Right shoulder pain 01/23/2015  . Former smoker 08/28/2014  . Nephrolithiasis 07/25/2014  . Diabetes mellitus, type 2 ([place] 07/25/2014  . Essential hypertension, benign 07/25/2014  . Annual physical exam 07/25/2014  . Hyperlipidemia 07/25/2014    Jashae Wiggs , PT 06/07/2019, 12:25 PM  [facility] [address]Phone: [phone]  Fax:  [phone] Name: GJANSEN SCIUTOMRN: [id number] of Birth: [birth year]

## 2019-06-08 ENCOUNTER — Other Ambulatory Visit: Payer: Self-pay | Admitting: Sports Medicine

## 2019-06-08 DIAGNOSIS — Z794 Long term (current) use of insulin: Secondary | ICD-10-CM

## 2019-06-08 DIAGNOSIS — E119 Type 2 diabetes mellitus without complications: Secondary | ICD-10-CM

## 2019-06-10 ENCOUNTER — Ambulatory Visit (INDEPENDENT_AMBULATORY_CARE_PROVIDER_SITE_OTHER): Payer: 59 | Admitting: Rehabilitative and Restorative Service Providers"

## 2019-06-10 ENCOUNTER — Encounter: Payer: Self-pay | Admitting: Rehabilitative and Restorative Service Providers"

## 2019-06-10 ENCOUNTER — Other Ambulatory Visit: Payer: Self-pay

## 2019-06-10 DIAGNOSIS — R293 Abnormal posture: Secondary | ICD-10-CM

## 2019-06-10 DIAGNOSIS — M25612 Stiffness of left shoulder, not elsewhere classified: Secondary | ICD-10-CM | POA: Diagnosis not present

## 2019-06-10 DIAGNOSIS — M6281 Muscle weakness (generalized): Secondary | ICD-10-CM | POA: Diagnosis not present

## 2019-06-10 DIAGNOSIS — M25512 Pain in left shoulder: Secondary | ICD-10-CM | POA: Diagnosis not present

## 2019-06-10 DIAGNOSIS — G8929 Other chronic pain: Secondary | ICD-10-CM

## 2019-06-10 DIAGNOSIS — R29898 Other symptoms and signs involving the musculoskeletal system: Secondary | ICD-10-CM

## 2019-06-10 NOTE — Therapy (Signed)
[place], [place], [address] Phone: [phone]   Fax:  [phone]  Physical Therapy Treatment  Patient Details  Name: Robert Hines MRN: [id number] Date of Birth: 24-Nov-1955 Referring Provider (PT): Tania Ade, MD   Encounter Date: 06/10/2019  PT End of Session - 06/10/19 1023    Visit Number  26    Number of Visits  30    Date for PT Re-Evaluation  06/27/19    PT Start Time  6629    PT Stop Time  1100    PT Time Calculation (min)  45 min    Activity Tolerance  Patient tolerated treatment well    Behavior During Therapy  [facility] for tasks assessed/performed       Past Medical History:  Diagnosis Date  . Arthritis    neck  . Depression   . Diabetes mellitus without complication ([place])    type 2  . Hyperlipidemia   . Hypertension   . Pneumonia    covid pneumonia and covid july 2020, d/ced july 16th from [place] stayed 9 days  . Renal disorder    calculi, multiple lithotripsies, stents  . Right cataract   . Rotator cuff disorder   . Spinal pain     Past Surgical History:  Procedure Laterality Date  . ANTERIOR FUSION CERVICAL SPINE  2007  . ARTHOSCOPIC ROTAOR CUFF REPAIR Left 03/21/2019   Procedure: ARTHROSCOPIC ROTATOR CUFF REPAIR;  Surgeon: Tania Ade, MD;  Location[facility];  Service: Orthopedics;  Laterality: Left;  . EYE SURGERY Left    cataract  . FOOT SURGERY Right    pin placed and removed  . HERNIA REPAIR     inguinal  . LITHOTRIPSY     several  . SHOULDER ARTHROSCOPY WITH ROTATOR CUFF REPAIR AND SUBACROMIAL DECOMPRESSION Right 12/28/2015   Procedure: SHOULDER ARTHROSCOPY TAKE DOWN ROTATOR CUFF REPAIR AND SUBACROMIAL DECOMPRESSION, DEBRIDEMENT OF SLAP TEAR;  Surgeon: Tania Ade, MD;  Location: [place];  Service: Orthopedics;  Laterality: Right;  SHOULDER ARTHROSCOPY TAKE DOWN ROTATOR CUFF REPAIR AND SUBACROMIAL  DECOMPRESSION  . SHOULDER ARTHROSCOPY WITH SUBACROMIAL DECOMPRESSION Left 03/21/2019   Procedure: SHOULDER ARTHROSCOPY WITH SUBACROMIAL DECOMPRESSION;  Surgeon: Tania Ade, MD;  Location: [place];  Service: Orthopedics;  Laterality: Left;  [name] VASECTOMY Bilateral     There were no vitals filed for this visit.  Subjective Assessment - 06/10/19 1017    Subjective  No pain at rest, pain with movement.  The patient is performing HEP regularly and feels movement is slowly improving.    Pertinent History  Rt RTC repair 2017    Patient Stated Goals  regain function/use of Lt shoulder    Currently in Pain?  Yes    Pain Score  0-No pain   none at rest, pain with movement   Pain Location  Shoulder    Pain Orientation  Left;Anterior;Proximal    Pain Descriptors / Indicators  Sore    Pain Type  Surgical pain    Pain Onset  More than a month ago    Pain Frequency  Intermittent    Aggravating Factors   pain with movement overhead, IR    Pain Relieving Factors  ice, medication         [facility] PT Assessment - 06/10/19 1036      Assessment   Medical Diagnosis  RTC repair    Referring Provider (PT)  Tania Ade, MD  Onset Date/Surgical Date  03/21/19    Hand Dominance  Right    Next MD Visit  06/12/19    Prior Therapy  following Rt RTC repair      ROM / Strength   AROM / PROM / Strength  AROM      AROM   Right/Left Shoulder  Left    Left Shoulder Flexion  122 Degrees   in standing   Left Shoulder ABduction  80 Degrees   in standing with shoulder elevation     PROM   Overall PROM Comments  measured in supine    Left Shoulder Flexion  150 Degrees    Left Shoulder ABduction  90 Degrees    Left Shoulder External Rotation  45 Degrees                   [facility] Adult PT Treatment/Exercise - 06/10/19 1020      Exercises   Exercises  Shoulder      Shoulder Exercises: Supine   External Rotation  Right;AROM;PROM;Strengthening;12 reps    External  Rotation Limitations  In scaption with arm abducted to 45 deg, with sub maximal contract/relax and passive overpressure    Flexion  AROM;PROM;Left;12 reps    ABduction  AROM;PROM;Left;10 reps      Shoulder Exercises: Seated   Other Seated Exercises  seated scapular depression into physioball (at edge of mat) x 10 reps      Shoulder Exercises: Prone   Retraction  Strengthening;Left;10 reps    Extension  Strengthening;Left;10 reps    External Rotation  Strengthening;Left;10 reps    External Rotation Limitations  with PT assisting to support weight of UE      Shoulder Exercises: Standing   External Rotation  AROM;Both;5 reps    External Rotation Limitations  reaching arms behind head with mirror for cues on mechanics    Flexion  AROM;Strengthening;Left;10 reps    Flexion Limitations  AROM against gravity working on scapular stabilization    ABduction  AROM;Strengthening;Left;10 reps    ABduction Limitations  with mirror for cues on shoulder position      Shoulder Exercises: Pulleys   Flexion  2 minutes    Scaption  2 minutes      Shoulder Exercises: ROM/Strengthening   Other ROM/Strengthening Exercises  Wall walks up to 32 flexion (top rung), and up to 31 with scaption.      Vasopneumatic   Number Minutes Vasopneumatic   10 minutes    Vasopnuematic Location   Shoulder    Vasopneumatic Pressure  Low    Vasopneumatic Temperature   34 deg               PT Short Term Goals - 05/27/19 1030      PT SHORT TERM GOAL #1   Title  independent with initial HEP    Time  6    Period  Weeks    Status  Achieved      PT SHORT TERM GOAL #2   Title  improve Lt shoulder PROM to limits of protocol (flexion 90 deg; er 20 deg) in preparation to move into next phase of protocol    Time  6    Period  Weeks    Status  Achieved      PT SHORT TERM GOAL #3   Title  report pain < 4/10 with PROM for improved pain and tolerance    Baseline  pain 2-3/10 at night    Status  Achieved  PT Long Term Goals - 06/07/19 1216      PT LONG TERM GOAL #1   Title  independent with advanced HEP    Status  On-going    Target Date  06/27/19      PT LONG TERM GOAL #2   Title  FOTO score improved to </= 44% limited for improved function    Baseline  50% limited 05/27/19    Period  Weeks    Status  On-going      PT LONG TERM GOAL #3   Title  demonstrate Lt shoulder strength at least 3/5 for improved function    Status  On-going      PT LONG TERM GOAL #4   Title  Lt shoulder AROM improved to within 5 deg of Rt shoulder for flexion, abduction and external rotation for improved function    Status  On-going      PT LONG TERM GOAL #5   Title  report pain < 2/10 for improved function and mobility    Status  Partially Met            Plan - 06/10/19 1254    Clinical Impression Statement  The patient is continuing with pain at end range of mobility, pain during the night and soreness post exercise.  PT progressed resistance activities last week adding theraband activities to HEP.  Plan to continue to progress per shoulder protocol.    PT Treatment/Interventions  ADLs/Self Care Home Management;Cryotherapy;Electrical Stimulation;Ultrasound;Moist Heat;Iontophoresis 18m/ml Dexamethasone;Therapeutic activities;Therapeutic exercise;Patient/family education;Manual techniques;Passive range of motion;Scar mobilization;Vasopneumatic Device;Taping    PT Next Visit Plan  Continue shoulder ROM and strengthening, continue prone scapular stabilization, functional overhead activities and strengthening as mechanics allow.    PT Home Exercise Plan  Access Code: [email or web address]   Consulted and Agree with Plan of Care  Patient       Patient will benefit from skilled therapeutic intervention in order to improve the following deficits and impairments:  Decreased range of motion, Increased fascial restricitons, Increased muscle spasms, Pain, Postural dysfunction, Decreased strength  Visit  Diagnosis: Stiffness of left shoulder, not elsewhere classified  Muscle weakness (generalized)  Chronic left shoulder pain  Acute pain of left shoulder  Abnormal posture  Other symptoms and signs involving the musculoskeletal system     Problem List Patient Active Problem List   Diagnosis Date Noted  . Thrombocytopenia ([place] 02/03/2019  . Pneumonia due to COVID-19 virus 12/20/2018  . Mass of left thigh 05/24/2018  . History of DVT (deep vein thrombosis) 05/09/2018  . Pain and swelling of left lower extremity 05/09/2018  . Cold sore 04/26/2018  . Primary insomnia 10/30/2017  . S/P cervical spinal fusion 09/28/2017  . Superficial venous thrombosis of right arm 01/05/2017  . Left shoulder pain 01/05/2017  . Depression 05/02/2016  . Ingrown right big toenail 02/16/2016  . Obesity 01/23/2015  . Right shoulder pain 01/23/2015  . Former smoker 08/28/2014  . Nephrolithiasis 07/25/2014  . Diabetes mellitus, type 2 ([place] 07/25/2014  . Essential hypertension, benign 07/25/2014  . Annual physical exam 07/25/2014  . Hyperlipidemia 07/25/2014    [place], PT 06/10/2019, 12:56 PM  [facility] [place] [address]Phone: [phone]  Fax:  [phone] Name: Robert HARDGROVEMRN: [id number] of Birth: 906-03-1956

## 2019-06-12 ENCOUNTER — Ambulatory Visit (INDEPENDENT_AMBULATORY_CARE_PROVIDER_SITE_OTHER): Payer: 59 | Admitting: Physical Therapy

## 2019-06-12 ENCOUNTER — Other Ambulatory Visit: Payer: Self-pay

## 2019-06-12 DIAGNOSIS — M25612 Stiffness of left shoulder, not elsewhere classified: Secondary | ICD-10-CM

## 2019-06-12 DIAGNOSIS — M25512 Pain in left shoulder: Secondary | ICD-10-CM

## 2019-06-12 DIAGNOSIS — G8929 Other chronic pain: Secondary | ICD-10-CM

## 2019-06-12 DIAGNOSIS — R293 Abnormal posture: Secondary | ICD-10-CM

## 2019-06-12 DIAGNOSIS — M6281 Muscle weakness (generalized): Secondary | ICD-10-CM | POA: Diagnosis not present

## 2019-06-12 NOTE — Therapy (Signed)
[place], [place], [address] Phone: [phone]   Fax:  [phone]  Physical Therapy Treatment  Patient Details  Name: Robert Hines MRN: [id number] Date of Birth: 26-Mar-1956 Referring Provider (PT): Tania Ade, MD   Encounter Date: 06/12/2019  PT End of Session - 06/12/19 0940    Visit Number  27    Number of Visits  30    Date for PT Re-Evaluation  06/27/19    PT Start Time  0933    PT Stop Time  1017    PT Time Calculation (min)  44 min    Activity Tolerance  Patient tolerated treatment well    Behavior During Therapy  [facility] for tasks assessed/performed       Past Medical History:  Diagnosis Date  . Arthritis    neck  . Depression   . Diabetes mellitus without complication ([place])    type 2  . Hyperlipidemia   . Hypertension   . Pneumonia    covid pneumonia and covid july 2020, d/ced july 16th from [place] stayed 9 days  . Renal disorder    calculi, multiple lithotripsies, stents  . Right cataract   . Rotator cuff disorder   . Spinal pain     Past Surgical History:  Procedure Laterality Date  . ANTERIOR FUSION CERVICAL SPINE  2007  . ARTHOSCOPIC ROTAOR CUFF REPAIR Left 03/21/2019   Procedure: ARTHROSCOPIC ROTATOR CUFF REPAIR;  Surgeon: Tania Ade, MD;  Location: [facility];  Service: Orthopedics;  Laterality: Left;  . EYE SURGERY Left    cataract  . FOOT SURGERY Right    pin placed and removed  . HERNIA REPAIR     inguinal  . LITHOTRIPSY     several  . SHOULDER ARTHROSCOPY WITH ROTATOR CUFF REPAIR AND SUBACROMIAL DECOMPRESSION Right 12/28/2015   Procedure: SHOULDER ARTHROSCOPY TAKE DOWN ROTATOR CUFF REPAIR AND SUBACROMIAL DECOMPRESSION, DEBRIDEMENT OF SLAP TEAR;  Surgeon: Tania Ade, MD;  Location: [place];  Service: Orthopedics;  Laterality: Right;  SHOULDER ARTHROSCOPY TAKE DOWN ROTATOR CUFF REPAIR AND SUBACROMIAL  DECOMPRESSION  . SHOULDER ARTHROSCOPY WITH SUBACROMIAL DECOMPRESSION Left 03/21/2019   Procedure: SHOULDER ARTHROSCOPY WITH SUBACROMIAL DECOMPRESSION;  Surgeon: Tania Ade, MD;  Location: [place];  Service: Orthopedics;  Laterality: Left;  [name] VASECTOMY Bilateral     There were no vitals filed for this visit.  Subjective Assessment - 06/12/19 0940    Subjective  Pt reports his shoulder has been "giving him a fit" since yesterday morning.  Pt returns to MD this morning.    Currently in Pain?  Yes    Pain Score  2    4/10 with movement   Pain Location  Shoulder    Pain Orientation  Left;Anterior;Proximal    Aggravating Factors   pain with movement overhead, IR    Pain Relieving Factors  ice, medication         [facility] PT Assessment - 06/12/19 0001      Assessment   Medical Diagnosis  RTC repair    Referring Provider (PT)  Tania Ade, MD    Onset Date/Surgical Date  03/21/19    Hand Dominance  Right    Next MD Visit  06/12/19    Prior Therapy  following Rt RTC repair      [facility] Adult PT Treatment/Exercise - 06/12/19 0001      Shoulder Exercises: Supine   External Rotation  AAROM;Left;10 reps  cane   External Rotation Limitations  In scaption with arm abducted to 45 deg    Flexion  AAROM;Both;5 reps   cane     Shoulder Exercises: Standing   Internal Rotation  AAROM;Both;10 reps   cane behind back   Extension  AAROM;Both;10 reps   cane   Other Standing Exercises  wall ladder with LUE in flexion x 5 reps, up to #30    Other Standing Exercises  pendulum as needed for pain relief      Shoulder Exercises: Pulleys   Flexion  2 minutes    Scaption  2 minutes      Shoulder Exercises: ROM/Strengthening   UBE (Upper Arm Bike)  L1: 1.5 forward/ 1.5 backward (standing)      Vasopneumatic   Number Minutes Vasopneumatic   10 minutes    Vasopnuematic Location   Shoulder    Vasopneumatic Pressure  Low    Vasopneumatic Temperature   34 deg      Manual  Therapy   Joint Mobilization  Grade II PA mobs to Lt GH; gentle oscillating motions to reduce guarding     Soft tissue mobilization  STM to Lt upper trap, levator, rhomboid, upper thoracic paraspinals.  TPR to Lt subscap; MFR of Lt bicep    Scapular Mobilization  Lt scapula in superior/inf, protraction     Passive ROM  PROM of Lt elbow in ext,  Lt shoulder ER         PT Short Term Goals - 05/27/19 1030      PT SHORT TERM GOAL #1   Title  independent with initial HEP    Time  6    Period  Weeks    Status  Achieved      PT SHORT TERM GOAL #2   Title  improve Lt shoulder PROM to limits of protocol (flexion 90 deg; er 20 deg) in preparation to move into next phase of protocol    Time  6    Period  Weeks    Status  Achieved      PT SHORT TERM GOAL #3   Title  report pain < 4/10 with PROM for improved pain and tolerance    Baseline  pain 2-3/10 at night    Status  Achieved        PT Long Term Goals - 06/07/19 1216      PT LONG TERM GOAL #1   Title  independent with advanced HEP    Status  On-going    Target Date  06/27/19      PT LONG TERM GOAL #2   Title  FOTO score improved to </= 44% limited for improved function    Baseline  50% limited 05/27/19    Period  Weeks    Status  On-going      PT LONG TERM GOAL #3   Title  demonstrate Lt shoulder strength at least 3/5 for improved function    Status  On-going      PT LONG TERM GOAL #4   Title  Lt shoulder AROM improved to within 5 deg of Rt shoulder for flexion, abduction and external rotation for improved function    Status  On-going      PT LONG TERM GOAL #5   Title  report pain < 2/10 for improved function and mobility    Status  Partially Met            Plan - 06/12/19 1317    Clinical  Impression Statement  Pt presents with elevated pain level upon arrival; limited tolerance for exercise today. Pt very point tender in rhomboid minor and levator, along with prox bicep tendon.  Pt returns to MD today for  follow up. Plan to continue to progress per shoulder protocol.    Rehab Potential  Good    PT Frequency  3x / week    PT Duration  12 weeks    PT Treatment/Interventions  ADLs/Self Care Home Management;Cryotherapy;Electrical Stimulation;Ultrasound;Moist Heat;Iontophoresis 9m/ml Dexamethasone;Therapeutic activities;Therapeutic exercise;Patient/family education;Manual techniques;Passive range of motion;Scar mobilization;Vasopneumatic Device;Taping    PT Next Visit Plan  await further advisement from MD.  progress per protocol.    PT Home Exercise Plan  Access Code: [email or web address]   Consulted and Agree with Plan of Care  Patient       Patient will benefit from skilled therapeutic intervention in order to improve the following deficits and impairments:  Decreased range of motion, Increased fascial restricitons, Increased muscle spasms, Pain, Postural dysfunction, Decreased strength  Visit Diagnosis: Stiffness of left shoulder, not elsewhere classified  Muscle weakness (generalized)  Chronic left shoulder pain  Acute pain of left shoulder  Abnormal posture     Problem List Patient Active Problem List   Diagnosis Date Noted  . [place] 02/03/2019  . Pneumonia due to COVID-19 virus 12/20/2018  . Mass of left thigh 05/24/2018  . History of DVT (deep vein thrombosis) 05/09/2018  . Pain and swelling of left lower extremity 05/09/2018  . Cold sore 04/26/2018  . Primary insomnia 10/30/2017  . S/P cervical spinal fusion 09/28/2017  . Superficial venous thrombosis of right arm 01/05/2017  . Left shoulder pain 01/05/2017  . Depression 05/02/2016  . Ingrown right big toenail 02/16/2016  . Obesity 01/23/2015  . Right shoulder pain 01/23/2015  . Former smoker 08/28/2014  . Nephrolithiasis 07/25/2014  . Diabetes mellitus, type 2 ([place] 07/25/2014  . Essential hypertension, benign 07/25/2014  . Annual physical exam 07/25/2014  . Hyperlipidemia 07/25/2014   JKerin Perna PTA 06/12/19 1:21 PM  [facility] [place] [address]Phone: [phone]  Fax:  [phone] Name: Robert EICKMRN: [id number] of Birth: 904-12-1955

## 2019-06-17 ENCOUNTER — Other Ambulatory Visit: Payer: Self-pay | Admitting: Sports Medicine

## 2019-06-17 DIAGNOSIS — E119 Type 2 diabetes mellitus without complications: Secondary | ICD-10-CM

## 2019-06-17 DIAGNOSIS — G8929 Other chronic pain: Secondary | ICD-10-CM

## 2019-06-18 ENCOUNTER — Other Ambulatory Visit: Payer: Self-pay

## 2019-06-18 ENCOUNTER — Ambulatory Visit (INDEPENDENT_AMBULATORY_CARE_PROVIDER_SITE_OTHER): Payer: 59 | Admitting: Physical Therapy

## 2019-06-18 ENCOUNTER — Encounter: Payer: Self-pay | Admitting: Physical Therapy

## 2019-06-18 DIAGNOSIS — M25512 Pain in left shoulder: Secondary | ICD-10-CM

## 2019-06-18 DIAGNOSIS — R293 Abnormal posture: Secondary | ICD-10-CM | POA: Diagnosis not present

## 2019-06-18 DIAGNOSIS — M25612 Stiffness of left shoulder, not elsewhere classified: Secondary | ICD-10-CM

## 2019-06-18 DIAGNOSIS — M6281 Muscle weakness (generalized): Secondary | ICD-10-CM | POA: Diagnosis not present

## 2019-06-18 DIAGNOSIS — G8929 Other chronic pain: Secondary | ICD-10-CM

## 2019-06-18 NOTE — Therapy (Signed)
[place], [place], [address] Phone: [phone]   Fax:  [phone]  Physical Therapy Treatment  Patient Details  Name: Robert Hines MRN: [id number] Date of Birth: 27-May-1956 Referring Provider (PT): Tania Ade, MD   Encounter Date: 06/18/2019  PT End of Session - 06/18/19 0923    Visit Number  28    Number of Visits  30    Date for PT Re-Evaluation  06/27/19    PT Start Time  0846    PT Stop Time  0925    PT Time Calculation (min)  39 min    Activity Tolerance  Patient tolerated treatment well    Behavior During Therapy  [facility] for tasks assessed/performed       Past Medical History:  Diagnosis Date  . Arthritis    neck  . Depression   . Diabetes mellitus without complication ([place])    type 2  . Hyperlipidemia   . Hypertension   . Pneumonia    covid pneumonia and covid july 2020, d/ced july 16th from [place] stayed 9 days  . Renal disorder    calculi, multiple lithotripsies, stents  . Right cataract   . Rotator cuff disorder   . Spinal pain     Past Surgical History:  Procedure Laterality Date  . ANTERIOR FUSION CERVICAL SPINE  2007  . ARTHOSCOPIC ROTAOR CUFF REPAIR Left 03/21/2019   Procedure: ARTHROSCOPIC ROTATOR CUFF REPAIR;  Surgeon: Tania Ade, MD;  Location: [facility];  Service: Orthopedics;  Laterality: Left;  . EYE SURGERY Left    cataract  . FOOT SURGERY Right    pin placed and removed  . HERNIA REPAIR     inguinal  . LITHOTRIPSY     several  . SHOULDER ARTHROSCOPY WITH ROTATOR CUFF REPAIR AND SUBACROMIAL DECOMPRESSION Right 12/28/2015   Procedure: SHOULDER ARTHROSCOPY TAKE DOWN ROTATOR CUFF REPAIR AND SUBACROMIAL DECOMPRESSION, DEBRIDEMENT OF SLAP TEAR;  Surgeon: Tania Ade, MD;  Location: [place];  Service: Orthopedics;  Laterality: Right;  SHOULDER ARTHROSCOPY TAKE DOWN ROTATOR CUFF REPAIR AND SUBACROMIAL  DECOMPRESSION  . SHOULDER ARTHROSCOPY WITH SUBACROMIAL DECOMPRESSION Left 03/21/2019   Procedure: SHOULDER ARTHROSCOPY WITH SUBACROMIAL DECOMPRESSION;  Surgeon: Tania Ade, MD;  Location: [place];  Service: Orthopedics;  Laterality: Left;  [name] VASECTOMY Bilateral     There were no vitals filed for this visit.  Subjective Assessment - 06/18/19 0848    Subjective  Pt reports he saw MD and received injection 6 days ago.  "That shot hurt like the dickens".  Pt has been cleared to return to work on Monday.    Patient Stated Goals  regain function/use of Lt shoulder    Currently in Pain?  No/denies    Pain Score  0-No pain         [facility] PT Assessment - 06/18/19 0001      Assessment   Medical Diagnosis  RTC repair    Referring Provider (PT)  Tania Ade, MD    Onset Date/Surgical Date  03/21/19    Hand Dominance  Right    Next MD Visit  07/24/19    Prior Therapy  following Rt RTC repair      ROM / Strength   AROM / PROM / Strength  Strength;AROM      AROM   Left Shoulder Flexion  122 Degrees   in standing     Strength   Strength Assessment Site  Shoulder    Right/Left Shoulder  Left    Left Shoulder Flexion  4+/5    Left Shoulder Extension  --   5-/5   Left Shoulder ABduction  4+/5    Left Shoulder Internal Rotation  5/5    Left Shoulder External Rotation  4/5        [facility] Adult PT Treatment/Exercise - 06/18/19 0001      Shoulder Exercises: Standing   External Rotation  Both;10 reps;Theraband    Theraband Level (Shoulder External Rotation)  Level 2 (Red)   2 sets   Row  Both;10 reps;Theraband    Theraband Level (Shoulder Row)  Level 3 (Green)    Other Standing Exercises  box lift floor to waist x 3 reps with 17-27#;  rockwood foward punch with red band x 10 reps, Lt bicep curl with green band x 10     Other Standing Exercises  pendulum as needed for pain relief      Shoulder Exercises: Pulleys   Flexion  2 minutes    Flexion Limitations   cues to slow down    Scaption  2 minutes      Shoulder Exercises: ROM/Strengthening   UBE (Upper Arm Bike)  L4: 2 forward/ 1 backward (standing)    Wall Pushups Limitations  reverse wall push ups x 12 reps      Shoulder Exercises: Stretch   Corner Stretch  2 reps;20 seconds    Corner Stretch Limitations  limited tolerance    Cross Chest Stretch  2 reps;20 seconds   LUE   Internal Rotation Stretch  1 rep   behind back   Other Shoulder Stretches  standing with bil hands on top of doorway x 10 sec    Other Shoulder Stretches  bilat bicep stretch holding door frame x 20 sec x 2 reps      Vasopneumatic   Number Minutes Vasopneumatic   10 minutes    Vasopnuematic Location   Shoulder   Lt   Vasopneumatic Pressure  Low    Vasopneumatic Temperature   34 deg             PT Education - 06/18/19 0922    Education Details  HEP    Person(s) Educated  Patient    Methods  Explanation;Handout;Demonstration;Verbal cues    Comprehension  Returned demonstration;Verbalized understanding       PT Short Term Goals - 05/27/19 1030      PT SHORT TERM GOAL #1   Title  independent with initial HEP    Time  6    Period  Weeks    Status  Achieved      PT SHORT TERM GOAL #2   Title  improve Lt shoulder PROM to limits of protocol (flexion 90 deg; er 20 deg) in preparation to move into next phase of protocol    Time  6    Period  Weeks    Status  Achieved      PT SHORT TERM GOAL #3   Title  report pain < 4/10 with PROM for improved pain and tolerance    Baseline  pain 2-3/10 at night    Status  Achieved        PT Long Term Goals - 06/07/19 1216      PT LONG TERM GOAL #1   Title  independent with advanced HEP    Status  On-going    Target Date  06/27/19      PT LONG TERM  GOAL #2   Title  FOTO score improved to </= 44% limited for improved function    Baseline  50% limited 05/27/19    Period  Weeks    Status  On-going      PT LONG TERM GOAL #3   Title  demonstrate Lt shoulder  strength at least 3/5 for improved function    Status  On-going      PT LONG TERM GOAL #4   Title  Lt shoulder AROM improved to within 5 deg of Rt shoulder for flexion, abduction and external rotation for improved function    Status  On-going      PT LONG TERM GOAL #5   Title  report pain < 2/10 for improved function and mobility    Status  Partially Met            Plan - 06/18/19 0921    Clinical Impression Statement  Pt tolerated exercises with greater ease today.  Pt continues to have strength deficits in Lt shoulder flexion, abdct, and ER.  Added some resistance exercises to HEP; performed without pain/difficulty.  Pt will benefit from continued PT intervention to maximize functional mobility with less pain.    Rehab Potential  Good    PT Frequency  3x / week    PT Duration  12 weeks    PT Treatment/Interventions  ADLs/Self Care Home Management;Cryotherapy;Electrical Stimulation;Ultrasound;Moist Heat;Iontophoresis 13m/ml Dexamethasone;Therapeutic activities;Therapeutic exercise;Patient/family education;Manual techniques;Passive range of motion;Scar mobilization;Vasopneumatic Device;Taping    PT Next Visit Plan  progressive Lt shoulder strengthening and ROM.    PT Home Exercise Plan  Access Code: [email or web address]   Consulted and Agree with Plan of Care  Patient       Patient will benefit from skilled therapeutic intervention in order to improve the following deficits and impairments:  Decreased range of motion, Increased fascial restricitons, Increased muscle spasms, Pain, Postural dysfunction, Decreased strength  Visit Diagnosis: Stiffness of left shoulder, not elsewhere classified  Muscle weakness (generalized)  Chronic left shoulder pain  Abnormal posture     Problem List Patient Active Problem List   Diagnosis Date Noted  . Thrombocytopenia ([place] 02/03/2019  . Pneumonia due to COVID-19 virus 12/20/2018  . Mass of left thigh 05/24/2018  . History of DVT (deep vein  thrombosis) 05/09/2018  . Pain and swelling of left lower extremity 05/09/2018  . Cold sore 04/26/2018  . Primary insomnia 10/30/2017  . S/P cervical spinal fusion 09/28/2017  . Superficial venous thrombosis of right arm 01/05/2017  . Left shoulder pain 01/05/2017  . Depression 05/02/2016  . Ingrown right big toenail 02/16/2016  . Obesity 01/23/2015  . Right shoulder pain 01/23/2015  . Former smoker 08/28/2014  . Nephrolithiasis 07/25/2014  . Diabetes mellitus, type 2 ([place] 07/25/2014  . Essential hypertension, benign 07/25/2014  . Annual physical exam 07/25/2014  . Hyperlipidemia 07/25/2014   JKerin Perna PTA 06/18/19 9:30 AM  [place] [address]Phone: [phone]  Fax:  [phone] Name: GTANVIR HIPPLEMRN: [id number] of Birth: [birth year]

## 2019-06-25 ENCOUNTER — Other Ambulatory Visit: Payer: Self-pay

## 2019-06-25 ENCOUNTER — Ambulatory Visit (INDEPENDENT_AMBULATORY_CARE_PROVIDER_SITE_OTHER): Payer: 59 | Admitting: Physical Therapy

## 2019-06-25 DIAGNOSIS — M25612 Stiffness of left shoulder, not elsewhere classified: Secondary | ICD-10-CM

## 2019-06-25 DIAGNOSIS — M6281 Muscle weakness (generalized): Secondary | ICD-10-CM

## 2019-06-25 DIAGNOSIS — M25512 Pain in left shoulder: Secondary | ICD-10-CM | POA: Diagnosis not present

## 2019-06-25 DIAGNOSIS — R293 Abnormal posture: Secondary | ICD-10-CM

## 2019-06-25 DIAGNOSIS — G8929 Other chronic pain: Secondary | ICD-10-CM

## 2019-06-25 NOTE — Therapy (Addendum)
[place] [address], [place], [address] Phone: [phone]   Fax:  [phone]  Physical Therapy Treatment and Discharge Summary  Patient Details  Name: Robert Hines MRN: [id number] Date of Birth: 06-Oct-1955 Referring Provider (PT): Tania Ade, MD   Encounter Date: 06/25/2019  PT End of Session - 06/25/19 1022    Visit Number  29    Number of Visits  30    Date for PT Re-Evaluation  06/27/19    PT Start Time  1017    PT Stop Time  1056    PT Time Calculation (min)  39 min       Past Medical History:  Diagnosis Date  . Arthritis    neck  . Depression   . Diabetes mellitus without complication ([facility])    type 2  . Hyperlipidemia   . Hypertension   . Pneumonia    covid pneumonia and covid july 2020, d/ced july 16th from [facility] stayed 9 days  . Renal disorder    calculi, multiple lithotripsies, stents  . Right cataract   . Rotator cuff disorder   . Spinal pain     Past Surgical History:  Procedure Laterality Date  . ANTERIOR FUSION CERVICAL SPINE  2007  . ARTHOSCOPIC ROTAOR CUFF REPAIR Left 03/21/2019   Procedure: ARTHROSCOPIC ROTATOR CUFF REPAIR;  Surgeon: Tania Ade, MD;  Location: [facility];  Service: Orthopedics;  Laterality: Left;  . EYE SURGERY Left    cataract  . FOOT SURGERY Right    pin placed and removed  . HERNIA REPAIR     inguinal  . LITHOTRIPSY     several  . SHOULDER ARTHROSCOPY WITH ROTATOR CUFF REPAIR AND SUBACROMIAL DECOMPRESSION Right 12/28/2015   Procedure: SHOULDER ARTHROSCOPY TAKE DOWN ROTATOR CUFF REPAIR AND SUBACROMIAL DECOMPRESSION, DEBRIDEMENT OF SLAP TEAR;  Surgeon: Tania Ade, MD;  Location: [facility];  Service: Orthopedics;  Laterality: Right;  SHOULDER ARTHROSCOPY TAKE DOWN ROTATOR CUFF REPAIR AND SUBACROMIAL DECOMPRESSION  . SHOULDER ARTHROSCOPY WITH SUBACROMIAL DECOMPRESSION Left 03/21/2019   Procedure:  SHOULDER ARTHROSCOPY WITH SUBACROMIAL DECOMPRESSION;  Surgeon: Tania Ade, MD;  Location: [facility];  Service: Orthopedics;  Laterality: Left;  [name] VASECTOMY Bilateral     There were no vitals filed for this visit.  Subjective Assessment - 06/25/19 1020    Subjective  Pt has returned to work.  Yesterday was one "of best days" with Lt shoulder.  He states he worked it pretty good at work, and was sore last night.    Patient Stated Goals  regain function/use of Lt shoulder    Currently in Pain?  No/denies    Pain Score  0-No pain    Pain Location  Shoulder    Pain Orientation  Left         [facility] PT Assessment - 06/25/19 0001      Assessment   Medical Diagnosis  RTC repair    Referring Provider (PT)  Tania Ade, MD    Onset Date/Surgical Date  03/21/19    Hand Dominance  Right    Next MD Visit  07/24/19    Prior Therapy  following Rt RTC repair      Observation/Other Assessments   Focus on Therapeutic Outcomes (FOTO)   83% (17% limitation)      PROM   Left Shoulder Flexion  145 Degrees    Left Shoulder External Rotation  50 Degrees  [facility] Adult PT Treatment/Exercise - 06/25/19 0001      Shoulder Exercises: Supine   Flexion  AROM;Left;5 reps      Shoulder Exercises: ROM/Strengthening   UBE (Upper Arm Bike)  L4: 2 forward/ 1 backward (standing)    Wall Pushups Limitations  reverse wall push ups x 12 reps    Other ROM/Strengthening Exercises  wall wash with bilat arms in pillow case x 5 slow reps       Shoulder Exercises: Stretch   Cross Chest Stretch  2 reps;20 seconds   LUE   Internal Rotation Stretch  1 rep   behind back   Other Shoulder Stretches  Rt/Lt pec stretch with elbow straight x 20 sec x 2 reps each     Other Shoulder Stretches  bilat bicep stretch holding door frame x 20 sec x 2 reps      Vasopneumatic   Number Minutes Vasopneumatic   10 minutes    Vasopnuematic Location   Shoulder   Lt   Vasopneumatic Pressure  Low     Vasopneumatic Temperature   34 deg      Manual Therapy   Soft tissue mobilization  IASTM to Lt bicep and brachialis    Myofascial Release  MFR to Lt pec major/minor    Passive ROM  PROM into Lt shoulder ER               PT Short Term Goals - 05/27/19 1030      PT SHORT TERM GOAL #1   Title  independent with initial HEP    Time  6    Period  Weeks    Status  Achieved      PT SHORT TERM GOAL #2   Title  improve Lt shoulder PROM to limits of protocol (flexion 90 deg; er 20 deg) in preparation to move into next phase of protocol    Time  6    Period  Weeks    Status  Achieved      PT SHORT TERM GOAL #3   Title  report pain < 4/10 with PROM for improved pain and tolerance    Baseline  pain 2-3/10 at night    Status  Achieved        PT Long Term Goals - 06/25/19 1059      PT LONG TERM GOAL #1   Title  independent with advanced HEP    Status  Achieved      PT LONG TERM GOAL #2   Title  FOTO score improved to </= 44% limited for improved function    Period  Weeks    Status  Achieved      PT LONG TERM GOAL #3   Title  demonstrate Lt shoulder strength at least 3/5 for improved function    Status  Achieved      PT LONG TERM GOAL #4   Title  Lt shoulder AROM improved to within 5 deg of Rt shoulder for flexion, abduction and external rotation for improved function    Status  On-going      PT LONG TERM GOAL #5   Title  report pain < 2/10 for improved function and mobility    Status  Partially Met            Plan - 06/25/19 1100    Clinical Impression Statement  Pt continues with limited end ROM for Lt shoulder; reports some discomfort with stretches into ER, ext, overhead flexion; resolved with rest.  We verbally reviewed current HEP.  Pt verbalized satisifaction with current level of function and requested to hold therapy at this time.  His FOTO score improved. He has partially met his goals.    Rehab Potential  Good    PT Frequency  3x / week    PT  Duration  12 weeks    PT Treatment/Interventions  ADLs/Self Care Home Management;Cryotherapy;Electrical Stimulation;Ultrasound;Moist Heat;Iontophoresis 59m/ml Dexamethasone;Therapeutic activities;Therapeutic exercise;Patient/family education;Manual techniques;Passive range of motion;Scar mobilization;Vasopneumatic Device;Taping    PT Next Visit Plan  progressive Lt shoulder strengthening and ROM.    PT Home Exercise Plan  Access Code: [email or web address]   Consulted and Agree with Plan of Care  Patient       Patient will benefit from skilled therapeutic intervention in order to improve the following deficits and impairments:  Decreased range of motion, Increased fascial restricitons, Increased muscle spasms, Pain, Postural dysfunction, Decreased strength  Visit Diagnosis: Stiffness of left shoulder, not elsewhere classified  Muscle weakness (generalized)  Chronic left shoulder pain  Abnormal posture    PHYSICAL THERAPY DISCHARGE SUMMARY  Visits from Start of Care: 29  Current functional level related to goals / functional outcomes: See above   Remaining deficits: See note above for last known status-- patient requested hold   Education / Equipment: Home program.  Plan: Patient agrees to discharge.  Patient goals were partially met. Patient is being discharged due to meeting the stated rehab goals.  ?????         Thank you for the referral of this patient. CRudell Cobb MPT  Problem List Patient Active Problem List   Diagnosis Date Noted  . Thrombocytopenia ([place] 02/03/2019  . Pneumonia due to COVID-19 virus 12/20/2018  . Mass of left thigh 05/24/2018  . History of DVT (deep vein thrombosis) 05/09/2018  . Pain and swelling of left lower extremity 05/09/2018  . Cold sore 04/26/2018  . Primary insomnia 10/30/2017  . S/P cervical spinal fusion 09/28/2017  . Superficial venous thrombosis of right arm 01/05/2017  . Left shoulder pain 01/05/2017  . Depression 05/02/2016   . Ingrown right big toenail 02/16/2016  . Obesity 01/23/2015  . Right shoulder pain 01/23/2015  . Former smoker 08/28/2014  . Nephrolithiasis 07/25/2014  . Diabetes mellitus, type 2 ([place] 07/25/2014  . Essential hypertension, benign 07/25/2014  . Annual physical exam 07/25/2014  . Hyperlipidemia 07/25/2014   JKerin Perna PTA 06/25/19 12:54 PM  [address] [address] [address]Phone: [phone]  Fax:  [phone] Name: Robert TALLONMRN: [id number] of Birth: 901/17/1957

## 2019-06-27 ENCOUNTER — Encounter: Payer: 59 | Admitting: Physical Therapy

## 2019-07-02 ENCOUNTER — Encounter: Payer: 59 | Admitting: Physical Therapy

## 2019-07-04 ENCOUNTER — Encounter: Payer: 59 | Admitting: Rehabilitative and Restorative Service Providers"

## 2019-08-01 ENCOUNTER — Other Ambulatory Visit: Payer: Self-pay | Admitting: Sports Medicine

## 2019-08-01 DIAGNOSIS — F32A Depression, unspecified: Secondary | ICD-10-CM

## 2019-08-01 DIAGNOSIS — F329 Major depressive disorder, single episode, unspecified: Secondary | ICD-10-CM

## 2019-08-14 ENCOUNTER — Other Ambulatory Visit: Payer: Self-pay | Admitting: Sports Medicine

## 2019-08-14 MED ORDER — TIZANIDINE HCL 4 MG PO TABS: 4.0000 mg | ORAL_TABLET | Freq: Three times a day (TID) | ORAL | 1 refills | Status: DC | PRN

## 2019-08-15 ENCOUNTER — Encounter: Payer: Self-pay | Admitting: Sports Medicine

## 2019-08-15 ENCOUNTER — Other Ambulatory Visit: Payer: Self-pay

## 2019-08-15 ENCOUNTER — Ambulatory Visit (INDEPENDENT_AMBULATORY_CARE_PROVIDER_SITE_OTHER): Payer: 59 | Admitting: Sports Medicine

## 2019-08-15 VITALS — BP 126/71 | HR 84 | Ht 73.0 in | Wt 241.0 lb

## 2019-08-15 DIAGNOSIS — E119 Type 2 diabetes mellitus without complications: Secondary | ICD-10-CM | POA: Diagnosis not present

## 2019-08-15 DIAGNOSIS — R7401 Elevation of levels of liver transaminase levels: Secondary | ICD-10-CM | POA: Diagnosis not present

## 2019-08-15 DIAGNOSIS — Z0000 Encounter for general adult medical examination without abnormal findings: Secondary | ICD-10-CM

## 2019-08-15 DIAGNOSIS — Z794 Long term (current) use of insulin: Secondary | ICD-10-CM

## 2019-08-15 DIAGNOSIS — N400 Enlarged prostate without lower urinary tract symptoms: Secondary | ICD-10-CM

## 2019-08-15 DIAGNOSIS — E782 Mixed hyperlipidemia: Secondary | ICD-10-CM | POA: Diagnosis not present

## 2019-08-15 DIAGNOSIS — I10 Essential (primary) hypertension: Secondary | ICD-10-CM

## 2019-08-15 DIAGNOSIS — M25512 Pain in left shoulder: Secondary | ICD-10-CM

## 2019-08-15 NOTE — Progress Notes (Addendum)
Subjective:    CC: Annual Physical Exam  HPI:  This patient is here for their annual physical.  I reviewed the past medical history, family history, social history, surgical history, and allergies today and no changes were needed.  Please see the problem list section below in epic for further details.  Past Medical History: Past Medical History:  Diagnosis Date  . Arthritis    neck  . Depression   . Diabetes mellitus without complication ([facility])    type 2  . Hyperlipidemia   . Hypertension   . Pneumonia    covid pneumonia and covid july 2020, d/ced july 16th from [facility] stayed 9 days  . Renal disorder    calculi, multiple lithotripsies, stents  . Right cataract   . Rotator cuff disorder   . Spinal pain    Past Surgical History: Past Surgical History:  Procedure Laterality Date  . ANTERIOR FUSION CERVICAL SPINE  2007  . ARTHOSCOPIC ROTAOR CUFF REPAIR Left 03/21/2019   Procedure: ARTHROSCOPIC ROTATOR CUFF REPAIR;  Surgeon: Jones Broom, MD;  Location[facility];  Service: Orthopedics;  Laterality: Left;  . EYE SURGERY Left    cataract  . FOOT SURGERY Right    pin placed and removed  . HERNIA REPAIR     inguinal  . LITHOTRIPSY     several  . SHOULDER ARTHROSCOPY WITH ROTATOR CUFF REPAIR AND SUBACROMIAL DECOMPRESSION Right 12/28/2015   Procedure: SHOULDER ARTHROSCOPY TAKE DOWN ROTATOR CUFF REPAIR AND SUBACROMIAL DECOMPRESSION, DEBRIDEMENT OF SLAP TEAR;  Surgeon: Jones Broom, MD;  Location: [facility];  Service: Orthopedics;  Laterality: Right;  SHOULDER ARTHROSCOPY TAKE DOWN ROTATOR CUFF REPAIR AND SUBACROMIAL DECOMPRESSION  . SHOULDER ARTHROSCOPY WITH SUBACROMIAL DECOMPRESSION Left 03/21/2019   Procedure: SHOULDER ARTHROSCOPY WITH SUBACROMIAL DECOMPRESSION;  Surgeon: Jones Broom, MD;  Location: [facility];  Service: Orthopedics;  Laterality: Left;  [name] VASECTOMY Bilateral    Social History: Social History     Socioeconomic History  . Marital status: Married    Spouse name: Not on file  . Number of children: Not on file  . Years of education: Not on file  . Highest education level: Not on file  Occupational History  . Not on file  Tobacco Use  . Smoking status: Former Smoker    Types: Cigarettes    Quit date: 10/01/2018    Years since quitting: 0.8  . Smokeless tobacco: Never Used  Substance and Sexual Activity  . Alcohol use: Yes    Alcohol/week: 0.0 standard drinks    Comment: social  . Drug use: No  . Sexual activity: Yes    Birth control/protection: None  Other Topics Concern  . Not on file  Social History Narrative  . Not on file   Social Determinants of Health   Financial Resource Strain:   . Difficulty of Paying Living Expenses: Not on file  Food Insecurity:   . Worried About Programme researcher, broadcasting/film/video in the Last Year: Not on file  . Ran Out of Food in the Last Year: Not on file  Transportation Needs:   . Lack of Transportation (Medical): Not on file  . Lack of Transportation (Non-Medical): Not on file  Physical Activity:   . Days of Exercise per Week: Not on file  . Minutes of Exercise per Session: Not on file  Stress:   . Feeling of Stress : Not on file  Social Connections:   . Frequency of Communication with Friends and Family:  Not on file  . Frequency of Social Gatherings with Friends and Family: Not on file  . Attends Religious Services: Not on file  . Active Member of Clubs or Organizations: Not on file  . Attends Archivist Meetings: Not on file  . Marital Status: Not on file   Family History: Family History  Problem Relation Age of Onset  . Hypertension Mother   . Heart failure Father   . Diabetes Father   . Hypertension Father   . Heart failure Sister   . Hypertension Sister    Allergies: Allergies  Allergen Reactions  . Codeine Diarrhea and Nausea Only  . Duloxetine Diarrhea and Nausea Only    cymbalta is rash   Medications: See med  rec.  Review of Systems: No headache, visual changes, nausea, vomiting, diarrhea, constipation, dizziness, abdominal pain, skin rash, fevers, chills, night sweats, swollen lymph nodes, weight loss, chest pain, body aches, joint swelling, muscle aches, shortness of breath, mood changes, visual or auditory hallucinations.  Objective:    General: Well Developed, well nourished, and in no acute distress.  Neuro: Alert and oriented x3, extra-ocular muscles intact, sensation grossly intact. Cranial nerves II through XII are intact, motor, sensory, and coordinative functions are all intact. HEENT: Normocephalic, atraumatic, pupils equal round reactive to light, neck supple, no masses, no lymphadenopathy, thyroid nonpalpable. Oropharynx, nasopharynx, external ear canals are unremarkable. Skin: Warm and dry, no rashes noted.  Cardiac: Regular rate and rhythm, no murmurs rubs or gallops.  Respiratory: Clear to auscultation bilaterally. Not using accessory muscles, speaking in full sentences.  Abdominal: Soft, nontender, nondistended, positive bowel sounds, no masses, no organomegaly.  Musculoskeletal: Shoulder, elbow, wrist, hip, knee, ankle stable, and with full range of motion.  Impression and Recommendations:    The patient was counselled, risk factors were discussed, anticipatory guidance given.  Annual physical exam Annual physical as above. Adding routine labs. Return in 6 months. We are checking a PSA today, and we will do a digital rectal exam at the next 1 year follow-up, and alternate PSAs with DREs.  Diabetes mellitus, type 2 Historically under adequate control, rechecking labs today, diabetic foot exam today. If A1c is uncontrolled he would like 3 to 6 months to get it back under control himself before changing around his medications.  Essential hypertension, benign Under adequate control, continue blood pressure medications.  Hyperlipidemia Continue medications, rechecking  lipids.  Left shoulder pain Patient did have his arthroscopy, last year. Continues to have pain, he did have a subacromial injection about a month ago without much improvement. Some of his pain is likely glenohumeral so I think his next injection should he desire it should be glenohumeral with ultrasound guidance.  Transaminitis Slight liver function abnormality, rechecking LFTs in a month. My suspicion is hepatic steatosis.   ___________________________________________ Gwen Her. Dianah Field, M.D., ABFM., CAQSM. Primary Care and Sports Medicine [facility]  Adjunct Professor of [employer]

## 2019-08-15 NOTE — Assessment & Plan Note (Signed)
Under adequate control, continue blood pressure medications.

## 2019-08-15 NOTE — Assessment & Plan Note (Signed)
Annual physical as above. Adding routine labs. Return in 6 months. We are checking a PSA today, and we will do a digital rectal exam at the next 1 year follow-up, and alternate PSAs with DREs.

## 2019-08-15 NOTE — Assessment & Plan Note (Signed)
Patient did have his arthroscopy, last year. Continues to have pain, he did have a subacromial injection about a month ago without much improvement. Some of his pain is likely glenohumeral so I think his next injection should he desire it should be glenohumeral with ultrasound guidance.

## 2019-08-15 NOTE — Assessment & Plan Note (Signed)
Historically under adequate control, rechecking labs today, diabetic foot exam today. If A1c is uncontrolled he would like 3 to 6 months to get it back under control himself before changing around his medications.

## 2019-08-15 NOTE — Assessment & Plan Note (Signed)
Continue medications, rechecking lipids.

## 2019-08-16 DIAGNOSIS — R7401 Elevation of levels of liver transaminase levels: Secondary | ICD-10-CM | POA: Insufficient documentation

## 2019-08-16 LAB — COMPLETE METABOLIC PANEL WITH GFR
AG Ratio: 2 (calc) (ref 1.0–2.5)
ALT: 56 U/L — ABNORMAL HIGH (ref 9–46)
AST: 40 U/L — ABNORMAL HIGH (ref 10–35)
Albumin: 4.5 g/dL (ref 3.6–5.1)
Alkaline phosphatase (APISO): 74 U/L (ref 35–144)
BUN: 18 mg/dL (ref 7–25)
CO2: 29 mmol/L (ref 20–32)
Calcium: 9.6 mg/dL (ref 8.6–10.3)
Chloride: 100 mmol/L (ref 98–110)
Creat: 1.04 mg/dL (ref 0.70–1.25)
GFR, Est African American: 87 mL/min/{1.73_m2} (ref >=60)
GFR, Est Non African American: 75 mL/min/{1.73_m2} (ref >=60)
Globulin: 2.3 g/dL (calc) (ref 1.9–3.7)
Glucose, Bld: 112 mg/dL (ref 65–139)
Potassium: 4.4 mmol/L (ref 3.5–5.3)
Sodium: 137 mmol/L (ref 135–146)
Total Bilirubin: 0.6 mg/dL (ref 0.2–1.2)
Total Protein: 6.8 g/dL (ref 6.1–8.1)

## 2019-08-16 LAB — CBC
HCT: 43.9 % (ref 38.5–50.0)
Hemoglobin: 15.5 g/dL (ref 13.2–17.1)
MCH: 33.8 pg — ABNORMAL HIGH (ref 27.0–33.0)
MCHC: 35.3 g/dL (ref 32.0–36.0)
MCV: 95.6 fL (ref 80.0–100.0)
MPV: 9.9 fL (ref 7.5–12.5)
Platelets: 182 10*3/uL (ref 140–400)
RBC: 4.59 10*6/uL (ref 4.20–5.80)
RDW: 13.6 % (ref 11.0–15.0)
WBC: 6.6 10*3/uL (ref 3.8–10.8)

## 2019-08-16 LAB — PSA, TOTAL AND FREE
PSA, % Free: 100 % (calc) (ref >25)
PSA, Free: 0.1 ng/mL
PSA, Total: 0.1 ng/mL (ref <=4.0)

## 2019-08-16 LAB — LIPID PANEL W/REFLEX DIRECT LDL
Cholesterol: 151 mg/dL (ref <200)
HDL: 44 mg/dL (ref >=40)
LDL Cholesterol (Calc): 81 mg/dL (calc)
Non-HDL Cholesterol (Calc): 107 mg/dL (calc) (ref <130)
Total CHOL/HDL Ratio: 3.4 (calc) (ref <5.0)
Triglycerides: 160 mg/dL — ABNORMAL HIGH (ref <150)

## 2019-08-16 LAB — TSH: TSH: 1.96 mIU/L (ref 0.40–4.50)

## 2019-08-16 LAB — HEMOGLOBIN A1C
Hgb A1c MFr Bld: 8.2 % of total Hgb — ABNORMAL HIGH (ref <5.7)
Mean Plasma Glucose: 189 (calc)
eAG (mmol/L): 10.4 (calc)

## 2019-08-16 NOTE — Assessment & Plan Note (Signed)
Slight liver function abnormality, rechecking LFTs in a month. My suspicion is hepatic steatosis.

## 2019-08-16 NOTE — Addendum Note (Signed)
Addended by: Monica Becton on: 08/16/2019 08:26 AM   Modules accepted: Orders

## 2019-08-29 ENCOUNTER — Other Ambulatory Visit: Payer: Self-pay

## 2019-08-29 ENCOUNTER — Emergency Department
Admission: EM | Admit: 2019-08-29 | Discharge: 2019-08-29 | Disposition: A | Discharge status: Home / Self Care | Payer: 59 | Source: Home / Self Care

## 2019-08-29 ENCOUNTER — Encounter: Payer: Self-pay | Admitting: Emergency Medicine

## 2019-08-29 DIAGNOSIS — R519 Headache, unspecified: Secondary | ICD-10-CM | POA: Diagnosis not present

## 2019-08-29 DIAGNOSIS — R52 Pain, unspecified: Secondary | ICD-10-CM | POA: Diagnosis not present

## 2019-08-29 DIAGNOSIS — R531 Weakness: Secondary | ICD-10-CM

## 2019-08-29 HISTORY — DX: COVID-19: U07.1

## 2019-08-29 LAB — POCT FASTING CBG KUC MANUAL ENTRY: POCT Glucose (KUC): 187 mg/dL — AB (ref 70–99)

## 2019-08-29 MED ORDER — KETOROLAC TROMETHAMINE 60 MG/2ML IM SOLN
60.0000 mg | Freq: Once | INTRAMUSCULAR | Status: AC
  Administered 2019-08-29: 18:00:00 60 mg via INTRAMUSCULAR

## 2019-08-29 NOTE — Discharge Instructions (Signed)
  Try to get plenty of rest, stay well hydrated with at least eight 8oz glasses of water a day.  You may also eat/drink soup broth, popsicles, jell-o, ice chips.    Please follow up with your family doctor tomorrow if not improving.  Call 911 or go to the hospital if symptoms significantly worsening- you develop chest pain, trouble breathing, passing out, or other new concerning symptoms develop.

## 2019-08-29 NOTE — ED Provider Notes (Signed)
Robert Hines CARE    CSN: [id number] Arrival date & time: 08/29/19  1723      History   Chief Complaint Chief Complaint  Patient presents with  . Headache    HPI Robert Hines is a 64 y.o. male.   HPI Robert Hines is a 64 y.o. male presenting to UC with c/o gradually worsening generalized HA with body aches, chills, temp of 103.2*F this morning, and weakness. Pt received his 2nd covid vaccination yesterday.  He had similar symptoms with his first dose but not as severe.  Pt also reports having Covid-19 in July 2020, was hospitalized for 9 days due to severe complications.  Pt discussed his symptoms with his PCP who recommended he take meloxicam, which he did this morning, along with tylenol every 4-6 hours but no relief of pain. He has had a decreased appetite today but due to a hx of diabetes, his wife encouraged him to have a glass of orange juice and a sandwhich earlier today.  Pt denies chest pain or SOB. Denies n/v/d.     Past Medical History:  Diagnosis Date  . Arthritis    neck  . COVID-19   . Depression   . Diabetes mellitus without complication (HCC)    type 2  . Hyperlipidemia   . Hypertension   . Pneumonia    covid pneumonia and covid july 2020, d/ced july 16th from [facility] stayed 9 days  . Renal disorder    calculi, multiple lithotripsies, stents  . Right cataract   . Rotator cuff disorder   . Spinal pain     Patient Active Problem List   Diagnosis Date Noted  . Transaminitis 08/16/2019  . Thrombocytopenia (HCC) 02/03/2019  . Pneumonia due to COVID-19 virus 12/20/2018  . Mass of left thigh 05/24/2018  . History of DVT (deep vein thrombosis) 05/09/2018  . Primary insomnia 10/30/2017  . S/P cervical spinal fusion 09/28/2017  . Left shoulder pain 01/05/2017  . Depression 05/02/2016  . Obesity 01/23/2015  . Right shoulder pain 01/23/2015  . Former smoker 08/28/2014  . Nephrolithiasis 07/25/2014  . Diabetes mellitus, type 2 (HCC)  07/25/2014  . Essential hypertension, benign 07/25/2014  . Annual physical exam 07/25/2014  . Hyperlipidemia 07/25/2014    Past Surgical History:  Procedure Laterality Date  . ANTERIOR FUSION CERVICAL SPINE  2007  . ARTHOSCOPIC ROTAOR CUFF REPAIR Left 03/21/2019   Procedure: ARTHROSCOPIC ROTATOR CUFF REPAIR;  Surgeon: Jones Broom, MD;  Location: [facility];  Service: Orthopedics;  Laterality: Left;  . EYE SURGERY Left    cataract  . FOOT SURGERY Right    pin placed and removed  . HERNIA REPAIR     inguinal  . LITHOTRIPSY     several  . SHOULDER ARTHROSCOPY WITH ROTATOR CUFF REPAIR AND SUBACROMIAL DECOMPRESSION Right 12/28/2015   Procedure: SHOULDER ARTHROSCOPY TAKE DOWN ROTATOR CUFF REPAIR AND SUBACROMIAL DECOMPRESSION, DEBRIDEMENT OF SLAP TEAR;  Surgeon: Jones Broom, MD;  Location: [facility];  Service: Orthopedics;  Laterality: Right;  SHOULDER ARTHROSCOPY TAKE DOWN ROTATOR CUFF REPAIR AND SUBACROMIAL DECOMPRESSION  . SHOULDER ARTHROSCOPY WITH SUBACROMIAL DECOMPRESSION Left 03/21/2019   Procedure: SHOULDER ARTHROSCOPY WITH SUBACROMIAL DECOMPRESSION;  Surgeon: Jones Broom, MD;  Location: [facility];  Service: Orthopedics;  Laterality: Left;  [name] VASECTOMY Bilateral        Home Medications    Prior to Admission medications   Medication Sig Start Date End Date Taking? Authorizing Provider  aspirin  81 MG chewable tablet Chew by mouth daily.    [provider]  buPROPion (WELLBUTRIN XL) 300 MG 24 hr tablet TAKE 1 TABLET BY MOUTH DAILY 08/01/19   Monica Becton, MD  cyclobenzaprine (FLEXERIL) 10 MG tablet Take 10 mg by mouth 3 (three) times daily as needed for muscle spasms.    [provider]  Dulaglutide (TRULICITY) 1.5 MG/0.5ML SOPN Wednesday in pm 06/08/19   Monica Becton, MD  gabapentin (NEURONTIN) 300 MG capsule TAKE 3 CAPSULES BY MOUTH IN THE MORNING AND 4 CAPSULES IN THE EVENING 02/28/19    Monica Becton, MD  Insulin Glargine (BASAGLAR KWIKPEN) 100 UNIT/ML SOPN Inject 0.94 mLs (94 Units total) into the skin at bedtime. 06/17/19   Monica Becton, MD  Insulin Pen Needle 32G X 4 MM MISC Use as needed with toujeo pen 08/04/15   Monica Becton, MD  lisinopril (ZESTRIL) 20 MG tablet Take 1 tablet (20 mg total) by mouth daily. 02/28/19   Monica Becton, MD  simvastatin (ZOCOR) 40 MG tablet Take 1 tablet (40 mg total) by mouth daily. 02/28/19   Monica Becton, MD  traMADol (ULTRAM) 50 MG tablet TAKE 2 TABLETS (100 MG TOTAL) BY MOUTH 3 (THREE) TIMES DAILY AS NEEDED. 06/17/19   Monica Becton, MD  XIGDUO XR 03-999 MG TB24 TAKE 1 TABLET BY MOUTH DAILY 12/28/18   Monica Becton, MD  zolpidem (AMBIEN) 10 MG tablet TAKE 1 TABLET AT BEDTIME AS NEEDED SLEEP 06/17/19   Monica Becton, MD    Family History Family History  Problem Relation Age of Onset  . Hypertension Mother   . Heart failure Father   . Diabetes Father   . Hypertension Father   . Heart failure Sister   . Hypertension Sister     Social History Social History   Tobacco Use  . Smoking status: Former Smoker    Types: Cigarettes    Quit date: 10/01/2018    Years since quitting: 0.9  . Smokeless tobacco: Never Used  Substance Use Topics  . Alcohol use: Yes    Alcohol/week: 0.0 standard drinks    Comment: social  . Drug use: No     Allergies   Codeine and Duloxetine   Review of Systems Review of Systems  Constitutional: Positive for appetite change, chills, fatigue and fever.  HENT: Negative for congestion, ear pain, sore throat, trouble swallowing and voice change.   Respiratory: Negative for cough and shortness of breath.   Cardiovascular: Negative for chest pain and palpitations.  Gastrointestinal: Negative for abdominal pain, diarrhea, nausea and vomiting.  Musculoskeletal: Positive for arthralgias, back pain and myalgias.  Skin: Negative for  rash.  Neurological: Positive for weakness (generalized) and headaches. Negative for dizziness and light-headedness.  All other systems reviewed and are negative.    Physical Exam Triage Vital Signs ED Triage Vitals  Enc Vitals Group     BP 08/29/19 1743 110/69     Pulse Rate 08/29/19 1743 100     Resp 08/29/19 1743 18     Temp 08/29/19 1743 98.7 F (37.1 C)     Temp src --      SpO2 08/29/19 1743 98 %     Weight 08/29/19 1744 235 lb (106.6 kg)     Height 08/29/19 1744 6' (1.829 m)     Head Circumference --      Peak Flow --      Pain Score 08/29/19 1743 7  Pain Loc --      Pain Edu? --      Excl. in [place]? --    No data found.  Updated Vital Signs BP 110/69 (BP Location: Left Arm)   Pulse 100   Temp 98.7 F (37.1 C)   Resp 18   Ht 6' (1.829 m)   Wt 235 lb (106.6 kg)   SpO2 98%   BMI 31.87 kg/m   Visual Acuity Right Eye Distance:   Left Eye Distance:   Bilateral Distance:    Right Eye Near:   Left Eye Near:    Bilateral Near:     Physical Exam Vitals and nursing note reviewed.  Constitutional:      General: He is not in acute distress.    Appearance: He is well-developed. He is not ill-appearing, toxic-appearing or diaphoretic.  HENT:     Head: Normocephalic and atraumatic.     Right Ear: Tympanic membrane and ear canal normal.     Left Ear: Tympanic membrane and ear canal normal.     Nose: Nose normal.     Mouth/Throat:     Lips: Pink.     Mouth: Mucous membranes are moist.     Pharynx: Oropharynx is clear. Uvula midline.  Eyes:     Extraocular Movements: Extraocular movements intact.     Pupils: Pupils are equal, round, and reactive to light.  Cardiovascular:     Rate and Rhythm: Normal rate and regular rhythm.  Pulmonary:     Effort: Pulmonary effort is normal. No respiratory distress.     Breath sounds: Normal breath sounds. No stridor. No wheezing, rhonchi or rales.  Abdominal:     General: There is no distension.     Palpations: Abdomen  is soft.     Tenderness: There is no abdominal tenderness.  Musculoskeletal:        General: Normal range of motion.     Cervical back: Normal range of motion and neck supple.  Skin:    General: Skin is warm and dry.  Neurological:     Mental Status: He is alert and oriented to person, place, and time.     GCS: GCS eye subscore is 4. GCS verbal subscore is 5. GCS motor subscore is 6.     Cranial Nerves: No cranial nerve deficit.  Psychiatric:        Mood and Affect: Mood normal.        Behavior: Behavior normal.      UC Treatments / Results  Labs (all labs ordered are listed, but only abnormal results are displayed) Labs Reviewed  POCT FASTING CBG [facility] MANUAL ENTRY - Abnormal; Notable for the following components:      Result Value   POCT Glucose ([facility]) 187 (*)    All other components within normal limits  GLUCOSE, RANDOM    EKG   Radiology No results found.  Procedures Procedures (including critical care time)  Medications Ordered in UC Medications  ketorolac (TORADOL) injection 60 mg (60 mg Intramuscular Given 08/29/19 1812)    Initial Impression / Assessment and Plan / UC Course  I have reviewed the triage vital signs and the nursing notes.  Pertinent labs & imaging results that were available during my care of the patient were reviewed by me and considered in my medical decision making (see chart for details).     Normal exam Hx and exam c/w immune response to 2nd covid vaccine. No evidence of emergent process taking place at  this time Encouraged symptomatic tx Pt given toradol 60mg  IM in UC HA improved from 7/10 to 4/10. Encouraged oral hydration and rest F/u with PCP Pt accompanied by his wife, both pt and wife verbalized understanding and agreement with tx plan. AVS provided  Final Clinical Impressions(s) / UC Diagnoses   Final diagnoses:  Weakness  Bad headache  Body aches     Discharge Instructions      Try to get plenty of rest, stay well  hydrated with at least eight 8oz glasses of water a day.  You may also eat/drink soup broth, popsicles, jell-o, ice chips.    Please follow up with your family doctor tomorrow if not improving.  Call 911 or go to the hospital if symptoms significantly worsening- you develop chest pain, trouble breathing, passing out, or other new concerning symptoms develop.     ED Prescriptions    None     PDMP not reviewed this encounter.   , Lurene Shadow 08/29/19 1936

## 2019-08-29 NOTE — ED Triage Notes (Signed)
Had pfizer brand covid vaccine. Was told by Dr.T via to take meloxicam for headache and other side effects.

## 2019-08-29 NOTE — ED Triage Notes (Signed)
Patient had covid in July, 2020 and was hospitalized for 9 days with severe complications. Yesterday he received the 2nd covid vaccination; since later in the evening he has had elevated temp; been shakey and weak and has severe headache; last OTC of tylenol was 4 hours ago.

## 2019-09-05 DIAGNOSIS — N201 Calculus of ureter: Secondary | ICD-10-CM | POA: Insufficient documentation

## 2019-09-06 IMAGING — DX CHEST - 2 VIEW
2 series · 2 of 2 positions shown · non-contrast
Comparison: 02/12/2016

CLINICAL DATA: Recent PIP3X-SO you have.

EXAM:
CHEST - 2 VIEW

[chest pa]
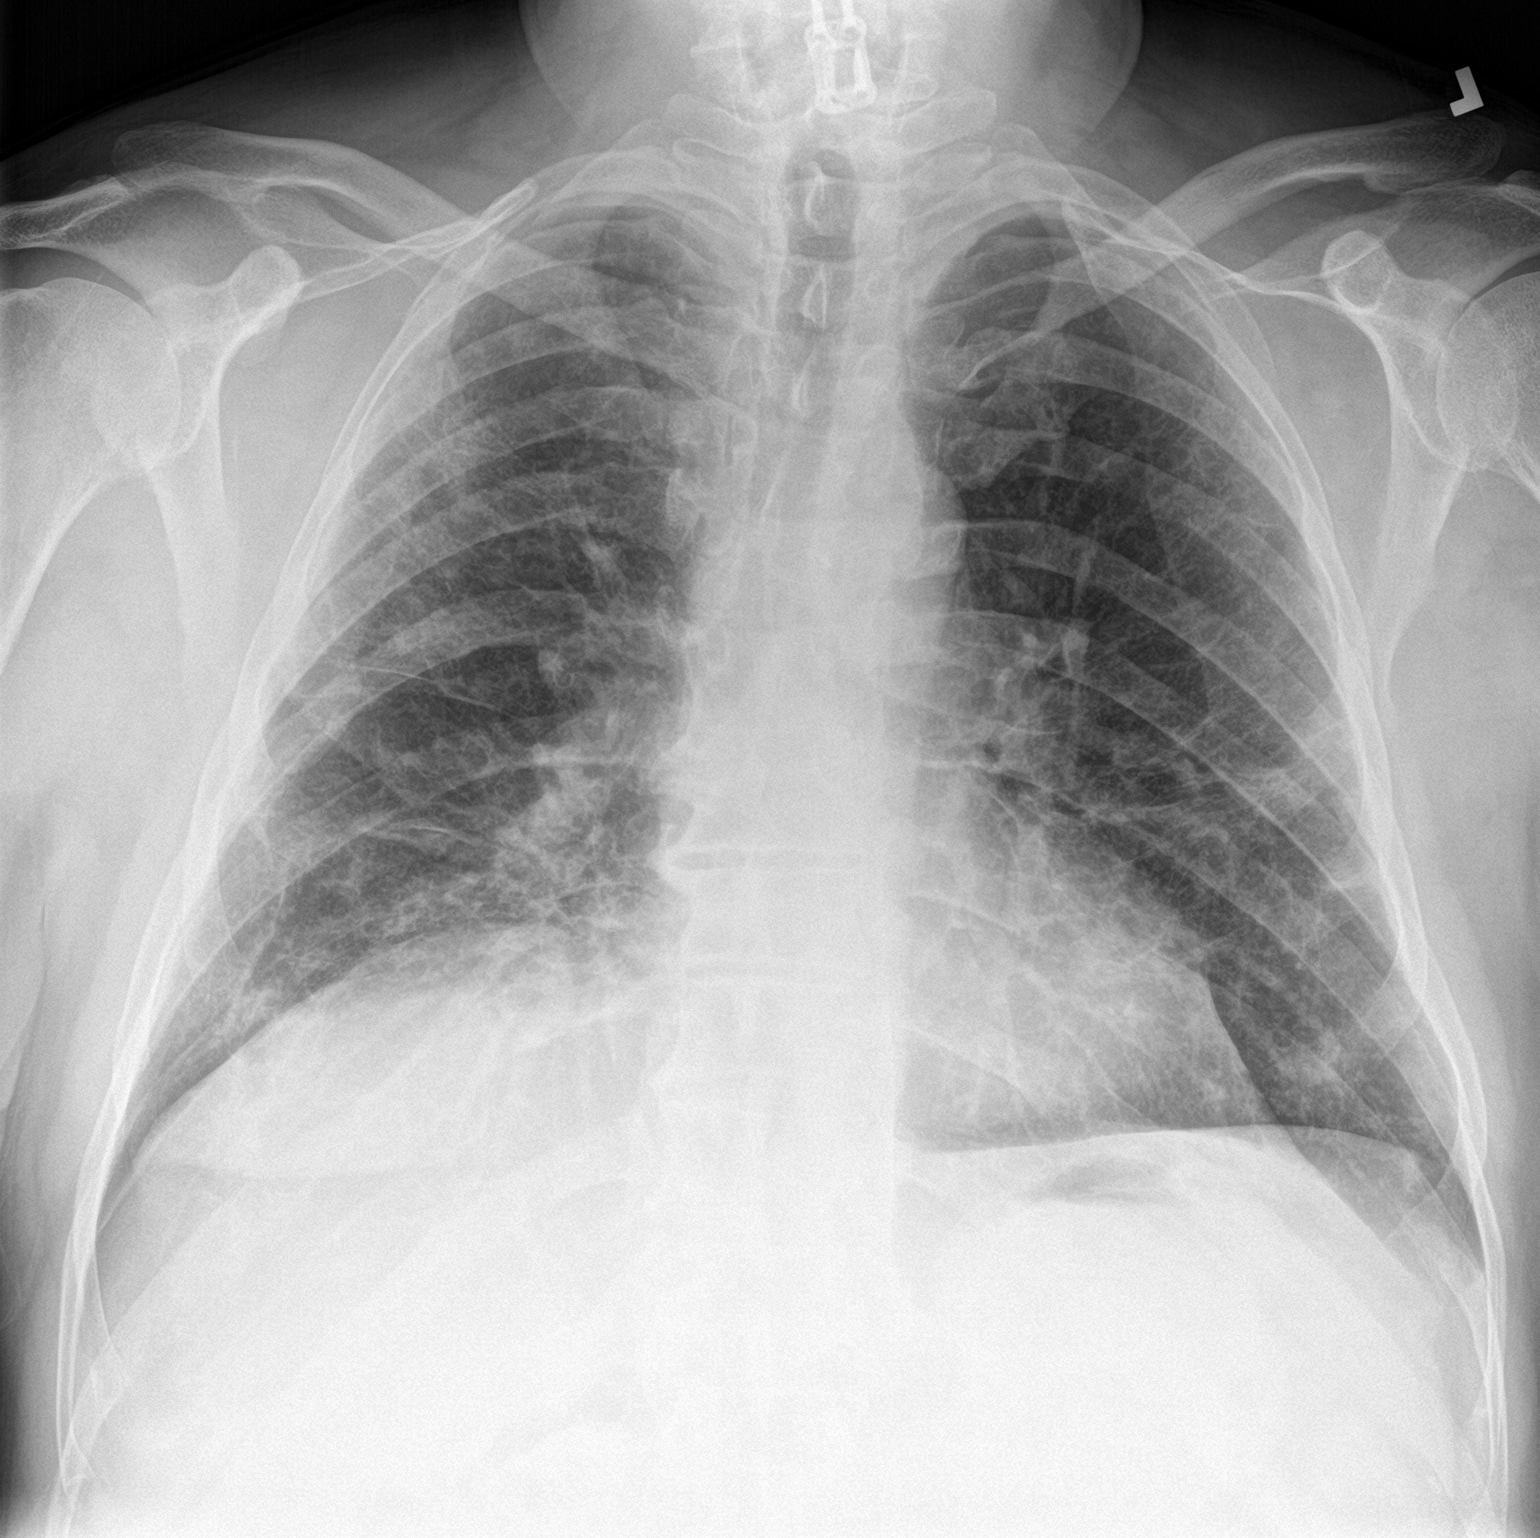

[chest lat]
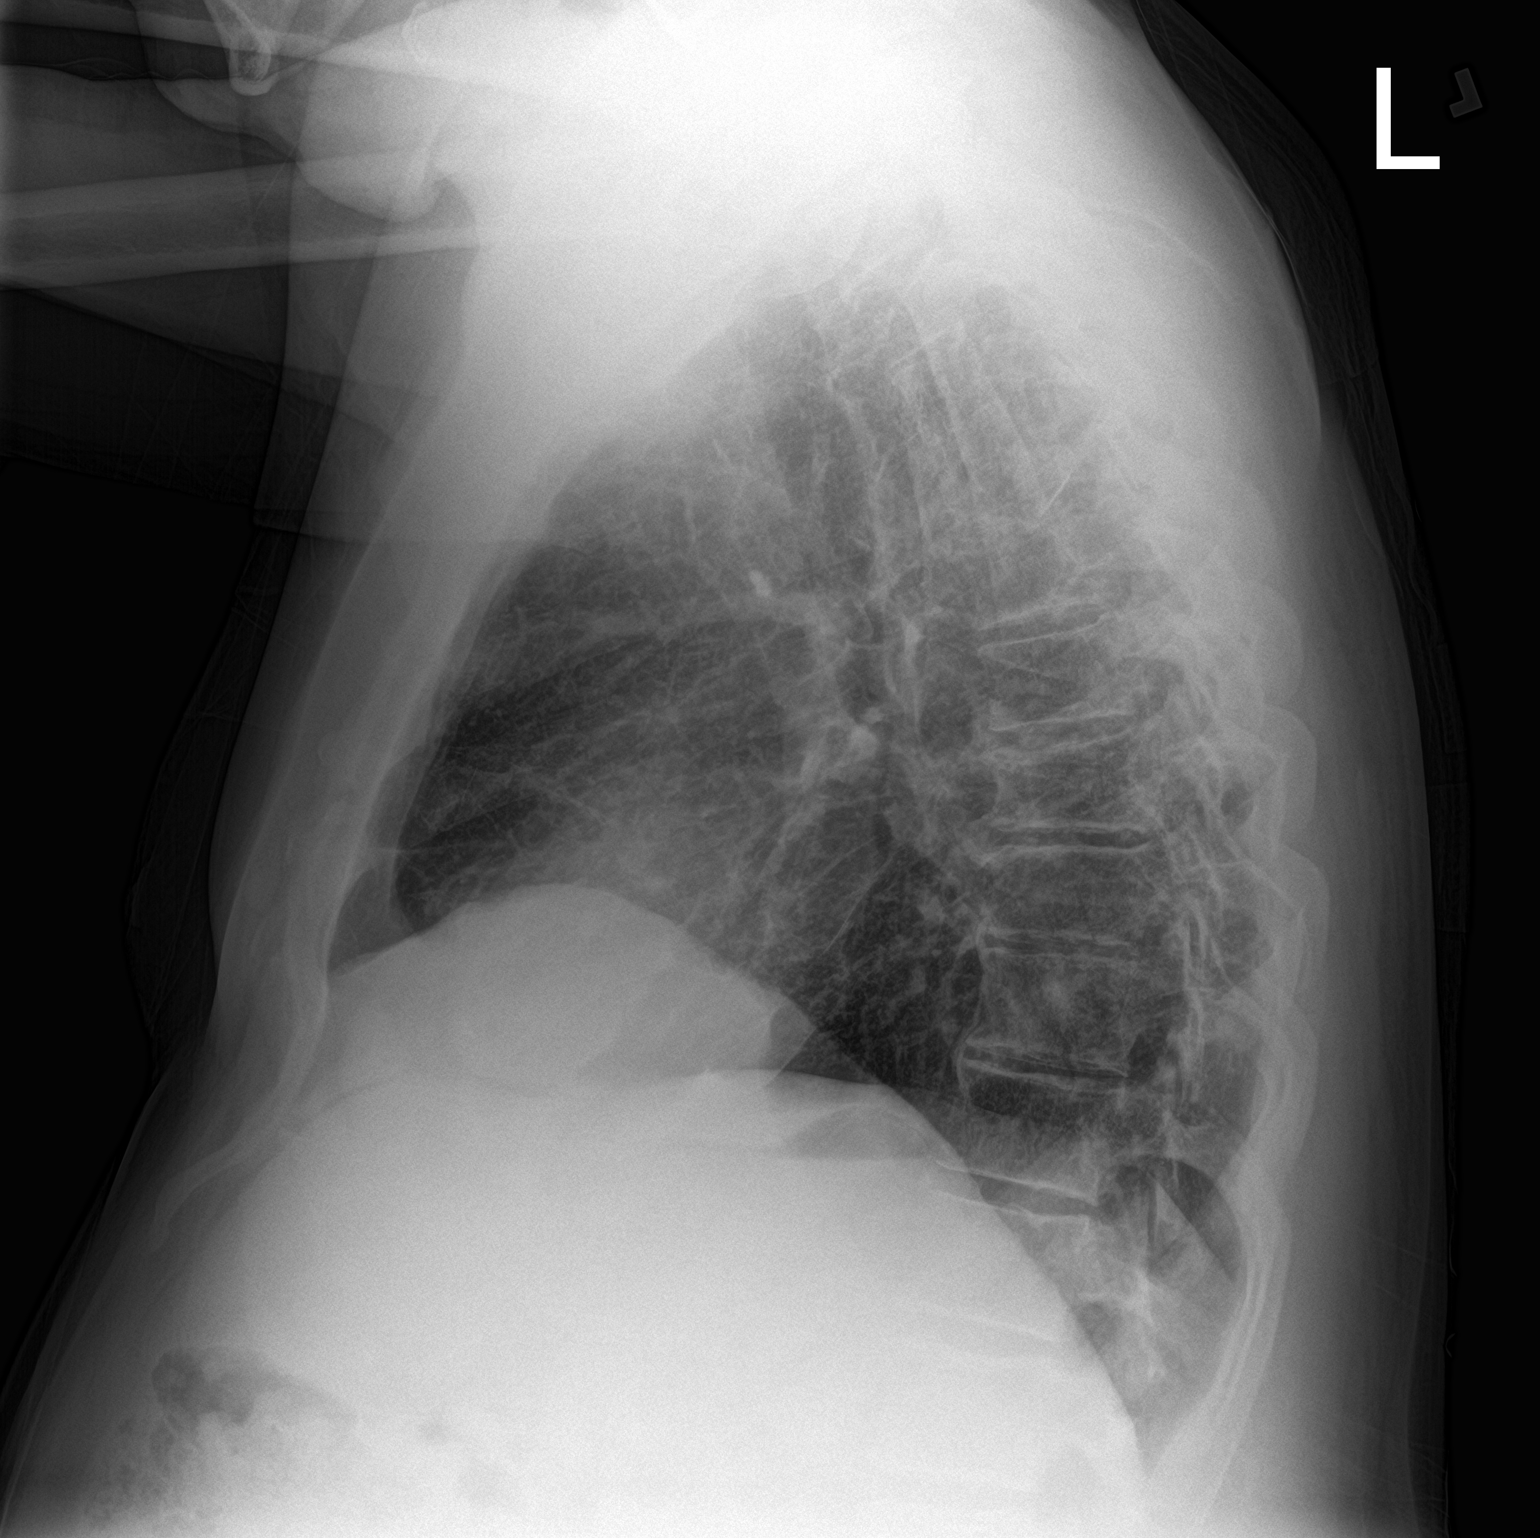

[2 of 2 positions shown; findings below may reference images not displayed]

FINDINGS: The heart size and mediastinal contours are within normal limits.
Mild airspace disease is seen in the peripheral lung zones
bilaterally, right side greater than left. No evidence of pleural
effusion.
IMPRESSION: Mild bilateral peripheral pulmonary airspace disease, right side
greater than left.

## 2019-09-17 DIAGNOSIS — D72829 Elevated white blood cell count, unspecified: Secondary | ICD-10-CM | POA: Insufficient documentation

## 2019-09-17 DIAGNOSIS — N390 Urinary tract infection, site not specified: Secondary | ICD-10-CM | POA: Insufficient documentation

## 2019-09-17 DIAGNOSIS — I10 Essential (primary) hypertension: Secondary | ICD-10-CM | POA: Insufficient documentation

## 2019-09-17 DIAGNOSIS — R509 Fever, unspecified: Secondary | ICD-10-CM | POA: Insufficient documentation

## 2019-10-04 ENCOUNTER — Emergency Department (INDEPENDENT_AMBULATORY_CARE_PROVIDER_SITE_OTHER)
Admission: EM | Admit: 2019-10-04 | Discharge: 2019-10-04 | Disposition: A | Discharge status: Home / Self Care | Payer: 59 | Source: Home / Self Care

## 2019-10-04 DIAGNOSIS — R300 Dysuria: Secondary | ICD-10-CM | POA: Diagnosis not present

## 2019-10-04 DIAGNOSIS — N200 Calculus of kidney: Secondary | ICD-10-CM

## 2019-10-04 LAB — POCT CBC W AUTO DIFF (K'VILLE URGENT CARE)

## 2019-10-04 LAB — POCT URINALYSIS DIP (MANUAL ENTRY)
Bilirubin, UA: NEGATIVE
Glucose, UA: 500 mg/dL — AB
Ketones, POC UA: NEGATIVE mg/dL
Nitrite, UA: NEGATIVE
Protein Ur, POC: 100 mg/dL — AB
Spec Grav, UA: 1.02 (ref 1.010–1.025)
Urobilinogen, UA: 0.2 E.U./dL
pH, UA: 5 (ref 5.0–8.0)

## 2019-10-04 MED ORDER — NITROFURANTOIN MONOHYD MACRO 100 MG PO CAPS: 100.0000 mg | ORAL_CAPSULE | Freq: Two times a day (BID) | ORAL | 0 refills | Status: AC

## 2019-10-04 NOTE — ED Triage Notes (Signed)
Pt c/o dysuria and fever x 2 days. Had stent put in 3/18 for kidney stones. UTI 2 weeks ago, oral antibiotics given. 100.3 temp last 24 hours. Tylenol prn. Scheduled for surgery 4/29. Last tylenol at 10am.

## 2019-10-04 NOTE — ED Provider Notes (Signed)
Ivar D[facility]    CSN: [id number] Arrival date & time: 10/04/19  1424      History   Chief Complaint Chief Complaint  Patient presents with  . Nephrolithiasis    HPI Robert Hines is a 64 y.o. male.   HPI DEACON GADBOIS is a 64 y.o. male presenting to UC with c/o 2 days of worsening dysuria and low grade fever, Tmax 100.3*F.  Pt had a ureteral stent placed by [facility] on 09/05/19 for a 5mm renal stone. He is scheduled for surgery on 4/29.  He completed a course of Augmentin about 1 week ago. Symptoms did resolve but it caused him to have bad diarrhea (per wife).  She is concerned he is getting another UTI.  Denies n/v/d.  Last dose of Tylenol was 10AM this morning.   Past Medical History:  Diagnosis Date  . Arthritis    neck  . COVID-19   . Depression   . Diabetes mellitus without complication (HCC)    type 2  . Hyperlipidemia   . Hypertension   . Pneumonia    covid pneumonia and covid july 2020, d/ced july 16th from [facility] stayed 9 days  . Renal disorder    calculi, multiple lithotripsies, stents  . Right cataract   . Rotator cuff disorder   . Spinal pain     Patient Active Problem List   Diagnosis Date Noted  . Transaminitis 08/16/2019  . Thrombocytopenia (HCC) 02/03/2019  . Pneumonia due to COVID-19 virus 12/20/2018  . Mass of left thigh 05/24/2018  . History of DVT (deep vein thrombosis) 05/09/2018  . Primary insomnia 10/30/2017  . S/P cervical spinal fusion 09/28/2017  . Left shoulder pain 01/05/2017  . Depression 05/02/2016  . Obesity 01/23/2015  . Right shoulder pain 01/23/2015  . Former smoker 08/28/2014  . Nephrolithiasis 07/25/2014  . Diabetes mellitus, type 2 (HCC) 07/25/2014  . Essential hypertension, benign 07/25/2014  . Annual physical exam 07/25/2014  . Hyperlipidemia 07/25/2014    Past Surgical History:  Procedure Laterality Date  . ANTERIOR FUSION CERVICAL SPINE  2007  . ARTHOSCOPIC ROTAOR CUFF REPAIR  Left 03/21/2019   Procedure: ARTHROSCOPIC ROTATOR CUFF REPAIR;  Surgeon: Jones Broom, MD;  Location: [facility];  Service: Orthopedics;  Laterality: Left;  . EYE SURGERY Left    cataract  . FOOT SURGERY Right    pin placed and removed  . HERNIA REPAIR     inguinal  . LITHOTRIPSY     several  . SHOULDER ARTHROSCOPY WITH ROTATOR CUFF REPAIR AND SUBACROMIAL DECOMPRESSION Right 12/28/2015   Procedure: SHOULDER ARTHROSCOPY TAKE DOWN ROTATOR CUFF REPAIR AND SUBACROMIAL DECOMPRESSION, DEBRIDEMENT OF SLAP TEAR;  Surgeon: Jones Broom, MD;  Location: [facility];  Service: Orthopedics;  Laterality: Right;  SHOULDER ARTHROSCOPY TAKE DOWN ROTATOR CUFF REPAIR AND SUBACROMIAL DECOMPRESSION  . SHOULDER ARTHROSCOPY WITH SUBACROMIAL DECOMPRESSION Left 03/21/2019   Procedure: SHOULDER ARTHROSCOPY WITH SUBACROMIAL DECOMPRESSION;  Surgeon: Jones Broom, MD;  Location: [facility];  Service: Orthopedics;  Laterality: Left;  [name] VASECTOMY Bilateral        Home Medications    Prior to Admission medications   Medication Sig Start Date End Date Taking? Authorizing Provider  aspirin 81 MG chewable tablet Chew by mouth daily.    [provider]  buPROPion (WELLBUTRIN XL) 300 MG 24 hr tablet TAKE 1 TABLET BY MOUTH DAILY 08/01/19   Monica Becton, MD  cyclobenzaprine (FLEXERIL) 10 MG tablet Take  10 mg by mouth 3 (three) times daily as needed for muscle spasms.    [provider]  Dulaglutide (TRULICITY) 1.5 MG/0.5ML SOPN Wednesday in pm 06/08/19   Monica Becton, MD  gabapentin (NEURONTIN) 300 MG capsule TAKE 3 CAPSULES BY MOUTH IN THE MORNING AND 4 CAPSULES IN THE EVENING 02/28/19   Monica Becton, MD  Insulin Glargine (BASAGLAR KWIKPEN) 100 UNIT/ML SOPN Inject 0.94 mLs (94 Units total) into the skin at bedtime. 06/17/19   Monica Becton, MD  Insulin Pen Needle 32G X 4 MM MISC Use as needed with toujeo pen 08/04/15    Monica Becton, MD  lisinopril (ZESTRIL) 20 MG tablet Take 1 tablet (20 mg total) by mouth daily. 02/28/19   Monica Becton, MD  nitrofurantoin, macrocrystal-monohydrate, (MACROBID) 100 MG capsule Take 1 capsule (100 mg total) by mouth 2 (two) times daily for 14 days. 10/04/19 10/18/19  Lurene Shadow, PA-C  simvastatin (ZOCOR) 40 MG tablet Take 1 tablet (40 mg total) by mouth daily. 02/28/19   Monica Becton, MD  traMADol (ULTRAM) 50 MG tablet TAKE 2 TABLETS (100 MG TOTAL) BY MOUTH 3 (THREE) TIMES DAILY AS NEEDED. 06/17/19   Monica Becton, MD  XIGDUO XR 03-999 MG TB24 TAKE 1 TABLET BY MOUTH DAILY 12/28/18   Monica Becton, MD  zolpidem (AMBIEN) 10 MG tablet TAKE 1 TABLET AT BEDTIME AS NEEDED SLEEP 06/17/19   Monica Becton, MD    Family History Family History  Problem Relation Age of Onset  . Hypertension Mother   . Heart failure Father   . Diabetes Father   . Hypertension Father   . Heart failure Sister   . Hypertension Sister     Social History Social History   Tobacco Use  . Smoking status: Former Smoker    Types: Cigarettes    Quit date: 10/01/2018    Years since quitting: 1.0  . Smokeless tobacco: Never Used  Substance Use Topics  . Alcohol use: Yes    Alcohol/week: 0.0 standard drinks    Comment: social  . Drug use: No     Allergies   Augmentin [amoxicillin-pot clavulanate], Codeine, and Duloxetine   Review of Systems Review of Systems  Constitutional: Positive for fatigue and fever. Negative for appetite change, chills and diaphoresis.  Gastrointestinal: Positive for abdominal pain (mild). Negative for diarrhea, nausea and vomiting.  Genitourinary: Positive for dysuria, flank pain, frequency, hematuria and urgency. Negative for decreased urine volume.  Musculoskeletal: Positive for back pain.  Neurological: Negative for dizziness, light-headedness and headaches.     Physical Exam Triage Vital Signs ED Triage  Vitals  Enc Vitals Group     BP 10/04/19 1437 127/72     Pulse Rate 10/04/19 1437 (!) 101     Resp --      Temp 10/04/19 1437 98.2 F (36.8 C)     Temp src --      SpO2 10/04/19 1437 98 %     Weight --      Height --      Head Circumference --      Peak Flow --      Pain Score 10/04/19 1442 7     Pain Loc --      Pain Edu? --      Excl. in [place]? --    No data found.  Updated Vital Signs BP 127/72 (BP Location: Left Arm)   Pulse (!) 101   Temp 98.2 F (  36.8 C)   SpO2 98%   Visual Acuity Right Eye Distance:   Left Eye Distance:   Bilateral Distance:    Right Eye Near:   Left Eye Near:    Bilateral Near:     Physical Exam Vitals and nursing note reviewed.  Constitutional:      General: He is not in acute distress.    Appearance: Normal appearance. He is well-developed. He is not ill-appearing, toxic-appearing or diaphoretic.  HENT:     Head: Normocephalic and atraumatic.     Mouth/Throat:     Mouth: Mucous membranes are moist.  Cardiovascular:     Rate and Rhythm: Normal rate and regular rhythm.  Pulmonary:     Effort: Pulmonary effort is normal. No respiratory distress.     Breath sounds: Normal breath sounds.  Abdominal:     Palpations: Abdomen is soft.     Tenderness: There is abdominal tenderness (mild, suprapubic). There is right CVA tenderness and left CVA tenderness.  Musculoskeletal:        General: Normal range of motion.     Cervical back: Normal range of motion.  Skin:    General: Skin is warm and dry.  Neurological:     Mental Status: He is alert and oriented to person, place, and time.  Psychiatric:        Behavior: Behavior normal.      UC Treatments / Results  Labs (all labs ordered are listed, but only abnormal results are displayed) Labs Reviewed  POCT URINALYSIS DIP (MANUAL ENTRY) - Abnormal; Notable for the following components:      Result Value   Clarity, UA cloudy (*)    Glucose, UA =500 (*)    Blood, UA moderate (*)     Protein Ur, POC =100 (*)    Leukocytes, UA Small (1+) (*)    All other components within normal limits  URINE CULTURE  BASIC METABOLIC PANEL  POCT CBC W AUTO DIFF ([facility])    EKG   Radiology No results found.  Procedures Procedures (including critical care time)  Medications Ordered in UC Medications - No data to display  Initial Impression / Assessment and Plan / UC Course  I have reviewed the triage vital signs and the nursing notes.  Pertinent labs & imaging results that were available during my care of the patient were reviewed by me and considered in my medical decision making (see chart for details).     UA c/w UTI Pt with known stones, stent in place, surgery scheduled with [facility] for 4/29 CBC: WBC- 12 Called on-call nurse, per on-call provider, Murrell Converse, PA-C, recommends starting pt on Macrobid for 14 days based on his last urine culture with them. New culture sent today from our UC Provider also recommended BMP be sent to lab. Pending.  Discussed symptoms that warrant emergent care in the ED. AVS provided. Wife and pt verbalized understanding and agreement with tx plan.  Final Clinical Impressions(s) / UC Diagnoses   Final diagnoses:  Dysuria  Nephrolithiasis     Discharge Instructions      After consulting with your urology clinic at [place], per Murrell Converse, PA-C, it is recommended you are started on 2 weeks of antibiotics to cover you until surgery.  If the culture comes back and a medication change is indicated, you will be notified and the new medication will be called into your preferred pharmacy.  If you spike a fever over 100.5*F, it is advised  you go to the hospital for further evaluation and treatment as you will likely need IV antibiotics at that time, which are not available in this urgent care.  In the meantime, try to avoid drinking sodas and increase your water intake.     ED Prescriptions    Medication Sig  Dispense Auth. Provider   nitrofurantoin, macrocrystal-monohydrate, (MACROBID) 100 MG capsule Take 1 capsule (100 mg total) by mouth 2 (two) times daily for 14 days. 28 capsule Lurene Shadow, [place]     PDMP not reviewed this encounter.   Lurene Shadow, [place] 10/04/19 1636

## 2019-10-04 NOTE — Discharge Instructions (Signed)
  After consulting with your urology clinic at [place], per Pete Glatter, PA-C, it is recommended you are started on 2 weeks of antibiotics to cover you until surgery.  If the culture comes back and a medication change is indicated, you will be notified and the new medication will be called into your preferred pharmacy.  If you spike a fever over 100.5*F, it is advised you go to the hospital for further evaluation and treatment as you will likely need IV antibiotics at that time, which are not available in this urgent care.  In the meantime, try to avoid drinking sodas and increase your water intake.

## 2019-10-05 LAB — BASIC METABOLIC PANEL
BUN/Creatinine Ratio: 16 (calc) (ref 6–22)
BUN: 21 mg/dL (ref 7–25)
CO2: 26 mmol/L (ref 20–32)
Calcium: 9.6 mg/dL (ref 8.6–10.3)
Chloride: 99 mmol/L (ref 98–110)
Creat: 1.3 mg/dL — ABNORMAL HIGH (ref 0.70–1.25)
Glucose, Bld: 204 mg/dL — ABNORMAL HIGH (ref 65–99)
Potassium: 4.4 mmol/L (ref 3.5–5.3)
Sodium: 138 mmol/L (ref 135–146)

## 2019-10-06 LAB — URINE CULTURE
MICRO NUMBER:: 10373344
SPECIMEN QUALITY:: ADEQUATE

## 2019-10-21 ENCOUNTER — Other Ambulatory Visit: Payer: Self-pay | Admitting: Sports Medicine

## 2019-11-07 ENCOUNTER — Other Ambulatory Visit: Payer: Self-pay | Admitting: Sports Medicine

## 2019-11-07 DIAGNOSIS — E119 Type 2 diabetes mellitus without complications: Secondary | ICD-10-CM

## 2019-11-08 ENCOUNTER — Telehealth: Payer: Self-pay | Admitting: Sports Medicine

## 2019-11-08 NOTE — Telephone Encounter (Signed)
Received fax form for Trulicity filled out received provider's signature and faxed to [facility] waiting on determination. - CF

## 2019-11-11 ENCOUNTER — Other Ambulatory Visit: Payer: Self-pay | Admitting: *Deleted

## 2019-11-11 DIAGNOSIS — E119 Type 2 diabetes mellitus without complications: Secondary | ICD-10-CM

## 2019-11-11 MED ORDER — TRULICITY 1.5 MG/0.5ML ~~LOC~~ SOAJ: SUBCUTANEOUS | 3 refills | Status: DC

## 2019-11-11 NOTE — Telephone Encounter (Signed)
Received fax from [place] they stated trulicity has already been approved and has an authorization on file. - CF

## 2020-01-28 ENCOUNTER — Other Ambulatory Visit: Payer: Self-pay | Admitting: Sports Medicine

## 2020-01-28 DIAGNOSIS — I10 Essential (primary) hypertension: Secondary | ICD-10-CM

## 2020-02-12 ENCOUNTER — Encounter: Payer: Self-pay | Admitting: Sports Medicine

## 2020-02-12 ENCOUNTER — Other Ambulatory Visit: Payer: Self-pay

## 2020-02-12 ENCOUNTER — Ambulatory Visit (INDEPENDENT_AMBULATORY_CARE_PROVIDER_SITE_OTHER): Payer: 59 | Admitting: Sports Medicine

## 2020-02-12 VITALS — BP 103/65 | HR 105 | Ht 72.0 in | Wt 229.0 lb

## 2020-02-12 DIAGNOSIS — Z794 Long term (current) use of insulin: Secondary | ICD-10-CM

## 2020-02-12 DIAGNOSIS — E119 Type 2 diabetes mellitus without complications: Secondary | ICD-10-CM | POA: Diagnosis not present

## 2020-02-12 LAB — POCT GLYCOSYLATED HEMOGLOBIN (HGB A1C): Hemoglobin A1C: 6.6 % — AB (ref 4.0–5.6)

## 2020-02-12 NOTE — Assessment & Plan Note (Signed)
Robert Hines is a very pleasant 64 year old male, his diabetes is now well controlled, he is up-to-date on screening measures. He retires in about 5 months and is very excited about this, no changes in plan, return in 6 months for repeat diabetes check.

## 2020-02-12 NOTE — Progress Notes (Signed)
    Procedures performed today:    None.  Independent interpretation of notes and tests performed by another provider:   None.  Brief History, Exam, Impression, and Recommendations:    Diabetes mellitus, type 2 Robert Hines is a very pleasant 64 year old male, his diabetes is now well controlled, he is up-to-date on screening measures. He retires in about 5 months and is very excited about this, no changes in plan, return in 6 months for repeat diabetes check.    ___________________________________________ Ihor Austin. Benjamin Stain, M.D., ABFM., CAQSM. Primary Care and Sports Medicine [facility]  Adjunct Instructor of Family Medicine  [employer]

## 2020-04-27 ENCOUNTER — Encounter: Payer: Self-pay | Admitting: Physician Assistant

## 2020-04-27 ENCOUNTER — Telehealth (INDEPENDENT_AMBULATORY_CARE_PROVIDER_SITE_OTHER): Payer: 59 | Admitting: Physician Assistant

## 2020-04-27 VITALS — Temp 98.7°F | Ht 72.0 in | Wt 236.0 lb

## 2020-04-27 DIAGNOSIS — H9202 Otalgia, left ear: Secondary | ICD-10-CM

## 2020-04-27 DIAGNOSIS — J029 Acute pharyngitis, unspecified: Secondary | ICD-10-CM | POA: Diagnosis not present

## 2020-04-27 DIAGNOSIS — W19XXXS Unspecified fall, sequela: Secondary | ICD-10-CM

## 2020-04-27 DIAGNOSIS — R6884 Jaw pain: Secondary | ICD-10-CM | POA: Diagnosis not present

## 2020-04-27 MED ORDER — OFLOXACIN 0.3 % OT SOLN: 10.0000 [drp] | Freq: Every day | OTIC | 0 refills | Status: DC

## 2020-04-27 NOTE — Progress Notes (Signed)
Started Saturday: Ear pain (left)  Sore throat (left side) Some congestion/drainage  Fell last Wednesday, hit chin hard, thought it might be related  They did take a picture of the inside of the ear and sent to [place] via mychart  No fever, body aches, chills, cough No diarrhea, nausea, vomiting  Taking advil but no other OTC meds

## 2020-04-27 NOTE — Progress Notes (Signed)
Patient ID: Robert Hines, male   DOB: May 14, 1956, 64 y.o.   MRN: [id number] .Marland KitchenVirtual Visit via Telephone Note  I connected with [name] on 04/27/20 at  1:40 PM EST by telephone and verified that I am speaking with the correct person using two identifiers.  Location: Patient: home Provider: clinic   I discussed the limitations, risks, security and privacy concerns of performing an evaluation and management service by telephone and the availability of in person appointments. I also discussed with the patient that there may be a patient responsible charge related to this service. The patient expressed understanding and agreed to proceed.   History of Present Illness: Pt is a 64 yo male who calls into the clinic with left ear pain and ST with some congestion and drainage since Saturday 3 days. He notes he fell and hit his chin at work on Wednesday of last week 5 days ago. He has been seen for that for workmans comp. No fractures. He does have some continual jaw pain. Denies any HA. He left ear started hurting satuday. He denies and ear discharge. No fever, chills, cough SOB, body aches GI symptoms. No other sick contacts.  advil does help. He used some hydrogen peroxide in ear to help with wax.  Daughter took picture of ear and sent via mychart.  His wife got a wrist cuff for BP and checked BP and getting 170 over 88. No headaches vision changes or dizziness. Check at clinic on Saturday and was 130s on top.  .. Active Ambulatory Problems    Diagnosis Date Noted  . Nephrolithiasis 07/25/2014  . Diabetes mellitus, type 2 (HCC) 07/25/2014  . Essential hypertension, benign 07/25/2014  . Annual physical exam 07/25/2014  . Hyperlipidemia 07/25/2014  . Former smoker 08/28/2014  . Obesity 01/23/2015  . Right shoulder pain 01/23/2015  . Depression 05/02/2016  . Left shoulder pain 01/05/2017  . S/P cervical spinal fusion 09/28/2017  . Primary insomnia 10/30/2017  . History of DVT (deep  vein thrombosis) 05/09/2018  . Mass of left thigh 05/24/2018  . Pneumonia due to COVID-19 virus 12/20/2018  . Thrombocytopenia (HCC) 02/03/2019  . Transaminitis 08/16/2019   Resolved Ambulatory Problems    Diagnosis Date Noted  . Onychomycosis of right great toe 03/03/2015  . Skin tag 03/03/2015  . Paronychia of great toe, left 08/04/2015  . Acute bronchitis 02/12/2016  . Ingrown right big toenail 02/16/2016  . Skin rash 02/23/2016  . Superficial venous thrombosis of right arm 01/05/2017  . Cold sore 04/26/2018  . Pain and swelling of left lower extremity 05/09/2018  . Chronic right shoulder pain 12/19/2018   Past Medical History:  Diagnosis Date  . Arthritis   . COVID-19   . Diabetes mellitus without complication (HCC)   . Hypertension   . Pneumonia   . Renal disorder   . Right cataract   . Rotator cuff disorder   . Spinal pain    Reviewed med, allergy, problem list.     Observations/Objective: No acute distress  Normal mood Normal breathing Via picture TM clear without bulging or dull TM. Erythematous canal with cerumen from 12 oclock to 5 oclock.  Per patient no pain over TMJ. Some discomfort when pulling up on ears.  .. Today's Vitals   04/27/20 1048  Temp: 98.7 F (37.1 C)  TempSrc: Oral  Weight: 236 lb (107 kg)  Height: 6' (1.829 m)   Body mass index is 32.01 kg/m.    Assessment and Plan: Marland KitchenMarland Kitchen  [name] was seen today for ear pain and sore throat.  Diagnoses and all orders for this visit:  Left ear pain -     ofloxacin (FLOXIN) 0.3 % OTIC solution; Place 10 drops into the left ear daily. For 7 days.  Fall, sequela  Jaw pain  Sore throat   Does not sound like covid symptoms. Can be checked in office if pain continues. Certainly have fall could have inflamed TMJ and caused jaw and ear pain. Picture of his ear shows normal TM but erythematous canal and cerumen accumulation. Does not appear like middle ear infection at this point.  Start ofloxaxin and  tylenol cold sinus severe to help post nasal drip. Pt does not like nasal sprays or would consider flonase. Ice jaw. If not improving or changing let us know and could bring him in as I do not think this is covid.   Keep checking BP but can get higher readings in wrist cuff. If developing high blood pressure signs and symptoms come if for offical check.    Follow Up Instructions:    I discussed the assessment and treatment plan with the patient. The patient was provided an opportunity to ask questions and all were answered. The patient agreed with the plan and demonstrated an understanding of the instructions.   The patient was advised to call back or seek an in-person evaluation if the symptoms worsen or if the condition fails to improve as anticipated.  I provided 10 minutes of non-face-to-face time during this encounter.   Tandy Gaw, PA-C

## 2020-04-29 ENCOUNTER — Other Ambulatory Visit: Payer: Self-pay | Admitting: Sports Medicine

## 2020-04-29 DIAGNOSIS — I10 Essential (primary) hypertension: Secondary | ICD-10-CM

## 2020-05-05 ENCOUNTER — Other Ambulatory Visit: Payer: Self-pay | Admitting: Sports Medicine

## 2020-05-12 ENCOUNTER — Encounter: Payer: Self-pay | Admitting: Physician Assistant

## 2020-05-12 ENCOUNTER — Ambulatory Visit (INDEPENDENT_AMBULATORY_CARE_PROVIDER_SITE_OTHER): Payer: 59 | Admitting: Physician Assistant

## 2020-05-12 VITALS — BP 147/76 | HR 91 | Temp 98.1°F | Ht 72.0 in | Wt 240.0 lb

## 2020-05-12 DIAGNOSIS — H6982 Other specified disorders of Eustachian tube, left ear: Secondary | ICD-10-CM

## 2020-05-12 DIAGNOSIS — J0100 Acute maxillary sinusitis, unspecified: Secondary | ICD-10-CM | POA: Diagnosis not present

## 2020-05-12 MED ORDER — METHYLPREDNISOLONE 4 MG PO TBPK: ORAL_TABLET | ORAL | 0 refills | Status: DC

## 2020-05-12 MED ORDER — CEFDINIR 300 MG PO CAPS: 300.0000 mg | ORAL_CAPSULE | Freq: Two times a day (BID) | ORAL | 0 refills | Status: DC

## 2020-05-12 NOTE — Progress Notes (Signed)
Subjective:    Patient ID: Robert Hines, male    DOB: [birth year], 64 y.o.   MRN: [id number]  HPI  Patient is a 64 year old male who presents to the clinic to follow-up on left ear pain. He was seen virtually on 04/27/2020. He was given ofloxacin for external ear infection. Patient reports it did help minimally. Symptoms have then came back. He continues to have left ear pain, sinus congestion and pressure, sinus drainage, left neck lymph node swelling. He has felt like he has had an under lying cold for the last few weeks. He is taken some over-the-counter medications with little benefit overall. He denies any fever, chills, body aches, loss of smell or taste, shortness of breath, cough.  He continues to have elevated blood pressure readings. Denies any headaches, palpitations, chest pain, dizziness.   .. Active Ambulatory Problems    Diagnosis Date Noted  . Nephrolithiasis 07/25/2014  . Diabetes mellitus, type 2 (HCC) 07/25/2014  . Essential hypertension, benign 07/25/2014  . Annual physical exam 07/25/2014  . Hyperlipidemia 07/25/2014  . Former smoker 08/28/2014  . Obesity 01/23/2015  . Right shoulder pain 01/23/2015  . Depression 05/02/2016  . Left shoulder pain 01/05/2017  . S/P cervical spinal fusion 09/28/2017  . Primary insomnia 10/30/2017  . History of DVT (deep vein thrombosis) 05/09/2018  . Mass of left thigh 05/24/2018  . Pneumonia due to COVID-19 virus 12/20/2018  . Thrombocytopenia (HCC) 02/03/2019  . Transaminitis 08/16/2019   Resolved Ambulatory Problems    Diagnosis Date Noted  . Onychomycosis of right great toe 03/03/2015  . Skin tag 03/03/2015  . Paronychia of great toe, left 08/04/2015  . Acute bronchitis 02/12/2016  . Ingrown right big toenail 02/16/2016  . Skin rash 02/23/2016  . Superficial venous thrombosis of right arm 01/05/2017  . Cold sore 04/26/2018  . Pain and swelling of left lower extremity 05/09/2018  . Chronic right shoulder pain  12/19/2018   Past Medical History:  Diagnosis Date  . Arthritis   . COVID-19   . Diabetes mellitus without complication (HCC)   . Hypertension   . Pneumonia   . Renal disorder   . Right cataract   . Rotator cuff disorder   . Spinal pain        Review of Systems  All other systems reviewed and are negative.      Objective:   Physical Exam Vitals reviewed.  Constitutional:      Appearance: He is well-developed.  HENT:     Head: Normocephalic.     Right Ear: Tympanic membrane and ear canal normal. No drainage or tenderness. No middle ear effusion. Tympanic membrane is not erythematous.     Left Ear: Ear canal normal. No drainage, swelling or tenderness.  No middle ear effusion. Tympanic membrane is not erythematous.     Ears:     Comments: Slightly retracted left TM with scant cerumen.  No tenderness over tragus or pulling up on auricle.     Mouth/Throat:     Mouth: Mucous membranes are moist. No oral lesions.     Pharynx: No oropharyngeal exudate, posterior oropharyngeal erythema or uvula swelling.     Tonsils: No tonsillar exudate or tonsillar abscesses.  Eyes:     Conjunctiva/sclera: Conjunctivae normal.     Pupils: Pupils are equal, round, and reactive to light.  Neck:     Comments: Left sided cervical adenopathy Cardiovascular:     Rate and Rhythm: Normal rate and regular rhythm.  Heart sounds: Normal heart sounds.  Pulmonary:     Effort: Pulmonary effort is normal.     Breath sounds: Normal breath sounds.  Lymphadenopathy:     Cervical: Cervical adenopathy present.  Neurological:     General: No focal deficit present.     Mental Status: He is alert and oriented to person, place, and time.  Psychiatric:        Mood and Affect: Mood normal.           Assessment & Plan:  Marland KitchenMarland KitchenRavis was seen today for ear pain and sore throat.  Diagnoses and all orders for this visit:  Acute non-recurrent maxillary sinusitis -     cefdinir (OMNICEF) 300 MG capsule;  Take 1 capsule (300 mg total) by mouth 2 (two) times daily. -     methylPREDNISolone (MEDROL DOSEPAK) 4 MG TBPK tablet; Take as directed by package insert.  Dysfunction of left eustachian tube -     methylPREDNISolone (MEDROL DOSEPAK) 4 MG TBPK tablet; Take as directed by package insert.   Reassured patient I do not see any signs of external ear infection today. I do see some signs of retraction and some air bubbles behind left TM. Suspect this represents more of a eustachian tube dysfunction in combination with the sinusitis. Patient has some left cervical anterior lymphadenopathy and he does report blowing out purulent sputum. I will treat today with Omnicef due to penicillin intolerance. He did only have diarrhea with penicillin. Discussed that all antibiotics come with some loose stools. Encourage probiotics and eating yogurt. Added Medrol Dosepak because patient cannot take Flonase. This should help with the inflammation and the pressure buildup. Follow-up as needed or symptoms worsen.  Follow up with PCP to discuss elevated blood pressure readings.

## 2020-05-12 NOTE — Patient Instructions (Signed)
Sinusitis, Adult Sinusitis is soreness and swelling (inflammation) of your sinuses. Sinuses are hollow spaces in the bones around your face. They are located:  Around your eyes.  In the middle of your forehead.  Behind your nose.  In your cheekbones. Your sinuses and nasal passages are lined with a fluid called mucus. Mucus drains out of your sinuses. Swelling can trap mucus in your sinuses. This lets germs (bacteria, virus, or fungus) grow, which leads to infection. Most of the time, this condition is caused by a virus. What are the causes? This condition is caused by:  Allergies.  Asthma.  Germs.  Things that block your nose or sinuses.  Growths in the nose (nasal polyps).  Chemicals or irritants in the air.  Fungus (rare). What increases the risk? You are more likely to develop this condition if:  You have a weak body defense system (immune system).  You do a lot of swimming or diving.  You use nasal sprays too much.  You smoke. What are the signs or symptoms? The main symptoms of this condition are pain and a feeling of pressure around the sinuses. Other symptoms include:  Stuffy nose (congestion).  Runny nose (drainage).  Swelling and warmth in the sinuses.  Headache.  Toothache.  A cough that may get worse at night.  Mucus that collects in the throat or the back of the nose (postnasal drip).  Being unable to smell and taste.  Being very tired (fatigue).  A fever.  Sore throat.  Bad breath. How is this diagnosed? This condition is diagnosed based on:  Your symptoms.  Your medical history.  A physical exam.  Tests to find out if your condition is short-term (acute) or long-term (chronic). Your doctor may: ? Check your nose for growths (polyps). ? Check your sinuses using a tool that has a light (endoscope). ? Check for allergies or germs. ? Do imaging tests, such as an MRI or CT scan. How is this treated? Treatment for this condition  depends on the cause and whether it is short-term or long-term.  If caused by a virus, your symptoms should go away on their own within 10 days. You may be given medicines to relieve symptoms. They include: ? Medicines that shrink swollen tissue in the nose. ? Medicines that treat allergies (antihistamines). ? A spray that treats swelling of the nostrils. ? Rinses that help get rid of thick mucus in your nose (nasal saline washes).  If caused by bacteria, your doctor may wait to see if you will get better without treatment. You may be given antibiotic medicine if you have: ? A very bad infection. ? A weak body defense system.  If caused by growths in the nose, you may need to have surgery. Follow these instructions at home: Medicines  Take, use, or apply over-the-counter and prescription medicines only as told by your doctor. These may include nasal sprays.  If you were prescribed an antibiotic medicine, take it as told by your doctor. Do not stop taking the antibiotic even if you start to feel better. Hydrate and humidify   Drink enough water to keep your pee (urine) pale yellow.  Use a cool mist humidifier to keep the humidity level in your home above 50%.  Breathe in steam for 10-15 minutes, 3-4 times a day, or as told by your doctor. You can do this in the bathroom while a hot shower is running.  Try not to spend time in cool or dry air.   Rest  Rest as much as you can.  Sleep with your head raised (elevated).  Make sure you get enough sleep each night. General instructions   Put a warm, moist washcloth on your face 3-4 times a day, or as often as told by your doctor. This will help with discomfort.  Wash your hands often with soap and water. If there is no soap and water, use hand sanitizer.  Do not smoke. Avoid being around people who are smoking (secondhand smoke).  Keep all follow-up visits as told by your doctor. This is important. Contact a doctor if:  You  have a fever.  Your symptoms get worse.  Your symptoms do not get better within 10 days. Get help right away if:  You have a very bad headache.  You cannot stop throwing up (vomiting).  You have very bad pain or swelling around your face or eyes.  You have trouble seeing.  You feel confused.  Your neck is stiff.  You have trouble breathing. Summary  Sinusitis is swelling of your sinuses. Sinuses are hollow spaces in the bones around your face.  This condition is caused by tissues in your nose that become inflamed or swollen. This traps germs. These can lead to infection.  If you were prescribed an antibiotic medicine, take it as told by your doctor. Do not stop taking it even if you start to feel better.  Keep all follow-up visits as told by your doctor. This is important. This information is not intended to replace advice given to you by your health care provider. Make sure you discuss any questions you have with your health care provider. Document Revised: 11/06/2017 Document Reviewed: 11/06/2017 Elsevier Patient Education  2020 [employer] Eustachian Tube Dysfunction  Eustachian tube dysfunction refers to a condition in which a blockage develops in the narrow passage that connects the middle ear to the back of the nose (eustachian tube). The eustachian tube regulates air pressure in the middle ear by letting air move between the ear and nose. It also helps to drain fluid from the middle ear space. Eustachian tube dysfunction can affect one or both ears. When the eustachian tube does not function properly, air pressure, fluid, or both can build up in the middle ear. What are the causes? This condition occurs when the eustachian tube becomes blocked or cannot open normally. Common causes of this condition include:  Ear infections.  Colds and other infections that affect the nose, mouth, and throat (upper respiratory tract).  Allergies.  Irritation from cigarette  smoke.  Irritation from stomach acid coming up into the esophagus (gastroesophageal reflux). The esophagus is the tube that carries food from the mouth to the stomach.  Sudden changes in air pressure, such as from descending in an airplane or scuba diving.  Abnormal growths in the nose or throat, such as: ? Growths that line the nose (nasal polyps). ? Abnormal growth of cells (tumors). ? Enlarged tissue at the back of the throat (adenoids). What increases the risk? You are more likely to develop this condition if:  You smoke.  You are overweight.  You are a child who has: ? Certain birth defects of the mouth, such as cleft palate. ? Large tonsils or adenoids. What are the signs or symptoms? Common symptoms of this condition include:  A feeling of fullness in the ear.  Ear pain.  Clicking or popping noises in the ear.  Ringing in the ear.  Hearing loss.  Loss of balance.    Dizziness. Symptoms may get worse when the air pressure around you changes, such as when you travel to an area of high elevation, fly on an airplane, or go scuba diving. How is this diagnosed? This condition may be diagnosed based on:  Your symptoms.  A physical exam of your ears, nose, and throat.  Tests, such as those that measure: ? The movement of your eardrum (tympanogram). ? Your hearing (audiometry). How is this treated? Treatment depends on the cause and severity of your condition.  In mild cases, you may relieve your symptoms by moving air into your ears. This is called "popping the ears."  In more severe cases, or if you have symptoms of fluid in your ears, treatment may include: ? Medicines to relieve congestion (decongestants). ? Medicines that treat allergies (antihistamines). ? Nasal sprays or ear drops that contain medicines that reduce swelling (steroids). ? A procedure to drain the fluid in your eardrum (myringotomy). In this procedure, a small tube is placed in the eardrum  to:  Drain the fluid.  Restore the air in the middle ear space. ? A procedure to insert a balloon device through the nose to inflate the opening of the eustachian tube (balloon dilation). Follow these instructions at home: Lifestyle  Do not do any of the following until your health care provider approves: ? Travel to high altitudes. ? Fly in airplanes. ? Work in a pressurized cabin or room. ? Scuba dive.  Do not use any products that contain nicotine or tobacco, such as cigarettes and e-cigarettes. If you need help quitting, ask your health care provider.  Keep your ears dry. Wear fitted earplugs during showering and bathing. Dry your ears completely after. General instructions  Take over-the-counter and prescription medicines only as told by your health care provider.  Use techniques to help pop your ears as recommended by your health care provider. These may include: ? Chewing gum. ? Yawning. ? Frequent, forceful swallowing. ? Closing your mouth, holding your nose closed, and gently blowing as if you are trying to blow air out of your nose.  Keep all follow-up visits as told by your health care provider. This is important. Contact a health care provider if:  Your symptoms do not go away after treatment.  Your symptoms come back after treatment.  You are unable to pop your ears.  You have: ? A fever. ? Pain in your ear. ? Pain in your head or neck. ? Fluid draining from your ear.  Your hearing suddenly changes.  You become very dizzy.  You lose your balance. Summary  Eustachian tube dysfunction refers to a condition in which a blockage develops in the eustachian tube.  It can be caused by ear infections, allergies, inhaled irritants, or abnormal growths in the nose or throat.  Symptoms include ear pain, hearing loss, or ringing in the ears.  Mild cases are treated with maneuvers to unblock the ears, such as yawning or ear popping.  Severe cases are treated  with medicines. Surgery may also be done (rare). This information is not intended to replace advice given to you by your health care provider. Make sure you discuss any questions you have with your health care provider. Document Revised: 09/26/2017 Document Reviewed: 09/26/2017 Elsevier Patient Education  2020 [employer]  

## 2020-06-26 ENCOUNTER — Other Ambulatory Visit: Payer: Self-pay | Admitting: Sports Medicine

## 2020-06-26 DIAGNOSIS — E119 Type 2 diabetes mellitus without complications: Secondary | ICD-10-CM

## 2020-07-11 ENCOUNTER — Encounter: Payer: Self-pay | Admitting: Emergency Medicine

## 2020-07-11 ENCOUNTER — Emergency Department
Admission: EM | Admit: 2020-07-11 | Discharge: 2020-07-11 | Disposition: A | Discharge status: Home / Self Care | Payer: 59 | Source: Home / Self Care

## 2020-07-11 ENCOUNTER — Other Ambulatory Visit: Payer: Self-pay

## 2020-07-11 DIAGNOSIS — Z20822 Contact with and (suspected) exposure to covid-19: Secondary | ICD-10-CM

## 2020-07-11 DIAGNOSIS — J069 Acute upper respiratory infection, unspecified: Secondary | ICD-10-CM | POA: Diagnosis not present

## 2020-07-11 NOTE — ED Triage Notes (Signed)
Patient c/o nasal congestion, non-productive cough, no ear pain, headache.  Exposed to a friend last Saturday who tested positive.  Patient was tested yesterday with rapid test that was negative.  Patient has been vaccinated and but no booster.

## 2020-07-11 NOTE — ED Provider Notes (Signed)
[facility]    CSN: [id number] Arrival date & time: 07/11/20  1027      History   Chief Complaint Chief Complaint  Patient presents with  . Nasal Congestion    HPI Robert Hines is a 65 y.o. male.   HPI  Robert Hines is a 65 y.o. male presenting to UC with c/o nasal congestion, mild nonproductive cough, mild sore throat and headache for 2 days.  He reports being exposed to a friend last weekend who tested positive for COVID.  Pt went to a walk-in clinic yesterday, had a rapid COVID test, which was negative. Pfizer COVID vaccine in February/March 2021. No booster. Denies fever, chills, n/v/d. He has been taking OTC sudafed, tylenol and motrin.     Past Medical History:  Diagnosis Date  . Arthritis    neck  . COVID-19   . Depression   . Diabetes mellitus without complication (HCC)    type 2  . Hyperlipidemia   . Hypertension   . Pneumonia    covid pneumonia and covid july 2020, d/ced july 16th from [facility] stayed 9 days  . Renal disorder    calculi, multiple lithotripsies, stents  . Right cataract   . Rotator cuff disorder   . Spinal pain     Patient Active Problem List   Diagnosis Date Noted  . Transaminitis 08/16/2019  . Thrombocytopenia (HCC) 02/03/2019  . Pneumonia due to COVID-19 virus 12/20/2018  . Mass of left thigh 05/24/2018  . History of DVT (deep vein thrombosis) 05/09/2018  . Primary insomnia 10/30/2017  . S/P cervical spinal fusion 09/28/2017  . Left shoulder pain 01/05/2017  . Depression 05/02/2016  . Obesity 01/23/2015  . Right shoulder pain 01/23/2015  . Former smoker 08/28/2014  . Nephrolithiasis 07/25/2014  . Diabetes mellitus, type 2 (HCC) 07/25/2014  . Essential hypertension, benign 07/25/2014  . Annual physical exam 07/25/2014  . Hyperlipidemia 07/25/2014    Past Surgical History:  Procedure Laterality Date  . ANTERIOR FUSION CERVICAL SPINE  2007  . ARTHOSCOPIC ROTAOR CUFF REPAIR Left 03/21/2019    Procedure: ARTHROSCOPIC ROTATOR CUFF REPAIR;  Surgeon: Jones Broom, MD;  Location: [facility];  Service: Orthopedics;  Laterality: Left;  . EYE SURGERY Left    cataract  . FOOT SURGERY Right    pin placed and removed  . HERNIA REPAIR     inguinal  . LITHOTRIPSY     several  . SHOULDER ARTHROSCOPY WITH ROTATOR CUFF REPAIR AND SUBACROMIAL DECOMPRESSION Right 12/28/2015   Procedure: SHOULDER ARTHROSCOPY TAKE DOWN ROTATOR CUFF REPAIR AND SUBACROMIAL DECOMPRESSION, DEBRIDEMENT OF SLAP TEAR;  Surgeon: Jones Broom, MD;  Location: [facility];  Service: Orthopedics;  Laterality: Right;  SHOULDER ARTHROSCOPY TAKE DOWN ROTATOR CUFF REPAIR AND SUBACROMIAL DECOMPRESSION  . SHOULDER ARTHROSCOPY WITH SUBACROMIAL DECOMPRESSION Left 03/21/2019   Procedure: SHOULDER ARTHROSCOPY WITH SUBACROMIAL DECOMPRESSION;  Surgeon: Jones Broom, MD;  Location: [facility];  Service: Orthopedics;  Laterality: Left;  [name] VASECTOMY Bilateral        Home Medications    Prior to Admission medications   Medication Sig Start Date End Date Taking? Authorizing Provider  aspirin 81 MG chewable tablet Chew by mouth daily.   Yes [provider]  buPROPion (WELLBUTRIN XL) 300 MG 24 hr tablet TAKE 1 TABLET BY MOUTH DAILY 08/01/19  Yes Monica Becton, MD  cyclobenzaprine (FLEXERIL) 10 MG tablet Take 10 mg by mouth 3 (three) times daily as needed for  muscle spasms.   Yes [provider]  Dulaglutide (TRULICITY) 1.5 MG/0.5ML SOPN Wednesday in pm 11/11/19  Yes Monica Becton, MD  gabapentin (NEURONTIN) 300 MG capsule TAKE 3 CAPSULES IN THE MORNING AND 4 CAPSULES IN THE EVENING 01/29/20  Yes Monica Becton, MD  Insulin Glargine (BASAGLAR KWIKPEN) 100 UNIT/ML Inject 94 Units into the skin at bedtime. appt for refills 06/26/20  Yes Monica Becton, MD  Insulin Pen Needle 32G X 4 MM MISC Use as needed with toujeo pen 08/04/15  Yes  Monica Becton, MD  lisinopril (ZESTRIL) 20 MG tablet TAKE 1 TABLET DAILY (NEED LABS) 04/29/20  Yes Monica Becton, MD  simvastatin (ZOCOR) 40 MG tablet TAKE 1 TABLET DAILY 01/29/20  Yes Monica Becton, MD  XIGDUO XR 03-999 MG TB24 TAKE 1 TABLET BY MOUTH DAILY 11/08/19  Yes Monica Becton, MD  zolpidem (AMBIEN) 10 MG tablet TAKE 1 TABLET BY MOUTH AT BEDTIME AS NEEDED SLEEP 05/05/20  Yes Monica Becton, MD  cefdinir (OMNICEF) 300 MG capsule Take 1 capsule (300 mg total) by mouth 2 (two) times daily. 05/12/20   Breeback, Jade L, PA-C  methylPREDNISolone (MEDROL DOSEPAK) 4 MG TBPK tablet Take as directed by package insert. 05/12/20   Breeback, Jade L, PA-C  ofloxacin (FLOXIN) 0.3 % OTIC solution Place 10 drops into the left ear daily. For 7 days. 04/27/20   Jomarie Longs, PA-C    Family History Family History  Problem Relation Age of Onset  . Hypertension Mother   . Heart failure Father   . Diabetes Father   . Hypertension Father   . Heart failure Sister   . Hypertension Sister     Social History Social History   Tobacco Use  . Smoking status: Former Smoker    Types: Cigarettes    Quit date: 10/01/2018    Years since quitting: 1.7  . Smokeless tobacco: Never Used  Vaping Use  . Vaping Use: Never used  Substance Use Topics  . Alcohol use: Yes    Alcohol/week: 0.0 standard drinks    Comment: social  . Drug use: No     Allergies   Augmentin [amoxicillin-pot clavulanate], Codeine, and Duloxetine   Review of Systems Review of Systems  Constitutional: Negative for chills and fever.  HENT: Positive for congestion. Negative for ear pain, sore throat, trouble swallowing and voice change.   Respiratory: Positive for cough. Negative for shortness of breath.   Cardiovascular: Negative for chest pain and palpitations.  Gastrointestinal: Negative for abdominal pain, diarrhea, nausea and vomiting.  Musculoskeletal: Negative for arthralgias,  back pain and myalgias.  Skin: Negative for rash.  Neurological: Positive for headaches. Negative for dizziness and light-headedness.  All other systems reviewed and are negative.    Physical Exam Triage Vital Signs ED Triage Vitals  Enc Vitals Group     BP 07/11/20 1042 136/75     Pulse Rate 07/11/20 1042 90     Resp --      Temp 07/11/20 1042 98.3 F (36.8 C)     Temp Source 07/11/20 1042 Oral     SpO2 07/11/20 1042 97 %     Weight --      Height --      Head Circumference --      Peak Flow --      Pain Score 07/11/20 1043 0     Pain Loc --      Pain Edu? --  Excl. in GC? --    No data found.  Updated Vital Signs BP 136/75 (BP Location: Left Arm)   Pulse 90   Temp 98.3 F (36.8 C) (Oral)   SpO2 97%   Visual Acuity Right Eye Distance:   Left Eye Distance:   Bilateral Distance:    Right Eye Near:   Left Eye Near:    Bilateral Near:     Physical Exam Vitals and nursing note reviewed.  Constitutional:      General: He is not in acute distress.    Appearance: Normal appearance. He is well-developed and well-nourished. He is not ill-appearing, toxic-appearing or diaphoretic.  HENT:     Head: Normocephalic and atraumatic.     Right Ear: Tympanic membrane and ear canal normal.     Left Ear: Tympanic membrane and ear canal normal.     Nose: Nose normal.     Right Sinus: No maxillary sinus tenderness or frontal sinus tenderness.     Left Sinus: No maxillary sinus tenderness or frontal sinus tenderness.     Mouth/Throat:     Lips: Pink.     Mouth: Mucous membranes are moist.     Pharynx: Oropharynx is clear. Uvula midline. No pharyngeal swelling, oropharyngeal exudate, posterior oropharyngeal erythema or uvula swelling.     Tonsils: No tonsillar exudate or tonsillar abscesses.  Eyes:     Extraocular Movements: EOM normal.  Cardiovascular:     Rate and Rhythm: Normal rate and regular rhythm.  Pulmonary:     Effort: Pulmonary effort is normal. No  respiratory distress.     Breath sounds: Normal breath sounds. No stridor. No wheezing, rhonchi or rales.  Musculoskeletal:        General: Normal range of motion.     Cervical back: Normal range of motion.  Skin:    General: Skin is warm and dry.  Neurological:     Mental Status: He is alert and oriented to person, place, and time.  Psychiatric:        Mood and Affect: Mood and affect normal.        Behavior: Behavior normal.      UC Treatments / Results  Labs (all labs ordered are listed, but only abnormal results are displayed) Labs Reviewed  SARS-COV-2 RNA,(COVID-19) QUALITATIVE NAAT    EKG   Radiology No results found.  Procedures Procedures (including critical care time)  Medications Ordered in UC Medications - No data to display  Initial Impression / Assessment and Plan / UC Course  I have reviewed the triage vital signs and the nursing notes.  Pertinent labs & imaging results that were available during my care of the patient were reviewed by me and considered in my medical decision making (see chart for details).     No evidence of bacterial infection at this time. COVID pending Encouraged symptomatic tx F/u with PCP as needed AVS given  Final Clinical Impressions(s) / UC Diagnoses   Final diagnoses:  Close exposure to COVID-19 virus  Viral upper respiratory tract infection with cough     Discharge Instructions     CDC recommends isolation for 5 days from symptom onset and fever free for 24 hours without use of fever reducing medication and symptoms improving. Then wear a well fitting mask for 5 days when around others. If not improving, isolate for 10 days and consider reevaluation by a medical provider to rule out a secondary bacterial infection.   Call 911 or have someone drive you  to the hospital if symptoms significantly worsening.     ED Prescriptions    None     PDMP not reviewed this encounter.   Lurene Shadowhelps, Annessa Satre O, [place] JerseyPA-C 07/11/20  1119

## 2020-07-14 LAB — SARS-COV-2 RNA,(COVID-19) QUALITATIVE NAAT: SARS CoV2 RNA: DETECTED — CR

## 2020-08-07 ENCOUNTER — Ambulatory Visit (INDEPENDENT_AMBULATORY_CARE_PROVIDER_SITE_OTHER): Payer: Medicare HMO | Admitting: Sports Medicine

## 2020-08-07 ENCOUNTER — Other Ambulatory Visit: Payer: Self-pay

## 2020-08-07 ENCOUNTER — Encounter: Payer: Self-pay | Admitting: Sports Medicine

## 2020-08-07 VITALS — BP 124/70 | HR 80 | Ht 72.0 in | Wt 242.0 lb

## 2020-08-07 DIAGNOSIS — R69 Illness, unspecified: Secondary | ICD-10-CM | POA: Diagnosis not present

## 2020-08-07 DIAGNOSIS — Z794 Long term (current) use of insulin: Secondary | ICD-10-CM | POA: Diagnosis not present

## 2020-08-07 DIAGNOSIS — R7989 Other specified abnormal findings of blood chemistry: Secondary | ICD-10-CM | POA: Diagnosis not present

## 2020-08-07 DIAGNOSIS — N200 Calculus of kidney: Secondary | ICD-10-CM | POA: Diagnosis not present

## 2020-08-07 DIAGNOSIS — E119 Type 2 diabetes mellitus without complications: Secondary | ICD-10-CM | POA: Diagnosis not present

## 2020-08-07 DIAGNOSIS — F32A Depression, unspecified: Secondary | ICD-10-CM

## 2020-08-07 DIAGNOSIS — Z0000 Encounter for general adult medical examination without abnormal findings: Secondary | ICD-10-CM | POA: Diagnosis not present

## 2020-08-07 DIAGNOSIS — I10 Essential (primary) hypertension: Secondary | ICD-10-CM

## 2020-08-07 DIAGNOSIS — Z23 Encounter for immunization: Secondary | ICD-10-CM | POA: Diagnosis not present

## 2020-08-07 MED ORDER — KETOROLAC TROMETHAMINE 10 MG PO TABS: 10.0000 mg | ORAL_TABLET | Freq: Three times a day (TID) | ORAL | 11 refills | Status: DC | PRN

## 2020-08-07 MED ORDER — TRULICITY 1.5 MG/0.5ML ~~LOC~~ SOAJ: SUBCUTANEOUS | 11 refills | Status: DC

## 2020-08-07 MED ORDER — BASAGLAR KWIKPEN 100 UNIT/ML ~~LOC~~ SOPN: 94.0000 [IU] | PEN_INJECTOR | Freq: Every day | SUBCUTANEOUS | 11 refills | Status: DC

## 2020-08-07 MED ORDER — LISINOPRIL 20 MG PO TABS: 20.0000 mg | ORAL_TABLET | Freq: Every day | ORAL | 11 refills | Status: DC

## 2020-08-07 MED ORDER — ZOLPIDEM TARTRATE 10 MG PO TABS: ORAL_TABLET | ORAL | 3 refills | Status: DC

## 2020-08-07 MED ORDER — SIMVASTATIN 40 MG PO TABS: 40.0000 mg | ORAL_TABLET | Freq: Every day | ORAL | 11 refills | Status: DC

## 2020-08-07 MED ORDER — XIGDUO XR 10-1000 MG PO TB24: 1.0000 | ORAL_TABLET | Freq: Every day | ORAL | 11 refills | Status: DC

## 2020-08-07 MED ORDER — BUPROPION HCL ER (XL) 300 MG PO TB24: 300.0000 mg | ORAL_TABLET | Freq: Every day | ORAL | 11 refills | Status: DC

## 2020-08-07 NOTE — Assessment & Plan Note (Signed)
Robert Hines returns, we will get him up-to-date on some of his screening measures, he does need his welcome to Medicare visit, we will set him up with our nurse, he needs a foot exam, pneumococcal 13. He is up-to-date on other measures. His mood is good, and he has not had any falls.

## 2020-08-07 NOTE — Assessment & Plan Note (Signed)
Due for labs and a diabetic foot exam.

## 2020-08-07 NOTE — Assessment & Plan Note (Signed)
Toradol oral seems to work well, we will refill this for him. He could certainly have some Dilaudid should he get another kidney stone.

## 2020-08-07 NOTE — Addendum Note (Signed)
Addended by: Carolin Coy on: 08/07/2020 02:48 PM   Modules accepted: Orders

## 2020-08-07 NOTE — Assessment & Plan Note (Signed)
Mood stable on Wellbutrin, no change in plan or dose.

## 2020-08-07 NOTE — Progress Notes (Addendum)
    Procedures performed today:    None.  Independent interpretation of notes and tests performed by another provider:   None.  Brief History, Exam, Impression, and Recommendations:    Annual physical exam Robert Hines returns, we will get him up-to-date on some of his screening measures, he does need his welcome to Medicare visit, we will set him up with our nurse, he needs a foot exam, pneumococcal 13. He is up-to-date on other measures. His mood is good, and he has not had any falls.  Depression Mood stable on Wellbutrin, no change in plan or dose.  Diabetes mellitus, type 2 Due for labs and a diabetic foot exam.  Nephrolithiasis Toradol oral seems to work well, we will refill this for him. He could certainly have some Dilaudid should he get another kidney stone.  Elevated TSH Adding t3 and T4 levels.    ___________________________________________ Ihor Austin. Benjamin Stain, M.D., ABFM., CAQSM. Primary Care and Sports Medicine [facility]  Adjunct Instructor of Family Medicine  [employer]

## 2020-08-12 DIAGNOSIS — E119 Type 2 diabetes mellitus without complications: Secondary | ICD-10-CM | POA: Diagnosis not present

## 2020-08-12 DIAGNOSIS — Z794 Long term (current) use of insulin: Secondary | ICD-10-CM | POA: Diagnosis not present

## 2020-08-12 DIAGNOSIS — R7989 Other specified abnormal findings of blood chemistry: Secondary | ICD-10-CM | POA: Diagnosis not present

## 2020-08-13 DIAGNOSIS — R7989 Other specified abnormal findings of blood chemistry: Secondary | ICD-10-CM | POA: Insufficient documentation

## 2020-08-13 NOTE — Addendum Note (Signed)
Addended by: Monica Becton on: 08/13/2020 01:37 PM   Modules accepted: Orders

## 2020-08-13 NOTE — Assessment & Plan Note (Addendum)
Adding t3 and T4 levels.

## 2020-08-14 ENCOUNTER — Other Ambulatory Visit: Payer: Self-pay

## 2020-08-14 ENCOUNTER — Ambulatory Visit (INDEPENDENT_AMBULATORY_CARE_PROVIDER_SITE_OTHER): Payer: Medicare HMO | Admitting: Sports Medicine

## 2020-08-14 VITALS — BP 151/71 | HR 75 | Temp 98.4°F | Resp 16 | Ht 73.0 in | Wt 242.1 lb

## 2020-08-14 DIAGNOSIS — Z0000 Encounter for general adult medical examination without abnormal findings: Secondary | ICD-10-CM | POA: Diagnosis not present

## 2020-08-14 DIAGNOSIS — Z136 Encounter for screening for cardiovascular disorders: Secondary | ICD-10-CM | POA: Diagnosis not present

## 2020-08-14 LAB — CBC
HCT: 44.6 % (ref 38.5–50.0)
Hemoglobin: 15.3 g/dL (ref 13.2–17.1)
MCH: 33.5 pg — ABNORMAL HIGH (ref 27.0–33.0)
MCHC: 34.3 g/dL (ref 32.0–36.0)
MCV: 97.6 fL (ref 80.0–100.0)
MPV: 10.9 fL (ref 7.5–12.5)
Platelets: 97 10*3/uL — ABNORMAL LOW (ref 140–400)
RBC: 4.57 10*6/uL (ref 4.20–5.80)
RDW: 13.1 % (ref 11.0–15.0)
WBC: 6.2 10*3/uL (ref 3.8–10.8)

## 2020-08-14 LAB — HEMOGLOBIN A1C
Hgb A1c MFr Bld: 7.7 % of total Hgb — ABNORMAL HIGH (ref <5.7)
Mean Plasma Glucose: 174 mg/dL
eAG (mmol/L): 9.7 mmol/L

## 2020-08-14 LAB — COMPREHENSIVE METABOLIC PANEL
AG Ratio: 1.8 (calc) (ref 1.0–2.5)
ALT: 53 U/L — ABNORMAL HIGH (ref 9–46)
AST: 33 U/L (ref 10–35)
Albumin: 4.2 g/dL (ref 3.6–5.1)
Alkaline phosphatase (APISO): 68 U/L (ref 35–144)
BUN: 15 mg/dL (ref 7–25)
CO2: 27 mmol/L (ref 20–32)
Calcium: 9.3 mg/dL (ref 8.6–10.3)
Chloride: 103 mmol/L (ref 98–110)
Creat: 0.96 mg/dL (ref 0.70–1.25)
Globulin: 2.3 g/dL (calc) (ref 1.9–3.7)
Glucose, Bld: 116 mg/dL — ABNORMAL HIGH (ref 65–99)
Potassium: 4.1 mmol/L (ref 3.5–5.3)
Sodium: 139 mmol/L (ref 135–146)
Total Bilirubin: 0.4 mg/dL (ref 0.2–1.2)
Total Protein: 6.5 g/dL (ref 6.1–8.1)

## 2020-08-14 LAB — T4, FREE: Free T4: 1 ng/dL (ref 0.8–1.8)

## 2020-08-14 LAB — TEST AUTHORIZATION

## 2020-08-14 LAB — LIPID PANEL
Cholesterol: 171 mg/dL (ref <200)
HDL: 45 mg/dL (ref >=40)
LDL Cholesterol (Calc): 95 mg/dL (calc)
Non-HDL Cholesterol (Calc): 126 mg/dL (calc) (ref <130)
Total CHOL/HDL Ratio: 3.8 (calc) (ref <5.0)
Triglycerides: 217 mg/dL — ABNORMAL HIGH (ref <150)

## 2020-08-14 LAB — T3, FREE: T3, Free: 3.3 pg/mL (ref 2.3–4.2)

## 2020-08-14 LAB — TSH: TSH: 4.82 mIU/L — ABNORMAL HIGH (ref 0.40–4.50)

## 2020-08-14 NOTE — Progress Notes (Signed)
MEDICARE ANNUAL WELLNESS VISIT  08/14/2020  Subjective:  Robert Hines is a 65 y.o. male patient of Thekkekandam, Ihor Austin, MD who had a Medicare Annual Wellness Visit today. Robert Hines is Retired and lives with their spouse and his younger daughter and her husband. he has 2 children. he reports that he is socially active and does interact with friends/family regularly. he is minimally physically active and enjoys the outdoors and doing yard work.  Patient Care Team: Monica Becton, MD as PCP - General (Family Medicine)  Advanced Directives 08/14/2020 04/04/2019 03/21/2019 08/25/2017 01/12/2016 12/31/2015 12/28/2015  Does Patient Have a Medical Advance Directive? No No No No No No No  Would patient like information on creating a medical advance directive? No - Patient declined No - Patient declined No - Patient declined No - Patient declined No - patient declined information - No - patient declined information    Hospital Utilization Over the Past 12 Months: # of hospitalizations or ER visits: 5 # of surgeries: 1  Review of Systems    Patient reports that his overall health is unchanged when compared to last year.  Review of Systems: History obtained from chart review and the patient  All other systems negative.  Pain Assessment Pain : 0-10 Pain Score: 2  Pain Type: Chronic pain Pain Location: Back Pain Orientation: Lower Pain Descriptors / Indicators: Aching Pain Onset: More than a month ago Pain Frequency: Intermittent Pain Relieving Factors: Advil Effect of Pain on Daily Activities: Little bit  Pain Relieving Factors: Advil  Current Medications & Allergies (verified) Allergies as of 08/14/2020      Reactions   Augmentin [amoxicillin-pot Clavulanate] Diarrhea   Codeine Diarrhea, Nausea Only   Duloxetine Diarrhea, Nausea Only   cymbalta is rash      Medication List       Accurate as of August 14, 2020 12:03 PM. If you have any questions, ask your nurse or doctor.         aspirin 81 MG chewable tablet Chew by mouth daily.   Basaglar KwikPen 100 UNIT/ML Inject 94 Units into the skin at bedtime. appt for refills What changed: additional instructions   buPROPion 300 MG 24 hr tablet Commonly known as: WELLBUTRIN XL Take 1 tablet (300 mg total) by mouth daily.   cyclobenzaprine 10 MG tablet Commonly known as: FLEXERIL Take 10 mg by mouth 3 (three) times daily as needed for muscle spasms.   gabapentin 300 MG capsule Commonly known as: NEURONTIN TAKE 3 CAPSULES IN THE MORNING AND 4 CAPSULES IN THE EVENING   Insulin Pen Needle 32G X 4 MM Misc Use as needed with toujeo pen   ketorolac 10 MG tablet Commonly known as: TORADOL Take 1 tablet (10 mg total) by mouth every 8 (eight) hours as needed.   lisinopril 20 MG tablet Commonly known as: ZESTRIL Take 1 tablet (20 mg total) by mouth daily.   simvastatin 40 MG tablet Commonly known as: ZOCOR Take 1 tablet (40 mg total) by mouth daily.   Trulicity 1.5 MG/0.5ML Sopn Generic drug: Dulaglutide Wednesday in pm   Xigduo XR 03-999 MG Tb24 Generic drug: Dapagliflozin-metFORMIN HCl ER Take 1 tablet by mouth daily.   zolpidem 10 MG tablet Commonly known as: AMBIEN TAKE 1 TABLET BY MOUTH AT BEDTIME AS NEEDED SLEEP       History (reviewed): Past Medical History:  Diagnosis Date  . Arthritis    neck  . COVID-19   . Depression   . Diabetes mellitus  without complication (HCC)    type 2  . Hyperlipidemia   . Hypertension   . Pneumonia    covid pneumonia and covid july 2020, d/ced july 16th from [facility] stayed 9 days  . Renal disorder    calculi, multiple lithotripsies, stents  . Right cataract   . Rotator cuff disorder   . Spinal pain    Past Surgical History:  Procedure Laterality Date  . ANTERIOR FUSION CERVICAL SPINE  2007  . ARTHOSCOPIC ROTAOR CUFF REPAIR Left 03/21/2019   Procedure: ARTHROSCOPIC ROTATOR CUFF REPAIR;  Surgeon: Jones Broom, MD;  Location: [facility];  Service: Orthopedics;  Laterality: Left;  . EYE SURGERY Left    cataract  . FOOT SURGERY Right    pin placed and removed  . HERNIA REPAIR     inguinal  . LITHOTRIPSY     several  . SHOULDER ARTHROSCOPY WITH ROTATOR CUFF REPAIR AND SUBACROMIAL DECOMPRESSION Right 12/28/2015   Procedure: SHOULDER ARTHROSCOPY TAKE DOWN ROTATOR CUFF REPAIR AND SUBACROMIAL DECOMPRESSION, DEBRIDEMENT OF SLAP TEAR;  Surgeon: Jones Broom, MD;  Location: [facility];  Service: Orthopedics;  Laterality: Right;  SHOULDER ARTHROSCOPY TAKE DOWN ROTATOR CUFF REPAIR AND SUBACROMIAL DECOMPRESSION  . SHOULDER ARTHROSCOPY WITH SUBACROMIAL DECOMPRESSION Left 03/21/2019   Procedure: SHOULDER ARTHROSCOPY WITH SUBACROMIAL DECOMPRESSION;  Surgeon: Jones Broom, MD;  Location: [facility];  Service: Orthopedics;  Laterality: Left;  [name] VASECTOMY Bilateral    Family History  Problem Relation Age of Onset  . Hypertension Mother   . Heart failure Father   . Diabetes Father   . Hypertension Father   . Heart failure Sister   . Hypertension Sister    Social History   Socioeconomic History  . Marital status: Married    Spouse name: Robert Hines)  . Number of children: 2  . Years of education: 12th grade  . Highest education level: High school graduate  Occupational History  . Occupation: Retired  Tobacco Use  . Smoking status: Former Smoker    Types: Cigarettes    Quit date: 10/01/2018    Years since quitting: 1.8  . Smokeless tobacco: Never Used  Vaping Use  . Vaping Use: Never used  Substance and Sexual Activity  . Alcohol use: Yes    Alcohol/week: 0.0 standard drinks    Comment: social  . Drug use: No  . Sexual activity: Yes    Birth control/protection: None  Other Topics Concern  . Not on file  Social History Narrative   Lives with his wife. He has recently retired and is working on finding new hobbies. He loves to do things outdoors such as gardening or  fishing.    Social Determinants of Health   Financial Resource Strain: Low Risk   . Difficulty of Paying Living Expenses: Not hard at all  Food Insecurity: No Food Insecurity  . Worried About Programme researcher, broadcasting/film/video in the Last Year: Never true  . Ran Out of Food in the Last Year: Never true  Transportation Needs: No Transportation Needs  . Lack of Transportation (Medical): No  . Lack of Transportation (Non-Medical): No  Physical Activity: Inactive  . Days of Exercise per Week: 0 days  . Minutes of Exercise per Session: 0 min  Stress: No Stress Concern Present  . Feeling of Stress : Not at all  Social Connections: Moderately Isolated  . Frequency of Communication with Friends and Family: More than three times a week  . Frequency of  Social Gatherings with Friends and Family: Once a week  . Attends Religious Services: Never  . Active Member of Clubs or Organizations: No  . Attends Banker Meetings: Never  . Marital Status: Married    Activities of Daily Living In your present state of health, do you have any difficulty performing the following activities: 08/14/2020  Hearing? N  Comment Has new hearing aids  Vision? N  Difficulty concentrating or making decisions? Y  Comment little bit  Walking or climbing stairs? N  Dressing or bathing? Y  Comment help with getting socks on (sometimes)  Doing errands, shopping? N  Preparing Food and eating ? N  Using the Toilet? N  In the past six months, have you accidently leaked urine? N  Do you have problems with loss of bowel control? N  Managing your Medications? Y  Comment his wife helps with his pill box  Managing your Finances? Y  Comment his wife helps with that  Housekeeping or managing your Housekeeping? Y  Comment wife helps with that  Some recent data might be hidden    Patient Education/Literacy How often do you need to have someone help you when you read instructions, pamphlets, or other written materials from  your doctor or pharmacy?: 1 - Never What is the last grade level you completed in school?: 12th grade  Exercise Current Exercise Habits: The patient does not participate in regular exercise at present, Exercise limited by: None identified  Diet Patient reports consuming 3 meals a day and 1 snack(s) a day Patient reports that his primary diet is: Regular Patient reports that she does have regular access to food.   Depression Screen PHQ 2/9 Scores 08/14/2020 08/07/2020 02/12/2020 08/19/2016  PHQ - 2 Score 0 0 0 4  PHQ- 9 Score 0 0 0 20     Fall Risk Fall Risk  08/14/2020 08/07/2020 02/12/2020  Falls in the past year? 0 0 0  Number falls in past yr: 0 0 0  Injury with Fall? 0 0 0  Risk for fall due to : No Fall Risks - -  Follow up Falls evaluation completed - -     Objective:   BP (!) 151/71 (BP Location: Left Arm, Patient Position: Sitting, Cuff Size: Normal)   Pulse 75   Temp 98.4 F (36.9 C) (Oral)   Resp 16   Ht 6\' 1"  (1.854 m)   Wt 242 lb 1.9 oz (109.8 kg)   SpO2 98%   BMI 31.94 kg/m   Last Weight  Most recent update: 08/14/2020 11:03 AM   Weight  109.8 kg (242 lb 1.9 oz)            Body mass index is 31.94 kg/m.  Hearing/Vision  . Robert Hines did not have difficulty with hearing/understanding during the face-to-face interview . Robert Hines did not have difficulty with his vision during the face-to-face interview . Reports that he has not had a formal eye exam by an eye care professional within the past year but is scheduled for May, 2022. 07-23-1995 Reports that he has had a formal hearing evaluation within the past year  Cognitive Function: 6CIT Screen 08/14/2020  What Year? 0 points  What month? 0 points  What time? 0 points  Count back from 20 0 points  Months in reverse 0 points  Repeat phrase 2 points  Total Score 2    Normal Cognitive Function Screening: Yes (Normal:0-7, Significant for Dysfunction: >8)  Immunization & Health Maintenance Record Immunization  History   Administered Date(s) Administered  . Fluad Quad(high Dose 65+) 04/02/2019  . Influenza-Unspecified 04/03/2014, 03/31/2015, 03/28/2016, 03/12/2020  . PFIZER(Purple Top)SARS-COV-2 Vaccination 08/02/2019, 08/28/2019  . Pneumococcal Conjugate-13 08/07/2020  . Pneumococcal Polysaccharide-23 01/23/2015, 04/02/2019  . Td 05/21/2008  . Tdap 05/24/2018  . Zoster 01/23/2015    Health Maintenance  Topic Date Due  . OPHTHALMOLOGY EXAM  05/07/2020  . COVID-19 Vaccine (3 - Booster for Pfizer series) 09/17/2020 (Originally 02/28/2020)  . HEMOGLOBIN A1C  02/09/2021  . FOOT EXAM  08/07/2021  . COLONOSCOPY (Pts 45-2982yrs Insurance coverage will need to be confirmed)  03/08/2025  . TETANUS/TDAP  05/24/2028  . INFLUENZA VACCINE  Completed  . Hepatitis C Screening  Completed  . PNA vac Low Risk Adult  Completed       Assessment  This is a routine wellness examination for Robert Hines.  Health Maintenance: Due or Overdue Health Maintenance Due  Topic Date Due  . OPHTHALMOLOGY EXAM  05/07/2020    Robert Hines does not need a referral for Community Assistance: Care Management:   no Social Work:    no Prescription Assistance:  no Nutrition/Diabetes Education:  no   Plan:  Personalized Goals Goals Addressed              This Visit's Progress   .  Patient Stated (pt-stated)        08/14/2020 AWV Goal: Diabetes Management  . Patient will maintain an A1C level below 7.5 . Patient will not develop any diabetic foot complications . Patient will not experience any hypoglycemic episodes over the next 3 months . Patient will notify our office of any CBG readings outside of the provider recommended range by calling [phone] . Patient will adhere to provider recommendations for diabetes management  Patient Self Management Activities . take all medications as prescribed and report any negative side effects . monitor and record blood sugar readings as directed . adhere to a low  carbohydrate diet that incorporates lean proteins, vegetables, whole grains, low glycemic fruits . check feet daily noting any sores, cracks, injuries, or callous formations . see PCP or podiatrist if he notices any changes in his legs, feet, or toenails . Patient will visit PCP and have an A1C level checked every 3 to 6 months as directed  . have a yearly eye exam to monitor for vascular changes associated with diabetes and will request that the report be sent to his pcp.  . consult with his PCP regarding any changes in his health or new or worsening symptoms       Personalized Health Maintenance & Screening Recommendations  Shingrix  Lung Cancer Screening Recommended: no (Low Dose CT Chest recommended if Age 42-80 years, 30 pack-year currently smoking OR have quit w/in past 15 years) Hepatitis C Screening recommended: no HIV Screening recommended: no  Advanced Directives: Written information was not given per the patient's request.  Referrals & Orders Orders Placed This Encounter  Procedures  . US AORTA    Follow-up Plan . Follow-up with Monica Bectonhekkekandam, Thomas J, MD as planned . Schedule your appointment with the pharmacy for your shingrix appointment.    I have personally reviewed and noted the following in the patient's chart:   . Medical and social history . Use of alcohol, tobacco or illicit drugs  . Current medications and supplements . Functional ability and status . Nutritional status . Physical activity . Advanced directives . List of other physicians . Hospitalizations, surgeries, and ER visits in  previous 12 months . Vitals . Screenings to include cognitive, depression, and falls . Referrals and appointments  In addition, I have reviewed and discussed with patient certain preventive protocols, quality metrics, and best practice recommendations. A written personalized care plan for preventive services as well as general preventive health recommendations were  provided to patient.     Modesto Charon, RN  08/14/2020

## 2020-08-14 NOTE — Patient Instructions (Addendum)
Health Maintenance, Male Adopting a healthy lifestyle and getting preventive care are important in promoting health and wellness. Ask your health care provider about:  The right schedule for you to have regular tests and exams.  Things you can do on your own to prevent diseases and keep yourself healthy. What should I know about diet, weight, and exercise? Eat a healthy diet  Eat a diet that includes plenty of vegetables, fruits, low-fat dairy products, and lean protein.  Do not eat a lot of foods that are high in solid fats, added sugars, or sodium.   Maintain a healthy weight Body mass index (BMI) is a measurement that can be used to identify possible weight problems. It estimates body fat based on height and weight. Your health care provider can help determine your BMI and help you achieve or maintain a healthy weight. Get regular exercise Get regular exercise. This is one of the most important things you can do for your health. Most adults should:  Exercise for at least 150 minutes each week. The exercise should increase your heart rate and make you sweat (moderate-intensity exercise).  Do strengthening exercises at least twice a week. This is in addition to the moderate-intensity exercise.  Spend less time sitting. Even light physical activity can be beneficial. Watch cholesterol and blood lipids Have your blood tested for lipids and cholesterol at 67 years of age, then have this test every 5 years. You may need to have your cholesterol levels checked more often if:  Your lipid or cholesterol levels are high.  You are older than 67 years of age.  You are at high risk for heart disease. What should I know about cancer screening? Many types of cancers can be detected early and may often be prevented. Depending on your health history and family history, you may need to have cancer screening at various ages. This may include screening for:  Colorectal cancer.  Prostate  cancer.  Skin cancer.  Lung cancer. What should I know about heart disease, diabetes, and high blood pressure? Blood pressure and heart disease  High blood pressure causes heart disease and increases the risk of stroke. This is more likely to develop in people who have high blood pressure readings, are of African descent, or are overweight.  Talk with your health care provider about your target blood pressure readings.  Have your blood pressure checked: ? Every 3-5 years if you are 55-22 years of age. ? Every year if you are 41 years old or older.  If you are between the ages of 16 and 56 and are a current or former smoker, ask your health care provider if you should have a one-time screening for abdominal aortic aneurysm (AAA). Diabetes Have regular diabetes screenings. This checks your fasting blood sugar level. Have the screening done:  Once every three years after age 70 if you are at a normal weight and have a low risk for diabetes.  More often and at a younger age if you are overweight or have a high risk for diabetes. What should I know about preventing infection? Hepatitis B If you have a higher risk for hepatitis B, you should be screened for this virus. Talk with your health care provider to find out if you are at risk for hepatitis B infection. Hepatitis C Blood testing is recommended for:  Everyone born from [birth year] through 1967.  Anyone with known risk factors for hepatitis C. Sexually transmitted infections (STIs)  You should be screened  each year for STIs, including gonorrhea and chlamydia, if: ? You are sexually active and are younger than 67 years of age. ? You are older than 67 years of age and your health care provider tells you that you are at risk for this type of infection. ? Your sexual activity has changed since you were last screened, and you are at increased risk for chlamydia or gonorrhea. Ask your health care provider if you are at risk.  Ask your  health care provider about whether you are at high risk for HIV. Your health care provider may recommend a prescription medicine to help prevent HIV infection. If you choose to take medicine to prevent HIV, you should first get tested for HIV. You should then be tested every 3 months for as long as you are taking the medicine. Follow these instructions at home: Lifestyle  Do not use any products that contain nicotine or tobacco, such as cigarettes, e-cigarettes, and chewing tobacco. If you need help quitting, ask your health care provider.  Do not use street drugs.  Do not share needles.  Ask your health care provider for help if you need support or information about quitting drugs. Alcohol use  Do not drink alcohol if your health care provider tells you not to drink.  If you drink alcohol: ? Limit how much you have to 0-2 drinks a day. ? Be aware of how much alcohol is in your drink. In the [place] one drink equals one 12 oz bottle of beer (355 mL), one 5 oz glass of wine (148 mL), or one 1 oz glass of hard liquor (44 mL). General instructions  Schedule regular health, dental, and eye exams.  Stay current with your vaccines.  Tell your health care provider if: ? You often feel depressed. ? You have ever been abused or do not feel safe at home. Summary  Adopting a healthy lifestyle and getting preventive care are important in promoting health and wellness.  Follow your health care provider's instructions about healthy diet, exercising, and getting tested or screened for diseases.  Follow your health care provider's instructions on monitoring your cholesterol and blood pressure. This information is not intended to replace advice given to you by your health care provider. Make sure you discuss any questions you have with your health care provider. Document Revised: 05/30/2018 Document Reviewed: 05/30/2018 Elsevier Patient Education  2021 Elsevier Inc.    MEDICARE [place]  VISIT Health Maintenance Summary and Written Plan of Care  Mr. Hern ,  Thank you for allowing me to perform your Medicare Annual Wellness Visit and for your ongoing commitment to your health.   Health Maintenance & Immunization History Health Maintenance  Topic Date Due  . OPHTHALMOLOGY EXAM  05/07/2020  . COVID-19 Vaccine (3 - Booster for Pfizer series) 09/17/2020 (Originally 02/28/2020)  . HEMOGLOBIN A1C  02/09/2021  . FOOT EXAM  08/07/2021  . COLONOSCOPY (Pts 45-66yrs Insurance coverage will need to be confirmed)  03/08/2025  . TETANUS/TDAP  05/24/2028  . INFLUENZA VACCINE  Completed  . Hepatitis C Screening  Completed  . PNA vac Low Risk Adult  Completed   Immunization History  Administered Date(s) Administered  . Fluad Quad(high Dose 65+) 04/02/2019  . Influenza-Unspecified 04/03/2014, 03/31/2015, 03/28/2016, 03/12/2020  . PFIZER(Purple Top)SARS-COV-2 Vaccination 08/02/2019, 08/28/2019  . Pneumococcal Conjugate-13 08/07/2020  . Pneumococcal Polysaccharide-23 01/23/2015, 04/02/2019  . Td 05/21/2008  . Tdap 05/24/2018  . Zoster 01/23/2015    These are the patient goals that we  discussed: Goals Addressed              This Visit's Progress   .  Patient Stated (pt-stated)        08/14/2020 AWV Goal: Diabetes Management  . Patient will maintain an A1C level below 7.5 . Patient will not develop any diabetic foot complications . Patient will not experience any hypoglycemic episodes over the next 3 months . Patient will notify our office of any CBG readings outside of the provider recommended range by calling [phone] . Patient will adhere to provider recommendations for diabetes management  Patient Self Management Activities . take all medications as prescribed and report any negative side effects . monitor and record blood sugar readings as directed . adhere to a low carbohydrate diet that incorporates lean proteins, vegetables, whole grains, low glycemic  fruits . check feet daily noting any sores, cracks, injuries, or callous formations . see PCP or podiatrist if he notices any changes in his legs, feet, or toenails . Patient will visit PCP and have an A1C level checked every 3 to 6 months as directed  . have a yearly eye exam to monitor for vascular changes associated with diabetes and will request that the report be sent to his pcp.  . consult with his PCP regarding any changes in his health or new or worsening symptoms         This is a list of Health Maintenance Items that are overdue or due now: Health Maintenance Due  Topic Date Due  . OPHTHALMOLOGY EXAM  05/07/2020     Orders/Referrals Placed Today: Orders Placed This Encounter  Procedures  . US AORTA    Order Specific Question:   Reason for Exam (SYMPTOM  OR DIAGNOSIS REQUIRED)    Answer:   Aortic ultrasound for aneurysm screening, greater than 100 cigarettes smoked    Order Specific Question:   Preferred imaging location?    Answer:   [facility]    Order Specific Question:   Release to patient    Answer:   Immediate     Follow-up Plan . Follow-up with Monica Becton, MD as planned . Schedule your appointment with the pharmacy for your shingrix appointment.

## 2020-08-19 ENCOUNTER — Other Ambulatory Visit: Payer: Self-pay

## 2020-08-19 ENCOUNTER — Ambulatory Visit (INDEPENDENT_AMBULATORY_CARE_PROVIDER_SITE_OTHER): Payer: Medicare HMO

## 2020-08-19 ENCOUNTER — Other Ambulatory Visit: Payer: Medicare HMO

## 2020-08-19 DIAGNOSIS — Z136 Encounter for screening for cardiovascular disorders: Secondary | ICD-10-CM

## 2020-08-19 DIAGNOSIS — Z87891 Personal history of nicotine dependence: Secondary | ICD-10-CM | POA: Diagnosis not present

## 2020-09-08 ENCOUNTER — Other Ambulatory Visit: Payer: Self-pay

## 2020-09-08 MED ORDER — GABAPENTIN 300 MG PO CAPS: ORAL_CAPSULE | ORAL | 1 refills | Status: DC

## 2020-10-28 DIAGNOSIS — E119 Type 2 diabetes mellitus without complications: Secondary | ICD-10-CM | POA: Diagnosis not present

## 2020-10-28 DIAGNOSIS — E7800 Pure hypercholesterolemia, unspecified: Secondary | ICD-10-CM | POA: Diagnosis not present

## 2020-10-28 DIAGNOSIS — Z0100 Encounter for examination of eyes and vision without abnormal findings: Secondary | ICD-10-CM | POA: Diagnosis not present

## 2020-10-28 DIAGNOSIS — H524 Presbyopia: Secondary | ICD-10-CM | POA: Diagnosis not present

## 2020-10-28 DIAGNOSIS — I10 Essential (primary) hypertension: Secondary | ICD-10-CM | POA: Diagnosis not present

## 2020-10-28 LAB — HM DIABETES EYE EXAM

## 2020-11-02 ENCOUNTER — Telehealth: Payer: Self-pay | Admitting: Sports Medicine

## 2020-11-05 NOTE — Telephone Encounter (Signed)
Error

## 2020-11-10 ENCOUNTER — Telehealth: Payer: Self-pay

## 2020-11-10 DIAGNOSIS — Z0000 Encounter for general adult medical examination without abnormal findings: Secondary | ICD-10-CM

## 2020-11-10 MED ORDER — SHINGRIX 50 MCG/0.5ML IM SUSR: 0.5000 mL | Freq: Once | INTRAMUSCULAR | 0 refills | Status: AC

## 2020-11-10 NOTE — Telephone Encounter (Signed)
Patient called stating he would like to get the Shingles vaccine series. He reports getting the Zoster vaccine years ago. Please advise.

## 2020-11-10 NOTE — Telephone Encounter (Signed)
Great idea, sending the Shingrix vaccine to his pharmacy, it will be cheaper to get it done at the pharmacy.

## 2020-11-10 NOTE — Telephone Encounter (Signed)
Appt for Shringix Scheduled for Nurse Visit. AM

## 2020-11-10 NOTE — Telephone Encounter (Signed)
Please call patient to schedule nurse visit for shingrix.

## 2020-11-10 NOTE — Telephone Encounter (Signed)
That is a first for me, he can go ahead and schedule his 2 nurse visits for his Shingrix vaccines.

## 2020-11-10 NOTE — Telephone Encounter (Signed)
Spoke with patient and wife, they stated that their insurance told then if they got the shot at the dr office it would be a zero dollar copay. At the pharmacy, it would be $50 or more.

## 2020-11-11 ENCOUNTER — Ambulatory Visit (INDEPENDENT_AMBULATORY_CARE_PROVIDER_SITE_OTHER): Payer: Medicare HMO | Admitting: Sports Medicine

## 2020-11-11 ENCOUNTER — Other Ambulatory Visit: Payer: Self-pay

## 2020-11-11 VITALS — BP 138/70 | HR 70 | Temp 98.0°F | Resp 20 | Ht 73.0 in | Wt 242.0 lb

## 2020-11-11 DIAGNOSIS — Z23 Encounter for immunization: Secondary | ICD-10-CM

## 2020-11-11 NOTE — Progress Notes (Signed)
Established Patient Office Visit  Subjective:  Patient ID: Robert Hines, male    DOB: 1955/10/05  Age: 65 y.o. MRN: [id number]  CC:  Chief Complaint  Patient presents with  . Immunizations    HPI [name] presents for a Shingles vaccine. Vaccine given in left deltoid, pt tolerated well with no immediate complications. Pt will return in 2-6 months for next shingles vaccine.  Past Medical History:  Diagnosis Date  . Arthritis    neck  . COVID-19   . Depression   . Diabetes mellitus without complication (HCC)    type 2  . Hyperlipidemia   . Hypertension   . Pneumonia    covid pneumonia and covid july 2020, d/ced july 16th from [facility] stayed 9 days  . Renal disorder    calculi, multiple lithotripsies, stents  . Right cataract   . Rotator cuff disorder   . Spinal pain     Past Surgical History:  Procedure Laterality Date  . ANTERIOR FUSION CERVICAL SPINE  2007  . ARTHOSCOPIC ROTAOR CUFF REPAIR Left 03/21/2019   Procedure: ARTHROSCOPIC ROTATOR CUFF REPAIR;  Surgeon: Jones Broom, MD;  Location: [facility];  Service: Orthopedics;  Laterality: Left;  . EYE SURGERY Left    cataract  . FOOT SURGERY Right    pin placed and removed  . HERNIA REPAIR     inguinal  . LITHOTRIPSY     several  . SHOULDER ARTHROSCOPY WITH ROTATOR CUFF REPAIR AND SUBACROMIAL DECOMPRESSION Right 12/28/2015   Procedure: SHOULDER ARTHROSCOPY TAKE DOWN ROTATOR CUFF REPAIR AND SUBACROMIAL DECOMPRESSION, DEBRIDEMENT OF SLAP TEAR;  Surgeon: Jones Broom, MD;  Location: [facility];  Service: Orthopedics;  Laterality: Right;  SHOULDER ARTHROSCOPY TAKE DOWN ROTATOR CUFF REPAIR AND SUBACROMIAL DECOMPRESSION  . SHOULDER ARTHROSCOPY WITH SUBACROMIAL DECOMPRESSION Left 03/21/2019   Procedure: SHOULDER ARTHROSCOPY WITH SUBACROMIAL DECOMPRESSION;  Surgeon: Jones Broom, MD;  Location: [facility] [place];  Service: Orthopedics;  Laterality: Left;  [name]  VASECTOMY Bilateral     Family History  Problem Relation Age of Onset  . Hypertension Mother   . Heart failure Father   . Diabetes Father   . Hypertension Father   . Heart failure Sister   . Hypertension Sister     Social History   Socioeconomic History  . Marital status: Married    Spouse name: Robert Hines)  . Number of children: 2  . Years of education: 12th grade  . Highest education level: High school graduate  Occupational History  . Occupation: Retired  Tobacco Use  . Smoking status: Former Smoker    Types: Cigarettes    Quit date: 10/01/2018    Years since quitting: 2.1  . Smokeless tobacco: Never Used  Vaping Use  . Vaping Use: Never used  Substance and Sexual Activity  . Alcohol use: Yes    Alcohol/week: 0.0 standard drinks    Comment: social  . Drug use: No  . Sexual activity: Yes    Birth control/protection: None  Other Topics Concern  . Not on file  Social History Narrative   Lives with his wife. He has recently retired and is working on finding new hobbies. He loves to do things outdoors such as gardening or fishing.    Social Determinants of Health   Financial Resource Strain: Low Risk   . Difficulty of Paying Living Expenses: Not hard at all  Food Insecurity: No Food Insecurity  . Worried About Programme researcher, broadcasting/film/video  in the Last Year: Never true  . Ran Out of Food in the Last Year: Never true  Transportation Needs: No Transportation Needs  . Lack of Transportation (Medical): No  . Lack of Transportation (Non-Medical): No  Physical Activity: Inactive  . Days of Exercise per Week: 0 days  . Minutes of Exercise per Session: 0 min  Stress: No Stress Concern Present  . Feeling of Stress : Not at all  Social Connections: Moderately Isolated  . Frequency of Communication with Friends and Family: More than three times a week  . Frequency of Social Gatherings with Friends and Family: Once a week  . Attends Religious Services: Never  . Active Member of  Clubs or Organizations: No  . Attends Banker Meetings: Never  . Marital Status: Married  Catering manager Violence: Not At Risk  . Fear of Current or Ex-Partner: No  . Emotionally Abused: No  . Physically Abused: No  . Sexually Abused: No    Outpatient Medications Prior to Visit  Medication Sig Dispense Refill  . aspirin 81 MG chewable tablet Chew by mouth daily.    [name] buPROPion (WELLBUTRIN XL) 300 MG 24 hr tablet Take 1 tablet (300 mg total) by mouth daily. 30 tablet 11  . cyclobenzaprine (FLEXERIL) 10 MG tablet Take 10 mg by mouth 3 (three) times daily as needed for muscle spasms.    . Dapagliflozin-metFORMIN HCl ER (XIGDUO XR) 03-999 MG TB24 Take 1 tablet by mouth daily. 30 tablet 11  . Dulaglutide (TRULICITY) 1.5 MG/0.5ML SOPN Wednesday in pm 2 mL 11  . gabapentin (NEURONTIN) 300 MG capsule TAKE 3 CAPSULES IN THE MORNING AND 4 CAPSULES IN THE EVENING 630 capsule 1  . Insulin Glargine (BASAGLAR KWIKPEN) 100 UNIT/ML Inject 94 Units into the skin at bedtime. appt for refills (Patient taking differently: Inject 94 Units into the skin at bedtime. Takes 92 units) 90 mL 11  . Insulin Pen Needle 32G X 4 MM MISC Use as needed with toujeo pen 300 each 4  . ketorolac (TORADOL) 10 MG tablet Take 1 tablet (10 mg total) by mouth every 8 (eight) hours as needed. 20 tablet 11  . lisinopril (ZESTRIL) 20 MG tablet Take 1 tablet (20 mg total) by mouth daily. 30 tablet 11  . simvastatin (ZOCOR) 40 MG tablet Take 1 tablet (40 mg total) by mouth daily. 30 tablet 11  . zolpidem (AMBIEN) 10 MG tablet TAKE 1 TABLET BY MOUTH AT BEDTIME AS NEEDED SLEEP 30 tablet 3   No facility-administered medications prior to visit.    Allergies  Allergen Reactions  . Augmentin [Amoxicillin-Pot Clavulanate] Diarrhea  . Codeine Diarrhea and Nausea Only  . Duloxetine Diarrhea and Nausea Only    cymbalta is rash    ROS Review of Systems    Objective:   Vaccine given in left deltoid, pt tolerated well  with no immediate complications. Pt will return in 2-6 months for next shingles vaccine. Physical Exam  BP 138/70 (BP Location: Left Arm, Patient Position: Sitting, Cuff Size: Large)   Pulse 70   Temp 98 F (36.7 C) (Oral)   Resp 20   Ht 6\' 1"  (1.854 m)   Wt 242 lb (109.8 kg)   SpO2 98%   BMI 31.93 kg/m  Wt Readings from Last 3 Encounters:  11/11/20 242 lb (109.8 kg)  08/14/20 242 lb 1.9 oz (109.8 kg)  08/07/20 242 lb (109.8 kg)     Health Maintenance Due  Topic Date Due  .  COVID-19 Vaccine (3 - Booster for Pfizer series) 01/28/2020  . OPHTHALMOLOGY EXAM  05/07/2020    There are no preventive care reminders to display for this patient.  Lab Results  Component Value Date   TSH 4.82 (H) 08/12/2020   Lab Results  Component Value Date   WBC 6.2 08/12/2020   HGB 15.3 08/12/2020   HCT 44.6 08/12/2020   MCV 97.6 08/12/2020   PLT 97 (L) 08/12/2020   Lab Results  Component Value Date   NA 139 08/12/2020   K 4.1 08/12/2020   CO2 27 08/12/2020   GLUCOSE 116 (H) 08/12/2020   BUN 15 08/12/2020   CREATININE 0.96 08/12/2020   BILITOT 0.4 08/12/2020   ALKPHOS 55 06/23/2016   AST 33 08/12/2020   ALT 53 (H) 08/12/2020   PROT 6.5 08/12/2020   ALBUMIN 4.1 06/23/2016   CALCIUM 9.3 08/12/2020   ANIONGAP 9 12/31/2015   Lab Results  Component Value Date   CHOL 171 08/12/2020   Lab Results  Component Value Date   HDL 45 08/12/2020   Lab Results  Component Value Date   LDLCALC 95 08/12/2020   Lab Results  Component Value Date   TRIG 217 (H) 08/12/2020   Lab Results  Component Value Date   CHOLHDL 3.8 08/12/2020   Lab Results  Component Value Date   HGBA1C 7.7 (H) 08/12/2020      Assessment & Plan:   Problem List Items Addressed This Visit   None   Visit Diagnoses    Need for shingles vaccine    -  Primary   Relevant Orders   Varicella-zoster vaccine IM (Shingrix) (Completed)      No orders of the defined types were placed in this  encounter.   Follow-up: Return 2-6 Months, for next shingles vaccine.    Kathrynn Speed, CMA

## 2020-11-24 ENCOUNTER — Encounter: Payer: Self-pay | Admitting: Sports Medicine

## 2020-12-30 ENCOUNTER — Other Ambulatory Visit: Payer: Self-pay

## 2020-12-30 ENCOUNTER — Ambulatory Visit (INDEPENDENT_AMBULATORY_CARE_PROVIDER_SITE_OTHER): Payer: Medicare HMO | Admitting: Family Medicine

## 2020-12-30 VITALS — BP 140/70 | HR 74 | Resp 20 | Ht 73.0 in | Wt 242.0 lb

## 2020-12-30 DIAGNOSIS — Z23 Encounter for immunization: Secondary | ICD-10-CM

## 2020-12-30 NOTE — Progress Notes (Signed)
Established Patient Office Visit  Subjective:  Patient ID: Robert Hines, male    DOB: 03-15-1956  Age: 65 y.o. MRN: [id number]  CC:  Chief Complaint  Patient presents with   Immunizations    HPI Robert Hines presents for his second shingles vaccine. Injection given in left deltoid, pt tolerated well with no immediate complications.   Past Medical History:  Diagnosis Date   Arthritis    neck   COVID-19    Depression    Diabetes mellitus without complication (HCC)    type 2   Hyperlipidemia    Hypertension    Pneumonia    covid pneumonia and covid july 2020, d/ced july 16th from [facility] stayed 9 days   Renal disorder    calculi, multiple lithotripsies, stents   Right cataract    Rotator cuff disorder    Spinal pain     Past Surgical History:  Procedure Laterality Date   ANTERIOR FUSION CERVICAL SPINE  2007   ARTHOSCOPIC ROTAOR CUFF REPAIR Left 03/21/2019   Procedure: ARTHROSCOPIC ROTATOR CUFF REPAIR;  Surgeon: Jones Broom, MD;  Location: [facility];  Service: Orthopedics;  Laterality: Left;   EYE SURGERY Left    cataract   FOOT SURGERY Right    pin placed and removed   HERNIA REPAIR     inguinal   LITHOTRIPSY     several   SHOULDER ARTHROSCOPY WITH ROTATOR CUFF REPAIR AND SUBACROMIAL DECOMPRESSION Right 12/28/2015   Procedure: SHOULDER ARTHROSCOPY TAKE DOWN ROTATOR CUFF REPAIR AND SUBACROMIAL DECOMPRESSION, DEBRIDEMENT OF SLAP TEAR;  Surgeon: Jones Broom, MD;  Location: [facility];  Service: Orthopedics;  Laterality: Right;  SHOULDER ARTHROSCOPY TAKE DOWN ROTATOR CUFF REPAIR AND SUBACROMIAL DECOMPRESSION   SHOULDER ARTHROSCOPY WITH SUBACROMIAL DECOMPRESSION Left 03/21/2019   Procedure: SHOULDER ARTHROSCOPY WITH SUBACROMIAL DECOMPRESSION;  Surgeon: Jones Broom, MD;  Location: [facility] [place];  Service: Orthopedics;  Laterality: Left;   VASECTOMY Bilateral     Family History  Problem Relation Age  of Onset   Hypertension Mother    Heart failure Father    Diabetes Father    Hypertension Father    Heart failure Sister    Hypertension Sister     Social History   Socioeconomic History   Marital status: Married    Spouse name: Meriam Sprague Dewayne Hatch)   Number of children: 2   Years of education: 12th grade   Highest education level: High school graduate  Occupational History   Occupation: Retired  Tobacco Use   Smoking status: Former    Pack years: 0.00    Types: Cigarettes    Quit date: 10/01/2018    Years since quitting: 2.2   Smokeless tobacco: Never  Vaping Use   Vaping Use: Never used  Substance and Sexual Activity   Alcohol use: Yes    Alcohol/week: 0.0 standard drinks    Comment: social   Drug use: No   Sexual activity: Yes    Birth control/protection: None  Other Topics Concern   Not on file  Social History Narrative   Lives with his wife. He has recently retired and is working on finding new hobbies. He loves to do things outdoors such as gardening or fishing.    Social Determinants of Health   Financial Resource Strain: Low Risk    Difficulty of Paying Living Expenses: Not hard at all  Food Insecurity: No Food Insecurity   Worried About Programme researcher, broadcasting/film/video in the Last Year:  Never true   Ran Out of Food in the Last Year: Never true  Transportation Needs: No Transportation Needs   Lack of Transportation (Medical): No   Lack of Transportation (Non-Medical): No  Physical Activity: Inactive   Days of Exercise per Week: 0 days   Minutes of Exercise per Session: 0 min  Stress: No Stress Concern Present   Feeling of Stress : Not at all  Social Connections: Moderately Isolated   Frequency of Communication with Friends and Family: More than three times a week   Frequency of Social Gatherings with Friends and Family: Once a week   Attends Religious Services: Never   Database administrator or Organizations: No   Attends Engineer, structural: Never   Marital  Status: Married  Catering manager Violence: Not At Risk   Fear of Current or Ex-Partner: No   Emotionally Abused: No   Physically Abused: No   Sexually Abused: No    Outpatient Medications Prior to Visit  Medication Sig Dispense Refill   aspirin 81 MG chewable tablet Chew by mouth daily.     buPROPion (WELLBUTRIN XL) 300 MG 24 hr tablet Take 1 tablet (300 mg total) by mouth daily. 30 tablet 11   cyclobenzaprine (FLEXERIL) 10 MG tablet Take 10 mg by mouth 3 (three) times daily as needed for muscle spasms.     Dapagliflozin-metFORMIN HCl ER (XIGDUO XR) 03-999 MG TB24 Take 1 tablet by mouth daily. 30 tablet 11   Dulaglutide (TRULICITY) 1.5 MG/0.5ML SOPN Wednesday in pm 2 mL 11   gabapentin (NEURONTIN) 300 MG capsule TAKE 3 CAPSULES IN THE MORNING AND 4 CAPSULES IN THE EVENING 630 capsule 1   Insulin Glargine (BASAGLAR KWIKPEN) 100 UNIT/ML Inject 94 Units into the skin at bedtime. appt for refills (Patient taking differently: Inject 94 Units into the skin at bedtime. Takes 92 units) 90 mL 11   Insulin Pen Needle 32G X 4 MM MISC Use as needed with toujeo pen 300 each 4   ketorolac (TORADOL) 10 MG tablet Take 1 tablet (10 mg total) by mouth every 8 (eight) hours as needed. 20 tablet 11   lisinopril (ZESTRIL) 20 MG tablet Take 1 tablet (20 mg total) by mouth daily. 30 tablet 11   simvastatin (ZOCOR) 40 MG tablet Take 1 tablet (40 mg total) by mouth daily. 30 tablet 11   zolpidem (AMBIEN) 10 MG tablet TAKE 1 TABLET BY MOUTH AT BEDTIME AS NEEDED SLEEP 30 tablet 3   No facility-administered medications prior to visit.    Allergies  Allergen Reactions   Augmentin [Amoxicillin-Pot Clavulanate] Diarrhea   Codeine Diarrhea and Nausea Only   Duloxetine Diarrhea and Nausea Only    cymbalta is rash    ROS Review of Systems    Objective:    Physical Exam  BP 140/70 (BP Location: Left Arm, Patient Position: Sitting, Cuff Size: Large)   Pulse 74   Resp 20   Ht 6\' 1"  (1.854 m)   Wt 242 lb  (109.8 kg)   SpO2 99%   BMI 31.93 kg/m  Wt Readings from Last 3 Encounters:  12/30/20 242 lb (109.8 kg)  11/11/20 242 lb (109.8 kg)  08/14/20 242 lb 1.9 oz (109.8 kg)     Health Maintenance Due  Topic Date Due   COVID-19 Vaccine (3 - Booster for Pfizer series) 01/28/2020    There are no preventive care reminders to display for this patient.  Lab Results  Component Value Date   TSH  4.82 (H) 08/12/2020   Lab Results  Component Value Date   WBC 6.2 08/12/2020   HGB 15.3 08/12/2020   HCT 44.6 08/12/2020   MCV 97.6 08/12/2020   PLT 97 (L) 08/12/2020   Lab Results  Component Value Date   NA 139 08/12/2020   K 4.1 08/12/2020   CO2 27 08/12/2020   GLUCOSE 116 (H) 08/12/2020   BUN 15 08/12/2020   CREATININE 0.96 08/12/2020   BILITOT 0.4 08/12/2020   ALKPHOS 55 06/23/2016   AST 33 08/12/2020   ALT 53 (H) 08/12/2020   PROT 6.5 08/12/2020   ALBUMIN 4.1 06/23/2016   CALCIUM 9.3 08/12/2020   ANIONGAP 9 12/31/2015   Lab Results  Component Value Date   CHOL 171 08/12/2020   Lab Results  Component Value Date   HDL 45 08/12/2020   Lab Results  Component Value Date   LDLCALC 95 08/12/2020   Lab Results  Component Value Date   TRIG 217 (H) 08/12/2020   Lab Results  Component Value Date   CHOLHDL 3.8 08/12/2020   Lab Results  Component Value Date   HGBA1C 7.7 (H) 08/12/2020      Assessment & Plan:  Injection given in left deltoid, pt tolerated well with no immediate complications.  Problem List Items Addressed This Visit   None Visit Diagnoses     Need for shingles vaccine    -  Primary   Relevant Orders   Varicella-zoster vaccine IM (Shingrix) (Completed)       No orders of the defined types were placed in this encounter.   Follow-up: No follow-ups on file.    Kathrynn Speed, CMA

## 2020-12-30 NOTE — Progress Notes (Signed)
Medical screening examination/treatment was performed by qualified clinical staff member and as supervising physician I was immediately available for consultation/collaboration. I have reviewed documentation and agree with assessment and plan.  Conrado Nance, DO  

## 2021-02-18 ENCOUNTER — Other Ambulatory Visit: Payer: Self-pay | Admitting: Sports Medicine

## 2021-03-05 ENCOUNTER — Telehealth: Payer: Self-pay

## 2021-03-05 DIAGNOSIS — Z596 Low income: Secondary | ICD-10-CM

## 2021-03-05 NOTE — Progress Notes (Signed)
[facility])                                            [facility] Quality Pharmacy Team                                        Statin Quality Measure Assessment    03/05/2021  Robert Hines [birth year] [id number]  Per review of chart and payor information, this patient has been flagged for non-adherence to the following CMS Quality Measure:   [x]  Statin Use in Persons with Diabetes  []  Statin Use in Persons with Cardiovascular Disease  The 10-year ASCVD risk score (Arnett DK, et al., 2019) is: 31.2%   Values used to calculate the score:     Age: 65 years     Sex: Male     Is Non-Hispanic African American: No     Diabetic: Yes     Tobacco smoker: No     Systolic Blood Pressure: 140 mmHg     Is BP treated: Yes     HDL Cholesterol: 45 mg/dL     Total Cholesterol: 171 mg/dL LDL 95 mg/dL 2020  Currently prescribed statin:  [x]  Yes []  No     Comments: Simvastatin 40 mg - per pharmacy claims patient has not filled this medication since last year. I called [facility] and medication was filled with a discount card; pharmacy staff was informed I would need consent from patient to bill through insurance. However, pharmacist filled prescription and stated that it could be returned if patient doesn't pick up the prescription.   When I was able to talk to the patient, it was noted simvastatin was billed via discount card because he was in the [facility] Coverage Gap. Upon further discussion, his wife noted that several of his medications for diabetes are expensive: Trulicity $250/per month (3 pens remaining), Basaglar $500/month (3 weeks supply left); Xigduo - $512/month (has ~ 60 tablets remaining). His wife ) doesn't want to switch insulins since "this is the only one that works for him".She also stated they tried to seek financial assistance for the aforementioned medications but does not qualify d/t income. Patient lives in  a household of two people and income is >400% [employer]. However, they anticipate their income will decrease next year. I suggested to the patient talk to their PCP next year about medication assistance programs, if applicable, and provided names of foundation apply for next year.     Thank you for your time,  , PharmD Clinical Pharmacist [facility] Cell: [phone]

## 2021-04-05 ENCOUNTER — Other Ambulatory Visit: Payer: Self-pay | Admitting: Sports Medicine

## 2021-04-28 ENCOUNTER — Other Ambulatory Visit: Payer: Self-pay

## 2021-04-28 ENCOUNTER — Ambulatory Visit (INDEPENDENT_AMBULATORY_CARE_PROVIDER_SITE_OTHER): Payer: Medicare HMO

## 2021-04-28 ENCOUNTER — Ambulatory Visit (INDEPENDENT_AMBULATORY_CARE_PROVIDER_SITE_OTHER): Payer: Medicare HMO | Admitting: Sports Medicine

## 2021-04-28 DIAGNOSIS — L97521 Non-pressure chronic ulcer of other part of left foot limited to breakdown of skin: Secondary | ICD-10-CM

## 2021-04-28 DIAGNOSIS — M1612 Unilateral primary osteoarthritis, left hip: Secondary | ICD-10-CM

## 2021-04-28 DIAGNOSIS — E11621 Type 2 diabetes mellitus with foot ulcer: Secondary | ICD-10-CM | POA: Insufficient documentation

## 2021-04-28 DIAGNOSIS — M2012 Hallux valgus (acquired), left foot: Secondary | ICD-10-CM | POA: Diagnosis not present

## 2021-04-28 DIAGNOSIS — M25552 Pain in left hip: Secondary | ICD-10-CM | POA: Diagnosis not present

## 2021-04-28 HISTORY — DX: Type 2 diabetes mellitus with foot ulcer: E11.621

## 2021-04-28 MED ORDER — DOXYCYCLINE HYCLATE 100 MG PO TABS: 100.0000 mg | ORAL_TABLET | Freq: Two times a day (BID) | ORAL | 0 refills | Status: AC

## 2021-04-28 MED ORDER — TRAMADOL HCL 50 MG PO TABS: 50.0000 mg | ORAL_TABLET | Freq: Three times a day (TID) | ORAL | 0 refills | Status: DC | PRN

## 2021-04-28 NOTE — Assessment & Plan Note (Signed)
Pain with internal rotation, loss of motion, adding x-rays, hip conditioning, tramadol. Return to see me in 4-6 weeks for injection if not better.

## 2021-04-28 NOTE — Progress Notes (Signed)
    Procedures performed today:    None.  Independent interpretation of notes and tests performed by another provider:   None.  Brief History, Exam, Impression, and Recommendations:    Diabetic ulcer of left foot ([place]) This is a pleasant 65 year old male, he has had about a 40 history of what appears to be an ulcer over the dorsum of his left second PIP. He does have some surrounding erythema, he does have significant peripheral diabetic neuropathy so it is tough to assess pain. With the surrounding erythema, swelling I do have a concern for osteomyelitis, we will get an ESR, x-ray, I would like to add doxycycline at least although sometimes and below the diaphragm diabetic infections we do need to triple cover. This is however mild so we will start with doxycycline, I would also like an opinion from Dr. Blenda Mounts downstairs. In the meantime he needs to keep it covered.  Primary osteoarthritis of left hip Pain with internal rotation, loss of motion, adding x-rays, hip conditioning, tramadol. Return to see me in 4-6 weeks for injection if not better.    ___________________________________________ Gwen Her. Dianah Field, M.D., ABFM., CAQSM. Primary Care and [place] Instructor of [place] [employer] [facility] of Medicine

## 2021-04-28 NOTE — Assessment & Plan Note (Signed)
This is a pleasant 65 year old male, he has had about a 40 history of what appears to be an ulcer over the dorsum of his left second PIP. He does have some surrounding erythema, he does have significant peripheral diabetic neuropathy so it is tough to assess pain. With the surrounding erythema, swelling I do have a concern for osteomyelitis, we will get an ESR, x-ray, I would like to add doxycycline at least although sometimes and below the diaphragm diabetic infections we do need to triple cover. This is however mild so we will start with doxycycline, I would also like an opinion from Dr. Blenda Mounts downstairs. In the meantime he needs to keep it covered.

## 2021-04-29 LAB — COMPREHENSIVE METABOLIC PANEL
AG Ratio: 1.9 (calc) (ref 1.0–2.5)
ALT: 43 U/L (ref 9–46)
AST: 29 U/L (ref 10–35)
Albumin: 4.2 g/dL (ref 3.6–5.1)
Alkaline phosphatase (APISO): 69 U/L (ref 35–144)
BUN: 13 mg/dL (ref 7–25)
CO2: 24 mmol/L (ref 20–32)
Calcium: 9.1 mg/dL (ref 8.6–10.3)
Chloride: 104 mmol/L (ref 98–110)
Creat: 0.98 mg/dL (ref 0.70–1.35)
Globulin: 2.2 g/dL (calc) (ref 1.9–3.7)
Glucose, Bld: 180 mg/dL — ABNORMAL HIGH (ref 65–99)
Potassium: 4.1 mmol/L (ref 3.5–5.3)
Sodium: 140 mmol/L (ref 135–146)
Total Bilirubin: 0.4 mg/dL (ref 0.2–1.2)
Total Protein: 6.4 g/dL (ref 6.1–8.1)

## 2021-04-29 LAB — SEDIMENTATION RATE: Sed Rate: 11 mm/h (ref 0–20)

## 2021-04-29 LAB — CBC WITH DIFFERENTIAL/PLATELET
Absolute Monocytes: 460 cells/uL (ref 200–950)
Basophils Absolute: 38 cells/uL (ref 0–200)
Basophils Relative: 0.6 %
Eosinophils Absolute: 88 cells/uL (ref 15–500)
Eosinophils Relative: 1.4 %
HCT: 44.4 % (ref 38.5–50.0)
Hemoglobin: 15.1 g/dL (ref 13.2–17.1)
Lymphs Abs: 2596 cells/uL (ref 850–3900)
MCH: 34.5 pg — ABNORMAL HIGH (ref 27.0–33.0)
MCHC: 34 g/dL (ref 32.0–36.0)
MCV: 101.4 fL — ABNORMAL HIGH (ref 80.0–100.0)
MPV: 11.7 fL (ref 7.5–12.5)
Monocytes Relative: 7.3 %
Neutro Abs: 3119 cells/uL (ref 1500–7800)
Neutrophils Relative %: 49.5 %
Platelets: 101 10*3/uL — ABNORMAL LOW (ref 140–400)
RBC: 4.38 10*6/uL (ref 4.20–5.80)
RDW: 13.4 % (ref 11.0–15.0)
Total Lymphocyte: 41.2 %
WBC: 6.3 10*3/uL (ref 3.8–10.8)

## 2021-04-29 LAB — HEMOGLOBIN A1C
Hgb A1c MFr Bld: 6.9 % of total Hgb — ABNORMAL HIGH (ref <5.7)
Mean Plasma Glucose: 151 mg/dL
eAG (mmol/L): 8.4 mmol/L

## 2021-04-30 ENCOUNTER — Ambulatory Visit: Payer: Medicare HMO | Admitting: Podiatry

## 2021-04-30 ENCOUNTER — Encounter: Payer: Self-pay | Admitting: Podiatry

## 2021-04-30 ENCOUNTER — Other Ambulatory Visit: Payer: Self-pay

## 2021-04-30 DIAGNOSIS — E756 Lipid storage disorder, unspecified: Secondary | ICD-10-CM | POA: Insufficient documentation

## 2021-04-30 DIAGNOSIS — E11621 Type 2 diabetes mellitus with foot ulcer: Secondary | ICD-10-CM

## 2021-04-30 DIAGNOSIS — M79675 Pain in left toe(s): Secondary | ICD-10-CM

## 2021-04-30 DIAGNOSIS — E119 Type 2 diabetes mellitus without complications: Secondary | ICD-10-CM

## 2021-04-30 DIAGNOSIS — E1140 Type 2 diabetes mellitus with diabetic neuropathy, unspecified: Secondary | ICD-10-CM | POA: Insufficient documentation

## 2021-04-30 DIAGNOSIS — M79674 Pain in right toe(s): Secondary | ICD-10-CM | POA: Diagnosis not present

## 2021-04-30 DIAGNOSIS — L97511 Non-pressure chronic ulcer of other part of right foot limited to breakdown of skin: Secondary | ICD-10-CM | POA: Diagnosis not present

## 2021-04-30 DIAGNOSIS — B351 Tinea unguium: Secondary | ICD-10-CM

## 2021-04-30 DIAGNOSIS — M545 Low back pain, unspecified: Secondary | ICD-10-CM | POA: Insufficient documentation

## 2021-04-30 DIAGNOSIS — Z794 Long term (current) use of insulin: Secondary | ICD-10-CM | POA: Diagnosis not present

## 2021-04-30 DIAGNOSIS — M47816 Spondylosis without myelopathy or radiculopathy, lumbar region: Secondary | ICD-10-CM | POA: Insufficient documentation

## 2021-04-30 NOTE — Progress Notes (Signed)
Subjective:  Patient ID: Robert Hines, male    DOB: 02-Oct-1955,   MRN: [id number]  Chief Complaint  Patient presents with   Foot Ulcer    I have a spot on the top of the 2nd toe left and has been going on for about 5 to 6 days and I have saw Dr T on Wednesday and the doctor prescribed an antibiotic for me and referred me to the foot doctor    65 y.o. male presents for ulcer of left great toe. Referred here by Dr. Benjamin Stain. Patient relates he has had this ulcer for almost a month and a half. Has been dressing with antibiotic ointment and a bandaid. . Currently taking doxycycline.  Relates redness has improved.   . Denies any other pedal complaints. Denies n/v/f/c.   Past Medical History:  Diagnosis Date   Arthritis    neck   COVID-19    Depression    Diabetes mellitus without complication (HCC)    type 2   Hyperlipidemia    Hypertension    Pneumonia    covid pneumonia and covid july 2020, d/ced july 16th from [facility] stayed 9 days   Renal disorder    calculi, multiple lithotripsies, stents   Right cataract    Rotator cuff disorder    Spinal pain     Objective:  Physical Exam: Vascular: DP/PT pulses 2/4 bilateral. CFT <3 seconds. Normal hair growth on digits. No edema.  Skin. No lacerations or abrasions bilateral feet.  0.3 cm x 0.2 cm x 0.1 cm wound noted to dorsal second digit with granular base. Mild erythema surrounding. No purulence or edema. No probe to bone.  Musculoskeletal: MMT 5/5 bilateral lower extremities in DF, PF, Inversion and Eversion. Deceased ROM in DF of ankle joint.  Neurological: Sensation intact to light touch.   Assessment:   1. Diabetic ulcer of toe of right foot associated with type 2 diabetes mellitus, limited to breakdown of skin (HCC)   2. Type 2 diabetes mellitus without complication, with long-term current use of insulin (HCC)   3. Onychomycosis   4. Pain due to onychomycosis of toenails of both feet      Plan:  Patient was  evaluated and treated and all questions answered. Ulcer right second digit limited to breakdown of skin.  -Debridement as below. -Dressed with silvadene, DSD. -Continue with doxycycline  -Discussed glucose control and proper protein-rich diet.  -Discussed if any worsening redness, pain, fever or chills to call or may need to report to the emergency room. Patient expressed understanding.  -Discussed and educated patient on diabetic foot care, especially with  regards to the vascular, neurological and musculoskeletal systems.  -Stressed the importance of good glycemic control and the detriment of not  controlling glucose levels in relation to the foot. -Discussed supportive shoes at all times and checking feet regularly.  -Mechanically debrided all nails 1-5 bilateral using sterile nail nipper and filed with dremel without incident    Procedure: Excisional Debridement of Wound Rationale: Removal of non-viable soft tissue from the wound to promote healing.  Anesthesia: none Pre-Debridement Wound Measurements: 0.2 cm x 0.2 cm x 0.1 cm  Post-Debridement Wound Measurements: 0.3 cm x 0.2 cm x 0.1 cm  Type of Debridement: Sharp Excisional Tissue Removed: Non-viable soft tissue Depth of Debridement: subcutaneous tissue. Technique: Sharp excisional debridement to bleeding, viable wound base.  Dressing: Dry, sterile, compression dressing. Disposition: Patient tolerated procedure well. Patient to return in 2 week for follow-up.  Return in about 2 weeks (around 05/14/2021) for wound check.   Louann Sjogren, DPM

## 2021-05-21 ENCOUNTER — Ambulatory Visit: Payer: Medicare HMO | Admitting: Podiatry

## 2021-05-26 ENCOUNTER — Ambulatory Visit (INDEPENDENT_AMBULATORY_CARE_PROVIDER_SITE_OTHER): Payer: Medicare HMO | Admitting: Sports Medicine

## 2021-05-26 ENCOUNTER — Ambulatory Visit (INDEPENDENT_AMBULATORY_CARE_PROVIDER_SITE_OTHER): Payer: Medicare HMO

## 2021-05-26 ENCOUNTER — Other Ambulatory Visit: Payer: Self-pay

## 2021-05-26 DIAGNOSIS — L97521 Non-pressure chronic ulcer of other part of left foot limited to breakdown of skin: Secondary | ICD-10-CM | POA: Diagnosis not present

## 2021-05-26 DIAGNOSIS — G4719 Other hypersomnia: Secondary | ICD-10-CM

## 2021-05-26 DIAGNOSIS — M1612 Unilateral primary osteoarthritis, left hip: Secondary | ICD-10-CM

## 2021-05-26 DIAGNOSIS — E11621 Type 2 diabetes mellitus with foot ulcer: Secondary | ICD-10-CM | POA: Diagnosis not present

## 2021-05-26 NOTE — Assessment & Plan Note (Signed)
With apneic episodes at night per wife, does not wake feeling well rested, ordering sleep study.

## 2021-05-26 NOTE — Assessment & Plan Note (Signed)
Persistent pain in spite of conservative treatment, conditioning exercises, left hip joint injection today, return to see me in a month.

## 2021-05-26 NOTE — Assessment & Plan Note (Signed)
Resolved with antibiotics, we did get a second opinion from Dr. Ralene Cork downstairs, some debridement was performed, return as needed for this, he will keep a close eye on it.  We will also continue to work aggressively on his diabetes.

## 2021-05-26 NOTE — Progress Notes (Signed)
    Procedures performed today:    Procedure: Real-time Ultrasound Guided injection of the left hip joint Device: Samsung HS60  Verbal informed consent obtained.  Time-out conducted.  Noted no overlying erythema, induration, or other signs of local infection.  Skin prepped in a sterile fashion.  Local anesthesia: Topical Ethyl chloride.  With sterile technique and under real time ultrasound guidance: Noted arthritic hip joint, 1 cc Kenalog 40, 2 cc lidocaine, 2 cc bupivacaine injected easily Completed without difficulty  Advised to call if fevers/chills, erythema, induration, drainage, or persistent bleeding.  Images permanently stored and available for review in PACS.  Impression: Technically successful ultrasound guided injection.  Independent interpretation of notes and tests performed by another provider:   None.  Brief History, Exam, Impression, and Recommendations:    Primary osteoarthritis of left hip Persistent pain in spite of conservative treatment, conditioning exercises, left hip joint injection today, return to see me in a month.  Diabetic ulcer of left foot (HCC) Resolved with antibiotics, we did get a second opinion from Dr. Ralene Cork downstairs, some debridement was performed, return as needed for this, he will keep a close eye on it.  We will also continue to work aggressively on his diabetes.  Excessive daytime sleepiness With apneic episodes at night per wife, does not wake feeling well rested, ordering sleep study.    ___________________________________________ Ihor Austin. Benjamin Stain, M.D., ABFM., CAQSM. Primary Care and Sports Medicine  [facility]  Adjunct Instructor of Family Medicine  [employer]

## 2021-06-23 ENCOUNTER — Other Ambulatory Visit: Payer: Self-pay

## 2021-06-23 ENCOUNTER — Ambulatory Visit (INDEPENDENT_AMBULATORY_CARE_PROVIDER_SITE_OTHER): Payer: Medicare HMO

## 2021-06-23 ENCOUNTER — Ambulatory Visit (INDEPENDENT_AMBULATORY_CARE_PROVIDER_SITE_OTHER): Payer: Medicare HMO | Admitting: Sports Medicine

## 2021-06-23 DIAGNOSIS — G250 Essential tremor: Secondary | ICD-10-CM

## 2021-06-23 DIAGNOSIS — G4719 Other hypersomnia: Secondary | ICD-10-CM | POA: Diagnosis not present

## 2021-06-23 DIAGNOSIS — U071 COVID-19: Secondary | ICD-10-CM

## 2021-06-23 DIAGNOSIS — M545 Low back pain, unspecified: Secondary | ICD-10-CM

## 2021-06-23 DIAGNOSIS — Z794 Long term (current) use of insulin: Secondary | ICD-10-CM | POA: Diagnosis not present

## 2021-06-23 DIAGNOSIS — M47816 Spondylosis without myelopathy or radiculopathy, lumbar region: Secondary | ICD-10-CM

## 2021-06-23 DIAGNOSIS — E119 Type 2 diabetes mellitus without complications: Secondary | ICD-10-CM

## 2021-06-23 DIAGNOSIS — J1282 Pneumonia due to coronavirus disease 2019: Secondary | ICD-10-CM | POA: Diagnosis not present

## 2021-06-23 MED ORDER — GLIPIZIDE 5 MG PO TABS: 5.0000 mg | ORAL_TABLET | Freq: Every day | ORAL | 3 refills | Status: DC

## 2021-06-23 MED ORDER — METFORMIN HCL 1000 MG PO TABS: 1000.0000 mg | ORAL_TABLET | Freq: Two times a day (BID) | ORAL | 3 refills | Status: DC

## 2021-06-23 NOTE — Assessment & Plan Note (Signed)
Has still not been contacted regarding his sleep study.

## 2021-06-23 NOTE — Assessment & Plan Note (Signed)
Axial back pain, known multilevel lumbar DDD, facet arthritis, runs down right leg. Adding x-rays, he does endorse using a complementary and alternative medicine method called Reiki and this seemed to work well so I am happy for him to try this again, if it does not work we can switch back to modern medicine and get an MRI and consider epidurals.

## 2021-06-23 NOTE — Assessment & Plan Note (Signed)
Robert Hines does report an occasional tremor, particularly when trying to use fine motor skills. I have asked him to check his blood sugar during these episodes to prove that its not hypoglycemia, he also would like to try a trial off of Neurontin, I think this is acceptable though I think his tremor would likely worsen off of Neurontin. We will print out some information for him regarding benign essential tremors, if we do need to treat it we would likely need to use primidone.

## 2021-06-23 NOTE — Assessment & Plan Note (Signed)
Currently doing well with current medication regimen though his Davonna Belling tends to be closer to $500 when in the donut hole. When this happens we can use high-dose generic metformin and high-dose generic glipizide. I will go ahead and write him a prescription for this.

## 2021-06-23 NOTE — Assessment & Plan Note (Signed)
At the end of the visit reported some breathlessness when performing work around the house, this was a door handle complaint, so we will discuss this at a follow-up visit.

## 2021-06-23 NOTE — Progress Notes (Signed)
° ° °  Procedures performed today:    None.  Independent interpretation of notes and tests performed by another provider:   None.  Brief History, Exam, Impression, and Recommendations:    Diabetes mellitus, type 2 Currently doing well with current medication regimen though his XigDuo tends to be closer to $500 when in the donut hole. When this happens we can use high-dose generic metformin and high-dose generic glipizide. I will go ahead and write him a prescription for this.  Benign essential tremor Ryn does report an occasional tremor, particularly when trying to use fine motor skills. I have asked him to check his blood sugar during these episodes to prove that its not hypoglycemia, he also would like to try a trial off of Neurontin, I think this is acceptable though I think his tremor would likely worsen off of Neurontin. We will print out some information for him regarding benign essential tremors, if we do need to treat it we would likely need to use primidone.  Excessive daytime sleepiness Has still not been contacted regarding his sleep study.  Lumbar spondylosis Axial back pain, known multilevel lumbar DDD, facet arthritis, runs down right leg. Adding x-rays, he does endorse using a complementary and alternative medicine method called Reiki and this seemed to work well so I am happy for him to try this again, if it does not work we can switch back to modern medicine and get an MRI and consider epidurals.  Pneumonia due to COVID-19 virus At the end of the visit reported some breathlessness when performing work around the house, this was a door handle complaint, so we will discuss this at a follow-up visit.    ___________________________________________ Gwen Her. Dianah Field, M.D., ABFM., CAQSM. Primary Care and [place] Instructor of [employer] of [employer]

## 2021-07-12 DIAGNOSIS — N200 Calculus of kidney: Secondary | ICD-10-CM | POA: Diagnosis not present

## 2021-07-12 DIAGNOSIS — K5900 Constipation, unspecified: Secondary | ICD-10-CM | POA: Diagnosis not present

## 2021-07-26 ENCOUNTER — Other Ambulatory Visit: Payer: Self-pay | Admitting: Sports Medicine

## 2021-07-26 DIAGNOSIS — E119 Type 2 diabetes mellitus without complications: Secondary | ICD-10-CM

## 2021-07-27 ENCOUNTER — Telehealth: Payer: Self-pay | Admitting: *Deleted

## 2021-07-27 NOTE — Chronic Care Management (AMB) (Signed)
Chronic Care Management   Note  07/27/2021 Name: Robert Hines MRN: [id number] DOB: 02-18-1956  Robert Hines is a 66 y.o. year old male who is a primary care patient of Dianah Field, Gwen Her, MD. I reached out to Cherrie Gauze by phone today in response to a referral sent by Robert Hines's PCP.  Robert Hines was given information about Chronic Care Management services today including:  CCM service includes personalized support from designated clinical staff supervised by his physician, including individualized plan of care and coordination with other care providers 24/7 contact phone numbers for assistance for urgent and routine care needs. Service will only be billed when office clinical staff spend 20 minutes or more in a month to coordinate care. Only one practitioner may furnish and bill the service in a calendar month. The patient may stop CCM services at any time (effective at the end of the month) by phone call to the office staff. The patient is responsible for co-pay (up to 20% after annual deductible is met) if co-pay is required by the individual health plan.   Patient agreed to services and verbal consent obtained.   Follow up plan: Telephone appointment with care management team member scheduled for: 08/11/2021  Julian Hy, [place] Management  Direct Dial: [phone]

## 2021-08-03 DIAGNOSIS — N200 Calculus of kidney: Secondary | ICD-10-CM | POA: Diagnosis not present

## 2021-08-03 DIAGNOSIS — K5900 Constipation, unspecified: Secondary | ICD-10-CM | POA: Diagnosis not present

## 2021-08-03 DIAGNOSIS — N281 Cyst of kidney, acquired: Secondary | ICD-10-CM | POA: Diagnosis not present

## 2021-08-03 DIAGNOSIS — K760 Fatty (change of) liver, not elsewhere classified: Secondary | ICD-10-CM | POA: Diagnosis not present

## 2021-08-05 ENCOUNTER — Ambulatory Visit (INDEPENDENT_AMBULATORY_CARE_PROVIDER_SITE_OTHER): Payer: Medicare HMO | Admitting: Podiatry

## 2021-08-05 ENCOUNTER — Other Ambulatory Visit: Payer: Self-pay

## 2021-08-05 ENCOUNTER — Encounter: Payer: Self-pay | Admitting: Podiatry

## 2021-08-05 DIAGNOSIS — Z794 Long term (current) use of insulin: Secondary | ICD-10-CM

## 2021-08-05 DIAGNOSIS — M79674 Pain in right toe(s): Secondary | ICD-10-CM

## 2021-08-05 DIAGNOSIS — M79675 Pain in left toe(s): Secondary | ICD-10-CM | POA: Diagnosis not present

## 2021-08-05 DIAGNOSIS — E119 Type 2 diabetes mellitus without complications: Secondary | ICD-10-CM | POA: Diagnosis not present

## 2021-08-05 DIAGNOSIS — B351 Tinea unguium: Secondary | ICD-10-CM | POA: Diagnosis not present

## 2021-08-05 NOTE — Progress Notes (Signed)
°  Subjective:  Patient ID: Robert Hines, male    DOB: [birth year],   MRN: [id number]  Chief Complaint  Patient presents with   Nail Problem     Routine foot care    66 y.o. male presents concern of thickened elongated and painful nails that are difficult to trim. Requesting to have them trimmed today. Relates occasional burning or tingling in his feet. Patient is diabetic and last A1c was 6.9   Denies any other pedal complaints. Denies n/v/f/c.   PCP Robert Langton MD   Past Medical History:  Diagnosis Date   Arthritis    neck   COVID-19    Depression    Diabetes mellitus without complication (HCC)    type 2   Hyperlipidemia    Hypertension    Pneumonia    covid pneumonia and covid july 2020, d/ced july 16th from [facility] stayed 9 days   Renal disorder    calculi, multiple lithotripsies, stents   Right cataract    Rotator cuff disorder    Spinal pain     Objective:  Physical Exam: Vascular: DP/PT pulses 2/4 bilateral. CFT <3 seconds. Absent hair growth and lower extremity edema with xerosis bilateral.  Skin. No lacerations or abrasions bilateral feet.Nails 1-5 are thickened discolored and elongated with subungual debris.  Musculoskeletal: MMT 5/5 bilateral lower extremities in DF, PF, Inversion and Eversion. Deceased ROM in DF of ankle joint.  Neurological: Sensation intact to light touch.  Protective sensation slightly diminished.   Assessment:   1. Type 2 diabetes mellitus without complication, with long-term current use of insulin (HCC)   2. Onychomycosis   3. Pain due to onychomycosis of toenails of both feet       Plan:  Patient was evaluated and treated and all questions answered. -Discussed and educated patient on diabetic foot care, especially with  regards to the vascular, neurological and musculoskeletal systems.  -Stressed the importance of good glycemic control and the detriment of not  controlling glucose levels in relation to the  foot. -Discussed supportive shoes at all times and checking feet regularly.  -Mechanically debrided all nails 1-5 bilateral using sterile nail nipper and filed with dremel without incident  -Answered all patient questions -Patient to return  in 3 months for at risk foot care -Patient advised to call the office if any problems or questions arise in the meantime.   Return in about 3 months (around 11/02/2021) for rfc.   Louann Sjogren, DPM

## 2021-08-11 ENCOUNTER — Ambulatory Visit: Payer: Medicare HMO

## 2021-08-11 DIAGNOSIS — E119 Type 2 diabetes mellitus without complications: Secondary | ICD-10-CM

## 2021-08-11 DIAGNOSIS — Z794 Long term (current) use of insulin: Secondary | ICD-10-CM

## 2021-08-11 NOTE — Patient Instructions (Signed)
Visit Information ° °Thank you for allowing me to share the care management and care coordination services that are available to you as part of your health plan and services through your primary care provider and medical home. Please reach out to me at [phone] if the care management/care coordination team may be of assistance to you in the future.  ° °Martasia Talamante, RN, MSN, BSN, CCM °Care Management Coordinator °[facility] °[phone]  °

## 2021-08-11 NOTE — Chronic Care Management (AMB) (Signed)
°  Care Management   Follow Up Note   08/11/2021 Name: Robert Hines MRN: [id number] DOB: 05/13/1956   Referred by: Monica Becton, MD Reason for referral : No chief complaint on file.   Successful contact was made with the patient to discuss care management and care coordination services. Patient declines engagement at this time.  RNCM is available to follow up with the patient after provider conversation with the patient regarding recommendation for care management engagement and subsequent re-referral for Care Management Services.  Follow Up Plan:  No follow indicated.  Kathyrn Sheriff, RN, MSN, BSN, CCM Care Management Coordinator [facility] [phone]

## 2021-08-12 ENCOUNTER — Other Ambulatory Visit: Payer: Self-pay | Admitting: Sports Medicine

## 2021-08-12 DIAGNOSIS — Z794 Long term (current) use of insulin: Secondary | ICD-10-CM

## 2021-08-12 DIAGNOSIS — F32A Depression, unspecified: Secondary | ICD-10-CM

## 2021-08-12 DIAGNOSIS — E119 Type 2 diabetes mellitus without complications: Secondary | ICD-10-CM

## 2021-08-18 HISTORY — PX: CYSTOSCOPY/RETROGRADE/URETEROSCOPY/STONE EXTRACTION WITH BASKET: SHX5317

## 2021-08-31 DIAGNOSIS — E669 Obesity, unspecified: Secondary | ICD-10-CM | POA: Diagnosis not present

## 2021-08-31 DIAGNOSIS — N200 Calculus of kidney: Secondary | ICD-10-CM | POA: Diagnosis not present

## 2021-08-31 DIAGNOSIS — R109 Unspecified abdominal pain: Secondary | ICD-10-CM | POA: Diagnosis not present

## 2021-08-31 DIAGNOSIS — G4730 Sleep apnea, unspecified: Secondary | ICD-10-CM | POA: Diagnosis not present

## 2021-08-31 DIAGNOSIS — E119 Type 2 diabetes mellitus without complications: Secondary | ICD-10-CM | POA: Diagnosis not present

## 2021-08-31 DIAGNOSIS — K219 Gastro-esophageal reflux disease without esophagitis: Secondary | ICD-10-CM | POA: Diagnosis not present

## 2021-08-31 DIAGNOSIS — Z981 Arthrodesis status: Secondary | ICD-10-CM | POA: Diagnosis not present

## 2021-08-31 DIAGNOSIS — I10 Essential (primary) hypertension: Secondary | ICD-10-CM | POA: Diagnosis not present

## 2021-08-31 DIAGNOSIS — Z87891 Personal history of nicotine dependence: Secondary | ICD-10-CM | POA: Diagnosis not present

## 2021-08-31 DIAGNOSIS — Z6832 Body mass index (BMI) 32.0-32.9, adult: Secondary | ICD-10-CM | POA: Diagnosis not present

## 2021-08-31 DIAGNOSIS — E785 Hyperlipidemia, unspecified: Secondary | ICD-10-CM | POA: Diagnosis not present

## 2021-08-31 DIAGNOSIS — Z794 Long term (current) use of insulin: Secondary | ICD-10-CM | POA: Diagnosis not present

## 2021-09-13 ENCOUNTER — Other Ambulatory Visit: Payer: Self-pay | Admitting: Sports Medicine

## 2021-09-13 DIAGNOSIS — Z87442 Personal history of urinary calculi: Secondary | ICD-10-CM | POA: Diagnosis not present

## 2021-09-13 DIAGNOSIS — Z466 Encounter for fitting and adjustment of urinary device: Secondary | ICD-10-CM | POA: Diagnosis not present

## 2021-09-13 DIAGNOSIS — N200 Calculus of kidney: Secondary | ICD-10-CM | POA: Diagnosis not present

## 2021-09-23 LAB — HM DIABETES EYE EXAM

## 2021-09-24 ENCOUNTER — Other Ambulatory Visit: Payer: Self-pay | Admitting: Sports Medicine

## 2021-09-30 ENCOUNTER — Other Ambulatory Visit: Payer: Self-pay | Admitting: Sports Medicine

## 2021-09-30 ENCOUNTER — Ambulatory Visit (INDEPENDENT_AMBULATORY_CARE_PROVIDER_SITE_OTHER): Payer: Medicare HMO | Admitting: Sports Medicine

## 2021-09-30 DIAGNOSIS — M47816 Spondylosis without myelopathy or radiculopathy, lumbar region: Secondary | ICD-10-CM | POA: Diagnosis not present

## 2021-09-30 DIAGNOSIS — M1612 Unilateral primary osteoarthritis, left hip: Secondary | ICD-10-CM | POA: Diagnosis not present

## 2021-09-30 DIAGNOSIS — Z0189 Encounter for other specified special examinations: Secondary | ICD-10-CM

## 2021-09-30 MED ORDER — TRAMADOL HCL 50 MG PO TABS: 50.0000 mg | ORAL_TABLET | Freq: Three times a day (TID) | ORAL | 3 refills | Status: DC | PRN

## 2021-09-30 NOTE — Progress Notes (Signed)
? ? ?  Procedures performed today:   ? ?None. ? ?Independent interpretation of notes and tests performed by another provider:  ? ?None. ? ?Brief History, Exam, Impression, and Recommendations:   ? ?Lumbar spondylosis ?Pleasant 66 year old male, he has chronic axial low back pain, left-sided, often worse with walking but also with forward flexion, nothing radicular today. ?He has had greater than 6 weeks of conservative treatment so far including NSAIDs, tramadol. ?He has also had home conditioning without much improvement. ?The hip joint injection did not provide much improvement. ?For this reason we will proceed with MRI for epidural planning, I do think he has some spinal stenosis. ?Refilling tramadol for use up to 3 times daily. ? ? ? ?___________________________________________ ?Ihor Austin. Benjamin Stain, M.D., ABFM., CAQSM. ?Primary Care and Sports Medicine ?[facility] ? ?Adjunct Instructor of Family Medicine  ?[employer] ?

## 2021-09-30 NOTE — Assessment & Plan Note (Signed)
Pleasant 66 year old male, he has chronic axial low back pain, left-sided, often worse with walking but also with forward flexion, nothing radicular today. ?He has had greater than 6 weeks of conservative treatment so far including NSAIDs, tramadol. ?He has also had home conditioning without much improvement. ?The hip joint injection did not provide much improvement. ?For this reason we will proceed with MRI for epidural planning, I do think he has some spinal stenosis. ?Refilling tramadol for use up to 3 times daily. ?

## 2021-10-03 ENCOUNTER — Ambulatory Visit (INDEPENDENT_AMBULATORY_CARE_PROVIDER_SITE_OTHER): Payer: Medicare HMO

## 2021-10-03 DIAGNOSIS — W311XXA Contact with metalworking machines, initial encounter: Secondary | ICD-10-CM

## 2021-10-03 DIAGNOSIS — M5126 Other intervertebral disc displacement, lumbar region: Secondary | ICD-10-CM | POA: Diagnosis not present

## 2021-10-03 DIAGNOSIS — M25559 Pain in unspecified hip: Secondary | ICD-10-CM | POA: Diagnosis not present

## 2021-10-03 DIAGNOSIS — Z0189 Encounter for other specified special examinations: Secondary | ICD-10-CM

## 2021-10-03 DIAGNOSIS — Z01818 Encounter for other preprocedural examination: Secondary | ICD-10-CM | POA: Diagnosis not present

## 2021-10-03 DIAGNOSIS — M4126 Other idiopathic scoliosis, lumbar region: Secondary | ICD-10-CM | POA: Diagnosis not present

## 2021-10-03 DIAGNOSIS — M545 Low back pain, unspecified: Secondary | ICD-10-CM

## 2021-10-03 DIAGNOSIS — M47816 Spondylosis without myelopathy or radiculopathy, lumbar region: Secondary | ICD-10-CM

## 2021-10-04 ENCOUNTER — Other Ambulatory Visit: Payer: Self-pay | Admitting: Sports Medicine

## 2021-10-07 ENCOUNTER — Telehealth: Payer: Self-pay

## 2021-10-07 DIAGNOSIS — M47816 Spondylosis without myelopathy or radiculopathy, lumbar region: Secondary | ICD-10-CM

## 2021-10-07 NOTE — Telephone Encounter (Signed)
Patient does want to proceed with epidural. Please place the order.  ?

## 2021-10-07 NOTE — Telephone Encounter (Signed)
Left L5-S1 interlaminar epidural ordered. ?

## 2021-10-14 ENCOUNTER — Other Ambulatory Visit: Payer: Self-pay | Admitting: Sports Medicine

## 2021-10-14 DIAGNOSIS — E119 Type 2 diabetes mellitus without complications: Secondary | ICD-10-CM

## 2021-10-25 ENCOUNTER — Ambulatory Visit
Admission: RE | Admit: 2021-10-25 | Discharge: 2021-10-25 | Disposition: A | Discharge status: Home / Self Care | Payer: Medicare HMO | Source: Ambulatory Visit | Attending: Sports Medicine | Admitting: Sports Medicine

## 2021-10-25 DIAGNOSIS — M5126 Other intervertebral disc displacement, lumbar region: Secondary | ICD-10-CM | POA: Diagnosis not present

## 2021-10-25 DIAGNOSIS — M47816 Spondylosis without myelopathy or radiculopathy, lumbar region: Secondary | ICD-10-CM

## 2021-10-25 MED ORDER — IOPAMIDOL (ISOVUE-M 200) INJECTION 41%
1.0000 mL | Freq: Once | INTRAMUSCULAR | Status: AC
  Administered 2021-10-25: 1 mL via EPIDURAL

## 2021-10-25 MED ORDER — METHYLPREDNISOLONE ACETATE 40 MG/ML INJ SUSP (RADIOLOG
80.0000 mg | Freq: Once | INTRAMUSCULAR | Status: AC
  Administered 2021-10-25: 80 mg via EPIDURAL

## 2021-10-25 NOTE — Discharge Instructions (Signed)

## 2021-10-28 ENCOUNTER — Ambulatory Visit: Payer: Medicare HMO | Admitting: Podiatry

## 2021-10-29 ENCOUNTER — Encounter: Payer: Self-pay | Admitting: Sports Medicine

## 2021-11-01 ENCOUNTER — Ambulatory Visit (INDEPENDENT_AMBULATORY_CARE_PROVIDER_SITE_OTHER): Payer: Medicare HMO | Admitting: Sports Medicine

## 2021-11-01 DIAGNOSIS — Z0000 Encounter for general adult medical examination without abnormal findings: Secondary | ICD-10-CM

## 2021-11-01 DIAGNOSIS — N200 Calculus of kidney: Secondary | ICD-10-CM | POA: Diagnosis not present

## 2021-11-01 NOTE — Progress Notes (Signed)
? ? ?MEDICARE ANNUAL WELLNESS VISIT ? ?11/01/2021 ? ?Telephone Visit Disclaimer ?This Medicare AWV was conducted by telephone due to national recommendations for restrictions regarding the COVID-19 Pandemic (e.g. social distancing).  I verified, using two identifiers, that I am speaking with Robert Hines or their authorized healthcare agent. I discussed the limitations, risks, security, and privacy concerns of performing an evaluation and management service by telephone and the potential availability of an in-person appointment in the future. The patient expressed understanding and agreed to proceed.  ?Location of Patient: Home ?Location of Provider (nurse):  In the office. ? ?Subjective:  ? ? ?Robert Hines is a 66 y.o. male patient of Thekkekandam, Ihor Austin, MD who had a Medicare Annual Wellness Visit today via telephone. Robert Hines is Retired and lives with their spouse and one child. he has 2 children. he reports that he is socially active and does interact with friends/family regularly. he is minimally physically active and enjoys gardening and fishing. ? ?Patient Care Team: ?Monica Becton, MD as PCP - General (Family Medicine) ? ? ?  11/01/2021  ?  3:11 PM 08/14/2020  ? 11:26 AM 04/04/2019  ? 11:04 AM 03/21/2019  ?  8:04 AM 08/25/2017  ?  1:52 PM 01/12/2016  ? 11:13 AM 12/31/2015  ?  4:27 PM  ?Advanced Directives  ?Does Patient Have a Medical Advance Directive? No No No No No No No  ?Would patient like information on creating a medical advance directive? No - Patient declined No - Patient declined No - Patient declined No - Patient declined No - Patient declined No - patient declined information   ? ? ?Hospital Utilization Over the Past 12 Months: ?# of hospitalizations or ER visits: 0 ?# of surgeries: 1 ? ?Review of Systems    ?Patient reports that his overall health is unchanged compared to last year. ? ?History obtained from chart review and the patient ? ?Patient Reported Readings (BP, Pulse, CBG,  Weight, etc) ?none ? ?Pain Assessment ?Pain : No/denies pain ? ?  ? ?Current Medications & Allergies (verified) ?Allergies as of 11/01/2021   ? ?   Reactions  ? Augmentin [amoxicillin-pot Clavulanate] Diarrhea  ? Codeine Diarrhea, Nausea Only  ? Duloxetine Diarrhea, Nausea Only  ? cymbalta is rash  ? ?  ? ?  ?Medication List  ?  ? ?  ? Accurate as of Nov 01, 2021  3:27 PM. If you have any questions, ask your nurse or doctor.  ?  ?  ? ?  ? ?aspirin 81 MG chewable tablet ?Chew by mouth daily. ?  ?Basaglar KwikPen 100 UNIT/ML ?INJECT 94 UNITS UNDER THE SKIN EVERY NIGHT AT BEDTIME ?  ?buPROPion 300 MG 24 hr tablet ?Commonly known as: WELLBUTRIN XL ?TAKE ONE TABLET BY MOUTH DAILY ?  ?gabapentin 300 MG capsule ?Commonly known as: NEURONTIN ?TAKE 3 CAPSULES IN THE     MORNING AND 4 CAPSULES IN  THE EVENING ?  ?glipiZIDE 5 MG tablet ?Commonly known as: GLUCOTROL ?Take 1 tablet (5 mg total) by mouth daily before breakfast. ?  ?Insulin Pen Needle 32G X 4 MM Misc ?Use as needed with toujeo pen ?  ?ketorolac 10 MG tablet ?Commonly known as: TORADOL ?Take 1 tablet (10 mg total) by mouth every 8 (eight) hours as needed. ?  ?lisinopril 20 MG tablet ?Commonly known as: ZESTRIL ?Take 1 tablet (20 mg total) by mouth daily. ?  ?metFORMIN 1000 MG tablet ?Commonly known as: GLUCOPHAGE ?Take 1 tablet (1,000 mg  total) by mouth 2 (two) times daily with a meal. ?  ?MULTIVITAMIN ADULT PO ?Take by mouth. ?  ?PROBIOTIC-10 PO ?Take by mouth. ?  ?simvastatin 40 MG tablet ?Commonly known as: ZOCOR ?TAKE ONE TABLET BY MOUTH DAILY ?  ?tamsulosin 0.4 MG Caps capsule ?Commonly known as: FLOMAX ?Take 0.4 mg by mouth daily. ?  ?traMADol 50 MG tablet ?Commonly known as: ULTRAM ?Take 1 tablet (50 mg total) by mouth every 8 (eight) hours as needed for moderate pain. ?  ?Trulicity 1.5 MG/0.5ML Sopn ?Generic drug: Dulaglutide ?INJECT 1.5 MG UNDER THE SKIN ONCE WEEKLY ON WEDNESDAY IN THE EVENING. ?  ?Xigduo XR 03-999 MG Tb24 ?Generic drug:  Dapagliflozin-metFORMIN HCl ER ?Take 1 tablet by mouth daily. ?  ?zolpidem 10 MG tablet ?Commonly known as: AMBIEN ?TAKE ONE TABLET BY MOUTH EVERY NIGHT AT BEDTIME AS NEEDED FOR SLEEP ?  ? ?  ? ? ?History (reviewed): ?Past Medical History:  ?Diagnosis Date  ? Arthritis   ? neck  ? COVID-19   ? Depression   ? Diabetes mellitus without complication (HCC)   ? type 2  ? Hyperlipidemia   ? Hypertension   ? Pneumonia   ? covid pneumonia and covid july 2020, d/ced july 16th from [facility] stayed 9 days  ? Renal disorder   ? calculi, multiple lithotripsies, stents  ? Right cataract   ? Rotator cuff disorder   ? Spinal pain   ? ?Past Surgical History:  ?Procedure Laterality Date  ? ANTERIOR FUSION CERVICAL SPINE  2007  ? ARTHOSCOPIC ROTAOR CUFF REPAIR Left 03/21/2019  ? Procedure: ARTHROSCOPIC ROTATOR CUFF REPAIR;  Surgeon: Jones Broom, MD;  Location: [facility];  Service: Orthopedics;  Laterality: Left;  ? CYSTOSCOPY/RETROGRADE/URETEROSCOPY/STONE EXTRACTION WITH BASKET  08/2021  ? EYE SURGERY Left   ? cataract  ? FOOT SURGERY Right   ? pin placed and removed  ? HERNIA REPAIR    ? inguinal  ? LITHOTRIPSY    ? several  ? SHOULDER ARTHROSCOPY WITH ROTATOR CUFF REPAIR AND SUBACROMIAL DECOMPRESSION Right 12/28/2015  ? Procedure: SHOULDER ARTHROSCOPY TAKE DOWN ROTATOR CUFF REPAIR AND SUBACROMIAL DECOMPRESSION, DEBRIDEMENT OF SLAP TEAR;  Surgeon: Jones Broom, MD;  Location: [facility];  Service: Orthopedics;  Laterality: Right;  SHOULDER ARTHROSCOPY TAKE DOWN ROTATOR CUFF REPAIR AND SUBACROMIAL DECOMPRESSION  ? SHOULDER ARTHROSCOPY WITH SUBACROMIAL DECOMPRESSION Left 03/21/2019  ? Procedure: SHOULDER ARTHROSCOPY WITH SUBACROMIAL DECOMPRESSION;  Surgeon: Jones Broom, MD;  Location: [facility];  Service: Orthopedics;  Laterality: Left;  ? VASECTOMY Bilateral   ? ?Family History  ?Problem Relation Age of Onset  ? Hypertension Mother   ? Heart failure Father   ?  Diabetes Father   ? Hypertension Father   ? Heart failure Sister   ? Hypertension Sister   ? ?Social History  ? ?Socioeconomic History  ? Marital status: Married  ?  Spouse name: Meriam Sprague Dewayne Hatch)  ? Number of children: 2  ? Years of education: 12th grade  ? Highest education level: High school graduate  ?Occupational History  ? Occupation: Retired  ?Tobacco Use  ? Smoking status: Former  ?  Packs/day: 1.00  ?  Years: 15.00  ?  Pack years: 15.00  ?  Types: Cigarettes  ?  Quit date: 10/01/2018  ?  Years since quitting: 3.0  ? Smokeless tobacco: Never  ?Vaping Use  ? Vaping Use: Never used  ?Substance and Sexual Activity  ? Alcohol use: Yes  ?  Alcohol/week:  0.0 standard drinks  ?  Comment: social  ? Drug use: No  ? Sexual activity: Yes  ?  Birth control/protection: None  ?Other Topics Concern  ? Not on file  ?Social History Narrative  ? Lives with his wife. He has recently retired and is working on finding new hobbies. He loves to do things outdoors such as gardening or fishing.   ? ?Social Determinants of Health  ? ?Financial Resource Strain: Low Risk   ? Difficulty of Paying Living Expenses: Not hard at all  ?Food Insecurity: No Food Insecurity  ? Worried About Programme researcher, broadcasting/film/videounning Out of Food in the Last Year: Never true  ? Ran Out of Food in the Last Year: Never true  ?Transportation Needs: No Transportation Needs  ? Lack of Transportation (Medical): No  ? Lack of Transportation (Non-Medical): No  ?Physical Activity: Inactive  ? Days of Exercise per Week: 0 days  ? Minutes of Exercise per Session: 0 min  ?Stress: No Stress Concern Present  ? Feeling of Stress : Not at all  ?Social Connections: Moderately Isolated  ? Frequency of Communication with Friends and Family: More than three times a week  ? Frequency of Social Gatherings with Friends and Family: Twice a week  ? Attends Religious Services: Never  ? Active Member of Clubs or Organizations: No  ? Attends BankerClub or Organization Meetings: Never  ? Marital Status: Married   ? ? ?Activities of Daily Living ? ?  11/01/2021  ?  3:12 PM  ?In your present state of health, do you have any difficulty performing the following activities:  ?Hearing? 0  ?Comment wears bilateral hearing aids.  ?Vision? 0  ?Difficulty

## 2021-11-01 NOTE — Patient Instructions (Addendum)
?MEDICARE ANNUAL WELLNESS VISIT ?Health Maintenance Summary and Written Plan of Care ? ?Robert Hines , ? ?Thank you for allowing me to perform your Medicare Annual Wellness Visit and for your ongoing commitment to your health.  ? ?Health Maintenance & Immunization History ?Health Maintenance  ?Topic Date Due  ?? FOOT EXAM  11/02/2021 (Originally 08/07/2021)  ?? HEMOGLOBIN A1C  11/02/2021 (Originally 10/26/2021)  ?? COVID-19 Vaccine (3 - Booster for Pfizer series) 11/17/2021 (Originally 10/23/2019)  ?? INFLUENZA VACCINE  01/18/2022  ?? OPHTHALMOLOGY EXAM  09/24/2022  ?? COLONOSCOPY (Pts [id number] Insurance coverage will need to be confirmed)  03/08/2025  ?? TETANUS/TDAP  05/24/2028  ?? Pneumonia Vaccine 1465+ Years old  Completed  ?? Hepatitis C Screening  Completed  ?? Zoster Vaccines- Shingrix  Completed  ?? HPV VACCINES  Aged Out  ? ?Immunization History  ?Administered Date(s) Administered  ?? Fluad Quad(high Dose 65+) 04/02/2019  ?? Influenza-Unspecified 04/03/2014, 03/31/2015, 03/28/2016, 03/12/2020, 04/26/2021  ?? PFIZER(Purple Top)SARS-COV-2 Vaccination 08/02/2019, 08/28/2019  ?? Pneumococcal Conjugate-13 08/07/2020  ?? Pneumococcal Polysaccharide-23 01/23/2015, 04/02/2019  ?? Td 05/21/2008  ?? Tdap 05/24/2018  ?? Zoster Recombinat (Shingrix) 11/11/2020, 12/30/2020  ?? Zoster, Live 01/23/2015  ? ? ?These are the patient goals that we discussed: ? Goals Addressed   ?  ?  ?  ?  ?  ? This Visit's Progress  ? ?  Patient Stated (pt-stated)     ?   11/01/2021 ?AWV Goal: Diabetes Management ? ?Patient will maintain an A1C level below 7.0 ?Patient will not develop any diabetic foot complications ?Patient will not experience any hypoglycemic episodes over the next 3 months ?Patient will notify our office of any CBG readings outside of the provider recommended range by calling [phone] ?Patient will adhere to provider recommendations for diabetes management ? ?Patient Self Management Activities ?take all medications as  prescribed and report any negative side effects ?monitor and record blood sugar readings as directed ?adhere to a low carbohydrate diet that incorporates lean proteins, vegetables, whole grains, low glycemic fruits ?check feet daily noting any sores, cracks, injuries, or callous formations ?see PCP or podiatrist if he notices any changes in his legs, feet, or toenails ?Patient will visit PCP and have an A1C level checked every 3 to 6 months as directed  ?have a yearly eye exam to monitor for vascular changes associated with diabetes and will request that the report be sent to his pcp.  ?consult with his PCP regarding any changes in his health or new or worsening symptoms  ?  ?  ?  ? ?This is a list of Health Maintenance Items that are overdue or due now: ?Foot exam ?Hemoglobin A1C ? ?Orders/Referrals Placed Today: ?No orders of the defined types were placed in this encounter. ? ?(Contact our referral department at [phone] if you have not spoken with someone about your referral appointment within the next 5 days)  ? ? ?Follow-up Plan ?Follow-up with Monica Bectonhekkekandam, Thomas J, MD as planned ?Schedule your hemoglobin A1c and your foot exam. ?Medicare wellness visit in one year.  ?Patient will access AVS on my chart. ? ? ? ?  ?Health Maintenance, Male ?Adopting a healthy lifestyle and getting preventive care are important in promoting health and wellness. Ask your health care provider about: ?The right schedule for you to have regular tests and exams. ?Things you can do on your own to prevent diseases and keep yourself healthy. ?What should I know about diet, weight, and exercise? ?Eat a healthy diet ? ?Eat a  diet that includes plenty of vegetables, fruits, low-fat dairy products, and lean protein. ?Do not eat a lot of foods that are high in solid fats, added sugars, or sodium. ?Maintain a healthy weight ?Body mass index (BMI) is a measurement that can be used to identify possible weight problems. It estimates body  fat based on height and weight. Your health care provider can help determine your BMI and help you achieve or maintain a healthy weight. ?Get regular exercise ?Get regular exercise. This is one of the most important things you can do for your health. Most adults should: ?Exercise for at least 150 minutes each week. The exercise should increase your heart rate and make you sweat (moderate-intensity exercise). ?Do strengthening exercises at least twice a week. This is in addition to the moderate-intensity exercise. ?Spend less time sitting. Even light physical activity can be beneficial. ?Watch cholesterol and blood lipids ?Have your blood tested for lipids and cholesterol at 68 years of age, then have this test every 5 years. ?You may need to have your cholesterol levels checked more often if: ?Your lipid or cholesterol levels are high. ?You are older than 68 years of age. ?You are at high risk for heart disease. ?What should I know about cancer screening? ?Many types of cancers can be detected early and may often be prevented. Depending on your health history and family history, you may need to have cancer screening at various ages. This may include screening for: ?Colorectal cancer. ?Prostate cancer. ?Skin cancer. ?Lung cancer. ?What should I know about heart disease, diabetes, and high blood pressure? ?Blood pressure and heart disease ?High blood pressure causes heart disease and increases the risk of stroke. This is more likely to develop in people who have high blood pressure readings or are overweight. ?Talk with your health care provider about your target blood pressure readings. ?Have your blood pressure checked: ?Every 3-5 years if you are 46-38 years of age. ?Every year if you are 4 years old or older. ?If you are between the ages of 43 and 66 and are a current or former smoker, ask your health care provider if you should have a one-time screening for abdominal aortic aneurysm (AAA). ?Diabetes ?Have  regular diabetes screenings. This checks your fasting blood sugar level. Have the screening done: ?Once every three years after age 53 if you are at a normal weight and have a low risk for diabetes. ?More often and at a younger age if you are overweight or have a high risk for diabetes. ?What should I know about preventing infection? ?Hepatitis B ?If you have a higher risk for hepatitis B, you should be screened for this virus. Talk with your health care provider to find out if you are at risk for hepatitis B infection. ?Hepatitis C ?Blood testing is recommended for: ?Everyone born from [birth year] through 1967. ?Anyone with known risk factors for hepatitis C. ?Sexually transmitted infections (STIs) ?You should be screened each year for STIs, including gonorrhea and chlamydia, if: ?You are sexually active and are younger than 68 years of age. ?You are older than 68 years of age and your health care provider tells you that you are at risk for this type of infection. ?Your sexual activity has changed since you were last screened, and you are at increased risk for chlamydia or gonorrhea. Ask your health care provider if you are at risk. ?Ask your health care provider about whether you are at high risk for HIV. Your health care provider may  recommend a prescription medicine to help prevent HIV infection. If you choose to take medicine to prevent HIV, you should first get tested for HIV. You should then be tested every 3 months for as long as you are taking the medicine. ?Follow these instructions at home: ?Alcohol use ?Do not drink alcohol if your health care provider tells you not to drink. ?If you drink alcohol: ?Limit how much you have to 0-2 drinks a day. ?Know how much alcohol is in your drink. In the [place] one drink equals one 12 oz bottle of beer (355 mL), one 5 oz glass of wine (148 mL), or one 1? oz glass of hard liquor (44 mL). ?Lifestyle ?Do not use any products that contain nicotine or tobacco. These products  include cigarettes, chewing tobacco, and vaping devices, such as e-cigarettes. If you need help quitting, ask your health care provider. ?Do not use street drugs. ?Do not share needles. ?Ask your health care provider

## 2021-11-04 ENCOUNTER — Encounter: Payer: Self-pay | Admitting: Podiatry

## 2021-11-04 ENCOUNTER — Ambulatory Visit: Payer: Medicare HMO | Admitting: Podiatry

## 2021-11-04 DIAGNOSIS — M79674 Pain in right toe(s): Secondary | ICD-10-CM | POA: Diagnosis not present

## 2021-11-04 DIAGNOSIS — E119 Type 2 diabetes mellitus without complications: Secondary | ICD-10-CM

## 2021-11-04 DIAGNOSIS — M79675 Pain in left toe(s): Secondary | ICD-10-CM | POA: Diagnosis not present

## 2021-11-04 DIAGNOSIS — B351 Tinea unguium: Secondary | ICD-10-CM

## 2021-11-04 DIAGNOSIS — Z794 Long term (current) use of insulin: Secondary | ICD-10-CM | POA: Diagnosis not present

## 2021-11-04 NOTE — Progress Notes (Signed)
  Subjective:  Patient ID: Robert Hines, male    DOB: 1955-10-25,   MRN: [id number]  Chief Complaint  Patient presents with   Nail Problem     Routine foot care    66 y.o. male presents concern of thickened elongated and painful nails that are difficult to trim. Requesting to have them trimmed today. Relates occasional burning or tingling in his feet. Patient is diabetic and last A1c was 6.9   Denies any other pedal complaints. Denies n/v/f/c.   PCP Aundria Mems MD   Past Medical History:  Diagnosis Date   Arthritis    neck   COVID-19    Depression    Diabetes mellitus without complication ([place])    type 2   Hyperlipidemia    Hypertension    Pneumonia    covid pneumonia and covid july 2020, d/ced july 16th from [facility] stayed 9 days   Renal disorder    calculi, multiple lithotripsies, stents   Right cataract    Rotator cuff disorder    Spinal pain     Objective:  Physical Exam: Vascular: DP/PT pulses 2/4 bilateral. CFT <3 seconds. Absent hair growth and lower extremity edema with xerosis bilateral.  Skin. No lacerations or abrasions bilateral feet.Nails 1-5 are thickened discolored and elongated with subungual debris.  Musculoskeletal: MMT 5/5 bilateral lower extremities in DF, PF, Inversion and Eversion. Deceased ROM in DF of ankle joint.  Neurological: Sensation intact to light touch.  Protective sensation slightly diminished.   Assessment:   1. Type 2 diabetes mellitus without complication, with long-term current use of insulin ([place])   2. Onychomycosis   3. Pain due to onychomycosis of toenails of both feet       Plan:  Patient was evaluated and treated and all questions answered. -Discussed and educated patient on diabetic foot care, especially with  regards to the vascular, neurological and musculoskeletal systems.  -Stressed the importance of good glycemic control and the detriment of not  controlling glucose levels in relation to the  foot. -Discussed supportive shoes at all times and checking feet regularly.  -Mechanically debrided all nails 1-5 bilateral using sterile nail nipper and filed with dremel without incident  -Answered all patient questions -Patient to return  in 3 months for at risk foot care -Patient advised to call the office if any problems or questions arise in the meantime.   No follow-ups on file.   Lorenda Peck, DPM

## 2021-11-08 ENCOUNTER — Other Ambulatory Visit: Payer: Self-pay | Admitting: Sports Medicine

## 2021-11-08 DIAGNOSIS — I10 Essential (primary) hypertension: Secondary | ICD-10-CM

## 2021-11-22 ENCOUNTER — Ambulatory Visit (INDEPENDENT_AMBULATORY_CARE_PROVIDER_SITE_OTHER): Payer: Medicare HMO | Admitting: Sports Medicine

## 2021-11-22 DIAGNOSIS — G3184 Mild cognitive impairment, so stated: Secondary | ICD-10-CM | POA: Insufficient documentation

## 2021-11-22 DIAGNOSIS — M47816 Spondylosis without myelopathy or radiculopathy, lumbar region: Secondary | ICD-10-CM | POA: Diagnosis not present

## 2021-11-22 NOTE — Assessment & Plan Note (Addendum)
Clemente returns, he is a pleasant 65 year old male, chronic axial low back pain, left-sided, usually worse with walking but also forward flexion, MRI did show some mild degenerative disc disease, we did proceed with a epidural, he had good relief for several days with a recurrence of pain. Tramadol is insufficient, gabapentin does ease the pain, but higher doses than he is on will cause excessive sedation. He understands that we typically need to stack these as a series of 3, if this fails we will likely give an opinion from neurosurgery and refer him to pain management for discussion of chronic hydrocodone treatment.

## 2021-11-22 NOTE — Progress Notes (Signed)
    Procedures performed today:    None.  Independent interpretation of notes and tests performed by another provider:   None.  Brief History, Exam, Impression, and Recommendations:    Lumbar spondylosis Farouk returns, he is a pleasant 66 year old male, chronic axial low back pain, left-sided, usually worse with walking but also forward flexion, MRI did show some mild degenerative disc disease, we did proceed with a epidural, he had good relief for several days with a recurrence of pain. Tramadol is insufficient, gabapentin does ease the pain, but higher doses than he is on will cause excessive sedation. He understands that we typically need to stack these as a series of 3, if this fails we will likely give an opinion from neurosurgery and refer him to pain management for discussion of chronic hydrocodone treatment.  Mild cognitive impairment Mild forgetfulness after the epidural. It did raise his blood sugars into the 300s. I think his forgetfulness is likely secondary, we will keep an eye on this for now. If it does not resolve we will consider dementia work-up.    ___________________________________________ Ihor Austin. Benjamin Stain, M.D., ABFM., CAQSM. Primary Care and Sports Medicine [facility]  Adjunct Instructor of Family Medicine  [employer]

## 2021-11-22 NOTE — Assessment & Plan Note (Signed)
Mild forgetfulness after the epidural. It did raise his blood sugars into the 300s. I think his forgetfulness is likely secondary, we will keep an eye on this for now. If it does not resolve we will consider dementia work-up.

## 2021-11-26 ENCOUNTER — Ambulatory Visit
Admission: RE | Admit: 2021-11-26 | Discharge: 2021-11-26 | Disposition: A | Discharge status: Home / Self Care | Payer: Medicare HMO | Source: Ambulatory Visit | Attending: Sports Medicine | Admitting: Sports Medicine

## 2021-11-26 DIAGNOSIS — M47816 Spondylosis without myelopathy or radiculopathy, lumbar region: Secondary | ICD-10-CM

## 2021-11-26 DIAGNOSIS — M47817 Spondylosis without myelopathy or radiculopathy, lumbosacral region: Secondary | ICD-10-CM | POA: Diagnosis not present

## 2021-11-26 MED ORDER — METHYLPREDNISOLONE ACETATE 40 MG/ML INJ SUSP (RADIOLOG
80.0000 mg | Freq: Once | INTRAMUSCULAR | Status: AC
  Administered 2021-11-26: 80 mg via EPIDURAL

## 2021-11-26 MED ORDER — IOPAMIDOL (ISOVUE-M 200) INJECTION 41%
1.0000 mL | Freq: Once | INTRAMUSCULAR | Status: AC
  Administered 2021-11-26: 1 mL via EPIDURAL

## 2021-11-26 NOTE — Discharge Instructions (Signed)

## 2021-12-14 DIAGNOSIS — H5203 Hypermetropia, bilateral: Secondary | ICD-10-CM | POA: Diagnosis not present

## 2021-12-14 DIAGNOSIS — H40053 Ocular hypertension, bilateral: Secondary | ICD-10-CM | POA: Diagnosis not present

## 2021-12-14 DIAGNOSIS — H52221 Regular astigmatism, right eye: Secondary | ICD-10-CM | POA: Diagnosis not present

## 2021-12-14 DIAGNOSIS — E119 Type 2 diabetes mellitus without complications: Secondary | ICD-10-CM | POA: Diagnosis not present

## 2021-12-14 DIAGNOSIS — H524 Presbyopia: Secondary | ICD-10-CM | POA: Diagnosis not present

## 2021-12-14 DIAGNOSIS — Z961 Presence of intraocular lens: Secondary | ICD-10-CM | POA: Diagnosis not present

## 2021-12-24 DIAGNOSIS — J449 Chronic obstructive pulmonary disease, unspecified: Secondary | ICD-10-CM | POA: Diagnosis not present

## 2021-12-24 DIAGNOSIS — Z79899 Other long term (current) drug therapy: Secondary | ICD-10-CM | POA: Diagnosis not present

## 2021-12-24 DIAGNOSIS — R000 Tachycardia, unspecified: Secondary | ICD-10-CM | POA: Diagnosis not present

## 2021-12-24 DIAGNOSIS — R918 Other nonspecific abnormal finding of lung field: Secondary | ICD-10-CM | POA: Diagnosis not present

## 2021-12-24 DIAGNOSIS — I10 Essential (primary) hypertension: Secondary | ICD-10-CM | POA: Diagnosis not present

## 2021-12-24 DIAGNOSIS — Z8616 Personal history of COVID-19: Secondary | ICD-10-CM | POA: Diagnosis not present

## 2021-12-24 DIAGNOSIS — Z7984 Long term (current) use of oral hypoglycemic drugs: Secondary | ICD-10-CM | POA: Diagnosis not present

## 2021-12-24 DIAGNOSIS — R0789 Other chest pain: Secondary | ICD-10-CM | POA: Diagnosis not present

## 2021-12-24 DIAGNOSIS — R0602 Shortness of breath: Secondary | ICD-10-CM | POA: Diagnosis not present

## 2021-12-24 DIAGNOSIS — Z87891 Personal history of nicotine dependence: Secondary | ICD-10-CM | POA: Diagnosis not present

## 2021-12-24 DIAGNOSIS — E119 Type 2 diabetes mellitus without complications: Secondary | ICD-10-CM | POA: Diagnosis not present

## 2021-12-25 ENCOUNTER — Other Ambulatory Visit: Payer: Self-pay | Admitting: Sports Medicine

## 2022-01-03 DIAGNOSIS — Z87442 Personal history of urinary calculi: Secondary | ICD-10-CM | POA: Diagnosis not present

## 2022-01-03 DIAGNOSIS — N281 Cyst of kidney, acquired: Secondary | ICD-10-CM | POA: Diagnosis not present

## 2022-01-03 DIAGNOSIS — R109 Unspecified abdominal pain: Secondary | ICD-10-CM | POA: Diagnosis not present

## 2022-01-12 ENCOUNTER — Telehealth (INDEPENDENT_AMBULATORY_CARE_PROVIDER_SITE_OTHER): Payer: Medicare HMO | Admitting: Sports Medicine

## 2022-01-12 DIAGNOSIS — U071 COVID-19: Secondary | ICD-10-CM | POA: Diagnosis not present

## 2022-01-12 DIAGNOSIS — J1282 Pneumonia due to coronavirus disease 2019: Secondary | ICD-10-CM | POA: Diagnosis not present

## 2022-01-12 DIAGNOSIS — M1612 Unilateral primary osteoarthritis, left hip: Secondary | ICD-10-CM | POA: Diagnosis not present

## 2022-01-12 DIAGNOSIS — M47816 Spondylosis without myelopathy or radiculopathy, lumbar region: Secondary | ICD-10-CM

## 2022-01-12 MED ORDER — TRAMADOL HCL 50 MG PO TABS: 100.0000 mg | ORAL_TABLET | Freq: Two times a day (BID) | ORAL | 3 refills | Status: DC

## 2022-01-12 NOTE — Assessment & Plan Note (Signed)
Robert Hines is a very pleasant 66 year old male, we have been treating him longitudinally for chronic axial low back pain, left-sided, typically worse with walking, better with rest. MRI showed some mild degenerative disc disease with a good sided left-sided L5-S1 disc retrusion. He has had 2 epidurals, with waning efficacy. Gabapentin does ease the pain, but he is on the maximum tolerable dose right now. We will bump up his tramadol and I would like him to get a second opinion from neurosurgery. He does have a friend who had a suggestion, he will let me know who he would like to use.  In the meantime we will increase his tramadol for use 100 mg twice daily.

## 2022-01-12 NOTE — Progress Notes (Signed)
The injections haven't really helped. Patient would like to know what the next steps are.

## 2022-01-12 NOTE — Progress Notes (Signed)
   Virtual Visit via WebEx/MyChart   I connected with  Robert Hines  on 01/12/22 via WebEx/MyChart/Doximity Video and verified that I am speaking with the correct person using two identifiers.   I discussed the limitations, risks, security and privacy concerns of performing an evaluation and management service by WebEx/MyChart/Doximity Video, including the higher likelihood of inaccurate diagnosis and treatment, and the availability of in person appointments.  We also discussed the likely need of an additional face to face encounter for complete and high quality delivery of care.  I also discussed with the patient that there may be a patient responsible charge related to this service. The patient expressed understanding and wishes to proceed.  Provider location is in medical facility. Patient location is at their home, different from provider location. People involved in care of the patient during this telehealth encounter were myself, my nurse/medical assistant, and my front office/scheduling team member.  Review of Systems: No fevers, chills, night sweats, weight loss, chest pain, or shortness of breath.   Objective Findings:    General: Speaking full sentences, no audible heavy breathing.  Sounds alert and appropriately interactive.  Appears well.  Face symmetric.  Extraocular movements intact.  Pupils equal and round.  No nasal flaring or accessory muscle use visualized.  Independent interpretation of tests performed by another provider:   None.  Brief History, Exam, Impression, and Recommendations:    Lumbar spondylosis Robert Hines is a very pleasant 66 year old male, we have been treating him longitudinally for chronic axial low back pain, left-sided, typically worse with walking, better with rest. MRI showed some mild degenerative disc disease with a good sided left-sided L5-S1 disc retrusion. He has had 2 epidurals, with waning efficacy. Gabapentin does ease the pain, but he is on the  maximum tolerable dose right now. We will bump up his tramadol and I would like him to get a second opinion from neurosurgery. He does have a friend who had a suggestion, he will let me know who he would like to use.  In the meantime we will increase his tramadol for use 100 mg twice daily.   Pneumonia due to COVID-19 virus Robert Hines also has history of COVID-19 with pneumonia, he is having intermittent episodes of shortness of breath, this has happened recently when going into a cold room. Was seen in the emergency department where imaging was unrevealing, he was given some albuterol. I think he likely has some chronic lung scarring, possibly some chronic obstructive lung disease, and likely reactive airways. I would like to bring him back for pre and postbronchodilator spirometry. We we will likely get a high-resolution CT as well.   I discussed the above assessment and treatment plan with the patient. The patient was provided an opportunity to ask questions and all were answered. The patient agreed with the plan and demonstrated an understanding of the instructions.   The patient was advised to call back or seek an in-person evaluation if the symptoms worsen or if the condition fails to improve as anticipated.   I provided 30 minutes of face to face and non-face-to-face time during this encounter date, time was needed to gather information, review chart, records, communicate/coordinate with staff remotely, as well as complete documentation.   ____________________________________________ Ihor Austin. Benjamin Stain, M.D., ABFM., CAQSM., AME. Primary Care and Sports Medicine [facility]  Adjunct Professor of Family Medicine  [employer]  Restaurant manager, fast food

## 2022-01-12 NOTE — Assessment & Plan Note (Signed)
Robert Hines also has history of COVID-19 with pneumonia, he is having intermittent episodes of shortness of breath, this has happened recently when going into a cold room. Was seen in the emergency department where imaging was unrevealing, he was given some albuterol. I think he likely has some chronic lung scarring, possibly some chronic obstructive lung disease, and likely reactive airways. I would like to bring him back for pre and postbronchodilator spirometry. We we will likely get a high-resolution CT as well.

## 2022-01-18 ENCOUNTER — Encounter: Payer: Self-pay | Admitting: Sports Medicine

## 2022-01-18 DIAGNOSIS — M47816 Spondylosis without myelopathy or radiculopathy, lumbar region: Secondary | ICD-10-CM

## 2022-01-21 DIAGNOSIS — M5459 Other low back pain: Secondary | ICD-10-CM | POA: Diagnosis not present

## 2022-01-21 DIAGNOSIS — M47816 Spondylosis without myelopathy or radiculopathy, lumbar region: Secondary | ICD-10-CM | POA: Diagnosis not present

## 2022-02-08 ENCOUNTER — Other Ambulatory Visit: Payer: Self-pay | Admitting: Sports Medicine

## 2022-02-10 ENCOUNTER — Ambulatory Visit: Payer: Medicare HMO | Admitting: Podiatry

## 2022-02-14 DIAGNOSIS — S90112A Contusion of left great toe without damage to nail, initial encounter: Secondary | ICD-10-CM | POA: Diagnosis not present

## 2022-02-14 DIAGNOSIS — M2011 Hallux valgus (acquired), right foot: Secondary | ICD-10-CM | POA: Diagnosis not present

## 2022-02-14 DIAGNOSIS — L84 Corns and callosities: Secondary | ICD-10-CM | POA: Diagnosis not present

## 2022-02-14 DIAGNOSIS — B351 Tinea unguium: Secondary | ICD-10-CM | POA: Diagnosis not present

## 2022-02-18 ENCOUNTER — Telehealth: Payer: Self-pay

## 2022-02-18 DIAGNOSIS — M1612 Unilateral primary osteoarthritis, left hip: Secondary | ICD-10-CM

## 2022-02-18 MED ORDER — NIRMATRELVIR/RITONAVIR (PAXLOVID)TABLET: ORAL_TABLET | ORAL | 0 refills | Status: DC

## 2022-02-18 NOTE — Telephone Encounter (Signed)
Wife called to report that she was COVID positive last week and patient tested positive today. She wants to get the antiviral for patient.

## 2022-02-18 NOTE — Telephone Encounter (Signed)
Paxlovid called in for [place].

## 2022-03-03 DIAGNOSIS — M47816 Spondylosis without myelopathy or radiculopathy, lumbar region: Secondary | ICD-10-CM | POA: Diagnosis not present

## 2022-03-10 DIAGNOSIS — R6889 Other general symptoms and signs: Secondary | ICD-10-CM | POA: Diagnosis not present

## 2022-03-10 DIAGNOSIS — M47816 Spondylosis without myelopathy or radiculopathy, lumbar region: Secondary | ICD-10-CM | POA: Diagnosis not present

## 2022-03-17 DIAGNOSIS — M47816 Spondylosis without myelopathy or radiculopathy, lumbar region: Secondary | ICD-10-CM | POA: Diagnosis not present

## 2022-03-17 DIAGNOSIS — R6889 Other general symptoms and signs: Secondary | ICD-10-CM | POA: Diagnosis not present

## 2022-03-18 DIAGNOSIS — R1032 Left lower quadrant pain: Secondary | ICD-10-CM | POA: Diagnosis not present

## 2022-03-18 DIAGNOSIS — R197 Diarrhea, unspecified: Secondary | ICD-10-CM | POA: Diagnosis not present

## 2022-03-18 DIAGNOSIS — Z7982 Long term (current) use of aspirin: Secondary | ICD-10-CM | POA: Diagnosis not present

## 2022-03-18 DIAGNOSIS — K760 Fatty (change of) liver, not elsewhere classified: Secondary | ICD-10-CM | POA: Diagnosis not present

## 2022-03-18 DIAGNOSIS — K5730 Diverticulosis of large intestine without perforation or abscess without bleeding: Secondary | ICD-10-CM | POA: Diagnosis not present

## 2022-03-18 DIAGNOSIS — Z794 Long term (current) use of insulin: Secondary | ICD-10-CM | POA: Diagnosis not present

## 2022-03-18 DIAGNOSIS — Z87442 Personal history of urinary calculi: Secondary | ICD-10-CM | POA: Diagnosis not present

## 2022-03-18 DIAGNOSIS — Z87891 Personal history of nicotine dependence: Secondary | ICD-10-CM | POA: Diagnosis not present

## 2022-03-18 DIAGNOSIS — N3289 Other specified disorders of bladder: Secondary | ICD-10-CM | POA: Diagnosis not present

## 2022-03-18 DIAGNOSIS — Z885 Allergy status to narcotic agent status: Secondary | ICD-10-CM | POA: Diagnosis not present

## 2022-03-18 DIAGNOSIS — I10 Essential (primary) hypertension: Secondary | ICD-10-CM | POA: Diagnosis not present

## 2022-03-18 DIAGNOSIS — Z881 Allergy status to other antibiotic agents status: Secondary | ICD-10-CM | POA: Diagnosis not present

## 2022-03-18 DIAGNOSIS — E119 Type 2 diabetes mellitus without complications: Secondary | ICD-10-CM | POA: Diagnosis not present

## 2022-03-18 DIAGNOSIS — N132 Hydronephrosis with renal and ureteral calculous obstruction: Secondary | ICD-10-CM | POA: Diagnosis not present

## 2022-03-18 DIAGNOSIS — Z8616 Personal history of COVID-19: Secondary | ICD-10-CM | POA: Diagnosis not present

## 2022-03-18 DIAGNOSIS — Z888 Allergy status to other drugs, medicaments and biological substances status: Secondary | ICD-10-CM | POA: Diagnosis not present

## 2022-03-18 DIAGNOSIS — Z79899 Other long term (current) drug therapy: Secondary | ICD-10-CM | POA: Diagnosis not present

## 2022-03-21 DIAGNOSIS — N200 Calculus of kidney: Secondary | ICD-10-CM | POA: Diagnosis not present

## 2022-03-24 DIAGNOSIS — R6889 Other general symptoms and signs: Secondary | ICD-10-CM | POA: Diagnosis not present

## 2022-03-24 DIAGNOSIS — M47816 Spondylosis without myelopathy or radiculopathy, lumbar region: Secondary | ICD-10-CM | POA: Diagnosis not present

## 2022-03-26 ENCOUNTER — Other Ambulatory Visit: Payer: Self-pay | Admitting: Sports Medicine

## 2022-04-05 ENCOUNTER — Ambulatory Visit (INDEPENDENT_AMBULATORY_CARE_PROVIDER_SITE_OTHER): Payer: Medicare HMO | Admitting: Sports Medicine

## 2022-04-05 ENCOUNTER — Encounter: Payer: Self-pay | Admitting: Sports Medicine

## 2022-04-05 VITALS — BP 155/82 | HR 81 | Ht 73.0 in | Wt 246.0 lb

## 2022-04-05 DIAGNOSIS — Z23 Encounter for immunization: Secondary | ICD-10-CM | POA: Diagnosis not present

## 2022-04-05 DIAGNOSIS — E119 Type 2 diabetes mellitus without complications: Secondary | ICD-10-CM

## 2022-04-05 DIAGNOSIS — Z0000 Encounter for general adult medical examination without abnormal findings: Secondary | ICD-10-CM | POA: Diagnosis not present

## 2022-04-05 DIAGNOSIS — Z794 Long term (current) use of insulin: Secondary | ICD-10-CM

## 2022-04-05 DIAGNOSIS — K529 Noninfective gastroenteritis and colitis, unspecified: Secondary | ICD-10-CM

## 2022-04-05 LAB — POCT GLYCOSYLATED HEMOGLOBIN (HGB A1C): Hemoglobin A1C: 7.7 % — AB (ref 4.0–5.6)

## 2022-04-05 MED ORDER — TRULICITY 3 MG/0.5ML ~~LOC~~ SOAJ
3.0000 mg | SUBCUTANEOUS | 10 refills | Status: DC
  Filled 2022-05-18: qty 2, 28d supply, fill #0

## 2022-04-05 MED ORDER — HYOSCYAMINE SULFATE ER 0.375 MG PO TB12: 0.3750 mg | ORAL_TABLET | Freq: Two times a day (BID) | ORAL | 0 refills | Status: DC

## 2022-04-05 MED ORDER — RSVPREF3 VAC RECOMB ADJUVANTED 120 MCG/0.5ML IM SUSR: 0.5000 mL | Freq: Once | INTRAMUSCULAR | 0 refills | Status: AC

## 2022-04-05 NOTE — Progress Notes (Signed)
Subjective:    CC: Annual Physical Exam  HPI:  This patient is here for their annual physical  I reviewed the past medical history, family history, social history, surgical history, and allergies today and no changes were needed.  Please see the problem list section below in epic for further details.  Past Medical History: Past Medical History:  Diagnosis Date   Arthritis    neck   COVID-19    Depression    Diabetes mellitus without complication (HCC)    type 2   Hyperlipidemia    Hypertension    Pneumonia    covid pneumonia and covid july 2020, d/ced july 16th from [facility] stayed 9 days   Renal disorder    calculi, multiple lithotripsies, stents   Right cataract    Rotator cuff disorder    Spinal pain    Past Surgical History: Past Surgical History:  Procedure Laterality Date   ANTERIOR FUSION CERVICAL SPINE  2007   ARTHOSCOPIC ROTAOR CUFF REPAIR Left 03/21/2019   Procedure: ARTHROSCOPIC ROTATOR CUFF REPAIR;  Surgeon: Jones Broom, MD;  Location: [facility] [place];  Service: Orthopedics;  Laterality: Left;   CYSTOSCOPY/RETROGRADE/URETEROSCOPY/STONE EXTRACTION WITH BASKET  08/2021   EYE SURGERY Left    cataract   FOOT SURGERY Right    pin placed and removed   HERNIA REPAIR     inguinal   LITHOTRIPSY     several   SHOULDER ARTHROSCOPY WITH ROTATOR CUFF REPAIR AND SUBACROMIAL DECOMPRESSION Right 12/28/2015   Procedure: SHOULDER ARTHROSCOPY TAKE DOWN ROTATOR CUFF REPAIR AND SUBACROMIAL DECOMPRESSION, DEBRIDEMENT OF SLAP TEAR;  Surgeon: Jones Broom, MD;  Location:  SURGERY CENTER;  Service: Orthopedics;  Laterality: Right;  SHOULDER ARTHROSCOPY TAKE DOWN ROTATOR CUFF REPAIR AND SUBACROMIAL DECOMPRESSION   SHOULDER ARTHROSCOPY WITH SUBACROMIAL DECOMPRESSION Left 03/21/2019   Procedure: SHOULDER ARTHROSCOPY WITH SUBACROMIAL DECOMPRESSION;  Surgeon: Jones Broom, MD;  Location: [facility] [place] [facility];  Service: Orthopedics;   Laterality: Left;   VASECTOMY Bilateral    Social History: Social History   Socioeconomic History   Marital status: Married    Spouse name: Meriam Sprague Dewayne Hatch)   Number of children: 2   Years of education: 12th grade   Highest education level: High school graduate  Occupational History   Occupation: Retired  Tobacco Use   Smoking status: Former    Packs/day: 1.00    Years: 15.00    Total pack years: 15.00    Types: Cigarettes    Quit date: 10/01/2018    Years since quitting: 3.5   Smokeless tobacco: Never  Vaping Use   Vaping Use: Never used  Substance and Sexual Activity   Alcohol use: Yes    Alcohol/week: 0.0 standard drinks of alcohol    Comment: social   Drug use: No   Sexual activity: Yes    Birth control/protection: None  Other Topics Concern   Not on file  Social History Narrative   Lives with his wife. He has recently retired and is working on finding new hobbies. He loves to do things outdoors such as gardening or fishing.    Social Determinants of Health   Financial Resource Strain: Low Risk  (11/01/2021)   Overall Financial Resource Strain (CARDIA)    Difficulty of Paying Living Expenses: Not hard at all  Food Insecurity: No Food Insecurity (11/01/2021)   Hunger Vital Sign    Worried About Running Out of Food in the Last Year: Never true    Ran Out of Food in  the Last Year: Never true  Transportation Needs: No Transportation Needs (11/01/2021)   PRAPARE - Hydrologist (Medical): No    Lack of Transportation (Non-Medical): No  Physical Activity: Inactive (11/01/2021)   Exercise Vital Sign    Days of Exercise per Week: 0 days    Minutes of Exercise per Session: 0 min  Stress: No Stress Concern Present (11/01/2021)   [place]    Feeling of Stress : Not at all  Social Connections: Moderately Isolated (11/01/2021)   Social Connection and Isolation Panel [NHANES]     Frequency of Communication with Friends and Family: More than three times a week    Frequency of Social Gatherings with Friends and Family: Twice a week    Attends Religious Services: Never    Marine scientist or Organizations: No    Attends Music therapist: Never    Marital Status: Married   Family History: Family History  Problem Relation Age of Onset   Hypertension Mother    Heart failure Father    Diabetes Father    Hypertension Father    Heart failure Sister    Hypertension Sister    Allergies: Allergies  Allergen Reactions   Augmentin [Amoxicillin-Pot Clavulanate] Diarrhea   Codeine Diarrhea and Nausea Only   Duloxetine Diarrhea and Nausea Only    cymbalta is rash   Medications: See med rec.  Review of Systems: No headache, visual changes, nausea, vomiting, diarrhea, constipation, dizziness, abdominal pain, skin rash, fevers, chills, night sweats, swollen lymph nodes, weight loss, chest pain, body aches, joint swelling, muscle aches, shortness of breath, mood changes, visual or auditory hallucinations.  Objective:    General: Well Developed, well nourished, and in no acute distress.  Neuro: Alert and oriented x3, extra-ocular muscles intact, sensation grossly intact. Cranial nerves II through XII are intact, motor, sensory, and coordinative functions are all intact. HEENT: Normocephalic, atraumatic, pupils equal round reactive to light, neck supple, no masses, no lymphadenopathy, thyroid nonpalpable. Oropharynx, nasopharynx, external ear canals are unremarkable. Skin: Warm and dry, no rashes noted.  Cardiac: Regular rate and rhythm, no murmurs rubs or gallops.  Respiratory: Clear to auscultation bilaterally. Not using accessory muscles, speaking in full sentences.  Abdominal: Soft, nontender, nondistended, positive bowel sounds, no masses, no organomegaly.  Musculoskeletal: Shoulder, elbow, wrist, hip, knee, ankle stable, and with full range of  motion.  Impression and Recommendations:    The patient was counselled, risk factors were discussed, anticipatory guidance given.  Annual physical exam Medicare physical as above, up-to-date on screenings. He would like to do the RSV vaccine, this will be sent to his pharmacy. Return to see me in a year for annual physical.  Diabetes mellitus, type 2 A1c elevated at 2.2%, increasing Trulicity to 3 mg. Recheck A1c in 3 months.  Chronic diarrhea Xaivier and his wife do indicate that he has had several years of chronic diarrhea, flatulence. Not associated with any food, no melena or hematochezia. He has been using a lot of Imodium. We will go ahead and add Levbid, but I would like him to touch base with gastroenterology as well. Continue Imodium now and adding low FODMAP diet.   ____________________________________________ Gwen Her. Dianah Field, M.D., ABFM., CAQSM., AME. Primary Care and Sports Medicine [facility]  Adjunct Professor of [employer] [facility] [employer]  Risk manager

## 2022-04-05 NOTE — Assessment & Plan Note (Signed)
A1c elevated at 8.6%, increasing Trulicity to 3 mg. Recheck A1c in 3 months.

## 2022-04-05 NOTE — Assessment & Plan Note (Signed)
Robert Hines and his wife do indicate that he has had several years of chronic diarrhea, flatulence. Not associated with any food, no melena or hematochezia. He has been using a lot of Imodium. We will go ahead and add Levbid, but I would like him to touch base with gastroenterology as well. Continue Imodium now and adding low FODMAP diet.

## 2022-04-05 NOTE — Assessment & Plan Note (Signed)
Medicare physical as above, up-to-date on screenings. He would like to do the RSV vaccine, this will be sent to his pharmacy. Return to see me in a year for annual physical.

## 2022-04-06 LAB — CBC
HCT: 39.2 % (ref 38.5–50.0)
Hemoglobin: 13.5 g/dL (ref 13.2–17.1)
MCH: 34.9 pg — ABNORMAL HIGH (ref 27.0–33.0)
MCHC: 34.4 g/dL (ref 32.0–36.0)
MCV: 101.3 fL — ABNORMAL HIGH (ref 80.0–100.0)
MPV: 10.8 fL (ref 7.5–12.5)
Platelets: 90 10*3/uL — ABNORMAL LOW (ref 140–400)
RBC: 3.87 10*6/uL — ABNORMAL LOW (ref 4.20–5.80)
RDW: 13.4 % (ref 11.0–15.0)
WBC: 7 10*3/uL (ref 3.8–10.8)

## 2022-04-06 LAB — COMPLETE METABOLIC PANEL WITH GFR
AG Ratio: 1.9 (calc) (ref 1.0–2.5)
ALT: 37 U/L (ref 9–46)
AST: 29 U/L (ref 10–35)
Albumin: 4.2 g/dL (ref 3.6–5.1)
Alkaline phosphatase (APISO): 61 U/L (ref 35–144)
BUN: 14 mg/dL (ref 7–25)
CO2: 27 mmol/L (ref 20–32)
Calcium: 9.2 mg/dL (ref 8.6–10.3)
Chloride: 105 mmol/L (ref 98–110)
Creat: 1.09 mg/dL (ref 0.70–1.35)
Globulin: 2.2 g/dL (calc) (ref 1.9–3.7)
Glucose, Bld: 112 mg/dL (ref 65–139)
Potassium: 4.6 mmol/L (ref 3.5–5.3)
Sodium: 141 mmol/L (ref 135–146)
Total Bilirubin: 0.3 mg/dL (ref 0.2–1.2)
Total Protein: 6.4 g/dL (ref 6.1–8.1)
eGFR: 74 mL/min/{1.73_m2} (ref >=60)

## 2022-04-06 LAB — LIPID PANEL
Cholesterol: 129 mg/dL (ref <200)
HDL: 44 mg/dL (ref >=40)
LDL Cholesterol (Calc): 58 mg/dL (calc)
Non-HDL Cholesterol (Calc): 85 mg/dL (calc) (ref <130)
Total CHOL/HDL Ratio: 2.9 (calc) (ref <5.0)
Triglycerides: 194 mg/dL — ABNORMAL HIGH (ref <150)

## 2022-04-06 LAB — TSH: TSH: 3.99 mIU/L (ref 0.40–4.50)

## 2022-04-12 DIAGNOSIS — R1084 Generalized abdominal pain: Secondary | ICD-10-CM | POA: Diagnosis not present

## 2022-04-12 DIAGNOSIS — R195 Other fecal abnormalities: Secondary | ICD-10-CM | POA: Diagnosis not present

## 2022-04-12 DIAGNOSIS — R197 Diarrhea, unspecified: Secondary | ICD-10-CM | POA: Diagnosis not present

## 2022-04-25 ENCOUNTER — Other Ambulatory Visit: Payer: Self-pay | Admitting: Sports Medicine

## 2022-04-25 DIAGNOSIS — L84 Corns and callosities: Secondary | ICD-10-CM | POA: Diagnosis not present

## 2022-04-25 DIAGNOSIS — S90112A Contusion of left great toe without damage to nail, initial encounter: Secondary | ICD-10-CM | POA: Diagnosis not present

## 2022-04-25 DIAGNOSIS — M2011 Hallux valgus (acquired), right foot: Secondary | ICD-10-CM | POA: Diagnosis not present

## 2022-04-25 DIAGNOSIS — B351 Tinea unguium: Secondary | ICD-10-CM | POA: Diagnosis not present

## 2022-04-25 MED ORDER — SIMVASTATIN 40 MG PO TABS: 40.0000 mg | ORAL_TABLET | Freq: Every day | ORAL | 3 refills | Status: DC

## 2022-05-05 ENCOUNTER — Other Ambulatory Visit: Payer: Self-pay | Admitting: Sports Medicine

## 2022-05-05 DIAGNOSIS — M5416 Radiculopathy, lumbar region: Secondary | ICD-10-CM | POA: Diagnosis not present

## 2022-05-05 DIAGNOSIS — Z794 Long term (current) use of insulin: Secondary | ICD-10-CM

## 2022-05-08 ENCOUNTER — Other Ambulatory Visit: Payer: Self-pay | Admitting: Sports Medicine

## 2022-05-17 ENCOUNTER — Other Ambulatory Visit (HOSPITAL_BASED_OUTPATIENT_CLINIC_OR_DEPARTMENT_OTHER): Payer: Self-pay

## 2022-05-17 ENCOUNTER — Encounter: Payer: Self-pay | Admitting: Sports Medicine

## 2022-05-17 MED ORDER — TRULICITY 3 MG/0.5ML ~~LOC~~ SOAJ
3.0000 mg | SUBCUTANEOUS | 10 refills | Status: DC
  Filled 2022-05-17: qty 2, 28d supply, fill #0
  Filled 2022-06-08: qty 2, 28d supply, fill #1

## 2022-05-18 ENCOUNTER — Other Ambulatory Visit (HOSPITAL_COMMUNITY): Payer: Self-pay

## 2022-05-20 DIAGNOSIS — M5416 Radiculopathy, lumbar region: Secondary | ICD-10-CM | POA: Diagnosis not present

## 2022-05-30 IMAGING — XA Imaging study
1 series · 1 of 1 positions shown · non-contrast
Comparison: none

CLINICAL DATA: Low back and left lower extremity pain. L5-S1 disc
degeneration with a small leftward disc herniation and annular
fissure. No spinal stenosis, but this might be a source of
left S1 radiculitis. Furthermore, left neural foraminal stenosis
here appears moderate but related to bony spurring. No obvious
foraminal disc. Query left L5 radiculitis. Disc desiccation at L4-L5
with mild disc bulging and annular
fissure. Mild L3 disc bulging. Mild bilateral L3 and L4 neural
foraminal stenosis.

[Series 1: ortho standard · 1 of 1 slices shown]
[im 1/1]
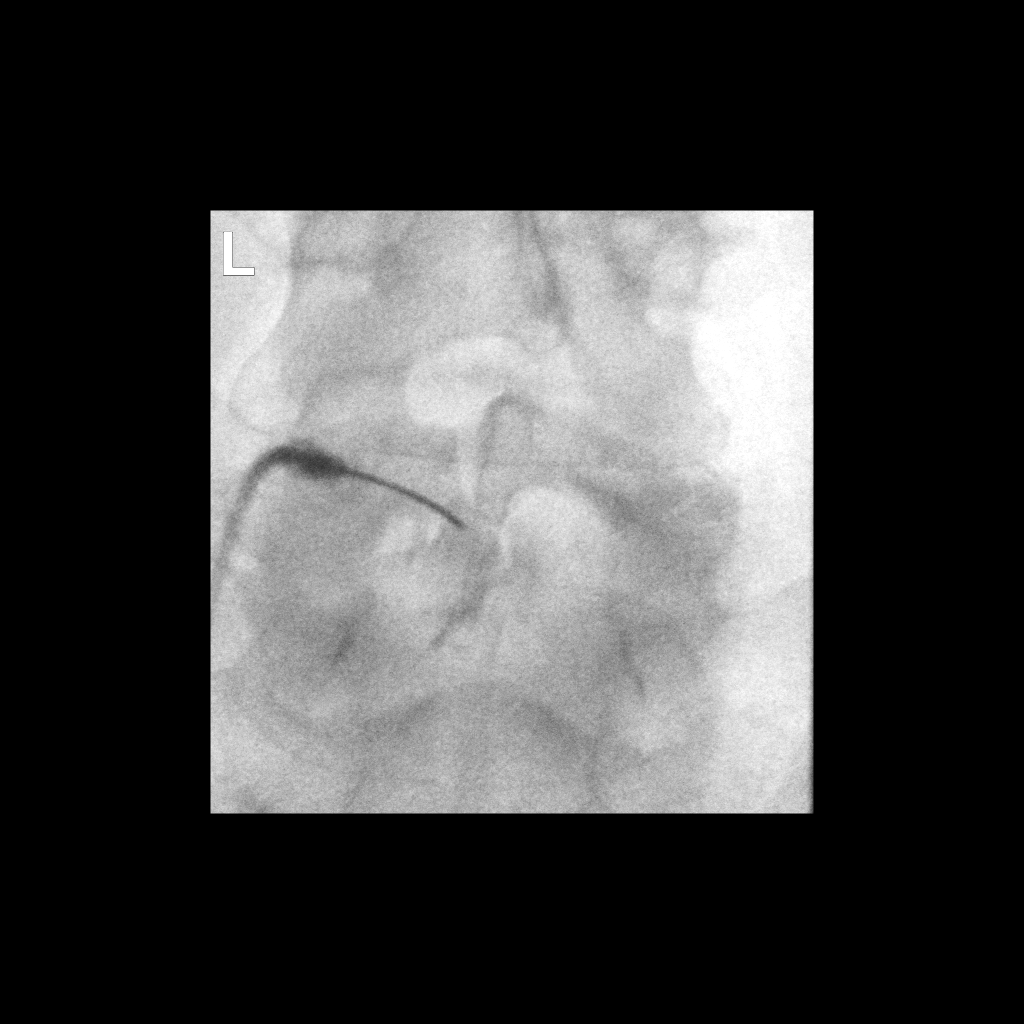

[1 of 1 positions shown; findings below may reference images not displayed]

EXAM:
LUMBAR EPIDURAL INJECTION:

DIAGNOSTIC EPIDURAL INJECTION:

THERAPEUTIC EPIDURAL INJECTION:

PROCEDURE:
The procedure, risks, benefits, and alternatives were explained to
the patient. Questions regarding the procedure were encouraged and
answered. The patient understands and consents to the procedure.

An interlaminar approach was performed on the left at L5-S1. The
overlying skin was cleansed and anesthetized. A 20 gauge epidural
needle was advanced using loss-of-resistance technique.

Injection of Isovue-M 200 shows a good epidural pattern with spread
above and below the level of needle placement, primarily on the
left, with some crossing of midline. No vascular opacification is
seen.

80mg of Depo-Medrol mixed with 2ml lidocaine 1% were instilled. The
procedure was well-tolerated, and the patient was discharged thirty
minutes following the injection in good condition.

FLUOROSCOPY:
Radiation Exposure Index (as provided by the fluoroscopic device):
4.1 mGy air Kerma

COMPLICATIONS:
None immediate
IMPRESSION: Technically successful epidural injection on the left at L5-S1.

## 2022-06-08 ENCOUNTER — Other Ambulatory Visit: Payer: Self-pay | Admitting: Sports Medicine

## 2022-06-21 DIAGNOSIS — M47816 Spondylosis without myelopathy or radiculopathy, lumbar region: Secondary | ICD-10-CM | POA: Diagnosis not present

## 2022-06-21 DIAGNOSIS — M5416 Radiculopathy, lumbar region: Secondary | ICD-10-CM | POA: Diagnosis not present

## 2022-06-28 DIAGNOSIS — G8929 Other chronic pain: Secondary | ICD-10-CM | POA: Diagnosis not present

## 2022-06-28 DIAGNOSIS — M545 Low back pain, unspecified: Secondary | ICD-10-CM | POA: Diagnosis not present

## 2022-06-28 DIAGNOSIS — E1142 Type 2 diabetes mellitus with diabetic polyneuropathy: Secondary | ICD-10-CM | POA: Diagnosis not present

## 2022-07-06 ENCOUNTER — Ambulatory Visit (INDEPENDENT_AMBULATORY_CARE_PROVIDER_SITE_OTHER): Payer: Medicare HMO | Admitting: Sports Medicine

## 2022-07-06 ENCOUNTER — Encounter: Payer: Self-pay | Admitting: Sports Medicine

## 2022-07-06 ENCOUNTER — Other Ambulatory Visit: Payer: Self-pay | Admitting: Sports Medicine

## 2022-07-06 DIAGNOSIS — I10 Essential (primary) hypertension: Secondary | ICD-10-CM | POA: Diagnosis not present

## 2022-07-06 DIAGNOSIS — R109 Unspecified abdominal pain: Secondary | ICD-10-CM | POA: Diagnosis not present

## 2022-07-06 DIAGNOSIS — K760 Fatty (change of) liver, not elsewhere classified: Secondary | ICD-10-CM | POA: Diagnosis not present

## 2022-07-06 DIAGNOSIS — E119 Type 2 diabetes mellitus without complications: Secondary | ICD-10-CM | POA: Diagnosis not present

## 2022-07-06 DIAGNOSIS — Z794 Long term (current) use of insulin: Secondary | ICD-10-CM

## 2022-07-06 DIAGNOSIS — N281 Cyst of kidney, acquired: Secondary | ICD-10-CM | POA: Diagnosis not present

## 2022-07-06 DIAGNOSIS — N1330 Unspecified hydronephrosis: Secondary | ICD-10-CM | POA: Diagnosis not present

## 2022-07-06 DIAGNOSIS — N200 Calculus of kidney: Secondary | ICD-10-CM

## 2022-07-06 DIAGNOSIS — N2889 Other specified disorders of kidney and ureter: Secondary | ICD-10-CM | POA: Diagnosis not present

## 2022-07-06 MED ORDER — LISINOPRIL 40 MG PO TABS: 40.0000 mg | ORAL_TABLET | Freq: Every day | ORAL | 3 refills | Status: DC

## 2022-07-06 MED ORDER — KETOROLAC TROMETHAMINE 10 MG PO TABS: 10.0000 mg | ORAL_TABLET | Freq: Three times a day (TID) | ORAL | 0 refills | Status: DC | PRN

## 2022-07-06 NOTE — Progress Notes (Addendum)
    Procedures performed today:    None.  Independent interpretation of notes and tests performed by another provider:   None.  Brief History, Exam, Impression, and Recommendations:    Diabetes mellitus, type 2 Robert Hines returns, A1c at the last visit was 7.7%, we increased his Trulicity to 3 mg, in spite of the holidays he lost a couple pounds. Point-of-care A1c was 8.4%, we think this is more of an error so we will go ahead and get a official laboratory hemoglobin A1c today.  Update: Hemoglobin A1c from blood testing is 8%, this is still higher than at the last visit.  We will increase his Trulicity to 4.5 mg weekly.  Recheck in 3 months.  Essential hypertension, benign Blood pressure still elevated, increasing lisinopril to 40 mg daily. He can recheck at home and let me know over the next couple of weeks.  Chronic process not at goal with pharmacologic intervention  ____________________________________________ Gwen Her. Dianah Field, M.D., ABFM., CAQSM., AME. Primary Care and Sports Medicine [facility]  Adjunct Professor of [employer] of Medicine  Risk manager

## 2022-07-06 NOTE — Assessment & Plan Note (Signed)
Also dealing with recurrent nephrolithiasis. Refilling Toradol, he does see his urologist later today.

## 2022-07-06 NOTE — Assessment & Plan Note (Addendum)
Robert Hines returns, A1c at the last visit was 7.7%, we increased his Trulicity to 3 mg, in spite of the holidays he lost a couple pounds. Point-of-care A1c was 8.4%, we think this is more of an error so we will go ahead and get a official laboratory hemoglobin A1c today.  Update: Hemoglobin A1c from blood testing is 8%, this is still higher than at the last visit.  We will increase his Trulicity to 4.5 mg weekly.  Recheck in 3 months.

## 2022-07-06 NOTE — Assessment & Plan Note (Signed)
Blood pressure still elevated, increasing lisinopril to 40 mg daily. He can recheck at home and let me know over the next couple of weeks.

## 2022-07-07 LAB — HEMOGLOBIN A1C
Hgb A1c MFr Bld: 8 % of total Hgb — ABNORMAL HIGH (ref <5.7)
Mean Plasma Glucose: 183 mg/dL
eAG (mmol/L): 10.1 mmol/L

## 2022-07-07 LAB — MICROALBUMIN / CREATININE URINE RATIO
Creatinine, Urine: 172 mg/dL (ref 20–320)
Microalb Creat Ratio: 130 mcg/mg creat — ABNORMAL HIGH (ref <30)
Microalb, Ur: 22.4 mg/dL

## 2022-07-07 MED ORDER — TRULICITY 4.5 MG/0.5ML ~~LOC~~ SOAJ
4.5000 mg | SUBCUTANEOUS | 11 refills | Status: DC
  Filled 2022-10-24: qty 2, 28d supply, fill #0
  Filled 2022-11-17: qty 2, 28d supply, fill #1

## 2022-07-07 NOTE — Addendum Note (Signed)
Addended by: Silverio Decamp on: 07/07/2022 08:33 AM   Modules accepted: Orders

## 2022-07-11 DIAGNOSIS — M2041 Other hammer toe(s) (acquired), right foot: Secondary | ICD-10-CM | POA: Diagnosis not present

## 2022-07-11 DIAGNOSIS — L84 Corns and callosities: Secondary | ICD-10-CM | POA: Diagnosis not present

## 2022-07-11 DIAGNOSIS — B351 Tinea unguium: Secondary | ICD-10-CM | POA: Diagnosis not present

## 2022-07-11 DIAGNOSIS — G609 Hereditary and idiopathic neuropathy, unspecified: Secondary | ICD-10-CM | POA: Diagnosis not present

## 2022-07-15 DIAGNOSIS — N200 Calculus of kidney: Secondary | ICD-10-CM | POA: Diagnosis not present

## 2022-07-19 DIAGNOSIS — M5416 Radiculopathy, lumbar region: Secondary | ICD-10-CM | POA: Diagnosis not present

## 2022-07-20 DIAGNOSIS — K529 Noninfective gastroenteritis and colitis, unspecified: Secondary | ICD-10-CM | POA: Diagnosis not present

## 2022-07-26 DIAGNOSIS — R197 Diarrhea, unspecified: Secondary | ICD-10-CM | POA: Diagnosis not present

## 2022-08-03 DIAGNOSIS — N200 Calculus of kidney: Secondary | ICD-10-CM | POA: Diagnosis not present

## 2022-08-15 DIAGNOSIS — M5416 Radiculopathy, lumbar region: Secondary | ICD-10-CM | POA: Diagnosis not present

## 2022-08-15 DIAGNOSIS — M48061 Spinal stenosis, lumbar region without neurogenic claudication: Secondary | ICD-10-CM | POA: Diagnosis not present

## 2022-08-15 DIAGNOSIS — M4727 Other spondylosis with radiculopathy, lumbosacral region: Secondary | ICD-10-CM | POA: Diagnosis not present

## 2022-08-29 ENCOUNTER — Other Ambulatory Visit: Payer: Self-pay | Admitting: Sports Medicine

## 2022-08-29 DIAGNOSIS — Z794 Long term (current) use of insulin: Secondary | ICD-10-CM

## 2022-08-29 DIAGNOSIS — F32A Depression, unspecified: Secondary | ICD-10-CM

## 2022-09-05 DIAGNOSIS — M47816 Spondylosis without myelopathy or radiculopathy, lumbar region: Secondary | ICD-10-CM | POA: Diagnosis not present

## 2022-09-05 DIAGNOSIS — M5416 Radiculopathy, lumbar region: Secondary | ICD-10-CM | POA: Diagnosis not present

## 2022-09-06 ENCOUNTER — Other Ambulatory Visit: Payer: Self-pay | Admitting: Sports Medicine

## 2022-09-19 ENCOUNTER — Telehealth: Payer: Self-pay

## 2022-09-19 DIAGNOSIS — E119 Type 2 diabetes mellitus without complications: Secondary | ICD-10-CM

## 2022-09-19 DIAGNOSIS — F32A Depression, unspecified: Secondary | ICD-10-CM

## 2022-09-19 MED ORDER — GLIPIZIDE 5 MG PO TABS
5.0000 mg | ORAL_TABLET | Freq: Every day | ORAL | 3 refills | Status: DC
Start: 2022-09-19 — End: 2022-09-19

## 2022-09-19 MED ORDER — METFORMIN HCL 1000 MG PO TABS
1000.0000 mg | ORAL_TABLET | Freq: Two times a day (BID) | ORAL | 3 refills | Status: DC
Start: 2022-09-19 — End: 2023-09-21

## 2022-09-19 MED ORDER — METFORMIN HCL 1000 MG PO TABS
1000.0000 mg | ORAL_TABLET | Freq: Two times a day (BID) | ORAL | 3 refills | Status: DC
Start: 2022-09-19 — End: 2022-09-19

## 2022-09-19 MED ORDER — BUPROPION HCL ER (XL) 300 MG PO TB24
300.0000 mg | ORAL_TABLET | Freq: Every day | ORAL | 3 refills | Status: DC
Start: 2022-09-19 — End: 2023-01-19

## 2022-09-19 MED ORDER — GLIPIZIDE 5 MG PO TABS
5.0000 mg | ORAL_TABLET | Freq: Every day | ORAL | 3 refills | Status: DC
Start: 2022-09-19 — End: 2023-10-26

## 2022-09-19 NOTE — Telephone Encounter (Signed)
Med refill

## 2022-09-21 DIAGNOSIS — L84 Corns and callosities: Secondary | ICD-10-CM | POA: Diagnosis not present

## 2022-09-21 DIAGNOSIS — B351 Tinea unguium: Secondary | ICD-10-CM | POA: Diagnosis not present

## 2022-09-21 DIAGNOSIS — E0840 Diabetes mellitus due to underlying condition with diabetic neuropathy, unspecified: Secondary | ICD-10-CM | POA: Diagnosis not present

## 2022-09-21 DIAGNOSIS — M79675 Pain in left toe(s): Secondary | ICD-10-CM | POA: Diagnosis not present

## 2022-09-21 DIAGNOSIS — G609 Hereditary and idiopathic neuropathy, unspecified: Secondary | ICD-10-CM | POA: Diagnosis not present

## 2022-09-27 DIAGNOSIS — M47816 Spondylosis without myelopathy or radiculopathy, lumbar region: Secondary | ICD-10-CM | POA: Diagnosis not present

## 2022-10-04 ENCOUNTER — Telehealth: Payer: Self-pay

## 2022-10-04 ENCOUNTER — Other Ambulatory Visit: Payer: Self-pay | Admitting: Sports Medicine

## 2022-10-04 MED ORDER — GABAPENTIN 300 MG PO CAPS: ORAL_CAPSULE | ORAL | 1 refills | Status: DC

## 2022-10-04 NOTE — Telephone Encounter (Signed)
Med refill

## 2022-10-12 DIAGNOSIS — M47816 Spondylosis without myelopathy or radiculopathy, lumbar region: Secondary | ICD-10-CM | POA: Diagnosis not present

## 2022-10-17 ENCOUNTER — Ambulatory Visit (INDEPENDENT_AMBULATORY_CARE_PROVIDER_SITE_OTHER): Payer: Medicare HMO

## 2022-10-17 ENCOUNTER — Ambulatory Visit
Admission: EM | Admit: 2022-10-17 | Discharge: 2022-10-17 | Disposition: A | Discharge status: Home / Self Care | Payer: Medicare HMO

## 2022-10-17 ENCOUNTER — Encounter: Payer: Self-pay | Admitting: Emergency Medicine

## 2022-10-17 DIAGNOSIS — N201 Calculus of ureter: Secondary | ICD-10-CM

## 2022-10-17 DIAGNOSIS — R1032 Left lower quadrant pain: Secondary | ICD-10-CM

## 2022-10-17 DIAGNOSIS — K6389 Other specified diseases of intestine: Secondary | ICD-10-CM | POA: Diagnosis not present

## 2022-10-17 DIAGNOSIS — N202 Calculus of kidney with calculus of ureter: Secondary | ICD-10-CM | POA: Diagnosis not present

## 2022-10-17 DIAGNOSIS — N281 Cyst of kidney, acquired: Secondary | ICD-10-CM | POA: Diagnosis not present

## 2022-10-17 LAB — POCT URINALYSIS DIP (MANUAL ENTRY)
Bilirubin, UA: NEGATIVE
Blood, UA: NEGATIVE
Glucose, UA: NEGATIVE mg/dL
Ketones, POC UA: NEGATIVE mg/dL
Leukocytes, UA: NEGATIVE
Nitrite, UA: NEGATIVE
Protein Ur, POC: NEGATIVE mg/dL
Spec Grav, UA: 1.02 (ref 1.010–1.025)
Urobilinogen, UA: 0.2 E.U./dL
pH, UA: 6.5 (ref 5.0–8.0)

## 2022-10-17 LAB — COMPREHENSIVE METABOLIC PANEL: eGFR: 65

## 2022-10-17 LAB — I-STAT CREATININE (MANUAL ENTRY): Creatinine, Ser: 1.2 — AB (ref 0.50–1.10)

## 2022-10-17 MED ORDER — IOHEXOL 300 MG/ML  SOLN
100.0000 mL | Freq: Once | INTRAMUSCULAR | Status: AC | PRN
  Administered 2022-10-17: 100 mL via INTRAVENOUS

## 2022-10-17 NOTE — ED Triage Notes (Signed)
Left lower quadrant  pain since Friday night  Hx of kidney stones  Does not feel lie a kidney stone No hematuria  Here w/ his wife Diarrhea on Friday night  Appetite normal  Wife concerned  about his pancreas

## 2022-10-17 NOTE — Discharge Instructions (Addendum)
Patient/wife have been advised of CT abdomen/pelvis with oral/IV contrast results with hardcopy provided.  Advised patient to go to [facility] ED NOW  for further evaluation of distal left ureter with moderate left sided collecting system dilation.

## 2022-10-17 NOTE — ED Notes (Signed)
Patient is being discharged from the Urgent Care and sent to the Emergency Department via POV driven by wife. Per Trevor Iha FNP, patient is in need of higher level of care due to needing further eval of LLQ pain and ureter stones. Patient is aware and verbalizes understanding of plan of care.  Vitals:   10/17/22 1205  BP: (!) 143/85  Pulse: 85  Resp: 18  Temp: 98.3 F (36.8 C)  SpO2: 97%

## 2022-10-17 NOTE — ED Provider Notes (Signed)
[facility]    CSN: [id number] Arrival date & time: 10/17/22  1151      History   Chief Complaint Chief Complaint  Patient presents with   Abdominal Pain    Left lower     HPI Robert Hines is a 67 y.o. male.   HPI Very pleasant 67 year old male presents with left lower quadrant pain since Friday night.  Reports history of kidney stones although this does not feel like kidney stone.  Patient is accompanied by his wife who is concerned with possible pancreatitis.  Patient/wife report diarrhea on Friday night which has resolved.  PMH significant for obesity, T2DM and HTN.  Past Medical History:  Diagnosis Date   Arthritis    neck   COVID-19    Depression    Diabetes mellitus without complication (HCC)    type 2   Hyperlipidemia    Hypertension    Pneumonia    covid pneumonia and covid july 2020, d/ced july 16th from [facility] stayed 9 days   Renal disorder    calculi, multiple lithotripsies, stents   Right cataract    Rotator cuff disorder    Spinal pain     Patient Active Problem List   Diagnosis Date Noted   Chronic diarrhea 04/05/2022   Mild cognitive impairment 11/22/2021   Benign essential tremor 06/23/2021   Excessive daytime sleepiness 05/26/2021   Diabetic neuropathy (HCC) 04/30/2021   Lumbar spondylosis 04/30/2021   Diabetic ulcer of left foot (HCC) 04/28/2021   Primary osteoarthritis of left hip 04/28/2021   Screening for AAA (abdominal aortic aneurysm) 08/14/2020   Thrombocytopenia (HCC) 02/03/2019   GERD without esophagitis 12/25/2018   Pneumonia due to COVID-19 virus 12/20/2018   Combined forms of age-related cataract of left eye 08/02/2018   Mass of left thigh 05/24/2018   History of DVT (deep vein thrombosis) 05/09/2018   Primary insomnia 10/30/2017   S/P cervical spinal fusion 09/28/2017   Left shoulder pain 01/05/2017   Depression 05/02/2016   Obesity 01/23/2015   Right shoulder pain 01/23/2015   Former smoker  08/28/2014   Nephrolithiasis 07/25/2014   Diabetes mellitus, type 2 (HCC) 07/25/2014   Essential hypertension, benign 07/25/2014   Annual physical exam 07/25/2014   Hyperlipidemia 07/25/2014    Past Surgical History:  Procedure Laterality Date   ANTERIOR FUSION CERVICAL SPINE  2007   ARTHOSCOPIC ROTAOR CUFF REPAIR Left 03/21/2019   Procedure: ARTHROSCOPIC ROTATOR CUFF REPAIR;  Surgeon: Jones Broom, MD;  Location: [facility];  Service: Orthopedics;  Laterality: Left;   CYSTOSCOPY/RETROGRADE/URETEROSCOPY/STONE EXTRACTION WITH BASKET  08/2021   EYE SURGERY Left    cataract   FOOT SURGERY Right    pin placed and removed   HERNIA REPAIR     inguinal   LITHOTRIPSY     several   SHOULDER ARTHROSCOPY WITH ROTATOR CUFF REPAIR AND SUBACROMIAL DECOMPRESSION Right 12/28/2015   Procedure: SHOULDER ARTHROSCOPY TAKE DOWN ROTATOR CUFF REPAIR AND SUBACROMIAL DECOMPRESSION, DEBRIDEMENT OF SLAP TEAR;  Surgeon: Jones Broom, MD;  Location: [facility];  Service: Orthopedics;  Laterality: Right;  SHOULDER ARTHROSCOPY TAKE DOWN ROTATOR CUFF REPAIR AND SUBACROMIAL DECOMPRESSION   SHOULDER ARTHROSCOPY WITH SUBACROMIAL DECOMPRESSION Left 03/21/2019   Procedure: SHOULDER ARTHROSCOPY WITH SUBACROMIAL DECOMPRESSION;  Surgeon: Jones Broom, MD;  Location: [facility];  Service: Orthopedics;  Laterality: Left;   VASECTOMY Bilateral        Home Medications    Prior to Admission medications   Medication Sig  Start Date End Date Taking? Authorizing Provider  HYDROcodone-acetaminophen (NORCO/VICODIN) 5-325 MG tablet Take by mouth. 11/06/13  Yes [provider]  aspirin 81 MG chewable tablet Chew by mouth daily.    [provider]  buPROPion (WELLBUTRIN XL) 300 MG 24 hr tablet Take 1 tablet (300 mg total) by mouth daily. 09/19/22   Monica Becton, MD  Dapagliflozin-metFORMIN HCl ER (XIGDUO XR) 03-999 MG TB24 Take 1 tablet by mouth  daily. 08/07/20   Monica Becton, MD  Dulaglutide (TRULICITY) 4.5 MG/0.5ML SOPN Inject 4.5 mg into the skin once a week. 07/07/22   Monica Becton, MD  gabapentin (NEURONTIN) 300 MG capsule TAKE 3 CAPSULES IN THE MORNING AND 4 CAPSULES IN THE EVENING 10/04/22   Monica Becton, MD  glipiZIDE (GLUCOTROL) 5 MG tablet Take 1 tablet (5 mg total) by mouth daily before breakfast. 09/19/22   Monica Becton, MD  hyoscyamine (LEVBID) 0.375 MG 12 hr tablet Take 1 tablet (0.375 mg total) by mouth 2 (two) times daily. 04/05/22   Monica Becton, MD  Insulin Glargine [facility]) 100 UNIT/ML INJECT 94 UNITS INTO THE SKIN EVERY NIGHT AT BEDTIME 05/05/22   Monica Becton, MD  Insulin Pen Needle 32G X 4 MM MISC Use as needed with toujeo pen 08/04/15   Monica Becton, MD  ketorolac (TORADOL) 10 MG tablet Take 1 tablet (10 mg total) by mouth every 8 (eight) hours as needed. 07/06/22   Monica Becton, MD  lisinopril (ZESTRIL) 40 MG tablet Take 1 tablet (40 mg total) by mouth daily. 07/06/22   Monica Becton, MD  metFORMIN (GLUCOPHAGE) 1000 MG tablet Take 1 tablet (1,000 mg total) by mouth 2 (two) times daily with a meal. 09/19/22   Monica Becton, MD  Multiple Vitamin (MULTIVITAMIN ADULT PO) Take by mouth.    [provider]  Probiotic Product (PROBIOTIC-10 PO) Take by mouth.    [provider]  simvastatin (ZOCOR) 40 MG tablet Take 1 tablet (40 mg total) by mouth daily. 04/25/22   Monica Becton, MD  tamsulosin (FLOMAX) 0.4 MG CAPS capsule Take 0.4 mg by mouth daily. 07/12/21   [provider]  traMADol (ULTRAM) 50 MG tablet Take 2 tablets (100 mg total) by mouth 2 (two) times daily. 01/12/22   Monica Becton, MD  zolpidem (AMBIEN) 10 MG tablet TAKE ONE TABLET BY MOUTH EVERY NIGHT AT BEDTIME AS NEEDED FOR SLEEP 05/09/22   Monica Becton, MD    Family History Family History  Problem Relation Age  of Onset   Hypertension Mother    Heart failure Father    Diabetes Father    Hypertension Father    Heart failure Sister    Hypertension Sister     Social History Social History   Tobacco Use   Smoking status: Former    Packs/day: 1.00    Years: 15.00    Additional pack years: 0.00    Total pack years: 15.00    Types: Cigarettes    Quit date: 10/01/2018    Years since quitting: 4.0   Smokeless tobacco: Never  Vaping Use   Vaping Use: Never used  Substance Use Topics   Alcohol use: Yes    Alcohol/week: 0.0 standard drinks of alcohol    Comment: social   Drug use: No     Allergies   Augmentin [amoxicillin-pot clavulanate], Codeine, and Duloxetine   Review of Systems Review of Systems  Gastrointestinal:  Positive for  abdominal pain.     Physical Exam Triage Vital Signs ED Triage Vitals  Enc Vitals Group     BP 10/17/22 1205 (!) 143/85     Pulse Rate 10/17/22 1205 85     Resp 10/17/22 1205 18     Temp 10/17/22 1205 98.3 F (36.8 C)     Temp Source 10/17/22 1205 Oral     SpO2 10/17/22 1205 97 %     Weight 10/17/22 1211 240 lb (108.9 kg)     Height 10/17/22 1211 6\' 1"  (1.854 m)     Head Circumference --      Peak Flow --      Pain Score 10/17/22 1210 1     Pain Loc --      Pain Edu? --      Excl. in GC? --    No data found.  Updated Vital Signs BP (!) 143/85 (BP Location: Left Arm)   Pulse 85   Temp 98.3 F (36.8 C) (Oral)   Resp 18   Ht 6\' 1"  (1.854 m)   Wt 240 lb (108.9 kg)   SpO2 97%   BMI 31.66 kg/m    Physical Exam Vitals and nursing note reviewed.  Constitutional:      Appearance: He is well-developed. He is obese.  HENT:     Head: Normocephalic and atraumatic.  Eyes:     Extraocular Movements: Extraocular movements intact.     Pupils: Pupils are equal, round, and reactive to light.  Cardiovascular:     Rate and Rhythm: Normal rate and regular rhythm.     Heart sounds: Normal heart sounds. No murmur heard. Pulmonary:     Effort:  Pulmonary effort is normal.     Breath sounds: Normal breath sounds. No wheezing, rhonchi or rales.  Abdominal:     General: Abdomen is flat. Bowel sounds are decreased. There is no distension.     Palpations: Abdomen is soft. There is no shifting dullness, fluid wave, hepatomegaly, splenomegaly, mass or pulsatile mass.     Tenderness: There is abdominal tenderness in the left lower quadrant. There is no right CVA tenderness, left CVA tenderness or rebound. Negative signs include McBurney's sign, psoas sign and obturator sign.     Hernia: No hernia is present.  Skin:    General: Skin is warm and dry.  Neurological:     General: No focal deficit present.     Mental Status: He is alert and oriented to person, place, and time.  Psychiatric:        Mood and Affect: Mood normal.        Behavior: Behavior normal.      UC Treatments / Results  Labs (all labs ordered are listed, but only abnormal results are displayed) Labs Reviewed  POCT URINALYSIS DIP (MANUAL ENTRY)    EKG   Radiology CT ABDOMEN PELVIS W CONTRAST  Result Date: 10/17/2022 CLINICAL DATA:  Left lower quadrant abdominal pain EXAM: CT ABDOMEN AND PELVIS WITH CONTRAST TECHNIQUE: Multidetector CT imaging of the abdomen and pelvis was performed using the standard protocol following bolus administration of intravenous contrast. RADIATION DOSE REDUCTION: This exam was performed according to the departmental dose-optimization program which includes automated exposure control, adjustment of the mA and/or kV according to patient size and/or use of iterative reconstruction technique. CONTRAST:  OMNIPAQUE IOHEXOL 300 MG/ML  SOLN COMPARISON:  CT 12/31/2015 and older FINDINGS: Lower chest: Mild breathing motion at the lung bases. No pleural effusion. There is  some dependent atelectasis. 5 mm area of ground-glass in the lingula on series 3, image 16 is unchanged from previous demonstrating long-term stability. No specific imaging  follow-up. Coronary artery calcifications are seen. Please correlate for other coronary risk factors. Hepatobiliary: Mild fatty liver infiltration. No space-occupying liver lesion. Patent portal vein. Gallbladder is present Pancreas: Mild global pancreatic atrophy. No pancreatic mass. Appearance is similar to previous. Spleen: Normal in size without focal abnormality. Adrenals/Urinary Tract: The adrenal glands are preserved. Right kidney has some areas of atrophy. There are at least 4 punctate nonobstructing right-sided renal stones. No right ureteral stone. No abnormal enhancement of the right kidney or collecting system dilatation. Preserved contour to the urinary bladder. The left kidney has some cysts. This includes exophytic from the upper pole measuring 5 cm in diameter with Hounsfield unit of 3. Lateral focus has a diameter of 3.4 cm with Hounsfield unit of 0. No specific imaging follow-up of these benign cystic foci. Nonobstructing upper pole 2-3 mm left-sided renal stone. There is moderate collecting system dilatation the liver along the left kidney down to level of the pelvis where there are multiple stones along the course of the ureter. Least 4 stones are seen. Largest on coronal image 50 of series 5 measures 5 mm. Stomach/Bowel: Large bowel has some diffuse colonic stool. There is an area of wall thickening with stranding along the mid descending colon. An area of colitis is possible. No proximal obstruction. Normal appendix extends inferior to the cecum in the right lower quadrant. Stomach and small bowel are nondilated. No free air or free fluid. Vascular/Lymphatic: Normal caliber aorta and IVC. Scattered vascular calcifications. No specific abnormal lymph node enlargement identified in the abdomen and pelvis. Reproductive: Prostate is unremarkable. Other: Anasarca.  No free air or fluid collections. Musculoskeletal: Diffuse degenerative changes seen of the spine and pelvis. Bridging osteophytes along  the sacroiliac joints. IMPRESSION: Multiple stones along the course of the distal left ureter with moderate left-sided collecting system dilatation. Separate nonobstructing bilateral renal stones. Focal wall thickening with stranding along the mid descending colon. Area of focal colitis is possible. No obstruction, free air or fluid collection. Moderate stool. Recommend follow-up to confirm clearance and exclude secondary pathology. Left-sided Bosniak 1 renal cysts.  No specific imaging follow-up Electronically Signed   By: Karen Kays M.D.   On: 10/17/2022 15:54    Procedures Procedures (including critical care time)  Medications Ordered in UC Medications - No data to display  Initial Impression / Assessment and Plan / UC Course  I have reviewed the triage vital signs and the nursing notes.  Pertinent labs & imaging results that were available during my care of the patient were reviewed by me and considered in my medical decision making (see chart for details).     MDM: 1.  Left lower quadrant abdominal pain-CT abdomen/pelvis with oral/IV contrast revealed above. Patient/wife have been advised of CT abdomen/pelvis with oral/IV contrast results with hardcopy provided.  Advised patient to go to [facility] ED NOW  for further evaluation of distal left ureter with moderate left sided collecting system dilation.  Patient agreed and verbalized understanding of these instructions and this plan of care.  Patient discharged to ED mild, hemodynamically stable. Final Clinical Impressions(s) / UC Diagnoses   Final diagnoses:  Abdominal pain, left lower quadrant  Ureteral calculus, left     Discharge Instructions      Patient/wife have been advised of CT abdomen/pelvis with oral/IV contrast results with hardcopy  provided.  Advised patient to go to [facility] ED NOW  for further evaluation of distal left ureter with moderate left sided collecting system dilation.     ED  Prescriptions   None    PDMP not reviewed this encounter.   Trevor Iha, FNP 10/17/22 [phone]

## 2022-10-24 ENCOUNTER — Other Ambulatory Visit (HOSPITAL_BASED_OUTPATIENT_CLINIC_OR_DEPARTMENT_OTHER): Payer: Self-pay

## 2022-10-24 DIAGNOSIS — N200 Calculus of kidney: Secondary | ICD-10-CM | POA: Diagnosis not present

## 2022-10-24 DIAGNOSIS — N201 Calculus of ureter: Secondary | ICD-10-CM | POA: Diagnosis not present

## 2022-10-25 ENCOUNTER — Ambulatory Visit (INDEPENDENT_AMBULATORY_CARE_PROVIDER_SITE_OTHER): Payer: Medicare HMO

## 2022-10-25 ENCOUNTER — Ambulatory Visit (INDEPENDENT_AMBULATORY_CARE_PROVIDER_SITE_OTHER): Payer: Medicare HMO | Admitting: Sports Medicine

## 2022-10-25 ENCOUNTER — Encounter: Payer: Self-pay | Admitting: Sports Medicine

## 2022-10-25 VITALS — BP 130/70 | HR 84 | Ht 73.0 in | Wt 238.0 lb

## 2022-10-25 DIAGNOSIS — R059 Cough, unspecified: Secondary | ICD-10-CM

## 2022-10-25 DIAGNOSIS — Z794 Long term (current) use of insulin: Secondary | ICD-10-CM

## 2022-10-25 DIAGNOSIS — J1282 Pneumonia due to coronavirus disease 2019: Secondary | ICD-10-CM | POA: Diagnosis not present

## 2022-10-25 DIAGNOSIS — U071 COVID-19: Secondary | ICD-10-CM | POA: Diagnosis not present

## 2022-10-25 DIAGNOSIS — E119 Type 2 diabetes mellitus without complications: Secondary | ICD-10-CM | POA: Diagnosis not present

## 2022-10-25 LAB — POCT GLYCOSYLATED HEMOGLOBIN (HGB A1C): Hemoglobin A1C: 7.3 % — AB (ref 4.0–5.6)

## 2022-10-25 MED ORDER — AZITHROMYCIN 250 MG PO TABS
ORAL_TABLET | ORAL | 0 refills | Status: DC
Start: 2022-10-25 — End: 2022-11-07

## 2022-10-25 NOTE — Assessment & Plan Note (Signed)
History of COVID-pneumonia, diabetes and multiple other medical problems, adding azithromycin as he does have new onset cough, sweats. Return to see me if not better in 5 days

## 2022-10-25 NOTE — Assessment & Plan Note (Signed)
Diabetes under good control, hemoglobin A1c 7.3% with increased to 4.5 Trulicity, he is having some difficulty finding this dose, we will stay tuned.

## 2022-10-25 NOTE — Progress Notes (Signed)
    Procedures performed today:    None.  Independent interpretation of notes and tests performed by another provider:   None.  Brief History, Exam, Impression, and Recommendations:    Diabetes mellitus, type 2 Diabetes under good control, hemoglobin A1c 7.3% with increased to 4.5 Trulicity, he is having some difficulty finding this dose, we will stay tuned.  Pneumonia due to COVID-19 virus History of COVID-pneumonia, diabetes and multiple other medical problems, adding azithromycin as he does have new onset cough, sweats. Return to see me if not better in 5 days    ____________________________________________ Ihor Austin. Benjamin Stain, M.D., ABFM., CAQSM., AME. Primary Care and Sports Medicine [facility]  Adjunct Professor of Family Medicine  [employer]  Restaurant manager, fast food

## 2022-11-07 ENCOUNTER — Ambulatory Visit (INDEPENDENT_AMBULATORY_CARE_PROVIDER_SITE_OTHER): Payer: Medicare HMO | Admitting: Sports Medicine

## 2022-11-07 DIAGNOSIS — Z87891 Personal history of nicotine dependence: Secondary | ICD-10-CM

## 2022-11-07 DIAGNOSIS — Z0000 Encounter for general adult medical examination without abnormal findings: Secondary | ICD-10-CM | POA: Diagnosis not present

## 2022-11-07 NOTE — Progress Notes (Signed)
MEDICARE ANNUAL WELLNESS VISIT  11/07/2022  Telephone Visit Disclaimer This Medicare AWV was conducted by telephone due to national recommendations for restrictions regarding the COVID-19 Pandemic (e.g. social distancing).  I verified, using two identifiers, that I am speaking with Robert Hines or their authorized healthcare agent. I discussed the limitations, risks, security, and privacy concerns of performing an evaluation and management service by telephone and the potential availability of an in-person appointment in the future. The patient expressed understanding and agreed to proceed.  Location of Patient: Home Location of Provider (nurse):  In the office.  Subjective:    Robert Hines is a 67 y.o. male patient of Thekkekandam, Ihor Austin, MD who had a Medicare Annual Wellness Visit today via telephone. Robert Hines is Retired and lives with their spouse. he has 2 children. he reports that he is socially active and does interact with friends/family regularly. he is minimally physically active and enjoys being outdoors.  Patient Care Team: Monica Becton, MD as PCP - General (Family Medicine)     11/07/2022   11:42 AM 11/01/2021    3:11 PM 08/14/2020   11:26 AM 04/04/2019   11:04 AM 03/21/2019    8:04 AM 08/25/2017    1:52 PM 01/12/2016   11:13 AM  Advanced Directives  Does Patient Have a Medical Advance Directive? No No No No No No No  Would patient like information on creating a medical advance directive? No - Patient declined No - Patient declined No - Patient declined No - Patient declined No - Patient declined No - Patient declined No - patient declined information    Hospital Utilization Over the Past 12 Months: # of hospitalizations or ER visits: 2 # of surgeries: 1  Review of Systems    Patient reports that his overall health is unchanged compared to last year.  History obtained from chart review and the patient  Patient Reported Readings (BP, Pulse, CBG,  Weight, etc) none  Pain Assessment Pain : No/denies pain     Current Medications & Allergies (verified) Allergies as of 11/07/2022       Reactions   Augmentin [amoxicillin-pot Clavulanate] Diarrhea   Codeine Diarrhea, Nausea Only   Duloxetine Diarrhea, Nausea Only, Rash        Medication List        Accurate as of Nov 07, 2022 11:59 AM. If you have any questions, ask your nurse or doctor.          STOP taking these medications    azithromycin 250 MG tablet Commonly known as: Zithromax Z-Pak       TAKE these medications    aspirin 81 MG chewable tablet Chew by mouth daily.   Basaglar KwikPen 100 UNIT/ML INJECT 94 UNITS INTO THE SKIN EVERY NIGHT AT BEDTIME   buPROPion 300 MG 24 hr tablet Commonly known as: WELLBUTRIN XL Take 1 tablet (300 mg total) by mouth daily.   colestipol 1 g tablet Commonly known as: COLESTID Take 1 g by mouth daily.   gabapentin 300 MG capsule Commonly known as: NEURONTIN TAKE 3 CAPSULES IN THE MORNING AND 4 CAPSULES IN THE EVENING   glipiZIDE 5 MG tablet Commonly known as: GLUCOTROL Take 1 tablet (5 mg total) by mouth daily before breakfast.   HYDROcodone-acetaminophen 5-325 MG tablet Commonly known as: NORCO/VICODIN Take by mouth.   Insulin Pen Needle 32G X 4 MM Misc Use as needed with toujeo pen   ketorolac 10 MG tablet Commonly known as: TORADOL  Take 1 tablet (10 mg total) by mouth every 8 (eight) hours as needed.   lisinopril 40 MG tablet Commonly known as: ZESTRIL Take 1 tablet (40 mg total) by mouth daily.   metFORMIN 1000 MG tablet Commonly known as: GLUCOPHAGE Take 1 tablet (1,000 mg total) by mouth 2 (two) times daily with a meal.   PROBIOTIC-10 PO Take by mouth.   simvastatin 40 MG tablet Commonly known as: ZOCOR Take 1 tablet (40 mg total) by mouth daily.   tamsulosin 0.4 MG Caps capsule Commonly known as: FLOMAX Take 0.4 mg by mouth daily.   Trulicity 4.5 MG/0.5ML Sopn Generic drug:  Dulaglutide Inject 4.5 mg into the skin once a week.   zolpidem 10 MG tablet Commonly known as: AMBIEN TAKE ONE TABLET BY MOUTH EVERY NIGHT AT BEDTIME AS NEEDED FOR SLEEP        History (reviewed): Past Medical History:  Diagnosis Date   Arthritis    neck   COVID-19    Depression    Diabetes mellitus without complication (HCC)    type 2   Hyperlipidemia    Hypertension    Pneumonia    covid pneumonia and covid july 2020, d/ced july 16th from [facility] stayed 9 days   Renal disorder    calculi, multiple lithotripsies, stents   Right cataract    Rotator cuff disorder    Spinal pain    Past Surgical History:  Procedure Laterality Date   ANTERIOR FUSION CERVICAL SPINE  2007   ARTHOSCOPIC ROTAOR CUFF REPAIR Left 03/21/2019   Procedure: ARTHROSCOPIC ROTATOR CUFF REPAIR;  Surgeon: Jones Broom, MD;  Location: [facility];  Service: Orthopedics;  Laterality: Left;   CYSTOSCOPY/RETROGRADE/URETEROSCOPY/STONE EXTRACTION WITH BASKET  08/2021   EYE SURGERY Left    cataract   FOOT SURGERY Right    pin placed and removed   HERNIA REPAIR     inguinal   LITHOTRIPSY     several   SHOULDER ARTHROSCOPY WITH ROTATOR CUFF REPAIR AND SUBACROMIAL DECOMPRESSION Right 12/28/2015   Procedure: SHOULDER ARTHROSCOPY TAKE DOWN ROTATOR CUFF REPAIR AND SUBACROMIAL DECOMPRESSION, DEBRIDEMENT OF SLAP TEAR;  Surgeon: Jones Broom, MD;  Location:  SURGERY CENTER;  Service: Orthopedics;  Laterality: Right;  SHOULDER ARTHROSCOPY TAKE DOWN ROTATOR CUFF REPAIR AND SUBACROMIAL DECOMPRESSION   SHOULDER ARTHROSCOPY WITH SUBACROMIAL DECOMPRESSION Left 03/21/2019   Procedure: SHOULDER ARTHROSCOPY WITH SUBACROMIAL DECOMPRESSION;  Surgeon: Jones Broom, MD;  Location: [facility] ;  Service: Orthopedics;  Laterality: Left;   VASECTOMY Bilateral    Family History  Problem Relation Age of Onset   Hypertension Mother    Heart failure Father    Diabetes  Father    Hypertension Father    Heart failure Sister    Hypertension Sister    Social History   Socioeconomic History   Marital status: Married    Spouse name: Meriam Sprague Dewayne Hatch)   Number of children: 2   Years of education: 12th grade   Highest education level: High school graduate  Occupational History   Occupation: Retired  Tobacco Use   Smoking status: Former    Packs/day: 1.00    Years: 20.00    Additional pack years: 0.00    Total pack years: 20.00    Types: Cigarettes    Quit date: 10/01/2018    Years since quitting: 4.1   Smokeless tobacco: Never  Vaping Use   Vaping Use: Never used  Substance and Sexual Activity   Alcohol use: Yes  Alcohol/week: 0.0 standard drinks of alcohol    Comment: social   Drug use: No   Sexual activity: Yes    Birth control/protection: None  Other Topics Concern   Not on file  Social History Narrative   Lives with his wife. He has recently retired and is working on finding new hobbies. He loves to do things outdoors such as gardening or fishing.    Social Determinants of Health   Financial Resource Strain: Low Risk  (11/07/2022)   Overall Financial Resource Strain (CARDIA)    Difficulty of Paying Living Expenses: Not hard at all  Food Insecurity: No Food Insecurity (11/07/2022)   Hunger Vital Sign    Worried About Running Out of Food in the Last Year: Never true    Ran Out of Food in the Last Year: Never true  Transportation Needs: No Transportation Needs (11/07/2022)   PRAPARE - Administrator, Civil Service (Medical): No    Lack of Transportation (Non-Medical): No  Physical Activity: Inactive (11/07/2022)   Exercise Vital Sign    Days of Exercise per Week: 0 days    Minutes of Exercise per Session: 0 min  Stress: No Stress Concern Present (11/07/2022)   [employer] of Occupational Health - Occupational Stress Questionnaire    Feeling of Stress : Not at all  Social Connections: Moderately Isolated (11/07/2022)    Social Connection and Isolation Panel [NHANES]    Frequency of Communication with Friends and Family: More than three times a week    Frequency of Social Gatherings with Friends and Family: More than three times a week    Attends Religious Services: Never    Database administrator or Organizations: No    Attends Banker Meetings: Never    Marital Status: Married    Activities of Daily Living    11/07/2022   11:46 AM  In your present state of health, do you have any difficulty performing the following activities:  Hearing? 1  Comment some hearing loss; wears hearing aids  Vision? 0  Difficulty concentrating or making decisions? 1  Comment sometimes on concentration  Walking or climbing stairs? 1  Comment back pain  Dressing or bathing? 0  Doing errands, shopping? 0  Preparing Food and eating ? N  Using the Toilet? N  In the past six months, have you accidently leaked urine? N  Do you have problems with loss of bowel control? N  Managing your Medications? Y  Comment his wife helps him  Managing your Finances? N  Housekeeping or managing your Housekeeping? N    Patient Education/ Literacy How often do you need to have someone help you when you read instructions, pamphlets, or other written materials from your doctor or pharmacy?: 1 - Never What is the last grade level you completed in school?: 12th grade  Exercise Current Exercise Habits: The patient does not participate in regular exercise at present, Exercise limited by: orthopedic condition(s)  Diet Patient reports consuming 3 meals a day and 2 snack(s) a day Patient reports that his primary diet is: Regular Patient reports that she does have regular access to food.   Depression Screen    11/07/2022   11:42 AM 10/25/2022   10:57 AM 04/05/2022    9:35 AM 11/01/2021    3:02 PM 08/14/2020   11:30 AM 08/07/2020    2:22 PM 02/12/2020    5:02 PM  PHQ 2/9 Scores  PHQ - 2 Score 0 0 0 0  0 0 0  PHQ- 9 Score  0 0 0  0 0 0     Fall Risk    11/07/2022   11:42 AM 04/05/2022    9:15 AM 11/01/2021    3:02 PM 08/14/2020   11:30 AM 08/07/2020    2:22 PM  Fall Risk   Falls in the past year? 0 0 0 0 0  Number falls in past yr: 0 0 0 0 0  Injury with Fall? 0 0 0 0 0  Risk for fall due to : No Fall Risks  No Fall Risks No Fall Risks   Follow up Falls evaluation completed  Falls evaluation completed Falls evaluation completed      Objective:  JYDEN BABULA seemed alert and oriented and he participated appropriately during our telephone visit.  Blood Pressure Weight BMI  BP Readings from Last 3 Encounters:  10/25/22 130/70  10/17/22 (!) 143/82  07/06/22 (!) 149/81   Wt Readings from Last 3 Encounters:  10/25/22 238 lb (108 kg)  10/17/22 240 lb (108.9 kg)  07/06/22 244 lb (110.7 kg)   BMI Readings from Last 1 Encounters:  10/25/22 31.40 kg/m    *Unable to obtain current vital signs, weight, and BMI due to telephone visit type  Hearing/Vision  Kajon did not seem to have difficulty with hearing/understanding during the telephone conversation Reports that he has not had a formal eye exam by an eye care professional within the past year Reports that he has not had a formal hearing evaluation within the past year *Unable to fully assess hearing and vision during telephone visit type  Cognitive Function:    11/07/2022   11:49 AM 11/01/2021    3:16 PM 08/14/2020   11:34 AM  6CIT Screen  What Year? 0 points 0 points 0 points  What month? 0 points 0 points 0 points  What time? 0 points 0 points 0 points  Count back from 20 0 points 0 points 0 points  Months in reverse 0 points 2 points 0 points  Repeat phrase 0 points 0 points 2 points  Total Score 0 points 2 points 2 points   (Normal:0-7, Significant for Dysfunction: >8)  Normal Cognitive Function Screening: Yes   Immunization & Health Maintenance Record Immunization History  Administered Date(s) Administered   Fluad Quad(high Dose 65+)  04/02/2019, 04/05/2022   Influenza-Unspecified 04/03/2014, 03/31/2015, 03/28/2016, 03/12/2020, 04/26/2021   PFIZER(Purple Top)SARS-COV-2 Vaccination 08/02/2019, 08/28/2019   Pneumococcal Conjugate-13 08/07/2020   Pneumococcal Polysaccharide-23 01/23/2015, 04/02/2019   Td 05/21/2008   Tdap 05/24/2018   Zoster Recombinat (Shingrix) 11/11/2020, 12/30/2020   Zoster, Live 01/23/2015    Health Maintenance  Topic Date Due   OPHTHALMOLOGY EXAM  11/07/2022 (Originally 09/24/2022)   FOOT EXAM  11/14/2022 (Originally 08/07/2021)   COVID-19 Vaccine (3 - Pfizer risk series) 11/23/2022 (Originally 09/25/2019)   INFLUENZA VACCINE  01/19/2023   Diabetic kidney evaluation - eGFR measurement  04/06/2023   HEMOGLOBIN A1C  04/27/2023   Diabetic kidney evaluation - Urine ACR  07/07/2023   Medicare Annual Wellness (AWV)  11/07/2023   COLONOSCOPY (Pts 45-58yrs Insurance coverage will need to be confirmed)  03/08/2025   DTaP/Tdap/Td (3 - Td or Tdap) 05/24/2028   Pneumonia Vaccine 51+ Years old  Completed   Hepatitis C Screening  Completed   Zoster Vaccines- Shingrix  Completed   HPV VACCINES  Aged Out       Assessment  This is a routine wellness examination for [employer].  Health  Maintenance: Due or Overdue There are no preventive care reminders to display for this patient.   Robert Hines does not need a referral for Community Assistance: Care Management:   no Social Work:    no Prescription Assistance:  no Nutrition/Diabetes Education:  no   Plan:  Personalized Goals  Goals Addressed               This Visit's Progress     Patient Stated (pt-stated)        Patient would like to have his back pain resolved.        Personalized Health Maintenance & Screening Recommendations  Diabetic eye exam  Lung Cancer Screening Recommended: yes; referral placed (Low Dose CT Chest recommended if Age 28-80 years, 20 pack-year currently smoking OR have quit w/in past 15 years) Hepatitis  C Screening recommended: no HIV Screening recommended: no  Advanced Directives: Written information was not prepared per patient's request.  Referrals & Orders Orders Placed This Encounter  Procedures   Ambulatory Referral for Lung Cancer Scre    Follow-up Plan Follow-up with Monica Becton, MD as planned Schedule diabetic eye exam. Medicare wellness visit in one year.  Patient will access AVS on my chart.   I have personally reviewed and noted the following in the patient's chart:   Medical and social history Use of alcohol, tobacco or illicit drugs  Current medications and supplements Functional ability and status Nutritional status Physical activity Advanced directives List of other physicians Hospitalizations, surgeries, and ER visits in previous 12 months Vitals Screenings to include cognitive, depression, and falls Referrals and appointments  In addition, I have reviewed and discussed with Robert Hines certain preventive protocols, quality metrics, and best practice recommendations. A written personalized care plan for preventive services as well as general preventive health recommendations is available and can be mailed to the patient at his request.      Modesto Charon, RN BSN  11/07/2022

## 2022-11-07 NOTE — Patient Instructions (Addendum)
MEDICARE ANNUAL WELLNESS VISIT Health Maintenance Summary and Written Plan of Care  [name] ,  Thank you for allowing me to perform your Medicare Annual Wellness Visit and for your ongoing commitment to your health.   Health Maintenance & Immunization History Health Maintenance  Topic Date Due   OPHTHALMOLOGY EXAM  11/07/2022 (Originally 09/24/2022)   FOOT EXAM  11/14/2022 (Originally 08/07/2021)   COVID-19 Vaccine (3 - Pfizer risk series) 11/23/2022 (Originally 09/25/2019)   INFLUENZA VACCINE  01/19/2023   Diabetic kidney evaluation - eGFR measurement  04/06/2023   HEMOGLOBIN A1C  04/27/2023   Diabetic kidney evaluation - Urine ACR  07/07/2023   Medicare Annual Wellness (AWV)  11/07/2023   COLONOSCOPY (Pts 45-74yrs Insurance coverage will need to be confirmed)  03/08/2025   DTaP/Tdap/Td (3 - Td or Tdap) 05/24/2028   Pneumonia Vaccine 9+ Years old  Completed   Hepatitis C Screening  Completed   Zoster Vaccines- Shingrix  Completed   HPV VACCINES  Aged Out   Immunization History  Administered Date(s) Administered   Fluad Quad(high Dose 65+) 04/02/2019, 04/05/2022   Influenza-Unspecified 04/03/2014, 03/31/2015, 03/28/2016, 03/12/2020, 04/26/2021   PFIZER(Purple Top)SARS-COV-2 Vaccination 08/02/2019, 08/28/2019   Pneumococcal Conjugate-13 08/07/2020   Pneumococcal Polysaccharide-23 01/23/2015, 04/02/2019   Td 05/21/2008   Tdap 05/24/2018   Zoster Recombinat (Shingrix) 11/11/2020, 12/30/2020   Zoster, Live 01/23/2015    These are the patient goals that we discussed:  Goals Addressed               This Visit's Progress     Patient Stated (pt-stated)        Patient would like to have his back pain resolved.          This is a list of Health Maintenance Items that are overdue or due now: Diabetic eye exam    Orders/Referrals Placed Today: Orders Placed This Encounter  Procedures   Ambulatory Referral for Lung Cancer Scre    Referral Priority:   Routine     Referral Type:   Consultation    Referral Reason:   Specialty Services Required    Number of Visits Requested:   1   (Contact our referral department at [phone] if you have not spoken with someone about your referral appointment within the next 5 days)    Follow-up Plan Follow-up with Monica Becton, MD as planned Schedule diabetic eye exam. Medicare wellness visit in one year.  Patient will access AVS on my chart.      Health Maintenance, Male Adopting a healthy lifestyle and getting preventive care are important in promoting health and wellness. Ask your health care provider about: The right schedule for you to have regular tests and exams. Things you can do on your own to prevent diseases and keep yourself healthy. What should I know about diet, weight, and exercise? Eat a healthy diet  Eat a diet that includes plenty of vegetables, fruits, low-fat dairy products, and lean protein. Do not eat a lot of foods that are high in solid fats, added sugars, or sodium. Maintain a healthy weight Body mass index (BMI) is a measurement that can be used to identify possible weight problems. It estimates body fat based on height and weight. Your health care provider can help determine your BMI and help you achieve or maintain a healthy weight. Get regular exercise Get regular exercise. This is one of the most important things you can do for your health. Most adults should: Exercise for at least 150  minutes each week. The exercise should increase your heart rate and make you sweat (moderate-intensity exercise). Do strengthening exercises at least twice a week. This is in addition to the moderate-intensity exercise. Spend less time sitting. Even light physical activity can be beneficial. Watch cholesterol and blood lipids Have your blood tested for lipids and cholesterol at 69 years of age, then have this test every 5 years. You may need to have your cholesterol levels checked more  often if: Your lipid or cholesterol levels are high. You are older than 69 years of age. You are at high risk for heart disease. What should I know about cancer screening? Many types of cancers can be detected early and may often be prevented. Depending on your health history and family history, you may need to have cancer screening at various ages. This may include screening for: Colorectal cancer. Prostate cancer. Skin cancer. Lung cancer. What should I know about heart disease, diabetes, and high blood pressure? Blood pressure and heart disease High blood pressure causes heart disease and increases the risk of stroke. This is more likely to develop in people who have high blood pressure readings or are overweight. Talk with your health care provider about your target blood pressure readings. Have your blood pressure checked: Every 3-5 years if you are 49-54 years of age. Every year if you are 13 years old or older. If you are between the ages of 24 and 68 and are a current or former smoker, ask your health care provider if you should have a one-time screening for abdominal aortic aneurysm (AAA). Diabetes Have regular diabetes screenings. This checks your fasting blood sugar level. Have the screening done: Once every three years after age 67 if you are at a normal weight and have a low risk for diabetes. More often and at a younger age if you are overweight or have a high risk for diabetes. What should I know about preventing infection? Hepatitis B If you have a higher risk for hepatitis B, you should be screened for this virus. Talk with your health care provider to find out if you are at risk for hepatitis B infection. Hepatitis C Blood testing is recommended for: Everyone born from [birth year] through 1967. Anyone with known risk factors for hepatitis C. Sexually transmitted infections (STIs) You should be screened each year for STIs, including gonorrhea and chlamydia, if: You are  sexually active and are younger than 69 years of age. You are older than 69 years of age and your health care provider tells you that you are at risk for this type of infection. Your sexual activity has changed since you were last screened, and you are at increased risk for chlamydia or gonorrhea. Ask your health care provider if you are at risk. Ask your health care provider about whether you are at high risk for HIV. Your health care provider may recommend a prescription medicine to help prevent HIV infection. If you choose to take medicine to prevent HIV, you should first get tested for HIV. You should then be tested every 3 months for as long as you are taking the medicine. Follow these instructions at home: Alcohol use Do not drink alcohol if your health care provider tells you not to drink. If you drink alcohol: Limit how much you have to 0-2 drinks a day. Know how much alcohol is in your drink. In the [place] one drink equals one 12 oz bottle of beer (355 mL), one 5 oz glass of wine (  148 mL), or one 1 oz glass of hard liquor (44 mL). Lifestyle Do not use any products that contain nicotine or tobacco. These products include cigarettes, chewing tobacco, and vaping devices, such as e-cigarettes. If you need help quitting, ask your health care provider. Do not use street drugs. Do not share needles. Ask your health care provider for help if you need support or information about quitting drugs. General instructions Schedule regular health, dental, and eye exams. Stay current with your vaccines. Tell your health care provider if: You often feel depressed. You have ever been abused or do not feel safe at home. Summary Adopting a healthy lifestyle and getting preventive care are important in promoting health and wellness. Follow your health care provider's instructions about healthy diet, exercising, and getting tested or screened for diseases. Follow your health care provider's instructions on  monitoring your cholesterol and blood pressure. This information is not intended to replace advice given to you by your health care provider. Make sure you discuss any questions you have with your health care provider. Document Revised: 10/26/2020 Document Reviewed: 10/26/2020 Elsevier Patient Education  2023 [employer].

## 2022-11-08 DIAGNOSIS — M47816 Spondylosis without myelopathy or radiculopathy, lumbar region: Secondary | ICD-10-CM | POA: Diagnosis not present

## 2022-11-18 ENCOUNTER — Other Ambulatory Visit (HOSPITAL_BASED_OUTPATIENT_CLINIC_OR_DEPARTMENT_OTHER): Payer: Self-pay

## 2022-11-22 ENCOUNTER — Other Ambulatory Visit: Payer: Self-pay

## 2022-11-24 ENCOUNTER — Telehealth: Payer: Self-pay | Admitting: Sports Medicine

## 2022-11-24 NOTE — Telephone Encounter (Signed)
I do not have an answer for them other than going to insulin until they can get Trulicity.

## 2022-11-24 NOTE — Telephone Encounter (Signed)
Patient spouse Meriam Sprague) states there is a Sport and exercise psychologist of Trulicity 4.5mg . Ms. Meriam Sprague states she has called multiple pharmacies and has not been advised when meds will be in stock. Please advise.

## 2022-11-26 ENCOUNTER — Other Ambulatory Visit (HOSPITAL_BASED_OUTPATIENT_CLINIC_OR_DEPARTMENT_OTHER): Payer: Self-pay

## 2022-11-29 ENCOUNTER — Other Ambulatory Visit (HOSPITAL_BASED_OUTPATIENT_CLINIC_OR_DEPARTMENT_OTHER): Payer: Self-pay

## 2022-11-29 ENCOUNTER — Encounter (INDEPENDENT_AMBULATORY_CARE_PROVIDER_SITE_OTHER): Payer: Medicare HMO | Admitting: Sports Medicine

## 2022-11-29 DIAGNOSIS — Z794 Long term (current) use of insulin: Secondary | ICD-10-CM

## 2022-11-29 DIAGNOSIS — E119 Type 2 diabetes mellitus without complications: Secondary | ICD-10-CM

## 2022-11-29 MED ORDER — VICTOZA 18 MG/3ML ~~LOC~~ SOPN
PEN_INJECTOR | SUBCUTANEOUS | 3 refills | Status: DC
Start: 2022-11-29 — End: 2022-12-02

## 2022-11-29 NOTE — Assessment & Plan Note (Signed)
Historically under good control with an A1c of 7.3%, we bumped up his Trulicity, now due to the national shortage she cannot get Trulicity, continue Lantus, we will going to try Victoza.

## 2022-11-29 NOTE — Telephone Encounter (Signed)
I spent 5 total minutes of online digital evaluation and management services in this patient-initiated request for online care. 

## 2022-12-02 MED ORDER — OZEMPIC (2 MG/DOSE) 8 MG/3ML ~~LOC~~ SOPN
2.0000 mg | PEN_INJECTOR | SUBCUTANEOUS | 11 refills | Status: DC
Start: 2022-12-02 — End: 2023-11-24

## 2022-12-02 NOTE — Addendum Note (Signed)
Addended by: Monica Becton on: 12/02/2022 09:14 PM   Modules accepted: Orders

## 2022-12-03 DIAGNOSIS — L84 Corns and callosities: Secondary | ICD-10-CM | POA: Diagnosis not present

## 2022-12-03 DIAGNOSIS — B351 Tinea unguium: Secondary | ICD-10-CM | POA: Diagnosis not present

## 2022-12-03 DIAGNOSIS — M2041 Other hammer toe(s) (acquired), right foot: Secondary | ICD-10-CM | POA: Diagnosis not present

## 2022-12-03 DIAGNOSIS — G609 Hereditary and idiopathic neuropathy, unspecified: Secondary | ICD-10-CM | POA: Diagnosis not present

## 2022-12-03 DIAGNOSIS — E0840 Diabetes mellitus due to underlying condition with diabetic neuropathy, unspecified: Secondary | ICD-10-CM | POA: Diagnosis not present

## 2022-12-03 DIAGNOSIS — M79675 Pain in left toe(s): Secondary | ICD-10-CM | POA: Diagnosis not present

## 2022-12-08 ENCOUNTER — Telehealth: Payer: Self-pay

## 2022-12-08 NOTE — Telephone Encounter (Signed)
Robert Hines with [facility] [phone] Called  States patient was previously on Trulicity 4.5 mg  But has been out of medication for about 2 weeks  Dr. Benjamin Stain prescribed  Ozempic 2mg   He was wanting to let Dr. Karie Schwalbe know that patient has not taken any mediation for 2 weeks and this could cause some bad side effects if strength is too strong.  He recommended trying 1mg  first.  Should he fill the 2mg  or 1mg  for patient?

## 2022-12-08 NOTE — Telephone Encounter (Signed)
Spoke to his wife today, I am okay with the 2 mg formulation.  If he gets excessive nausea then he can let me know.  The half-life of Trulicity is so long he likely has a good amount left in his body.

## 2022-12-08 NOTE — Telephone Encounter (Signed)
Robert Hines with Robert Hines informed.

## 2023-01-02 ENCOUNTER — Other Ambulatory Visit: Payer: Self-pay | Admitting: Sports Medicine

## 2023-01-04 ENCOUNTER — Ambulatory Visit: Payer: Medicare HMO | Admitting: Sports Medicine

## 2023-01-05 DIAGNOSIS — M5136 Other intervertebral disc degeneration, lumbar region: Secondary | ICD-10-CM | POA: Diagnosis not present

## 2023-01-05 DIAGNOSIS — M47816 Spondylosis without myelopathy or radiculopathy, lumbar region: Secondary | ICD-10-CM | POA: Diagnosis not present

## 2023-01-18 ENCOUNTER — Other Ambulatory Visit: Payer: Self-pay | Admitting: Sports Medicine

## 2023-01-18 DIAGNOSIS — E119 Type 2 diabetes mellitus without complications: Secondary | ICD-10-CM | POA: Diagnosis not present

## 2023-01-18 DIAGNOSIS — F32A Depression, unspecified: Secondary | ICD-10-CM

## 2023-01-18 DIAGNOSIS — Z961 Presence of intraocular lens: Secondary | ICD-10-CM | POA: Diagnosis not present

## 2023-01-18 DIAGNOSIS — Z135 Encounter for screening for eye and ear disorders: Secondary | ICD-10-CM | POA: Diagnosis not present

## 2023-01-18 DIAGNOSIS — H524 Presbyopia: Secondary | ICD-10-CM | POA: Diagnosis not present

## 2023-01-18 LAB — HM DIABETES EYE EXAM

## 2023-01-25 ENCOUNTER — Other Ambulatory Visit: Payer: Self-pay | Admitting: Emergency Medicine

## 2023-01-25 DIAGNOSIS — Z87891 Personal history of nicotine dependence: Secondary | ICD-10-CM

## 2023-01-25 DIAGNOSIS — Z122 Encounter for screening for malignant neoplasm of respiratory organs: Secondary | ICD-10-CM

## 2023-01-28 ENCOUNTER — Other Ambulatory Visit: Payer: Self-pay | Admitting: Sports Medicine

## 2023-01-28 DIAGNOSIS — Z794 Long term (current) use of insulin: Secondary | ICD-10-CM

## 2023-02-02 ENCOUNTER — Encounter: Payer: Self-pay | Admitting: Sports Medicine

## 2023-02-13 DIAGNOSIS — G609 Hereditary and idiopathic neuropathy, unspecified: Secondary | ICD-10-CM | POA: Diagnosis not present

## 2023-02-13 DIAGNOSIS — B351 Tinea unguium: Secondary | ICD-10-CM | POA: Diagnosis not present

## 2023-02-13 DIAGNOSIS — M79675 Pain in left toe(s): Secondary | ICD-10-CM | POA: Diagnosis not present

## 2023-02-13 DIAGNOSIS — L84 Corns and callosities: Secondary | ICD-10-CM | POA: Diagnosis not present

## 2023-02-13 DIAGNOSIS — E0840 Diabetes mellitus due to underlying condition with diabetic neuropathy, unspecified: Secondary | ICD-10-CM | POA: Diagnosis not present

## 2023-02-13 DIAGNOSIS — M2041 Other hammer toe(s) (acquired), right foot: Secondary | ICD-10-CM | POA: Diagnosis not present

## 2023-03-01 ENCOUNTER — Ambulatory Visit
Admission: RE | Admit: 2023-03-01 | Discharge: 2023-03-01 | Disposition: A | Discharge status: Home / Self Care | Payer: Medicare HMO | Source: Ambulatory Visit | Attending: Acute Care | Admitting: Acute Care

## 2023-03-01 ENCOUNTER — Ambulatory Visit (INDEPENDENT_AMBULATORY_CARE_PROVIDER_SITE_OTHER): Payer: Medicare HMO | Admitting: Physician Assistant

## 2023-03-01 ENCOUNTER — Encounter: Payer: Self-pay | Admitting: Physician Assistant

## 2023-03-01 DIAGNOSIS — Z87891 Personal history of nicotine dependence: Secondary | ICD-10-CM

## 2023-03-01 DIAGNOSIS — Z122 Encounter for screening for malignant neoplasm of respiratory organs: Secondary | ICD-10-CM

## 2023-03-01 NOTE — Patient Instructions (Signed)

## 2023-03-01 NOTE — Progress Notes (Signed)
Virtual Visit via Telephone Note  I connected with Robert Hines on 03/01/23 at 11:18 AM by telephone and verified that I am speaking with the correct person using two identifiers.  Location: Patient: home Provider: working virtually from home   I discussed the limitations, risks, security and privacy concerns of performing an evaluation and management service by telephone and the availability of in person appointments. I also discussed with the patient that there may be a patient responsible charge related to this service. The patient expressed understanding and agreed to proceed.       Shared Decision Making Visit Lung Cancer Screening Program [phone])   Eligibility: Age 43 Pack Years Smoking History Calculation 24 (# packs/per year x # years smoked) Recent History of coughing up blood  no Unexplained weight loss? No ( >Than 15 pounds within the last 6 months ) Prior History Lung / other cancer No (Diagnosis within the last 5 years already requiring surveillance chest CT Scans). Smoking Status Former Smoker Former Smokers: Years since quit: 4 years  Quit Date: 2020  Visit Components: Discussion included one or more decision making aids? Yes Discussion included risk/benefits of screening. Yes Discussion included potential follow up diagnostic testing for abnormal scans. Yes Discussion included meaning and risk of over diagnosis. Yes Discussion included meaning and risk of False Positives. Yes Discussion included meaning of total radiation exposure. Yes  Counseling Included: Importance of adherence to annual lung cancer LDCT screening. Yes Impact of comorbidities on ability to participate in the program. Yes Ability and willingness to under diagnostic treatment. Yes  Smoking Cessation Counseling: Former Smokers:  Discussed the importance of maintaining cigarette abstinence. Yes Diagnosis Code: Personal History of Nicotine Dependence. I69.629 Information about  tobacco cessation classes and interventions provided to patient. Yes Written Order for Lung Cancer Screening with LDCT placed in Epic. Yes (CT Chest Lung Cancer Screening Low Dose W/O CM) [id number] Z12.2-Screening of respiratory organs Z87.891-Personal history of nicotine dependence    I spent 25 minutes of face to face time/virtual visit time  with the patient discussing the risks and benefits of lung cancer screening. We took the time to pause at intervals to allow for questions to be asked and answered to ensure understanding. We discussed that they had taken the single most powerful action possible to decrease their risk of developing lung cancer when they quit smoking. I counseled them to remain smoke free, and to contact the office if they ever had the desire to smoke again so that we can provide resources and tools to help support the effort to remain smoke free. We discussed the time and location of the scan, and they  will receive a call or letter with the results within  24-72 hours of receiving them. They have the office contact information in the event they have questions.   They verbalized understanding of all of the above and had no further questions.    I explained to the patient that there has been a high incidence of coronary artery disease noted on these exams. I explained that this is a non-gated exam therefore degree or severity cannot be determined. This patient is on statin therapy. I have asked the patient to follow-up with their PCP regarding any incidental finding of coronary artery disease and management with diet or medication as they feel is clinically indicated. The patient verbalized understanding of the above and had no further questions.      Darcella Gasman Keileigh Vahey, PA-C

## 2023-03-16 ENCOUNTER — Other Ambulatory Visit: Payer: Self-pay

## 2023-03-16 DIAGNOSIS — Z122 Encounter for screening for malignant neoplasm of respiratory organs: Secondary | ICD-10-CM

## 2023-03-16 DIAGNOSIS — Z87891 Personal history of nicotine dependence: Secondary | ICD-10-CM

## 2023-04-11 DIAGNOSIS — H903 Sensorineural hearing loss, bilateral: Secondary | ICD-10-CM | POA: Diagnosis not present

## 2023-04-21 ENCOUNTER — Telehealth: Payer: Self-pay | Admitting: Sports Medicine

## 2023-04-21 MED ORDER — GABAPENTIN 300 MG PO CAPS: ORAL_CAPSULE | ORAL | 3 refills | Status: DC

## 2023-04-21 NOTE — Telephone Encounter (Signed)
Uncontrolled substance refill requests do not need MD skill, they can be sent to clinical support staff.    I'll do the refill this time.

## 2023-04-21 NOTE — Telephone Encounter (Signed)
[facility] rep called to request refills on Gabapentin 300mg   Please submit [facility] Phone number [phone] Reference number [phone]

## 2023-04-24 ENCOUNTER — Ambulatory Visit (INDEPENDENT_AMBULATORY_CARE_PROVIDER_SITE_OTHER): Payer: Medicare HMO | Admitting: Sports Medicine

## 2023-04-24 ENCOUNTER — Encounter: Payer: Self-pay | Admitting: Sports Medicine

## 2023-04-24 VITALS — BP 138/83 | HR 80 | Ht 73.0 in | Wt 236.0 lb

## 2023-04-24 DIAGNOSIS — E119 Type 2 diabetes mellitus without complications: Secondary | ICD-10-CM

## 2023-04-24 DIAGNOSIS — M47816 Spondylosis without myelopathy or radiculopathy, lumbar region: Secondary | ICD-10-CM | POA: Diagnosis not present

## 2023-04-24 DIAGNOSIS — Z794 Long term (current) use of insulin: Secondary | ICD-10-CM

## 2023-04-24 DIAGNOSIS — I10 Essential (primary) hypertension: Secondary | ICD-10-CM

## 2023-04-24 LAB — POCT GLYCOSYLATED HEMOGLOBIN (HGB A1C): Hemoglobin A1C: 6.9 % — AB (ref 4.0–5.6)

## 2023-04-24 NOTE — Assessment & Plan Note (Signed)
Currently on Ozempic, 2 mg, this is better covered than the Trulicity, A1c is well-controlled today. No change in plan.

## 2023-04-24 NOTE — Addendum Note (Signed)
Addended by: Carren Rang A on: 04/24/2023 03:11 PM   Modules accepted: Orders

## 2023-04-24 NOTE — Progress Notes (Signed)
    Procedures performed today:    None.  Independent interpretation of notes and tests performed by another provider:   None.  Brief History, Exam, Impression, and Recommendations:    Diabetes mellitus, type 2 Currently on Ozempic, 2 mg, this is better covered than the Trulicity, A1c is well-controlled today. No change in plan.  Essential hypertension, benign Initially elevated but much better on recheck.  Lumbar spondylosis Robert Hines has chronic axial low back pain, he has been treated longitudinally, mostly left-sided axial, worse with walking, better with rest and sitting. He has had several epidurals, he has had facet injections and radiofrequency ablation, he has seen Dr. Alden Hipp who suggested nothing was operable. I am going to go and set him up with a referral to pain management, I would also like a second opinion from a different neurosurgeon.    ____________________________________________ Robert Hines. Robert Hines, M.D., ABFM., CAQSM., AME. Primary Care and Sports Medicine [facility]  Adjunct Professor of Family Medicine  [employer]  Restaurant manager, fast food

## 2023-04-24 NOTE — Assessment & Plan Note (Signed)
Robert Hines has chronic axial low back pain, he has been treated longitudinally, mostly left-sided axial, worse with walking, better with rest and sitting. He has had several epidurals, he has had facet injections and radiofrequency ablation, he has seen Dr. Alden Hipp who suggested nothing was operable. I am going to go and set him up with a referral to pain management, I would also like a second opinion from a different neurosurgeon.

## 2023-04-24 NOTE — Assessment & Plan Note (Signed)
Initially elevated but much better on recheck.

## 2023-04-26 DIAGNOSIS — B351 Tinea unguium: Secondary | ICD-10-CM | POA: Diagnosis not present

## 2023-04-26 DIAGNOSIS — E0840 Diabetes mellitus due to underlying condition with diabetic neuropathy, unspecified: Secondary | ICD-10-CM | POA: Diagnosis not present

## 2023-04-26 DIAGNOSIS — G609 Hereditary and idiopathic neuropathy, unspecified: Secondary | ICD-10-CM | POA: Diagnosis not present

## 2023-04-26 DIAGNOSIS — L84 Corns and callosities: Secondary | ICD-10-CM | POA: Diagnosis not present

## 2023-04-26 DIAGNOSIS — M79675 Pain in left toe(s): Secondary | ICD-10-CM | POA: Diagnosis not present

## 2023-05-02 ENCOUNTER — Other Ambulatory Visit: Payer: Self-pay | Admitting: Sports Medicine

## 2023-05-24 DIAGNOSIS — M47816 Spondylosis without myelopathy or radiculopathy, lumbar region: Secondary | ICD-10-CM | POA: Diagnosis not present

## 2023-06-07 ENCOUNTER — Other Ambulatory Visit (INDEPENDENT_AMBULATORY_CARE_PROVIDER_SITE_OTHER): Payer: Medicare HMO

## 2023-06-07 ENCOUNTER — Ambulatory Visit (INDEPENDENT_AMBULATORY_CARE_PROVIDER_SITE_OTHER): Payer: Medicare HMO | Admitting: Sports Medicine

## 2023-06-07 DIAGNOSIS — E119 Type 2 diabetes mellitus without complications: Secondary | ICD-10-CM | POA: Diagnosis not present

## 2023-06-07 DIAGNOSIS — M7051 Other bursitis of knee, right knee: Secondary | ICD-10-CM

## 2023-06-07 DIAGNOSIS — Z794 Long term (current) use of insulin: Secondary | ICD-10-CM

## 2023-06-07 MED ORDER — TRIAMCINOLONE ACETONIDE 40 MG/ML IJ SUSP
40.0000 mg | Freq: Once | INTRAMUSCULAR | Status: AC
Start: 2023-06-07 — End: 2023-06-07
  Administered 2023-06-07: 40 mg via INTRAMUSCULAR

## 2023-06-07 MED ORDER — INSULIN PEN NEEDLE 32G X 4 MM MISC
4 refills | Status: AC
Start: 2023-06-07 — End: ?

## 2023-06-07 NOTE — Progress Notes (Signed)
    Procedures performed today:    Procedure: Real-time Ultrasound Guided aspiration/injection right infrapatellar bursitis Device: Samsung HS60  Verbal informed consent obtained.  Time-out conducted.  Noted no overlying erythema, induration, or other signs of local infection.  Skin prepped in a sterile fashion.  Local anesthesia: Topical Ethyl chloride.  With sterile technique and under real time ultrasound guidance: I aspirated approximately 6 mL of clear, straw-colored fluid, syringe switched and 1 cc lidocaine, 1 cc kenalog 40 injected easily. Completed without difficulty  Advised to call if fevers/chills, erythema, induration, drainage, or persistent bleeding.  Images permanently stored and available for review in PACS.  Impression: Technically successful ultrasound guided aspiration/injection.  Independent interpretation of notes and tests performed by another provider:   None.  Brief History, Exam, Impression, and Recommendations:    Infrapatellar bursitis of right knee Very pleasant 67 year old male, increasing swelling right knee inferior to the patella over the tibial tuberosity. It is soft, for the most part nontender, nonerythematous. This does appear to be an infrapatellar bursitis. He does occasionally work on his knees. He would like this taken care of today so we will do an aspiration and injection, I have also advised him to consider working on a foam mat or wearing kneepads when working while kneeling. I would like to see him back maybe 6 weeks as needed for this.    ____________________________________________ Ihor Austin. Benjamin Stain, M.D., ABFM., CAQSM., AME. Primary Care and Sports Medicine [facility]  Adjunct Professor of Family Medicine  [employer]  Restaurant manager, fast food

## 2023-06-07 NOTE — Assessment & Plan Note (Signed)
Very pleasant 67 year old male, increasing swelling right knee inferior to the patella over the tibial tuberosity. It is soft, for the most part nontender, nonerythematous. This does appear to be an infrapatellar bursitis. He does occasionally work on his knees. He would like this taken care of today so we will do an aspiration and injection, I have also advised him to consider working on a foam mat or wearing kneepads when working while kneeling. I would like to see him back maybe 6 weeks as needed for this.

## 2023-06-07 NOTE — Addendum Note (Signed)
Addended by: Carren Rang A on: 06/07/2023 02:54 PM   Modules accepted: Orders

## 2023-06-07 NOTE — Clinical Documentation Improvement (Signed)
done

## 2023-06-08 DIAGNOSIS — E0840 Diabetes mellitus due to underlying condition with diabetic neuropathy, unspecified: Secondary | ICD-10-CM | POA: Diagnosis not present

## 2023-06-08 DIAGNOSIS — L84 Corns and callosities: Secondary | ICD-10-CM | POA: Diagnosis not present

## 2023-06-08 DIAGNOSIS — B351 Tinea unguium: Secondary | ICD-10-CM | POA: Diagnosis not present

## 2023-06-08 DIAGNOSIS — G609 Hereditary and idiopathic neuropathy, unspecified: Secondary | ICD-10-CM | POA: Diagnosis not present

## 2023-06-08 DIAGNOSIS — M79675 Pain in left toe(s): Secondary | ICD-10-CM | POA: Diagnosis not present

## 2023-06-15 DIAGNOSIS — M5136 Other intervertebral disc degeneration, lumbar region with discogenic back pain only: Secondary | ICD-10-CM | POA: Diagnosis not present

## 2023-06-15 DIAGNOSIS — M5137 Other intervertebral disc degeneration, lumbosacral region with discogenic back pain only: Secondary | ICD-10-CM | POA: Diagnosis not present

## 2023-06-15 DIAGNOSIS — M47816 Spondylosis without myelopathy or radiculopathy, lumbar region: Secondary | ICD-10-CM | POA: Diagnosis not present

## 2023-06-15 DIAGNOSIS — R29898 Other symptoms and signs involving the musculoskeletal system: Secondary | ICD-10-CM | POA: Diagnosis not present

## 2023-06-29 ENCOUNTER — Other Ambulatory Visit: Payer: Self-pay | Admitting: Sports Medicine

## 2023-06-29 DIAGNOSIS — I10 Essential (primary) hypertension: Secondary | ICD-10-CM

## 2023-07-05 DIAGNOSIS — Z6831 Body mass index (BMI) 31.0-31.9, adult: Secondary | ICD-10-CM | POA: Diagnosis not present

## 2023-07-05 DIAGNOSIS — M47816 Spondylosis without myelopathy or radiculopathy, lumbar region: Secondary | ICD-10-CM | POA: Diagnosis not present

## 2023-07-06 ENCOUNTER — Other Ambulatory Visit: Payer: Self-pay | Admitting: Sports Medicine

## 2023-07-31 DIAGNOSIS — M79675 Pain in left toe(s): Secondary | ICD-10-CM | POA: Diagnosis not present

## 2023-07-31 DIAGNOSIS — B351 Tinea unguium: Secondary | ICD-10-CM | POA: Diagnosis not present

## 2023-07-31 DIAGNOSIS — L84 Corns and callosities: Secondary | ICD-10-CM | POA: Diagnosis not present

## 2023-07-31 DIAGNOSIS — E0840 Diabetes mellitus due to underlying condition with diabetic neuropathy, unspecified: Secondary | ICD-10-CM | POA: Diagnosis not present

## 2023-07-31 DIAGNOSIS — G609 Hereditary and idiopathic neuropathy, unspecified: Secondary | ICD-10-CM | POA: Diagnosis not present

## 2023-09-19 ENCOUNTER — Other Ambulatory Visit: Payer: Self-pay | Admitting: Sports Medicine

## 2023-09-19 DIAGNOSIS — E119 Type 2 diabetes mellitus without complications: Secondary | ICD-10-CM

## 2023-09-27 DIAGNOSIS — L218 Other seborrheic dermatitis: Secondary | ICD-10-CM | POA: Diagnosis not present

## 2023-09-27 DIAGNOSIS — M6740 Ganglion, unspecified site: Secondary | ICD-10-CM | POA: Diagnosis not present

## 2023-10-05 DIAGNOSIS — M79675 Pain in left toe(s): Secondary | ICD-10-CM | POA: Diagnosis not present

## 2023-10-05 DIAGNOSIS — L84 Corns and callosities: Secondary | ICD-10-CM | POA: Diagnosis not present

## 2023-10-05 DIAGNOSIS — G609 Hereditary and idiopathic neuropathy, unspecified: Secondary | ICD-10-CM | POA: Diagnosis not present

## 2023-10-05 DIAGNOSIS — E0840 Diabetes mellitus due to underlying condition with diabetic neuropathy, unspecified: Secondary | ICD-10-CM | POA: Diagnosis not present

## 2023-10-05 DIAGNOSIS — B351 Tinea unguium: Secondary | ICD-10-CM | POA: Diagnosis not present

## 2023-10-26 ENCOUNTER — Other Ambulatory Visit: Payer: Self-pay | Admitting: Sports Medicine

## 2023-10-26 DIAGNOSIS — E119 Type 2 diabetes mellitus without complications: Secondary | ICD-10-CM

## 2023-10-30 ENCOUNTER — Encounter: Payer: Self-pay | Admitting: Sports Medicine

## 2023-10-30 ENCOUNTER — Ambulatory Visit (INDEPENDENT_AMBULATORY_CARE_PROVIDER_SITE_OTHER): Payer: Medicare HMO | Admitting: Sports Medicine

## 2023-10-30 VITALS — BP 112/64 | HR 96 | Resp 20 | Ht 73.0 in

## 2023-10-30 DIAGNOSIS — Z0000 Encounter for general adult medical examination without abnormal findings: Secondary | ICD-10-CM | POA: Diagnosis not present

## 2023-10-30 DIAGNOSIS — Z794 Long term (current) use of insulin: Secondary | ICD-10-CM

## 2023-10-30 DIAGNOSIS — E119 Type 2 diabetes mellitus without complications: Secondary | ICD-10-CM

## 2023-10-30 DIAGNOSIS — R1031 Right lower quadrant pain: Secondary | ICD-10-CM

## 2023-10-30 DIAGNOSIS — N200 Calculus of kidney: Secondary | ICD-10-CM | POA: Diagnosis not present

## 2023-10-30 DIAGNOSIS — N139 Obstructive and reflux uropathy, unspecified: Secondary | ICD-10-CM | POA: Diagnosis not present

## 2023-10-30 DIAGNOSIS — M47816 Spondylosis without myelopathy or radiculopathy, lumbar region: Secondary | ICD-10-CM | POA: Diagnosis not present

## 2023-10-30 LAB — POCT GLYCOSYLATED HEMOGLOBIN (HGB A1C): Hemoglobin A1C: 6.8 % — AB (ref 4.0–5.6)

## 2023-10-30 NOTE — Progress Notes (Signed)
 Subjective:    CC: Annual Physical Exam  HPI:  This patient is here for their annual physical  I reviewed the past medical history, family history, social history, surgical history, and allergies today and no changes were needed.  Please see the problem list section below in epic for further details.  Past Medical History: Past Medical History:  Diagnosis Date   Arthritis    neck   COVID-19    Depression    Diabetes mellitus without complication (HCC)    type 2   Hyperlipidemia    Hypertension    Pneumonia    covid pneumonia and covid july 2020, d/ced july 16th from [facility] stayed 9 days   Renal disorder    calculi, multiple lithotripsies, stents   Right cataract    Rotator cuff disorder    Spinal pain    Past Surgical History: Past Surgical History:  Procedure Laterality Date   ANTERIOR FUSION CERVICAL SPINE  2007   ARTHOSCOPIC ROTAOR CUFF REPAIR Left 03/21/2019   Procedure: ARTHROSCOPIC ROTATOR CUFF REPAIR;  Surgeon: Sammye Cristal, MD;  Location: [facility] [place];  Service: Orthopedics;  Laterality: Left;   CYSTOSCOPY/RETROGRADE/URETEROSCOPY/STONE EXTRACTION WITH BASKET  08/2021   EYE SURGERY Left    cataract   FOOT SURGERY Right    pin placed and removed   HERNIA REPAIR     inguinal   LITHOTRIPSY     several   SHOULDER ARTHROSCOPY WITH ROTATOR CUFF REPAIR AND SUBACROMIAL DECOMPRESSION Right 12/28/2015   Procedure: SHOULDER ARTHROSCOPY TAKE DOWN ROTATOR CUFF REPAIR AND SUBACROMIAL DECOMPRESSION, DEBRIDEMENT OF SLAP TEAR;  Surgeon: Sammye Cristal, MD;  Location: [facility];  Service: Orthopedics;  Laterality: Right;  SHOULDER ARTHROSCOPY TAKE DOWN ROTATOR CUFF REPAIR AND SUBACROMIAL DECOMPRESSION   SHOULDER ARTHROSCOPY WITH SUBACROMIAL DECOMPRESSION Left 03/21/2019   Procedure: SHOULDER ARTHROSCOPY WITH SUBACROMIAL DECOMPRESSION;  Surgeon: Sammye Cristal, MD;  Location: [facility] [place];  Service: Orthopedics;   Laterality: Left;   VASECTOMY Bilateral    Social History: Social History   Socioeconomic History   Marital status: Married    Spouse name: Alvy Baar Lorelee Roger)   Number of children: 2   Years of education: 12th grade   Highest education level: 12th grade  Occupational History   Occupation: Retired  Tobacco Use   Smoking status: Former    Current packs/day: 0.00    Average packs/day: 1 pack/day for 20.0 years (20.0 ttl pk-yrs)    Types: Cigarettes    Start date: 10/01/1998    Quit date: 10/01/2018    Years since quitting: 5.0   Smokeless tobacco: Never  Vaping Use   Vaping status: Never Used  Substance and Sexual Activity   Alcohol use: Yes    Alcohol/week: 0.0 standard drinks of alcohol    Comment: social   Drug use: No   Sexual activity: Yes    Birth control/protection: None  Other Topics Concern   Not on file  Social History Narrative   Lives with his wife. He has recently retired and is working on finding new hobbies. He loves to do things outdoors such as gardening or fishing.    Social Drivers of Corporate investment banker Strain: Low Risk  (06/07/2023)   Overall Financial Resource Strain (CARDIA)    Difficulty of Paying Living Expenses: Not very hard  Food Insecurity: No Food Insecurity (06/07/2023)   Hunger Vital Sign    Worried About Running Out of Food in the Last Year: Never true  Ran Out of Food in the Last Year: Never true  Transportation Needs: No Transportation Needs (06/07/2023)   PRAPARE - Administrator, Civil Service (Medical): No    Lack of Transportation (Non-Medical): No  Physical Activity: Inactive (06/07/2023)   Exercise Vital Sign    Days of Exercise per Week: 0 days    Minutes of Exercise per Session: 0 min  Stress: No Stress Concern Present (06/07/2023)   [employer] of Occupational Health - Occupational Stress Questionnaire    Feeling of Stress : Only a little  Social Connections: Moderately Isolated (06/07/2023)    Social Connection and Isolation Panel [NHANES]    Frequency of Communication with Friends and Family: More than three times a week    Frequency of Social Gatherings with Friends and Family: Once a week    Attends Religious Services: Never    Database administrator or Organizations: No    Attends Engineer, structural: Never    Marital Status: Married   Family History: Family History  Problem Relation Age of Onset   Hypertension Mother    Heart failure Father    Diabetes Father    Hypertension Father    Heart failure Sister    Hypertension Sister    Allergies: Allergies  Allergen Reactions   Augmentin [Amoxicillin-Pot Clavulanate] Diarrhea   Codeine Diarrhea and Nausea Only   Duloxetine Diarrhea, Nausea Only and Rash   Medications: See med rec.  Review of Systems: No headache, visual changes, nausea, vomiting, diarrhea, constipation, dizziness, abdominal pain, skin rash, fevers, chills, night sweats, swollen lymph nodes, weight loss, chest pain, body aches, joint swelling, muscle aches, shortness of breath, mood changes, visual or auditory hallucinations.  Objective:    General: Well Developed, well nourished, and in no acute distress.  Neuro: Alert and oriented x3, extra-ocular muscles intact, sensation grossly intact. Cranial nerves II through XII are intact, motor, sensory, and coordinative functions are all intact. HEENT: Normocephalic, atraumatic, pupils equal round reactive to light, neck supple, no masses, no lymphadenopathy, thyroid  nonpalpable. Oropharynx, nasopharynx, external ear canals are unremarkable. Skin: Warm and dry, no rashes noted.  Cardiac: Regular rate and rhythm, no murmurs rubs or gallops.  Respiratory: Clear to auscultation bilaterally. Not using accessory muscles, speaking in full sentences.  Abdominal: Soft, nontender, nondistended, positive bowel sounds, no masses, no organomegaly.  Musculoskeletal: Shoulder, elbow, wrist, hip, knee, ankle  stable, and with full range of motion.  Impression and Recommendations:    The patient was counselled, risk factors were discussed, anticipatory guidance given.  Diabetes mellitus, type 2 Doing well on Ozempic  2 mg, A1c well-controlled, checking additional routine screenings, return in a year for this.  Annual physical exam Annual physical as above. Return in a year.  Nephrolithiasis History nephrolithiasis, he does have an appointment coming up later with his urologist, we will going get the urinalysis and if positive we will consider additional imaging.  Update: He was having some right sided flank pain, combined with blood in the urine and an upcoming visit with urology I would like to go and pull the trigger for the CT stone protocol.  Lumbar spondylosis Chronic axial low back pain, inoperable spondylitic change on MRI, recently had another neurosurgical consult with [facility], he has had several epidurals, facet injections, RFA, persistent pain, he has had chiropractic manipulation, acupuncture. Nothing is working, he is a candidate for medical pain management at this point.   ____________________________________________ Joselyn Nicely. Sandy Crumb, M.D., ABFM., CAQSM., AME. Primary  Care and Sports Medicine [facility]  Adjunct Professor of [facility] Medicine  [employer]  Restaurant manager, fast food

## 2023-10-30 NOTE — Patient Instructions (Signed)
 Referral was for [facility]

## 2023-10-30 NOTE — Assessment & Plan Note (Addendum)
 History nephrolithiasis, he does have an appointment coming up later with his urologist, we will going get the urinalysis and if positive we will consider additional imaging.  Update: He was having some right sided flank pain, combined with blood in the urine and an upcoming visit with urology I would like to go and pull the trigger for the CT stone protocol.

## 2023-10-30 NOTE — Assessment & Plan Note (Signed)
 Chronic axial low back pain, inoperable spondylitic change on MRI, recently had another neurosurgical consult with [facility], he has had several epidurals, facet injections, RFA, persistent pain, he has had chiropractic manipulation, acupuncture. Nothing is working, he is a candidate for medical pain management at this point.

## 2023-10-30 NOTE — Assessment & Plan Note (Signed)
Annual physical as above. Return in a year.

## 2023-10-30 NOTE — Assessment & Plan Note (Signed)
 Doing well on Ozempic  2 mg, A1c well-controlled, checking additional routine screenings, return in a year for this.

## 2023-10-31 ENCOUNTER — Ambulatory Visit: Payer: Self-pay | Admitting: Sports Medicine

## 2023-10-31 NOTE — Addendum Note (Signed)
 Addended by: Gean Keels on: 10/31/2023 10:03 AM   Modules accepted: Orders

## 2023-11-01 ENCOUNTER — Ambulatory Visit

## 2023-11-01 DIAGNOSIS — R1031 Right lower quadrant pain: Secondary | ICD-10-CM

## 2023-11-01 DIAGNOSIS — R109 Unspecified abdominal pain: Secondary | ICD-10-CM | POA: Diagnosis not present

## 2023-11-01 DIAGNOSIS — R319 Hematuria, unspecified: Secondary | ICD-10-CM

## 2023-11-01 DIAGNOSIS — N200 Calculus of kidney: Secondary | ICD-10-CM

## 2023-11-01 LAB — COMPREHENSIVE METABOLIC PANEL WITH GFR
ALT: 32 IU/L (ref 0–44)
AST: 32 IU/L (ref 0–40)
Albumin: 4.3 g/dL (ref 3.9–4.9)
Alkaline Phosphatase: 58 IU/L (ref 44–121)
BUN/Creatinine Ratio: 14 (ref 10–24)
BUN: 14 mg/dL (ref 8–27)
Bilirubin Total: 0.3 mg/dL (ref 0.0–1.2)
CO2: 21 mmol/L (ref 20–29)
Calcium: 9.2 mg/dL (ref 8.6–10.2)
Chloride: 101 mmol/L (ref 96–106)
Creatinine, Ser: 1.01 mg/dL (ref 0.76–1.27)
Globulin, Total: 2.2 g/dL (ref 1.5–4.5)
Glucose: 99 mg/dL (ref 70–99)
Potassium: 4.4 mmol/L (ref 3.5–5.2)
Sodium: 143 mmol/L (ref 134–144)
Total Protein: 6.5 g/dL (ref 6.0–8.5)
eGFR: 81 mL/min/{1.73_m2} (ref >59)

## 2023-11-01 LAB — PSA, TOTAL AND FREE
PSA, Free Pct: 40 %
PSA, Free: 0.08 ng/mL
Prostate Specific Ag, Serum: 0.2 ng/mL (ref 0.0–4.0)

## 2023-11-01 LAB — CBC
Hematocrit: 41 % (ref 37.5–51.0)
Hemoglobin: 13.7 g/dL (ref 13.0–17.7)
MCH: 33.7 pg — ABNORMAL HIGH (ref 26.6–33.0)
MCHC: 33.4 g/dL (ref 31.5–35.7)
MCV: 101 fL — ABNORMAL HIGH (ref 79–97)
Platelets: 169 x10E3/uL (ref 150–450)
RBC: 4.06 x10E6/uL — ABNORMAL LOW (ref 4.14–5.80)
RDW: 13.4 % (ref 11.6–15.4)
WBC: 7 10*3/uL (ref 3.4–10.8)

## 2023-11-01 LAB — TSH: TSH: 2.22 u[IU]/mL (ref 0.450–4.500)

## 2023-11-01 LAB — MICROSCOPIC EXAMINATION
Bacteria, UA: NONE SEEN
Casts: NONE SEEN /LPF
RBC, Urine: NONE SEEN /HPF (ref 0–2)

## 2023-11-01 LAB — LIPID PANEL
Chol/HDL Ratio: 3.1 ratio (ref 0.0–5.0)
Cholesterol, Total: 123 mg/dL (ref 100–199)
HDL: 40 mg/dL (ref >39)
LDL Chol Calc (NIH): 55 mg/dL (ref 0–99)
Triglycerides: 163 mg/dL — ABNORMAL HIGH (ref 0–149)
VLDL Cholesterol Cal: 28 mg/dL (ref 5–40)

## 2023-11-01 LAB — UA/M W/RFLX CULTURE, ROUTINE
Bilirubin, UA: NEGATIVE
Glucose, UA: NEGATIVE
Leukocytes,UA: NEGATIVE
Nitrite, UA: NEGATIVE
Specific Gravity, UA: 1.027 (ref 1.005–1.030)
Urobilinogen, Ur: 0.2 mg/dL (ref 0.2–1.0)
pH, UA: 5.5 (ref 5.0–7.5)

## 2023-11-01 LAB — URINE CULTURE, REFLEX: Organism ID, Bacteria: NO GROWTH

## 2023-11-01 LAB — MICROALBUMIN / CREATININE URINE RATIO
Creatinine, Urine: 240.8 mg/dL
Microalb/Creat Ratio: 206 mg/g{creat} — ABNORMAL HIGH (ref 0–29)
Microalbumin, Urine: 495.3 ug/mL

## 2023-11-03 DIAGNOSIS — R3912 Poor urinary stream: Secondary | ICD-10-CM | POA: Diagnosis not present

## 2023-11-03 DIAGNOSIS — N401 Enlarged prostate with lower urinary tract symptoms: Secondary | ICD-10-CM | POA: Diagnosis not present

## 2023-11-03 DIAGNOSIS — N200 Calculus of kidney: Secondary | ICD-10-CM | POA: Diagnosis not present

## 2023-11-09 DIAGNOSIS — N200 Calculus of kidney: Secondary | ICD-10-CM | POA: Diagnosis not present

## 2023-11-09 DIAGNOSIS — N23 Unspecified renal colic: Secondary | ICD-10-CM | POA: Diagnosis not present

## 2023-11-14 ENCOUNTER — Ambulatory Visit (INDEPENDENT_AMBULATORY_CARE_PROVIDER_SITE_OTHER): Payer: Medicare HMO

## 2023-11-14 VITALS — Ht 73.0 in | Wt 240.0 lb

## 2023-11-14 DIAGNOSIS — Z0000 Encounter for general adult medical examination without abnormal findings: Secondary | ICD-10-CM | POA: Diagnosis not present

## 2023-11-14 NOTE — Progress Notes (Signed)
 Subjective:   Robert Hines is a 68 y.o. male who presents for Medicare Annual/Subsequent preventive examination.  Visit Complete: Virtual I connected with  Robert Hines on 11/14/23 by a audio enabled telemedicine application and verified that I am speaking with the correct person using two identifiers.  Patient Location: Home  Provider Location: Office/Clinic  I discussed the limitations of evaluation and management by telemedicine. The patient expressed understanding and agreed to proceed.  Vital Signs: Because this visit was a virtual/telehealth visit, some criteria may be missing or patient reported. Any vitals not documented were not able to be obtained and vitals that have been documented are patient reported.  Patient Medicare AWV questionnaire was completed by the patient on 11/12/2023; I have confirmed that all information answered by patient is correct and no changes since this date.  Cardiac Risk Factors include: male gender;advanced age (>62men, >68 women);obesity (BMI >30kg/m2);sedentary lifestyle;smoking/ tobacco exposure;diabetes mellitus;dyslipidemia;hypertension;family history of premature cardiovascular disease     Objective:    Today's Vitals   11/14/23 1437  Weight: 240 lb (108.9 kg)  Height: 6\' 1"  (1.854 m)  PainSc: 4    Body mass index is 31.66 kg/m.     11/14/2023    2:51 PM 11/07/2022   11:42 AM 11/01/2021    3:11 PM 08/14/2020   11:26 AM 04/04/2019   11:04 AM 03/21/2019    8:04 AM 08/25/2017    1:52 PM  Advanced Directives  Does Patient Have a Medical Advance Directive? No No No No No No No  Would patient like information on creating a medical advance directive? No - Patient declined No - Patient declined No - Patient declined No - Patient declined No - Patient declined No - Patient declined No - Patient declined    Current Medications (verified) Outpatient Encounter Medications as of 11/14/2023  Medication Sig   buPROPion  (WELLBUTRIN  XL) 300  MG 24 hr tablet TAKE 1 TABLET BY MOUTH DAILY   gabapentin  (NEURONTIN ) 300 MG capsule TAKE 3 CAPSULES IN THE MORNING AND 4 CAPSULES IN THE EVENING   glipiZIDE  (GLUCOTROL ) 5 MG tablet TAKE 1 TABLET BY MOUTH DAILY BEFORE BREAKFAST   HYDROcodone -acetaminophen  (NORCO/VICODIN) 5-325 MG tablet Take by mouth.   Insulin  Glargine (BASAGLAR  KWIKPEN) 100 UNIT/ML INJECT 94 UNITS UNDER THE SKIN EVERY NIGHT AT BEDTIME   Insulin  Pen Needle 32G X 4 MM MISC Use as needed with insulin    ketorolac  (TORADOL ) 10 MG tablet Take 1 tablet (10 mg total) by mouth every 8 (eight) hours as needed.   lisinopril  (ZESTRIL ) 40 MG tablet TAKE 1 TABLET BY MOUTH DAILY   metFORMIN  (GLUCOPHAGE ) 1000 MG tablet TAKE 1 TABLET BY MOUTH TWICE A DAY WITH A MEAL   Probiotic Product (PROBIOTIC-10 PO) Take by mouth.   Semaglutide , 2 MG/DOSE, (OZEMPIC , 2 MG/DOSE,) 8 MG/3ML SOPN Inject 2 mg into the skin once a week.   simvastatin  (ZOCOR ) 40 MG tablet TAKE 1 TABLET BY MOUTH DAILY   tamsulosin (FLOMAX) 0.4 MG CAPS capsule Take 0.4 mg by mouth daily.   zolpidem  (AMBIEN ) 10 MG tablet TAKE 1 TABLET BY MOUTH EVERY NIGHT AT BEDTIME AS NEEDED FOR SLEEP (Patient taking differently: Take 5 mg by mouth at bedtime as needed. for sleep)   aspirin  81 MG chewable tablet Chew by mouth daily. (Patient not taking: Reported on 11/14/2023)   [DISCONTINUED] colestipol (COLESTID) 1 g tablet Take 1 g by mouth daily.   No facility-administered encounter medications on file as of 11/14/2023.  Allergies (verified) Augmentin [amoxicillin-pot clavulanate], Codeine, and Duloxetine   History: Past Medical History:  Diagnosis Date   Arthritis    neck   COVID-19    Depression    Diabetes mellitus without complication (HCC)    type 2   Hyperlipidemia    Hypertension    Pneumonia    covid pneumonia and covid july 2020, d/ced july 16th from [facility] stayed 9 days   Renal disorder    calculi, multiple lithotripsies, stents   Right cataract    Rotator  cuff disorder    Spinal pain    Past Surgical History:  Procedure Laterality Date   ANTERIOR FUSION CERVICAL SPINE  2007   ARTHOSCOPIC ROTAOR CUFF REPAIR Left 03/21/2019   Procedure: ARTHROSCOPIC ROTATOR CUFF REPAIR;  Surgeon: Sammye Cristal, MD;  Location: [facility];  Service: Orthopedics;  Laterality: Left;   CYSTOSCOPY/RETROGRADE/URETEROSCOPY/STONE EXTRACTION WITH BASKET  08/2021   EYE SURGERY Left    cataract   FOOT SURGERY Right    pin placed and removed   HERNIA REPAIR     inguinal   LITHOTRIPSY     several   SHOULDER ARTHROSCOPY WITH ROTATOR CUFF REPAIR AND SUBACROMIAL DECOMPRESSION Right 12/28/2015   Procedure: SHOULDER ARTHROSCOPY TAKE DOWN ROTATOR CUFF REPAIR AND SUBACROMIAL DECOMPRESSION, DEBRIDEMENT OF SLAP TEAR;  Surgeon: Sammye Cristal, MD;  Location: [facility];  Service: Orthopedics;  Laterality: Right;  SHOULDER ARTHROSCOPY TAKE DOWN ROTATOR CUFF REPAIR AND SUBACROMIAL DECOMPRESSION   SHOULDER ARTHROSCOPY WITH SUBACROMIAL DECOMPRESSION Left 03/21/2019   Procedure: SHOULDER ARTHROSCOPY WITH SUBACROMIAL DECOMPRESSION;  Surgeon: Sammye Cristal, MD;  Location: [facility];  Service: Orthopedics;  Laterality: Left;   VASECTOMY Bilateral    Family History  Problem Relation Age of Onset   Hypertension Mother    Heart failure Father    Diabetes Father    Hypertension Father    Heart failure Sister    Hypertension Sister    Social History   Socioeconomic History   Marital status: Married    Spouse name: Robert Hines)   Number of children: 2   Years of education: 12th grade   Highest education level: 12th grade  Occupational History   Occupation: Retired  Tobacco Use   Smoking status: Former    Current packs/day: 0.00    Average packs/day: 1 pack/day for 20.0 years (20.0 ttl pk-yrs)    Types: Cigarettes    Start date: 10/01/1998    Quit date: 10/01/2018    Years since quitting: 5.1   Smokeless tobacco: Never   Vaping Use   Vaping status: Never Used  Substance and Sexual Activity   Alcohol use: Yes    Alcohol/week: 0.0 standard drinks of alcohol    Comment: social   Drug use: No   Sexual activity: Yes    Birth control/protection: None  Other Topics Concern   Not on file  Social History Narrative   Lives with his wife. He has recently retired and is working on finding new hobbies. He loves to do things outdoors such as gardening or fishing.    Social Drivers of Corporate investment banker Strain: Low Risk  (11/14/2023)   Overall Financial Resource Strain (CARDIA)    Difficulty of Paying Living Expenses: Not hard at all  Food Insecurity: No Food Insecurity (11/14/2023)   Hunger Vital Sign    Worried About Running Out of Food in the Last Year: Never true    Ran Out of Food in the  Last Year: Never true  Transportation Needs: No Transportation Needs (11/14/2023)   PRAPARE - Administrator, Civil Service (Medical): No    Lack of Transportation (Non-Medical): No  Physical Activity: Inactive (11/14/2023)   Exercise Vital Sign    Days of Exercise per Week: 0 days    Minutes of Exercise per Session: 0 min  Stress: No Stress Concern Present (11/14/2023)   [employer] of Occupational Health - Occupational Stress Questionnaire    Feeling of Stress : Not at all  Social Connections: Moderately Isolated (11/14/2023)   Social Connection and Isolation Panel [NHANES]    Frequency of Communication with Friends and Family: More than three times a week    Frequency of Social Gatherings with Friends and Family: More than three times a week    Attends Religious Services: Never    Database administrator or Organizations: No    Attends Engineer, structural: Never    Marital Status: Married    Tobacco Counseling Counseling given: Not Answered   Clinical Intake:  Pre-visit preparation completed: Yes  Pain : 0-10 Pain Score: 4  Pain Type: Chronic pain Pain Location:  Flank Pain Orientation: Right Pain Descriptors / Indicators: Sharp Pain Onset: More than a month ago Pain Frequency: Intermittent     BMI - recorded: 31.66 Nutritional Status: BMI > 30  Obese Nutritional Risks: None Diabetes: Yes CBG done?: No Did pt. bring in CBG monitor from home?: No  How often do you need to have someone help you when you read instructions, pamphlets, or other written materials from your doctor or pharmacy?: 1 - Never What is the last grade level you completed in school?: 12  Interpreter Needed?: No      Activities of Daily Living    11/14/2023    2:41 PM 11/12/2023   11:19 AM  In your present state of health, do you have any difficulty performing the following activities:  Hearing? 1 1  Vision? 0 0  Difficulty concentrating or making decisions? 0 0  Walking or climbing stairs? 1 1  Dressing or bathing? 0 0  Doing errands, shopping? 0 0  Preparing Food and eating ? N N  Using the Toilet? N N  In the past six months, have you accidently leaked urine? N N  Do you have problems with loss of bowel control? N N  Managing your Medications? Y Y  Comment Wife helps manage is medications.   Managing your Finances? N N  Housekeeping or managing your Housekeeping? N N    Patient Care Team: Gean Keels, MD as PCP - General (Family Medicine) Alys Julian, MD as Referring Physician (Urology)  Indicate any recent Medical Services you may have received from other than [facility] providers in the past year (date may be approximate).     Assessment:   This is a routine wellness examination for Robert Hines.  Hearing/Vision screen No results found.   Goals Addressed             This Visit's Progress    Patient Stated       Patient stated he would like to have his back pain resolved.       Depression Screen    11/14/2023    2:50 PM 10/30/2023    2:00 PM 04/24/2023    2:23 PM 11/07/2022   11:42 AM 10/25/2022   10:57 AM 04/05/2022    9:35 AM  11/01/2021    3:02 PM  PHQ  2/9 Scores  PHQ - 2 Score 0 0 0 0 0 0 0  PHQ- 9 Score  0 0  0 0 0    Fall Risk    11/14/2023    2:52 PM 11/12/2023   11:19 AM 10/30/2023    1:59 PM 04/24/2023    2:08 PM 11/07/2022   11:42 AM  Fall Risk   Falls in the past year? 0 0 0 0 0  Number falls in past yr: 0 0 0 0 0  Injury with Fall? 0  0 0 0  Risk for fall due to : No Fall Risks    No Fall Risks  Follow up Falls evaluation completed  Falls evaluation completed Falls evaluation completed Falls evaluation completed    MEDICARE RISK AT HOME: Medicare Risk at Home Any stairs in or around the home?: Yes If so, are there any without handrails?: No Home free of loose throw rugs in walkways, pet beds, electrical cords, etc?: No Adequate lighting in your home to reduce risk of falls?: Yes Life alert?: No Use of a cane, walker or w/c?: Yes Grab bars in the bathroom?: Yes Shower chair or bench in shower?: No Elevated toilet seat or a handicapped toilet?: Yes  TIMED UP AND GO:  Was the test performed?  No    Cognitive Function:        11/14/2023    2:52 PM 11/07/2022   11:49 AM 11/01/2021    3:16 PM 08/14/2020   11:34 AM  6CIT Screen  What Year? 0 points 0 points 0 points 0 points  What month? 0 points 0 points 0 points 0 points  What time? 0 points 0 points 0 points 0 points  Count back from 20 0 points 0 points 0 points 0 points  Months in reverse 0 points 0 points 2 points 0 points  Repeat phrase 0 points 0 points 0 points 2 points  Total Score 0 points 0 points 2 points 2 points    Immunizations Immunization History  Administered Date(s) Administered   Fluad Quad(high Dose 65+) 04/02/2019, 04/05/2022   Influenza-Unspecified 04/03/2014, 03/31/2015, 03/12/2020, 04/26/2021, 03/21/2023   PFIZER(Purple Top)SARS-COV-2 Vaccination 08/02/2019, 08/28/2019   Pneumococcal Conjugate-13 08/07/2020   Pneumococcal Polysaccharide-23 01/23/2015, 04/02/2019   Td 05/21/2008   Tdap 05/24/2018   Zoster  Recombinant(Shingrix ) 11/11/2020, 12/30/2020   Zoster, Live 01/23/2015    TDAP status: Up to date  Flu Vaccine status: Up to date  Pneumococcal vaccine status: Up to date  Covid-19 vaccine status: Completed vaccines  Qualifies for Shingles Vaccine? Yes   Zostavax completed Yes   Shingrix  Completed?: Yes  Screening Tests Health Maintenance  Topic Date Due   COVID-19 Vaccine (3 - Pfizer risk series) 09/25/2019   FOOT EXAM  08/07/2021   OPHTHALMOLOGY EXAM  01/18/2024   INFLUENZA VACCINE  01/19/2024   Lung Cancer Screening  02/29/2024   HEMOGLOBIN A1C  05/01/2024   Diabetic kidney evaluation - eGFR measurement  10/29/2024   Diabetic kidney evaluation - Urine ACR  10/29/2024   Medicare Annual Wellness (AWV)  11/13/2024   Colonoscopy  03/08/2025   DTaP/Tdap/Td (3 - Td or Tdap) 05/24/2028   Pneumonia Vaccine 15+ Years old  Completed   Hepatitis C Screening  Completed   Zoster Vaccines- Shingrix   Completed   HPV VACCINES  Aged Out   Meningococcal B Vaccine  Aged Out    Health Maintenance  Health Maintenance Due  Topic Date Due   COVID-19 Vaccine (3 - Pfizer risk  series) 09/25/2019   FOOT EXAM  08/07/2021    Colorectal cancer screening: Type of screening: Colonoscopy. Completed 03/09/2015. Repeat every 10 years  Lung Cancer Screening: (Low Dose CT Chest recommended if Age 48-80 years, 20 pack-year currently smoking OR have quit w/in 15years.) does qualify.   Lung Cancer Screening Referral: already in program  Additional Screening:  Hepatitis C Screening: does qualify; Completed 05/02/2016  Vision Screening: Recommended annual ophthalmology exams for early detection of glaucoma and other disorders of the eye. Is the patient up to date with their annual eye exam?  Yes  Who is the provider or what is the name of the office in which the patient attends annual eye exams? [facility] Dr Raquel Cables If pt is not established with a provider, would they like to be referred to a provider  to establish care? N/a.   Dental Screening: Recommended annual dental exams for proper oral hygiene  Diabetic Foot Exam: Diabetic Foot Exam: Overdue, Pt has been advised about the importance in completing this exam. Pt is scheduled for diabetic foot exam on 10/30/2024.  Community Resource Referral / Chronic Care Management: CRR required this visit?  No   CCM required this visit?  No     Plan:     I have personally reviewed and noted the following in the patient's chart:   Medical and social history Use of alcohol, tobacco or illicit drugs  Current medications and supplements including opioid prescriptions. Patient is currently taking opioid prescriptions. Information provided to patient regarding non-opioid alternatives. Patient advised to discuss non-opioid treatment plan with their provider. Functional ability and status Nutritional status Physical activity Advanced directives List of other physicians Hospitalizations, surgeries, and ER # 1 visits in previous 12 months Vitals Screenings to include cognitive, depression, and falls Referrals and appointments  In addition, I have reviewed and discussed with patient certain preventive protocols, quality metrics, and best practice recommendations. A written personalized care plan for preventive services as well as general preventive health recommendations were provided to patient.     Aubrey Leaf, CMA   11/14/2023   After Visit Summary: (MyChart) Due to this being a telephonic visit, the after visit summary with patients personalized plan was offered to patient via MyChart   Nurse Notes:   Robert Hines is a 68 y.o. male patient of Thekkekandam, Joselyn Nicely, MD who had a Medicare Annual Wellness Visit today via telephone. Robert Hines is Retired and lives with their spouse. He has 2 children. He reports that he is socially active and does interact with friends/family regularly. He is minimally physically active and enjoys being  outdoors.

## 2023-11-14 NOTE — Patient Instructions (Signed)
 [name] , Thank you for taking time to come for your Medicare Wellness Visit. I appreciate your ongoing commitment to your health goals. Please review the following plan we discussed and let me know if I can assist you in the future.   These are the goals we discussed:  Goals       Patient Stated (pt-stated)      08/14/2020 AWV Goal: Diabetes Management  Patient will maintain an A1C level below 7.5 Patient will not develop any diabetic foot complications Patient will not experience any hypoglycemic episodes over the next 3 months Patient will notify our office of any CBG readings outside of the provider recommended range by calling [phone] Patient will adhere to provider recommendations for diabetes management  Patient Self Management Activities take all medications as prescribed and report any negative side effects monitor and record blood sugar readings as directed adhere to a low carbohydrate diet that incorporates lean proteins, vegetables, whole grains, low glycemic fruits check feet daily noting any sores, cracks, injuries, or callous formations see PCP or podiatrist if he notices any changes in his legs, feet, or toenails Patient will visit PCP and have an A1C level checked every 3 to 6 months as directed  have a yearly eye exam to monitor for vascular changes associated with diabetes and will request that the report be sent to his pcp.  consult with his PCP regarding any changes in his health or new or worsening symptoms       Patient Stated (pt-stated)      11/01/2021 AWV Goal: Diabetes Management  Patient will maintain an A1C level below 7.0 Patient will not develop any diabetic foot complications Patient will not experience any hypoglycemic episodes over the next 3 months Patient will notify our office of any CBG readings outside of the provider recommended range by calling [phone] Patient will adhere to provider recommendations for diabetes  management  Patient Self Management Activities take all medications as prescribed and report any negative side effects monitor and record blood sugar readings as directed adhere to a low carbohydrate diet that incorporates lean proteins, vegetables, whole grains, low glycemic fruits check feet daily noting any sores, cracks, injuries, or callous formations see PCP or podiatrist if he notices any changes in his legs, feet, or toenails Patient will visit PCP and have an A1C level checked every 3 to 6 months as directed  have a yearly eye exam to monitor for vascular changes associated with diabetes and will request that the report be sent to his pcp.  consult with his PCP regarding any changes in his health or new or worsening symptoms       Patient Stated (pt-stated)      Patient would like to have his back pain resolved.       Patient Stated      Patient stated he would like to have his back pain resolved.         This is a list of the screening recommended for you and due dates:  Health Maintenance  Topic Date Due   COVID-19 Vaccine (3 - Pfizer risk series) 09/25/2019   Complete foot exam   08/07/2021   Eye exam for diabetics  01/18/2024   Flu Shot  01/19/2024   Screening for Lung Cancer  02/29/2024   Hemoglobin A1C  05/01/2024   Yearly kidney function blood test for diabetes  10/29/2024   Yearly kidney health urinalysis for diabetes  10/29/2024   Medicare Annual Wellness Visit  11/13/2024   Colon Cancer Screening  03/08/2025   DTaP/Tdap/Td vaccine (3 - Td or Tdap) 05/24/2028   Pneumonia Vaccine  Completed   Hepatitis C Screening  Completed   Zoster (Shingles) Vaccine  Completed   HPV Vaccine  Aged Out   Meningitis B Vaccine  Aged Out

## 2023-11-24 ENCOUNTER — Other Ambulatory Visit: Payer: Self-pay | Admitting: Sports Medicine

## 2023-11-24 DIAGNOSIS — Z794 Long term (current) use of insulin: Secondary | ICD-10-CM

## 2023-11-26 DIAGNOSIS — A419 Sepsis, unspecified organism: Secondary | ICD-10-CM | POA: Diagnosis not present

## 2023-11-26 DIAGNOSIS — N132 Hydronephrosis with renal and ureteral calculous obstruction: Secondary | ICD-10-CM | POA: Diagnosis not present

## 2023-11-27 DIAGNOSIS — I10 Essential (primary) hypertension: Secondary | ICD-10-CM | POA: Diagnosis not present

## 2023-11-27 DIAGNOSIS — B9689 Other specified bacterial agents as the cause of diseases classified elsewhere: Secondary | ICD-10-CM | POA: Diagnosis not present

## 2023-11-27 DIAGNOSIS — G4733 Obstructive sleep apnea (adult) (pediatric): Secondary | ICD-10-CM | POA: Diagnosis not present

## 2023-11-27 DIAGNOSIS — N132 Hydronephrosis with renal and ureteral calculous obstruction: Secondary | ICD-10-CM | POA: Diagnosis not present

## 2023-11-27 DIAGNOSIS — F419 Anxiety disorder, unspecified: Secondary | ICD-10-CM | POA: Diagnosis not present

## 2023-11-27 DIAGNOSIS — N179 Acute kidney failure, unspecified: Secondary | ICD-10-CM | POA: Diagnosis not present

## 2023-11-27 DIAGNOSIS — N201 Calculus of ureter: Secondary | ICD-10-CM | POA: Diagnosis not present

## 2023-11-27 DIAGNOSIS — F32A Depression, unspecified: Secondary | ICD-10-CM | POA: Diagnosis not present

## 2023-11-27 DIAGNOSIS — E785 Hyperlipidemia, unspecified: Secondary | ICD-10-CM | POA: Diagnosis not present

## 2023-11-27 DIAGNOSIS — K219 Gastro-esophageal reflux disease without esophagitis: Secondary | ICD-10-CM | POA: Diagnosis not present

## 2023-11-27 DIAGNOSIS — E119 Type 2 diabetes mellitus without complications: Secondary | ICD-10-CM | POA: Diagnosis not present

## 2023-11-27 DIAGNOSIS — E669 Obesity, unspecified: Secondary | ICD-10-CM | POA: Diagnosis not present

## 2023-11-27 DIAGNOSIS — N10 Acute tubulo-interstitial nephritis: Secondary | ICD-10-CM | POA: Diagnosis not present

## 2023-11-27 DIAGNOSIS — N200 Calculus of kidney: Secondary | ICD-10-CM | POA: Diagnosis not present

## 2023-11-27 DIAGNOSIS — Z8669 Personal history of other diseases of the nervous system and sense organs: Secondary | ICD-10-CM | POA: Diagnosis not present

## 2023-11-27 DIAGNOSIS — A419 Sepsis, unspecified organism: Secondary | ICD-10-CM | POA: Diagnosis not present

## 2023-11-28 DIAGNOSIS — N10 Acute tubulo-interstitial nephritis: Secondary | ICD-10-CM | POA: Diagnosis not present

## 2023-11-28 DIAGNOSIS — B9689 Other specified bacterial agents as the cause of diseases classified elsewhere: Secondary | ICD-10-CM | POA: Diagnosis not present

## 2023-11-29 DIAGNOSIS — B9689 Other specified bacterial agents as the cause of diseases classified elsewhere: Secondary | ICD-10-CM | POA: Diagnosis not present

## 2023-11-29 DIAGNOSIS — N10 Acute tubulo-interstitial nephritis: Secondary | ICD-10-CM | POA: Diagnosis not present

## 2023-12-01 DIAGNOSIS — E119 Type 2 diabetes mellitus without complications: Secondary | ICD-10-CM | POA: Diagnosis not present

## 2023-12-01 DIAGNOSIS — Z960 Presence of urogenital implants: Secondary | ICD-10-CM | POA: Diagnosis not present

## 2023-12-01 DIAGNOSIS — N200 Calculus of kidney: Secondary | ICD-10-CM | POA: Diagnosis not present

## 2023-12-01 DIAGNOSIS — I10 Essential (primary) hypertension: Secondary | ICD-10-CM | POA: Diagnosis not present

## 2023-12-01 DIAGNOSIS — N202 Calculus of kidney with calculus of ureter: Secondary | ICD-10-CM | POA: Diagnosis not present

## 2023-12-01 DIAGNOSIS — K219 Gastro-esophageal reflux disease without esophagitis: Secondary | ICD-10-CM | POA: Diagnosis not present

## 2023-12-01 DIAGNOSIS — Z466 Encounter for fitting and adjustment of urinary device: Secondary | ICD-10-CM | POA: Diagnosis not present

## 2023-12-01 DIAGNOSIS — E669 Obesity, unspecified: Secondary | ICD-10-CM | POA: Diagnosis not present

## 2023-12-05 DIAGNOSIS — N132 Hydronephrosis with renal and ureteral calculous obstruction: Secondary | ICD-10-CM | POA: Diagnosis not present

## 2023-12-05 DIAGNOSIS — Z794 Long term (current) use of insulin: Secondary | ICD-10-CM | POA: Diagnosis not present

## 2023-12-05 DIAGNOSIS — F419 Anxiety disorder, unspecified: Secondary | ICD-10-CM | POA: Diagnosis not present

## 2023-12-05 DIAGNOSIS — N201 Calculus of ureter: Secondary | ICD-10-CM | POA: Diagnosis not present

## 2023-12-05 DIAGNOSIS — I10 Essential (primary) hypertension: Secondary | ICD-10-CM | POA: Diagnosis not present

## 2023-12-05 DIAGNOSIS — Z8669 Personal history of other diseases of the nervous system and sense organs: Secondary | ICD-10-CM | POA: Diagnosis not present

## 2023-12-05 DIAGNOSIS — N1330 Unspecified hydronephrosis: Secondary | ICD-10-CM | POA: Diagnosis not present

## 2023-12-05 DIAGNOSIS — F32A Depression, unspecified: Secondary | ICD-10-CM | POA: Diagnosis not present

## 2023-12-05 DIAGNOSIS — E119 Type 2 diabetes mellitus without complications: Secondary | ICD-10-CM | POA: Diagnosis not present

## 2023-12-05 DIAGNOSIS — E785 Hyperlipidemia, unspecified: Secondary | ICD-10-CM | POA: Diagnosis not present

## 2023-12-06 DIAGNOSIS — N201 Calculus of ureter: Secondary | ICD-10-CM | POA: Diagnosis not present

## 2023-12-07 DIAGNOSIS — N201 Calculus of ureter: Secondary | ICD-10-CM | POA: Diagnosis not present

## 2023-12-08 ENCOUNTER — Telehealth: Payer: Self-pay

## 2023-12-08 NOTE — Transitions of Care (Post Inpatient/ED Visit) (Signed)
   12/08/2023  Name: Robert Hines MRN: [id number] DOB: [birth year]  Today's TOC FU Call Status: Today's TOC FU Call Status:: Successful TOC FU Call Completed TOC FU Call Complete Date:  (Patient reviewed some information then said he was about to lie down and stated the nurses at the hospital did a very good job going over everything and declined TOC program and need for any further review including medications) Patient's Name and Date of Birth confirmed.  Transition Care Management Follow-up Telephone Call Date of Discharge: 12/07/23 Discharge Facility: Other [facility]) Name of Other (Non-[facility]) Discharge Facility: [facility] Type of Discharge: Inpatient Admission Primary Inpatient Discharge Diagnosis:: Calculus of ureter How have you been since you were released from the hospital?: Same Any questions or concerns?: No  Items Reviewed: Did you receive and understand the discharge instructions provided?: Yes Medications obtained,verified, and reconciled?: No Medications Not Reviewed Reasons:: Other: (patient states the nurse at the hospital reviewed medications very well and denied need for  review of medications) Any new allergies since your discharge?: No Dietary orders reviewed?: Yes Type of Diet Ordered:: patient states he has received lots of education on diet to reduce stones Do you have support at home?: Yes People in Home [RPT]: spouse Name of Support/Comfort Primary Source: Wife is supportive  Medications Reviewed Today: Medications Reviewed Today   Medications were not reviewed in this encounter    [facility] RN encouraged patient to schedule a hospital follow up with his PCP.  Tonia Frankel RN, CCM [place]  VBCI-Population Health RN Care Manager [phone]

## 2023-12-26 ENCOUNTER — Telehealth: Payer: Self-pay | Admitting: Sports Medicine

## 2023-12-26 NOTE — Telephone Encounter (Signed)
 Copied from CRM [id number]. Topic: Appointments - Appointment Scheduling >> Dec 26, 2023 12:02 PM Dominique A wrote: Patient/patient representative is calling to schedule an appointment. Refer to attachments for appointment information.   Mrs. Micco is calling to get the patient an appointment scheduled with Dr. ONEIDA Patient is having back pain, wanting to talk to Dr. ONEIDA about medication changes, getting a handicap sticker, and pain management. Mrs. Monterrosa states that the pain management place the patient was referred to, they never called him for an appointment.  Please call Mrs. Berntsen back [phone].  Called [facility] twice, no answer.

## 2024-01-03 ENCOUNTER — Other Ambulatory Visit: Payer: Self-pay | Admitting: Sports Medicine

## 2024-01-03 ENCOUNTER — Encounter: Payer: Self-pay | Admitting: Sports Medicine

## 2024-01-03 ENCOUNTER — Ambulatory Visit (INDEPENDENT_AMBULATORY_CARE_PROVIDER_SITE_OTHER): Admitting: Sports Medicine

## 2024-01-03 ENCOUNTER — Telehealth: Payer: Self-pay

## 2024-01-03 DIAGNOSIS — N200 Calculus of kidney: Secondary | ICD-10-CM | POA: Diagnosis not present

## 2024-01-03 DIAGNOSIS — M47816 Spondylosis without myelopathy or radiculopathy, lumbar region: Secondary | ICD-10-CM | POA: Diagnosis not present

## 2024-01-03 DIAGNOSIS — R600 Localized edema: Secondary | ICD-10-CM | POA: Diagnosis not present

## 2024-01-03 MED ORDER — ONDANSETRON HCL 8 MG PO TABS: 8.0000 mg | ORAL_TABLET | Freq: Three times a day (TID) | ORAL | 11 refills | Status: AC | PRN

## 2024-01-03 MED ORDER — LITHOLYTE 400-60 MG PO PACK: 1.0000 | PACK | Freq: Two times a day (BID) | ORAL | 11 refills | Status: DC

## 2024-01-03 MED ORDER — DOCUSATE SODIUM 100 MG PO CAPS: 100.0000 mg | ORAL_CAPSULE | Freq: Three times a day (TID) | ORAL | 3 refills | Status: DC | PRN

## 2024-01-03 MED ORDER — OXYCODONE-ACETAMINOPHEN 5-325 MG PO TABS: 1.0000 | ORAL_TABLET | Freq: Three times a day (TID) | ORAL | 0 refills | Status: AC | PRN

## 2024-01-03 MED ORDER — ZOLPIDEM TARTRATE 10 MG PO TABS: 10.0000 mg | ORAL_TABLET | Freq: Every evening | ORAL | 1 refills | Status: AC | PRN

## 2024-01-03 NOTE — Telephone Encounter (Signed)
 Copied from CRM [id number]. Topic: Clinical - Prescription Issue >> Jan 03, 2024 12:40 PM Kevelyn M wrote: Reason for CRM: Joey with [facility] calling because they can't dispense  nor order the strength Potassium Citrate-Mag Citrate (LITHOLYTE) 400-60 MG PACK. They need to know what to do. Call back # [phone]

## 2024-01-03 NOTE — Telephone Encounter (Signed)
 Patient advised.

## 2024-01-03 NOTE — Progress Notes (Signed)
    Procedures performed today:    None.  Independent interpretation of notes and tests performed by another provider:   None.  Brief History, Exam, Impression, and Recommendations:    Lumbar spondylosis Chronic axial low back pain, inoperable after consultation with neurosurgery, he has had several epidurals, facet injections, RFA, chiropractic manipulation, acupuncture, physical therapy. Nerve conduction and EMG did show radiculopathy, polyneuropathy but no evidence of a myopathy. At this point I think he needs medical pain management. We did a referral about a year ago, he will revisit this with [facility]. Adding Percocet, and I am going to decrease his gabapentin  to 600 mg nightly only.   Nephrolithiasis Recurrent nephrolithiasis, he is working with urology. Stones have been calcium  so I suspect he may need litholyte/citrate. Further management per urology.  Bilateral lower extremity edema We have suggested a compression sock Laurinda, he will work harder to do his compression socks. I have suggested not using a diuretic for now. I do not see that he has had an echo, we will go ahead and pull the trigger for this.  I spent 40 minutes of total time managing this patient today, this includes chart review, face to face, and non-face to face time.  ____________________________________________ Debby PARAS. Curtis, M.D., ABFM., CAQSM., AME. Primary Care and Sports Medicine [facility]  Adjunct Professor of [facility] Medicine  [employer]  Restaurant manager, fast food

## 2024-01-03 NOTE — Assessment & Plan Note (Signed)
 Recurrent nephrolithiasis, he is working with urology. Stones have been calcium  so I suspect he may need litholyte/citrate. Further management per urology.

## 2024-01-03 NOTE — Assessment & Plan Note (Addendum)
 Chronic axial low back pain, inoperable after consultation with neurosurgery, he has had several epidurals, facet injections, RFA, chiropractic manipulation, acupuncture, physical therapy. Nerve conduction and EMG did show radiculopathy, polyneuropathy but no evidence of a myopathy. At this point I think he needs medical pain management. We did a referral about a year ago, he will revisit this with [facility]. Adding Percocet, and I am going to decrease his gabapentin  to 600 mg nightly only.

## 2024-01-03 NOTE — Patient Instructions (Addendum)
 SABRA

## 2024-01-03 NOTE — Assessment & Plan Note (Signed)
 We have suggested a compression sock Laurinda, he will work harder to do his compression socks. I have suggested not using a diuretic for now. I do not see that he has had an echo, we will go ahead and pull the trigger for this.

## 2024-01-03 NOTE — Telephone Encounter (Signed)
 [facility] pharmacist states they cannot order the litholyte. They can only order the potassium packet in MEQ and magnesium tablets or over the counter magnesium citrate.

## 2024-01-08 ENCOUNTER — Ambulatory Visit (HOSPITAL_BASED_OUTPATIENT_CLINIC_OR_DEPARTMENT_OTHER)
Admission: RE | Admit: 2024-01-08 | Discharge: 2024-01-08 | Disposition: A | Discharge status: Home / Self Care | Source: Ambulatory Visit | Attending: Sports Medicine | Admitting: Sports Medicine

## 2024-01-08 ENCOUNTER — Other Ambulatory Visit: Payer: Self-pay | Admitting: Sports Medicine

## 2024-01-08 DIAGNOSIS — R600 Localized edema: Secondary | ICD-10-CM

## 2024-01-08 DIAGNOSIS — N200 Calculus of kidney: Secondary | ICD-10-CM

## 2024-01-08 DIAGNOSIS — R0609 Other forms of dyspnea: Secondary | ICD-10-CM | POA: Insufficient documentation

## 2024-01-08 DIAGNOSIS — M47816 Spondylosis without myelopathy or radiculopathy, lumbar region: Secondary | ICD-10-CM

## 2024-01-08 LAB — ECHOCARDIOGRAM COMPLETE
AR max vel: 2.43 cm2
AV Area VTI: 2.81 cm2
AV Area mean vel: 2.68 cm2
AV Mean grad: 4 mmHg
AV Peak grad: 7.8 mmHg
Ao pk vel: 1.4 m/s
Area-P 1/2: 3.89 cm2
Calc EF: 60.4 %
S' Lateral: 3.1 cm
Single Plane A2C EF: 58.5 %
Single Plane A4C EF: 59.2 %

## 2024-01-09 ENCOUNTER — Ambulatory Visit: Payer: Self-pay | Admitting: Sports Medicine

## 2024-01-10 DIAGNOSIS — M79675 Pain in left toe(s): Secondary | ICD-10-CM | POA: Diagnosis not present

## 2024-01-10 DIAGNOSIS — L84 Corns and callosities: Secondary | ICD-10-CM | POA: Diagnosis not present

## 2024-01-10 DIAGNOSIS — E0840 Diabetes mellitus due to underlying condition with diabetic neuropathy, unspecified: Secondary | ICD-10-CM | POA: Diagnosis not present

## 2024-01-10 DIAGNOSIS — G609 Hereditary and idiopathic neuropathy, unspecified: Secondary | ICD-10-CM | POA: Diagnosis not present

## 2024-01-10 DIAGNOSIS — M19072 Primary osteoarthritis, left ankle and foot: Secondary | ICD-10-CM | POA: Diagnosis not present

## 2024-01-10 DIAGNOSIS — B351 Tinea unguium: Secondary | ICD-10-CM | POA: Diagnosis not present

## 2024-01-10 DIAGNOSIS — I739 Peripheral vascular disease, unspecified: Secondary | ICD-10-CM | POA: Diagnosis not present

## 2024-01-13 ENCOUNTER — Other Ambulatory Visit: Payer: Self-pay | Admitting: Sports Medicine

## 2024-01-13 DIAGNOSIS — F32A Depression, unspecified: Secondary | ICD-10-CM

## 2024-01-15 ENCOUNTER — Encounter (INDEPENDENT_AMBULATORY_CARE_PROVIDER_SITE_OTHER): Payer: Self-pay | Admitting: Sports Medicine

## 2024-01-15 DIAGNOSIS — M47816 Spondylosis without myelopathy or radiculopathy, lumbar region: Secondary | ICD-10-CM | POA: Diagnosis not present

## 2024-01-15 MED ORDER — GABAPENTIN 600 MG PO TABS: ORAL_TABLET | ORAL | 3 refills | Status: DC

## 2024-01-15 NOTE — Telephone Encounter (Signed)

## 2024-01-16 ENCOUNTER — Telehealth: Payer: Self-pay

## 2024-01-16 NOTE — Telephone Encounter (Signed)
 Spoke with [facility] - pharmacist with [facility]- who states she has already spoken with another nurse in our office and nothing further is needed from our office.

## 2024-01-16 NOTE — Telephone Encounter (Signed)
 Copied from CRM [id number]. Topic: Clinical - Prescription Issue >> Jan 15, 2024  5:00 PM Kevelyn M wrote: Delon calling with [facility] pharmacy. Prescription was last called in at [facility]. Would like to confirm the dosing for gabapentin  (NEURONTIN ) 600 MG tablet  Call back [phone]

## 2024-01-16 NOTE — Telephone Encounter (Signed)
 Attempted call to pharmacy .pharmacy was closed for lunch. Will call again .

## 2024-01-18 DIAGNOSIS — G894 Chronic pain syndrome: Secondary | ICD-10-CM | POA: Diagnosis not present

## 2024-01-18 DIAGNOSIS — Z5181 Encounter for therapeutic drug level monitoring: Secondary | ICD-10-CM | POA: Diagnosis not present

## 2024-01-18 DIAGNOSIS — Z79899 Other long term (current) drug therapy: Secondary | ICD-10-CM | POA: Diagnosis not present

## 2024-01-18 DIAGNOSIS — M5416 Radiculopathy, lumbar region: Secondary | ICD-10-CM | POA: Diagnosis not present

## 2024-01-18 DIAGNOSIS — M48061 Spinal stenosis, lumbar region without neurogenic claudication: Secondary | ICD-10-CM | POA: Diagnosis not present

## 2024-01-19 ENCOUNTER — Other Ambulatory Visit: Payer: Self-pay | Admitting: Sports Medicine

## 2024-01-19 DIAGNOSIS — M47816 Spondylosis without myelopathy or radiculopathy, lumbar region: Secondary | ICD-10-CM

## 2024-01-24 DIAGNOSIS — M79604 Pain in right leg: Secondary | ICD-10-CM | POA: Diagnosis not present

## 2024-01-24 DIAGNOSIS — M79661 Pain in right lower leg: Secondary | ICD-10-CM | POA: Diagnosis not present

## 2024-01-24 DIAGNOSIS — M79605 Pain in left leg: Secondary | ICD-10-CM | POA: Diagnosis not present

## 2024-01-24 DIAGNOSIS — I83893 Varicose veins of bilateral lower extremities with other complications: Secondary | ICD-10-CM | POA: Diagnosis not present

## 2024-01-24 DIAGNOSIS — M79662 Pain in left lower leg: Secondary | ICD-10-CM | POA: Diagnosis not present

## 2024-01-25 ENCOUNTER — Encounter: Payer: Self-pay | Admitting: Sports Medicine

## 2024-01-29 DIAGNOSIS — M25552 Pain in left hip: Secondary | ICD-10-CM | POA: Diagnosis not present

## 2024-01-29 DIAGNOSIS — M7062 Trochanteric bursitis, left hip: Secondary | ICD-10-CM | POA: Diagnosis not present

## 2024-02-01 ENCOUNTER — Ambulatory Visit (INDEPENDENT_AMBULATORY_CARE_PROVIDER_SITE_OTHER): Admitting: Sports Medicine

## 2024-02-01 VITALS — BP 143/63 | HR 86 | Ht 73.0 in | Wt 236.0 lb

## 2024-02-01 DIAGNOSIS — R600 Localized edema: Secondary | ICD-10-CM | POA: Diagnosis not present

## 2024-02-01 DIAGNOSIS — E119 Type 2 diabetes mellitus without complications: Secondary | ICD-10-CM | POA: Diagnosis not present

## 2024-02-01 DIAGNOSIS — Z794 Long term (current) use of insulin: Secondary | ICD-10-CM | POA: Diagnosis not present

## 2024-02-01 DIAGNOSIS — M47816 Spondylosis without myelopathy or radiculopathy, lumbar region: Secondary | ICD-10-CM | POA: Diagnosis not present

## 2024-02-01 NOTE — Assessment & Plan Note (Signed)
 Last A1c was in May, patient on Ozempic  2 mg, A1c was well-controlled in the sixes, return to see me 6 months from now.

## 2024-02-01 NOTE — Assessment & Plan Note (Signed)
 Chronic low back pain, inoperable left consultation with neurosurgery, he has had several epidurals, facet injections, RFA, chiropractic manipulation, acupuncture, physical therapy, nerve conduction EMG did show radiculopathy, polyneuropathy but no evidence of myopathy. We referred him to medical pain management, gabapentin  was increased to twice a day and Percocet was added. He is doing okay.

## 2024-02-01 NOTE — Progress Notes (Signed)
    Procedures performed today:    None.  Independent interpretation of notes and tests performed by another provider:   None.  Brief History, Exam, Impression, and Recommendations:    Lumbar spondylosis Chronic low back pain, inoperable left consultation with neurosurgery, he has had several epidurals, facet injections, RFA, chiropractic manipulation, acupuncture, physical therapy, nerve conduction EMG did show radiculopathy, polyneuropathy but no evidence of myopathy. We referred him to medical pain management, gabapentin  was increased to twice a day and Percocet was added. He is doing okay.  Bilateral lower extremity edema Robert Hines has finally started wearing his compression socks and he does tell me he feels a lot better. He is also working with the vein clinic.  Diabetes mellitus, type 2 Last A1c was in May, patient on Ozempic  2 mg, A1c was well-controlled in the sixes, return to see me 6 months from now.    ____________________________________________ Debby PARAS. Curtis, M.D., ABFM., CAQSM., AME. Primary Care and Sports Medicine [facility]  Adjunct Professor of [facility] Medicine  [employer]  Restaurant manager, fast food

## 2024-02-01 NOTE — Assessment & Plan Note (Signed)
 Amaad has finally started wearing his compression socks and he does tell me he feels a lot better. He is also working with the vein clinic.

## 2024-02-09 ENCOUNTER — Ambulatory Visit
Admission: EM | Admit: 2024-02-09 | Discharge: 2024-02-09 | Disposition: A | Discharge status: Home / Self Care | Attending: Family Medicine | Admitting: Family Medicine

## 2024-02-09 ENCOUNTER — Ambulatory Visit

## 2024-02-09 DIAGNOSIS — W19XXXA Unspecified fall, initial encounter: Secondary | ICD-10-CM

## 2024-02-09 DIAGNOSIS — I700 Atherosclerosis of aorta: Secondary | ICD-10-CM | POA: Diagnosis not present

## 2024-02-09 DIAGNOSIS — R0781 Pleurodynia: Secondary | ICD-10-CM

## 2024-02-09 NOTE — ED Triage Notes (Signed)
 [facility] with [facility] fall on Wednesday while working in the yard. Pt reports he was bending down pulling on something in the ground to cut it loose and fell onto his right side. Pt report hx of back pain. And new onset of right sided rib pain since fall. Pt sees pain management for chronic back pain and uses hydrocodone  and has been using motrin and ice for this new acute pain.

## 2024-02-09 NOTE — Discharge Instructions (Addendum)
 X-ray of the ribs and chest done today.  Final evaluation by the radiologist does not show any rib fractures or changes to the lungs.  Symptoms and physical exam findings along with the x-ray are consistent with bruising to the rib cage.  This can be very painful and take several weeks to completely resolve.  Most importantly is taking deep breaths and avoiding short shallow breathing as this can cause fluid to collect in the lungs leading to more issues.  Can use ice on the area to help reduce pain.  May use ibuprofen to help with the pain as well.  If symptoms worsen or fail to improve then recommend returning to urgent care or following up with primary care provider

## 2024-02-09 NOTE — ED Provider Notes (Signed)
 [facility]    CSN: [id number] Arrival date & time: 02/09/24  1523      History   Chief Complaint Chief Complaint  Patient presents with   Fall    HPI Robert Hines is a 68 y.o. male.   68 year old male presents urgent care with complaints of right rib pain he reports on Wednesday he was bending over working in the garden and fell over.  He landed on his right side he immediately started having pain along his side.  It is more painful when he takes in a deep breath.  He denies any shortness of breath or difficulty breathing.  He denies any history of injury to the area.  He did not have any other injury during the fall   Fall Pertinent negatives include no chest pain, no abdominal pain and no shortness of breath.    Past Medical History:  Diagnosis Date   Arthritis    neck   COVID-19    Depression    Diabetes mellitus without complication (HCC)    type 2   Hyperlipidemia    Hypertension    Pneumonia    covid pneumonia and covid july 2020, d/ced july 16th from [facility] stayed 9 days   Renal disorder    calculi, multiple lithotripsies, stents   Right cataract    Rotator cuff disorder    Spinal pain     Patient Active Problem List   Diagnosis Date Noted   Bilateral lower extremity edema 01/03/2024   Infrapatellar bursitis of right knee 06/07/2023   Chronic diarrhea 04/05/2022   Mild cognitive impairment 11/22/2021   Benign essential tremor 06/23/2021   Excessive daytime sleepiness 05/26/2021   Diabetic neuropathy (HCC) 04/30/2021   Lumbar spondylosis 04/30/2021   Diabetic ulcer of left foot (HCC) 04/28/2021   Primary osteoarthritis of left hip 04/28/2021   Screening for AAA (abdominal aortic aneurysm) 08/14/2020   Thrombocytopenia (HCC) 02/03/2019   GERD without esophagitis 12/25/2018   Pneumonia due to COVID-19 virus 12/20/2018   Combined forms of age-related cataract of left eye 08/02/2018   Mass of left thigh 05/24/2018   History  of DVT (deep vein thrombosis) 05/09/2018   Primary insomnia 10/30/2017   S/P cervical spinal fusion 09/28/2017   Left shoulder pain 01/05/2017   Depression 05/02/2016   Obesity 01/23/2015   Right shoulder pain 01/23/2015   Former smoker 08/28/2014   Nephrolithiasis 07/25/2014   Diabetes mellitus, type 2 (HCC) 07/25/2014   Essential hypertension, benign 07/25/2014   Annual physical exam 07/25/2014   Hyperlipidemia 07/25/2014    Past Surgical History:  Procedure Laterality Date   ANTERIOR FUSION CERVICAL SPINE  2007   ARTHOSCOPIC ROTAOR CUFF REPAIR Left 03/21/2019   Procedure: ARTHROSCOPIC ROTATOR CUFF REPAIR;  Surgeon: Dozier Soulier, MD;  Location: [facility] [place] [facility];  Service: Orthopedics;  Laterality: Left;   CYSTOSCOPY/RETROGRADE/URETEROSCOPY/STONE EXTRACTION WITH BASKET  08/2021   EYE SURGERY Left    cataract   FOOT SURGERY Right    pin placed and removed   HERNIA REPAIR     inguinal   LITHOTRIPSY     several   SHOULDER ARTHROSCOPY WITH ROTATOR CUFF REPAIR AND SUBACROMIAL DECOMPRESSION Right 12/28/2015   Procedure: SHOULDER ARTHROSCOPY TAKE DOWN ROTATOR CUFF REPAIR AND SUBACROMIAL DECOMPRESSION, DEBRIDEMENT OF SLAP TEAR;  Surgeon: Soulier Dozier, MD;  Location: [facility];  Service: Orthopedics;  Laterality: Right;  SHOULDER ARTHROSCOPY TAKE DOWN ROTATOR CUFF REPAIR AND SUBACROMIAL DECOMPRESSION   SHOULDER ARTHROSCOPY WITH SUBACROMIAL  DECOMPRESSION Left 03/21/2019   Procedure: SHOULDER ARTHROSCOPY WITH SUBACROMIAL DECOMPRESSION;  Surgeon: Dozier Soulier, MD;  Location: [facility];  Service: Orthopedics;  Laterality: Left;   VASECTOMY Bilateral        Home Medications    Prior to Admission medications   Medication Sig Start Date End Date Taking? Authorizing Provider  aspirin  81 MG chewable tablet Chew by mouth daily.    [provider]  buPROPion  (WELLBUTRIN  XL) 300 MG 24 hr tablet TAKE 1 TABLET BY MOUTH DAILY 01/15/24    Curtis Debby PARAS, MD  docusate sodium  (COLACE) 100 MG capsule Take 1 capsule (100 mg total) by mouth 3 (three) times daily as needed. 01/03/24   Curtis Debby PARAS, MD  gabapentin  (NEURONTIN ) 600 MG tablet Take 2 tablets (1,200 mg total) by mouth 2 (two) times daily. 01/19/24   Curtis Debby PARAS, MD  glipiZIDE  (GLUCOTROL ) 5 MG tablet TAKE 1 TABLET BY MOUTH DAILY BEFORE BREAKFAST 10/26/23   Curtis Debby PARAS, MD  Insulin  Glargine (BASAGLAR  KWIKPEN) 100 UNIT/ML INJECT 94 UNITS UNDER THE SKIN EVERY NIGHT AT BEDTIME 10/26/23   Curtis Debby PARAS, MD  Insulin  Pen Needle 32G X 4 MM MISC Use as needed with insulin  06/07/23   Curtis Debby PARAS, MD  lisinopril  (ZESTRIL ) 40 MG tablet TAKE 1 TABLET BY MOUTH DAILY 06/29/23   Curtis Debby PARAS, MD  metFORMIN  (GLUCOPHAGE ) 1000 MG tablet TAKE 1 TABLET BY MOUTH TWICE A DAY WITH A MEAL 09/21/23   Curtis Debby PARAS, MD  ondansetron  (ZOFRAN ) 8 MG tablet Take 1 tablet (8 mg total) by mouth every 8 (eight) hours as needed for nausea or vomiting. 01/03/24   Curtis Debby PARAS, MD  oxyCODONE -acetaminophen  (PERCOCET/ROXICET) 5-325 MG tablet Take 1 tablet by mouth every 8 (eight) hours as needed. 01/03/24   Curtis Debby PARAS, MD  OZEMPIC , 2 MG/DOSE, 8 MG/3ML SOPN DIAL  AND INJECT UNDER THE SKIN 2 MG WEEKLY 11/24/23   Thekkekandam, Thomas J, MD  potassium chloride (KLOR-CON) 20 MEQ packet TAKE 1 PACKET BY MOUTH EVERY MORNING AND AT BEDTIME 01/03/24   Curtis Debby PARAS, MD  Probiotic Product (PROBIOTIC-10 PO) Take by mouth.    [provider]  simvastatin  (ZOCOR ) 40 MG tablet TAKE 1 TABLET BY MOUTH DAILY 05/02/23   Curtis Debby PARAS, MD  tamsulosin (FLOMAX) 0.4 MG CAPS capsule Take 0.4 mg by mouth daily. 07/12/21   [provider]  zolpidem  (AMBIEN ) 10 MG tablet Take 1 tablet (10 mg total) by mouth at bedtime as needed. for sleep 01/03/24   Curtis Debby PARAS, MD    Family History Family History  Problem  Relation Age of Onset   Hypertension Mother    Heart failure Father    Diabetes Father    Hypertension Father    Heart failure Sister    Hypertension Sister     Social History Social History   Tobacco Use   Smoking status: Former    Current packs/day: 0.00    Average packs/day: 1 pack/day for 20.0 years (20.0 ttl pk-yrs)    Types: Cigarettes    Start date: 10/01/1998    Quit date: 10/01/2018    Years since quitting: 5.3   Smokeless tobacco: Never  Vaping Use   Vaping status: Never Used  Substance Use Topics   Alcohol use: Yes    Alcohol/week: 0.0 standard drinks of alcohol    Comment: social   Drug use: No     Allergies   Augmentin [amoxicillin-pot clavulanate], Codeine, and  Duloxetine   Review of Systems Review of Systems  Constitutional:  Negative for chills and fever.  HENT:  Negative for ear pain and sore throat.   Eyes:  Negative for pain and visual disturbance.  Respiratory:  Negative for cough and shortness of breath.        Right rib pain   Cardiovascular:  Negative for chest pain and palpitations.  Gastrointestinal:  Negative for abdominal pain and vomiting.  Genitourinary:  Negative for dysuria and hematuria.  Musculoskeletal:  Negative for arthralgias and back pain.  Skin:  Negative for color change and rash.  Neurological:  Negative for seizures and syncope.  All other systems reviewed and are negative.    Physical Exam Triage Vital Signs ED Triage Vitals [02/09/24 1535]  Encounter Vitals Group     BP (!) 144/77     Girls Systolic BP Percentile      Girls Diastolic BP Percentile      Boys Systolic BP Percentile      Boys Diastolic BP Percentile      Pulse Rate 94     Resp 19     Temp 97.6 F (36.4 C)     Temp src      SpO2 95 %     Weight      Height      Head Circumference      Peak Flow      Pain Score 7     Pain Loc      Pain Education      Exclude from Growth Chart    No data found.  Updated Vital Signs BP (!) 144/77    Pulse 94   Temp 97.6 F (36.4 C)   Resp 19   SpO2 95%   Visual Acuity Right Eye Distance:   Left Eye Distance:   Bilateral Distance:    Right Eye Near:   Left Eye Near:    Bilateral Near:     Physical Exam Vitals and nursing note reviewed.  Constitutional:      General: He is not in acute distress.    Appearance: He is well-developed.  HENT:     Head: Normocephalic and atraumatic.  Eyes:     Conjunctiva/sclera: Conjunctivae normal.  Cardiovascular:     Rate and Rhythm: Normal rate and regular rhythm.     Heart sounds: No murmur heard. Pulmonary:     Effort: Pulmonary effort is normal. No respiratory distress.     Breath sounds: Normal breath sounds.  Chest:    Abdominal:     Palpations: Abdomen is soft.     Tenderness: There is no abdominal tenderness.  Musculoskeletal:        General: No swelling.     Cervical back: Neck supple.  Skin:    General: Skin is warm and dry.     Capillary Refill: Capillary refill takes less than 2 seconds.  Neurological:     Mental Status: He is alert.  Psychiatric:        Mood and Affect: Mood normal.      UC Treatments / Results  Labs (all labs ordered are listed, but only abnormal results are displayed) Labs Reviewed - No data to display  EKG   Radiology DG Ribs Unilateral W/Chest Right Result Date: 02/09/2024 CLINICAL DATA:  Right rib pain secondary to fall EXAM: RIGHT RIBS AND CHEST - 3+ VIEW COMPARISON:  Oct 25, 2022 FINDINGS: Mild elevation the right hemidiaphragm, unchanged. No focal airspace consolidation, pleural effusion,  or pneumothorax. No cardiomegaly. Aortic atherosclerosis. No acute, displaced rib fracture or destructive lesion. Multilevel thoracic osteophytosis. Cervical fusion hardware. IMPRESSION: No acute, displaced rib fracture. Otherwise, clear lungs. Electronically Signed   By: Rogelia Myers M.D.   On: 02/09/2024 16:25    Procedures Procedures (including critical care time)  Medications Ordered in  UC Medications - No data to display  Initial Impression / Assessment and Plan / UC Course  I have reviewed the triage vital signs and the nursing notes.  Pertinent labs & imaging results that were available during my care of the patient were reviewed by me and considered in my medical decision making (see chart for details).     Rib pain on right side - Plan: DG Ribs Unilateral W/Chest Right, DG Ribs Unilateral W/Chest Right  X-ray of the ribs and chest done today.  Final evaluation by the radiologist does not show any rib fractures or changes to the lungs.  Symptoms and physical exam findings along with the x-ray are consistent with bruising to the rib cage.  This can be very painful and take several weeks to completely resolve.  Most importantly is taking deep breaths and avoiding short shallow breathing as this can cause fluid to collect in the lungs leading to more issues.  Can use ice on the area to help reduce pain.  May use ibuprofen to help with the pain as well.  If symptoms worsen or fail to improve then recommend returning to urgent care or following up with primary care provider   Final Clinical Impressions(s) / UC Diagnoses   Final diagnoses:  Rib pain on right side     Discharge Instructions      X-ray of the ribs and chest done today.  Final evaluation by the radiologist does not show any rib fractures or changes to the lungs.  Symptoms and physical exam findings along with the x-ray are consistent with bruising to the rib cage.  This can be very painful and take several weeks to completely resolve.  Most importantly is taking deep breaths and avoiding short shallow breathing as this can cause fluid to collect in the lungs leading to more issues.  Can use ice on the area to help reduce pain.  May use ibuprofen to help with the pain as well.  If symptoms worsen or fail to improve then recommend returning to urgent care or following up with primary care provider     ED  Prescriptions   None    PDMP not reviewed this encounter.   Teresa Almarie LABOR, PA-C 02/09/24 1646

## 2024-02-20 ENCOUNTER — Encounter: Payer: Self-pay | Admitting: Sports Medicine

## 2024-02-22 DIAGNOSIS — G894 Chronic pain syndrome: Secondary | ICD-10-CM | POA: Diagnosis not present

## 2024-02-22 DIAGNOSIS — Z5181 Encounter for therapeutic drug level monitoring: Secondary | ICD-10-CM | POA: Diagnosis not present

## 2024-02-22 DIAGNOSIS — M5416 Radiculopathy, lumbar region: Secondary | ICD-10-CM | POA: Diagnosis not present

## 2024-02-22 DIAGNOSIS — Z79899 Other long term (current) drug therapy: Secondary | ICD-10-CM | POA: Diagnosis not present

## 2024-02-22 DIAGNOSIS — M48061 Spinal stenosis, lumbar region without neurogenic claudication: Secondary | ICD-10-CM | POA: Diagnosis not present

## 2024-02-29 DIAGNOSIS — Z961 Presence of intraocular lens: Secondary | ICD-10-CM | POA: Diagnosis not present

## 2024-02-29 DIAGNOSIS — Z135 Encounter for screening for eye and ear disorders: Secondary | ICD-10-CM | POA: Diagnosis not present

## 2024-02-29 DIAGNOSIS — H524 Presbyopia: Secondary | ICD-10-CM | POA: Diagnosis not present

## 2024-02-29 DIAGNOSIS — E119 Type 2 diabetes mellitus without complications: Secondary | ICD-10-CM | POA: Diagnosis not present

## 2024-02-29 LAB — HM DIABETES EYE EXAM

## 2024-03-27 DIAGNOSIS — M48061 Spinal stenosis, lumbar region without neurogenic claudication: Secondary | ICD-10-CM | POA: Diagnosis not present

## 2024-03-27 DIAGNOSIS — I10 Essential (primary) hypertension: Secondary | ICD-10-CM | POA: Diagnosis not present

## 2024-03-27 DIAGNOSIS — N401 Enlarged prostate with lower urinary tract symptoms: Secondary | ICD-10-CM | POA: Diagnosis not present

## 2024-03-27 DIAGNOSIS — N200 Calculus of kidney: Secondary | ICD-10-CM | POA: Diagnosis not present

## 2024-03-27 DIAGNOSIS — R3912 Poor urinary stream: Secondary | ICD-10-CM | POA: Diagnosis not present

## 2024-04-02 DIAGNOSIS — N281 Cyst of kidney, acquired: Secondary | ICD-10-CM | POA: Diagnosis not present

## 2024-04-15 DIAGNOSIS — G894 Chronic pain syndrome: Secondary | ICD-10-CM | POA: Diagnosis not present

## 2024-04-15 DIAGNOSIS — M48061 Spinal stenosis, lumbar region without neurogenic claudication: Secondary | ICD-10-CM | POA: Diagnosis not present

## 2024-04-15 DIAGNOSIS — M5416 Radiculopathy, lumbar region: Secondary | ICD-10-CM | POA: Diagnosis not present

## 2024-04-19 ENCOUNTER — Ambulatory Visit
Admission: EM | Admit: 2024-04-19 | Discharge: 2024-04-19 | Disposition: A | Discharge status: Home / Self Care | Attending: Family Medicine | Admitting: Family Medicine

## 2024-04-19 DIAGNOSIS — Z20822 Contact with and (suspected) exposure to covid-19: Secondary | ICD-10-CM

## 2024-04-19 DIAGNOSIS — J029 Acute pharyngitis, unspecified: Secondary | ICD-10-CM | POA: Diagnosis not present

## 2024-04-19 LAB — POC SOFIA SARS ANTIGEN FIA: SARS Coronavirus 2 Ag: NEGATIVE

## 2024-04-19 MED ORDER — PREDNISONE 20 MG PO TABS: ORAL_TABLET | ORAL | 0 refills | Status: DC

## 2024-04-19 NOTE — Discharge Instructions (Addendum)
 Advised patient COVID-19 was negative today.  Advised patient to check home test in the next 2 to 3 days if positive we will call in Paxlovid .  Advised if symptoms worsen and/or unresolved please follow-up with your PCP or here for further evaluation.

## 2024-04-19 NOTE — ED Triage Notes (Addendum)
 Pt c/o sore throat, cough, congestion and HA x 3 days. Wife tested pos for COVID today. Taking OTC cough and cold prn. Was hospitalized for 9 days in 2020 w covid.

## 2024-04-19 NOTE — ED Provider Notes (Signed)
 Robert Hines CARE    CSN: [id number] Arrival date & time: 04/19/24  1439      History   Chief Complaint Chief Complaint  Patient presents with   Covid Exposure   Nasal Congestion   Generalized Body Aches    HPI Robert Hines is a 68 y.o. male.   HPI Very pleasant 68 year old male presents with sore throat, cough, congestion with headaches for 3 days.  Patient's wife tested positive for COVID-19 earlier today and was evaluated by me.  PMH significant for T2DM without complication, HTN, and history of DVT.  Past Medical History:  Diagnosis Date   Arthritis    neck   COVID-19    Depression    Diabetes mellitus without complication (HCC)    type 2   Hyperlipidemia    Hypertension    Pneumonia    covid pneumonia and covid july 2020, d/ced july 16th from [facility] stayed 9 days   Renal disorder    calculi, multiple lithotripsies, stents   Right cataract    Rotator cuff disorder    Spinal pain     Patient Active Problem List   Diagnosis Date Noted   Bilateral lower extremity edema 01/03/2024   Infrapatellar bursitis of right knee 06/07/2023   Chronic diarrhea 04/05/2022   Mild cognitive impairment 11/22/2021   Benign essential tremor 06/23/2021   Excessive daytime sleepiness 05/26/2021   Diabetic neuropathy (HCC) 04/30/2021   Lumbar spondylosis 04/30/2021   Diabetic ulcer of left foot (HCC) 04/28/2021   Primary osteoarthritis of left hip 04/28/2021   Screening for AAA (abdominal aortic aneurysm) 08/14/2020   Thrombocytopenia 02/03/2019   GERD without esophagitis 12/25/2018   Pneumonia due to COVID-19 virus 12/20/2018   Combined forms of age-related cataract of left eye 08/02/2018   Mass of left thigh 05/24/2018   History of DVT (deep vein thrombosis) 05/09/2018   Primary insomnia 10/30/2017   S/P cervical spinal fusion 09/28/2017   Left shoulder pain 01/05/2017   Depression 05/02/2016   Obesity 01/23/2015   Right shoulder pain 01/23/2015    Former smoker 08/28/2014   Nephrolithiasis 07/25/2014   Diabetes mellitus, type 2 (HCC) 07/25/2014   Essential hypertension, benign 07/25/2014   Annual physical exam 07/25/2014   Hyperlipidemia 07/25/2014    Past Surgical History:  Procedure Laterality Date   ANTERIOR FUSION CERVICAL SPINE  2007   ARTHOSCOPIC ROTAOR CUFF REPAIR Left 03/21/2019   Procedure: ARTHROSCOPIC ROTATOR CUFF REPAIR;  Surgeon: Dozier Soulier, MD;  Location: [facility];  Service: Orthopedics;  Laterality: Left;   CYSTOSCOPY/RETROGRADE/URETEROSCOPY/STONE EXTRACTION WITH BASKET  08/2021   EYE SURGERY Left    cataract   FOOT SURGERY Right    pin placed and removed   HERNIA REPAIR     inguinal   LITHOTRIPSY     several   SHOULDER ARTHROSCOPY WITH ROTATOR CUFF REPAIR AND SUBACROMIAL DECOMPRESSION Right 12/28/2015   Procedure: SHOULDER ARTHROSCOPY TAKE DOWN ROTATOR CUFF REPAIR AND SUBACROMIAL DECOMPRESSION, DEBRIDEMENT OF SLAP TEAR;  Surgeon: Soulier Dozier, MD;  Location: [facility];  Service: Orthopedics;  Laterality: Right;  SHOULDER ARTHROSCOPY TAKE DOWN ROTATOR CUFF REPAIR AND SUBACROMIAL DECOMPRESSION   SHOULDER ARTHROSCOPY WITH SUBACROMIAL DECOMPRESSION Left 03/21/2019   Procedure: SHOULDER ARTHROSCOPY WITH SUBACROMIAL DECOMPRESSION;  Surgeon: Dozier Soulier, MD;  Location: [facility];  Service: Orthopedics;  Laterality: Left;   VASECTOMY Bilateral        Home Medications    Prior to Admission medications   Medication Sig  Start Date End Date Taking? Authorizing Provider  predniSONE  (DELTASONE ) 20 MG tablet Take 3 tabs PO daily x 5 days. 04/19/24  Yes Teddy Sharper, FNP  aspirin  81 MG chewable tablet Chew by mouth daily.    [provider]  buPROPion  (WELLBUTRIN  XL) 300 MG 24 hr tablet TAKE 1 TABLET BY MOUTH DAILY 01/15/24   Curtis Debby PARAS, MD  docusate sodium  (COLACE) 100 MG capsule Take 1 capsule (100 mg total) by mouth 3 (three) times  daily as needed. 01/03/24   Curtis Debby PARAS, MD  gabapentin  (NEURONTIN ) 600 MG tablet Take 2 tablets (1,200 mg total) by mouth 2 (two) times daily. 01/19/24   Curtis Debby PARAS, MD  glipiZIDE  (GLUCOTROL ) 5 MG tablet TAKE 1 TABLET BY MOUTH DAILY BEFORE BREAKFAST 10/26/23   Curtis Debby PARAS, MD  Insulin  Glargine (BASAGLAR  KWIKPEN) 100 UNIT/ML INJECT 94 UNITS UNDER THE SKIN EVERY NIGHT AT BEDTIME 10/26/23   Curtis Debby PARAS, MD  Insulin  Pen Needle 32G X 4 MM MISC Use as needed with insulin  06/07/23   Curtis Debby PARAS, MD  lisinopril  (ZESTRIL ) 40 MG tablet TAKE 1 TABLET BY MOUTH DAILY 06/29/23   Curtis Debby PARAS, MD  metFORMIN  (GLUCOPHAGE ) 1000 MG tablet TAKE 1 TABLET BY MOUTH TWICE A DAY WITH A MEAL 09/21/23   Curtis Debby PARAS, MD  ondansetron  (ZOFRAN ) 8 MG tablet Take 1 tablet (8 mg total) by mouth every 8 (eight) hours as needed for nausea or vomiting. 01/03/24   Curtis Debby PARAS, MD  oxyCODONE -acetaminophen  (PERCOCET/ROXICET) 5-325 MG tablet Take 1 tablet by mouth every 8 (eight) hours as needed. 01/03/24   Curtis Debby PARAS, MD  OZEMPIC , 2 MG/DOSE, 8 MG/3ML SOPN DIAL  AND INJECT UNDER THE SKIN 2 MG WEEKLY 11/24/23   Thekkekandam, Thomas J, MD  potassium chloride (KLOR-CON) 20 MEQ packet TAKE 1 PACKET BY MOUTH EVERY MORNING AND AT BEDTIME 01/03/24   Curtis Debby PARAS, MD  Probiotic Product (PROBIOTIC-10 PO) Take by mouth.    [provider]  simvastatin  (ZOCOR ) 40 MG tablet TAKE 1 TABLET BY MOUTH DAILY 05/02/23   Curtis Debby PARAS, MD  tamsulosin (FLOMAX) 0.4 MG CAPS capsule Take 0.4 mg by mouth daily. 07/12/21   [provider]  zolpidem  (AMBIEN ) 10 MG tablet Take 1 tablet (10 mg total) by mouth at bedtime as needed. for sleep 01/03/24   Curtis Debby PARAS, MD    Family History Family History  Problem Relation Age of Onset   Hypertension Mother    Heart failure Father    Diabetes Father    Hypertension Father    Heart failure  Sister    Hypertension Sister     Social History Social History   Tobacco Use   Smoking status: Former    Current packs/day: 0.00    Average packs/day: 1 pack/day for 20.0 years (20.0 ttl pk-yrs)    Types: Cigarettes    Start date: 10/01/1998    Quit date: 10/01/2018    Years since quitting: 5.5   Smokeless tobacco: Never  Vaping Use   Vaping status: Never Used  Substance Use Topics   Alcohol use: Yes    Alcohol/week: 0.0 standard drinks of alcohol    Comment: social   Drug use: No     Allergies   Augmentin [amoxicillin-pot clavulanate], Codeine, and Duloxetine   Review of Systems Review of Systems  HENT:  Positive for congestion and sore throat.   Respiratory:  Positive for cough.   All other systems reviewed and  are negative.    Physical Exam Triage Vital Signs ED Triage Vitals  Encounter Vitals Group     BP 04/19/24 1538 127/71     Girls Systolic BP Percentile --      Girls Diastolic BP Percentile --      Boys Systolic BP Percentile --      Boys Diastolic BP Percentile --      Pulse Rate 04/19/24 1538 (!) 106     Resp 04/19/24 1538 17     Temp 04/19/24 1538 (!) 97.4 F (36.3 C)     Temp Source 04/19/24 1538 Oral     SpO2 04/19/24 1538 96 %     Weight --      Height --      Head Circumference --      Peak Flow --      Pain Score 04/19/24 1539 0     Pain Loc --      Pain Education --      Exclude from Growth Chart --    No data found.  Updated Vital Signs BP 127/71 (BP Location: Right Arm)   Pulse (!) 106   Temp (!) 97.4 F (36.3 C) (Oral)   Resp 17   SpO2 96%    Physical Exam Vitals and nursing note reviewed.  Constitutional:      General: He is not in acute distress.    Appearance: Normal appearance. He is normal weight. He is not ill-appearing.  HENT:     Head: Normocephalic and atraumatic.     Right Ear: Tympanic membrane, ear canal and external ear normal.     Left Ear: Tympanic membrane, ear canal and external ear normal.      Mouth/Throat:     Mouth: Mucous membranes are moist.     Pharynx: Oropharynx is clear.  Eyes:     Extraocular Movements: Extraocular movements intact.     Conjunctiva/sclera: Conjunctivae normal.     Pupils: Pupils are equal, round, and reactive to light.  Cardiovascular:     Rate and Rhythm: Normal rate and regular rhythm.     Pulses: Normal pulses.     Heart sounds: Normal heart sounds.  Pulmonary:     Effort: Pulmonary effort is normal.     Breath sounds: Normal breath sounds. No wheezing, rhonchi or rales.  Musculoskeletal:        General: Normal range of motion.  Skin:    General: Skin is warm and dry.  Neurological:     General: No focal deficit present.     Mental Status: He is alert and oriented to person, place, and time. Mental status is at baseline.  Psychiatric:        Mood and Affect: Mood normal.        Behavior: Behavior normal.      UC Treatments / Results  Labs (all labs ordered are listed, but only abnormal results are displayed) Labs Reviewed  POC SOFIA SARS ANTIGEN FIA - Normal    EKG   Radiology No results found.  Procedures Procedures (including critical care time)  Medications Ordered in UC Medications - No data to display  Initial Impression / Assessment and Plan / UC Course  I have reviewed the triage vital signs and the nursing notes.  Pertinent labs & imaging results that were available during my care of the patient were reviewed by me and considered in my medical decision making (see chart for details).     MDM: 1.  Exposure to COVID-19 virus-COVID-19 negative this afternoon patient advised; 2.  Sore throat-Rx'd prednisone  20 mg tablet: Take 3 tabs p.o. daily x 5 days. Advised patient COVID-19 was negative today.  Advised patient to check home test in the next 2 to 3 days if positive we will call in Paxlovid  (GFR 73 as of CMP on 12/07/2023).  Advised if symptoms worsen and/or unresolved please follow-up with your PCP or here for further  evaluation.  Final Clinical Impressions(s) / UC Diagnoses   Final diagnoses:  Exposure to COVID-19 virus  Sore throat     Discharge Instructions      Advised patient COVID-19 was negative today.  Advised patient to check home test in the next 2 to 3 days if positive we will call in Paxlovid .  Advised if symptoms worsen and/or unresolved please follow-up with your PCP or here for further evaluation.     ED Prescriptions     Medication Sig Dispense Auth. Provider   predniSONE  (DELTASONE ) 20 MG tablet Take 3 tabs PO daily x 5 days. 15 tablet Ryota Treece, FNP      PDMP not reviewed this encounter.   Teddy Sharper, FNP 04/19/24 [phone]

## 2024-04-23 ENCOUNTER — Telehealth: Payer: Self-pay | Admitting: Emergency Medicine

## 2024-04-23 ENCOUNTER — Telehealth: Payer: Self-pay

## 2024-04-23 MED ORDER — PAXLOVID (300/100) 20 X 150 MG & 10 X 100MG PO TBPK: ORAL_TABLET | ORAL | 0 refills | Status: DC

## 2024-04-23 NOTE — Telephone Encounter (Signed)
 Patient called stating the he tested positive for COVID on 04/21/2024.  Per Nena note, if positive Paxlovid  will be sent into pharmacy.  Will send to Dr Maranda.

## 2024-04-23 NOTE — Telephone Encounter (Signed)
 Copied from CRM [id number]. Topic: Clinical - Medical Advice >> Apr 22, 2024  3:26 PM Anairis L wrote: Reason for CRM: Patient was told to take a home COVID test and if it was positive to call back.He received a positive on Friday 04/21/2024   Please give patient a call.

## 2024-04-23 NOTE — Telephone Encounter (Signed)
 Patient informed of Dr Ceola recommendation and instructed to hold Simvastatin  for 5 days while on the Paxlovid .  Patient voices understanding.

## 2024-04-23 NOTE — Telephone Encounter (Signed)
 Patient had a positive COVID exposure.  Was seen here on 04/19/2024 and had a negative COVID test.  At that time he was on his 2nd or 3rd day of illness.  He calls today saying he has tested positive for COVID.  He wishes Paxlovid .  This is likely past the 5 days recommended for starting Paxlovid .  Patient does have a history of a prolonged hospitalization because of COVID a couple years back.  He will be provided with his prescription with the cautions that it may not be as effective when started late in the course.  His most recent GFR is 74.  He is in a risk category where Paxlovid  is recommended.

## 2024-04-23 NOTE — Telephone Encounter (Signed)
 Patient was seen in [facility] 04/19/2024 discharge note stated, Advised patient COVID-19 was negative today. Advised patient to check home test in the next 2 to 3 days if positive we will call in Paxlovid . Advised if symptoms worsen and/or unresolved please follow-up with your PCP or here for further evaluation.   Prednisone  was given at [facility] 04/19/2024.  Patient reached out to our office 04/22/2024 to advise he has received a positive COVID-19 at home test on 04/21/2024. Forwarding message to covering provider, Alvia, please advise.

## 2024-04-24 NOTE — Telephone Encounter (Signed)
 UC has provided patient with Paxlovid  04/23/2024.

## 2024-04-25 ENCOUNTER — Telehealth: Payer: Self-pay

## 2024-04-25 NOTE — Telephone Encounter (Signed)
 Copied from CRM #[id number]. Topic: General - Other >> Apr 24, 2024  5:04 PM Robert Hines wrote: Reason for CRM: Patient returning missed call - advised was just about the paxlovid . Patient states he has been taking medication and has seen some improvement, Will reach out if he needs anything.

## 2024-04-28 DIAGNOSIS — Z87891 Personal history of nicotine dependence: Secondary | ICD-10-CM | POA: Diagnosis not present

## 2024-04-28 DIAGNOSIS — Z87442 Personal history of urinary calculi: Secondary | ICD-10-CM | POA: Diagnosis not present

## 2024-04-28 DIAGNOSIS — N1330 Unspecified hydronephrosis: Secondary | ICD-10-CM | POA: Diagnosis not present

## 2024-04-28 DIAGNOSIS — D72829 Elevated white blood cell count, unspecified: Secondary | ICD-10-CM | POA: Diagnosis not present

## 2024-04-28 DIAGNOSIS — Z9889 Other specified postprocedural states: Secondary | ICD-10-CM | POA: Diagnosis not present

## 2024-04-28 DIAGNOSIS — N210 Calculus in bladder: Secondary | ICD-10-CM | POA: Diagnosis not present

## 2024-04-28 DIAGNOSIS — N200 Calculus of kidney: Secondary | ICD-10-CM | POA: Diagnosis not present

## 2024-04-28 DIAGNOSIS — R10A2 Flank pain, left side: Secondary | ICD-10-CM | POA: Diagnosis not present

## 2024-04-28 DIAGNOSIS — N132 Hydronephrosis with renal and ureteral calculous obstruction: Secondary | ICD-10-CM | POA: Diagnosis not present

## 2024-04-30 DIAGNOSIS — G894 Chronic pain syndrome: Secondary | ICD-10-CM | POA: Diagnosis not present

## 2024-05-01 ENCOUNTER — Other Ambulatory Visit: Payer: Self-pay

## 2024-05-01 DIAGNOSIS — E119 Type 2 diabetes mellitus without complications: Secondary | ICD-10-CM

## 2024-05-01 DIAGNOSIS — N200 Calculus of kidney: Secondary | ICD-10-CM | POA: Diagnosis not present

## 2024-05-01 MED ORDER — GLIPIZIDE 5 MG PO TABS: 5.0000 mg | ORAL_TABLET | Freq: Every day | ORAL | 0 refills | Status: AC

## 2024-05-23 ENCOUNTER — Ambulatory Visit: Admitting: Medical-Surgical

## 2024-05-23 ENCOUNTER — Encounter: Payer: Self-pay | Admitting: Medical-Surgical

## 2024-05-23 VITALS — BP 126/67 | HR 77 | Resp 20 | Ht 73.0 in | Wt 234.0 lb

## 2024-05-23 DIAGNOSIS — N200 Calculus of kidney: Secondary | ICD-10-CM | POA: Diagnosis not present

## 2024-05-23 DIAGNOSIS — R600 Localized edema: Secondary | ICD-10-CM

## 2024-05-23 DIAGNOSIS — E119 Type 2 diabetes mellitus without complications: Secondary | ICD-10-CM | POA: Diagnosis not present

## 2024-05-23 DIAGNOSIS — F5101 Primary insomnia: Secondary | ICD-10-CM | POA: Diagnosis not present

## 2024-05-23 DIAGNOSIS — F32A Depression, unspecified: Secondary | ICD-10-CM

## 2024-05-23 DIAGNOSIS — M47816 Spondylosis without myelopathy or radiculopathy, lumbar region: Secondary | ICD-10-CM

## 2024-05-23 DIAGNOSIS — E782 Mixed hyperlipidemia: Secondary | ICD-10-CM

## 2024-05-23 DIAGNOSIS — Z794 Long term (current) use of insulin: Secondary | ICD-10-CM | POA: Diagnosis not present

## 2024-05-23 DIAGNOSIS — I10 Essential (primary) hypertension: Secondary | ICD-10-CM | POA: Diagnosis not present

## 2024-05-23 DIAGNOSIS — E1142 Type 2 diabetes mellitus with diabetic polyneuropathy: Secondary | ICD-10-CM

## 2024-05-23 LAB — POCT GLYCOSYLATED HEMOGLOBIN (HGB A1C)
HbA1c, POC (controlled diabetic range): 7 % (ref 0.0–7.0)
Hemoglobin A1C: 7 % — AB (ref 4.0–5.6)

## 2024-05-23 MED ORDER — SIMVASTATIN 40 MG PO TABS: 40.0000 mg | ORAL_TABLET | Freq: Every day | ORAL | 3 refills | Status: AC

## 2024-05-23 MED ORDER — LANTUS SOLOSTAR 100 UNIT/ML ~~LOC~~ SOPN: 93.0000 [IU] | PEN_INJECTOR | Freq: Every day | SUBCUTANEOUS | 11 refills | Status: AC

## 2024-05-23 NOTE — Assessment & Plan Note (Signed)
 Long term treatment with Ambien . Now only using 5mg  nightly which is effective and well tolerated.  - Continue Ambien  5mg  nightly.

## 2024-05-23 NOTE — Assessment & Plan Note (Signed)
 Recent passage of 4 mm stone. Managed with tamsulosin. - Continue tamsulosin 0.4mg  daily. - Follow up with Urology as instructed.

## 2024-05-23 NOTE — Progress Notes (Deleted)
   Established Patient Office Visit  Subjective   Patient ID: Robert Hines, male    DOB: 20-Sep-1955  Age: 68 y.o. MRN: [id number]  No chief complaint on file.   HPI    ROS    Objective:     There were no vitals taken for this visit.   Physical Exam   No results found for any visits on 05/23/24.    The ASCVD Risk score (Arnett DK, et al., 2019) failed to calculate for the following reasons:   The valid total cholesterol range is 130 to 320 mg/dL    Assessment & Plan:   Problem List Items Addressed This Visit       Cardiovascular and Mediastinum   Essential hypertension, benign - Primary     Endocrine   Diabetes mellitus, type 2 (HCC)   Diabetic neuropathy (HCC)    No follow-ups on file.    Derrek JINNY Freund, RN

## 2024-05-23 NOTE — Assessment & Plan Note (Signed)
 Blood pressure mildly elevated on arrival but recheck was at goal. - Continue lisinopril  40mg  daily. - Recommend home monitoring a couple of times a week with a goal of < 130/80.

## 2024-05-23 NOTE — Assessment & Plan Note (Signed)
 Managed with simvastatin . UTD on labs. Due for next lipid panel in 5-6 months. -Continue simvastatin  40mg  daily.

## 2024-05-23 NOTE — Assessment & Plan Note (Signed)
 Daily swelling. Previously evaluated by Vein specialist. Compression stockings not tolerated. Discussed alternative compression options. - Consider using Ace Wraps for compression if unable to tolerate/put on stockings.

## 2024-05-23 NOTE — Assessment & Plan Note (Signed)
 Chronic intractable pain now managed with [facility]. Treated with hydrocodone  3-4 times daily. Previous treatments ineffective. Considering spinal stimulator. - Continue hydrocodone  10 mg every six hours as needed. - Await approval for spinal stimulator. - Follow up with pain management as instructed.

## 2024-05-23 NOTE — Assessment & Plan Note (Signed)
 A1c increased to 7.0 from 6.8, attributed to dietary indiscretions. Current medications not fully effective. - Continue Lantus  93 units daily, Ozempic  2mg  weekly, metformin  1g BID, and glipizide  10mg  daily. - Recommend working on dietary modifications for better glucose control. - Monitor glucose at home with a fasting goal of 90-120.

## 2024-05-23 NOTE — Progress Notes (Signed)
 Established patient visit   History of Present Illness   Discussed the use of AI scribe software for clinical note transcription with the patient, who gave verbal consent to proceed.  History of Present Illness   Robert Hines is a 68 year old male with chronic pain and diabetes who presents for transfer of care to a new PCP and chronic disease follow-up. He is accompanied by his wife.  Chronic pain - Chronic lower back pain managed with hydrocodone  10 mg every six hours, typically taking fewer than three doses daily. - Previous treatments including epidural injections, muscle relaxers, anti-inflammatories, and steroids have been ineffective. - Alternative therapies such as Reiki and chiropractic care provide temporary relief. - UTD on follow ups with Pain Management  Diabetes mellitus - Diabetes managed with Lantus , metformin , Ozempic , and glipizide . - Most recent hemoglobin A1c was 6.8. - Occasional blood glucose monitoring, especially when feeling unwell. - Dietary challenges affecting glycemic control, particularly during holidays.  Peripheral edema - Daily swelling of the feet, ankles, and lower legs. - Compression stockings are difficult to wear due to tightness and wide foot size. - No longer able to put stockings on due to back pain, wife can't help since she had recent shoulder surgery - Podiatrist visits every three months for nail trimming, with swelling also noted during these visits.  Sleep disturbance - Uses Ambien  for sleep, typically a half pill (5mg ) nightly.  Hypertension - Taking lisinopril  40mg  daily, well tolerated - Only checks BP at home occasionally  Hyperlipidemia - Taking simvastatin  40mg  daily, well tolerated  Nephrolithiasis - Taking Flomax 0.4mg  daily - Recently passed a couple of kidney stones   Physical Exam   Physical Exam Vitals reviewed.  Constitutional:      General: He is not in acute distress.    Appearance: Normal  appearance. He is not ill-appearing.  HENT:     Head: Normocephalic and atraumatic.  Cardiovascular:     Rate and Rhythm: Normal rate and regular rhythm.     Pulses: Normal pulses.     Heart sounds: Normal heart sounds. No murmur heard.    No friction rub. No gallop.  Pulmonary:     Effort: Pulmonary effort is normal. No respiratory distress.     Breath sounds: Normal breath sounds.  Musculoskeletal:     Right lower leg: Edema present.     Left lower leg: Edema present.  Skin:    General: Skin is warm and dry.  Neurological:     Mental Status: He is alert and oriented to person, place, and time.  Psychiatric:        Mood and Affect: Mood normal.        Behavior: Behavior normal.        Thought Content: Thought content normal.        Judgment: Judgment normal.    Assessment & Plan   Problem List Items Addressed This Visit       Cardiovascular and Mediastinum   Essential hypertension, benign - Primary   Blood pressure mildly elevated on arrival but recheck was at goal. - Continue lisinopril  40mg  daily. - Recommend home monitoring a couple of times a week with a goal of < 130/80.      Relevant Medications   simvastatin  (ZOCOR ) 40 MG tablet     Endocrine   Diabetes mellitus, type 2 (HCC)   A1c increased to 7.0 from 6.8, attributed to dietary indiscretions. Current medications not fully  effective. - Continue Lantus  93 units daily, Ozempic  2mg  weekly, metformin  1g BID, and glipizide  10mg  daily. - Recommend working on dietary modifications for better glucose control. - Monitor glucose at home with a fasting goal of 90-120.      Relevant Medications   simvastatin  (ZOCOR ) 40 MG tablet   insulin  glargine (LANTUS  SOLOSTAR) 100 UNIT/ML Solostar Pen   Other Relevant Orders   POCT HgB A1C (Completed)     Musculoskeletal and Integument   Lumbar spondylosis   Chronic intractable pain now managed with [facility]. Treated with hydrocodone  3-4 times daily. Previous  treatments ineffective. Considering spinal stimulator. - Continue hydrocodone  10 mg every six hours as needed. - Await approval for spinal stimulator. - Follow up with pain management as instructed.         Genitourinary   Nephrolithiasis   Recent passage of 4 mm stone. Managed with tamsulosin. - Continue tamsulosin 0.4mg  daily. - Follow up with Urology as instructed.      Relevant Medications   HYDROcodone -acetaminophen  (NORCO) 7.5-325 MG tablet     Other   Bilateral lower extremity edema   Daily swelling. Previously evaluated by Vein specialist. Compression stockings not tolerated. Discussed alternative compression options. - Consider using Ace Wraps for compression if unable to tolerate/put on stockings.      Depression   Doing well on Wellbutrin  and feels his symptoms are well controlled. Denies SI/HI. - Continue Wellbutrin  XL 300mg  daily.       Hyperlipidemia   Managed with simvastatin . UTD on labs. Due for next lipid panel in 5-6 months. -Continue simvastatin  40mg  daily.       Relevant Medications   simvastatin  (ZOCOR ) 40 MG tablet   Primary insomnia   Long term treatment with Ambien . Now only using 5mg  nightly which is effective and well tolerated.  - Continue Ambien  5mg  nightly.       Follow up   Return in about 6 months (around 11/21/2024) for chronic disease follow up. __________________________________ Zada FREDRIK Palin, DNP, APRN, FNP-BC Primary Care and Sports Medicine [facility] [place]

## 2024-05-23 NOTE — Assessment & Plan Note (Signed)
 Doing well on Wellbutrin  and feels his symptoms are well controlled. Denies SI/HI. - Continue Wellbutrin  XL 300mg  daily.

## 2024-05-31 NOTE — Progress Notes (Signed)
 Robert Hines                                          MRN: [id number]   05/31/2024   The [facility] Quality Team Specialist reviewed this patient medical record for the purposes of chart review for care gap closure. The following were reviewed: abstraction for care gap closure-controlling blood pressure.    [facility] Quality Team

## 2024-06-04 DIAGNOSIS — G894 Chronic pain syndrome: Secondary | ICD-10-CM | POA: Diagnosis not present

## 2024-06-04 DIAGNOSIS — M5416 Radiculopathy, lumbar region: Secondary | ICD-10-CM | POA: Diagnosis not present

## 2024-06-04 DIAGNOSIS — M48061 Spinal stenosis, lumbar region without neurogenic claudication: Secondary | ICD-10-CM | POA: Diagnosis not present

## 2024-06-27 ENCOUNTER — Encounter: Payer: Self-pay | Admitting: Medical-Surgical

## 2024-06-27 NOTE — Telephone Encounter (Signed)
 Attempted call to patient . Patient chart showing Zada Palin, NP as PCP since 05/23/2024. I wanted to ask the patient where he is seeing that Dr. Curtis was still his PCP so that I could correct this.

## 2024-06-28 NOTE — Telephone Encounter (Signed)
 Attempted call to patient. Left a voicemail message requesting a return call. Left my direct # for return call.

## 2024-07-16 ENCOUNTER — Encounter: Payer: Self-pay | Admitting: Medical-Surgical

## 2024-07-16 ENCOUNTER — Ambulatory Visit: Payer: Self-pay

## 2024-07-16 ENCOUNTER — Ambulatory Visit

## 2024-07-16 ENCOUNTER — Ambulatory Visit: Admitting: Medical-Surgical

## 2024-07-16 VITALS — BP 172/75 | HR 107 | Temp 98.7°F | Resp 18 | Ht 73.0 in | Wt 237.1 lb

## 2024-07-16 DIAGNOSIS — R0602 Shortness of breath: Secondary | ICD-10-CM

## 2024-07-16 DIAGNOSIS — R059 Cough, unspecified: Secondary | ICD-10-CM

## 2024-07-16 DIAGNOSIS — J0100 Acute maxillary sinusitis, unspecified: Secondary | ICD-10-CM

## 2024-07-16 DIAGNOSIS — R0989 Other specified symptoms and signs involving the circulatory and respiratory systems: Secondary | ICD-10-CM | POA: Diagnosis not present

## 2024-07-16 DIAGNOSIS — R0681 Apnea, not elsewhere classified: Secondary | ICD-10-CM

## 2024-07-16 MED ORDER — PREDNISONE 50 MG PO TABS: 50.0000 mg | ORAL_TABLET | Freq: Every day | ORAL | 0 refills | Status: AC

## 2024-07-16 MED ORDER — DOXYCYCLINE HYCLATE 100 MG PO TABS: 100.0000 mg | ORAL_TABLET | Freq: Two times a day (BID) | ORAL | 0 refills | Status: AC

## 2024-07-16 NOTE — Progress Notes (Signed)
 "       Established patient visit   History of Present Illness   Discussed the use of AI scribe software for clinical note transcription with the patient, who gave verbal consent to proceed.  History of Present Illness   Robert Hines is a 69 year old male who presents with sinus pain and congestion.  Sinus pain and congestion - Sinus pain and congestion present for a little over one week - Intermittent yellow to green nasal discharge - Pressure in ears, behind eyes, and in head, worsened by bending over - Rattling sensation when coughing but minimal sputum production - Slight, intermittently checked fever - Alternating Advil, Tylenol , and [facility]-brand Nyquil for symptom relief   Witness apneic episodes - Wife notes that patient snores loudly and has witnessed him stop breathing when asleep - Sleep study ordered a few years ago but no one contacted them to schedule the procedure    Physical Exam   Physical Exam Vitals and nursing note reviewed.  Constitutional:      General: He is not in acute distress.    Appearance: Normal appearance. He is not ill-appearing.  HENT:     Head: Normocephalic and atraumatic.     Right Ear: Ear canal and external ear normal. A middle ear effusion is present. There is no impacted cerumen.     Left Ear: Ear canal and external ear normal. A middle ear effusion is present. There is no impacted cerumen.     Nose: Congestion present.     Right Sinus: Maxillary sinus tenderness present. No frontal sinus tenderness.     Left Sinus: Maxillary sinus tenderness present. No frontal sinus tenderness.     Mouth/Throat:     Mouth: Mucous membranes are moist.     Pharynx: No posterior oropharyngeal erythema.  Eyes:     General:        Right eye: No discharge.        Left eye: No discharge.     Extraocular Movements: Extraocular movements intact.     Conjunctiva/sclera: Conjunctivae normal.     Pupils: Pupils are equal, round, and reactive to light.   Cardiovascular:     Rate and Rhythm: Normal rate and regular rhythm.     Pulses: Normal pulses.     Heart sounds: Normal heart sounds. No murmur heard.    No friction rub. No gallop.  Pulmonary:     Effort: Pulmonary effort is normal. No respiratory distress.     Breath sounds: Rhonchi (bilateral bases posteriorly) present.  Lymphadenopathy:     Cervical: No cervical adenopathy.  Skin:    General: Skin is warm and dry.  Neurological:     Mental Status: He is alert and oriented to person, place, and time.  Psychiatric:        Mood and Affect: Mood normal.        Behavior: Behavior normal.        Thought Content: Thought content normal.        Judgment: Judgment normal.    Assessment & Plan   Acute non-recurrent maxillary sinusitis Sinus pain, congestion, and pressure with yellow nasal discharge. Symptoms persist over a week.  - Continue Advil and Tylenol  for symptom relief. - Consider Nyquil HBP or Coricidin for symptoms due to history of HTN and elevated reading today.  - Greater than 1 week of symptoms so treating with Doxycycline  100mg  BID x 7 days.  - Adding Prednisone  taper; advised to closely monitor blood  sugar and warned of expected elevations. - Chest x-ray ordered.  Witnessed apneic episodes Stong suspicion of sleep apnea with loud snoring and apneic episodes. Prior sleep study not completed.  - Home sleep study ordered.     Follow up   Return if symptoms worsen or fail to improve. __________________________________ Zada FREDRIK Palin, DNP, APRN, FNP-BC Primary Care and Sports Medicine [facility] [place] "

## 2024-07-16 NOTE — Telephone Encounter (Signed)
 FYI Only or Action Required?: FYI only for provider: appointment scheduled on 07/16/24.  Patient was last seen in primary care on 05/23/2024 by Willo Mini, NP.  Called Nurse Triage reporting Sinusitis, Cough, and Headache.  Symptoms began several days ago.  Interventions attempted: OTC medications: Tylenol , Advil, Nyquil.  Symptoms are: gradually worsening.  Triage Disposition: See Physician Within 24 Hours  Patient/caregiver understands and will follow disposition?: Yes                    Message from [facility] D sent at 07/16/2024  1:11 PM EST  Pt has been sick 5 or 6 days-  Has fever, mucus and swollen mucus   Reason for Disposition  Earache  Answer Assessment - Initial Assessment Questions 1. LOCATION: Where does it hurt?      All of the above: temples, sides of face, back of head.  2. ONSET: When did the sinus pain start?  (e.g., hours, days)      5-6 days ago  3. SEVERITY: How bad is the pain?   (Scale 0-10; or none, mild, moderate or severe)     4-5/10.  4. RECURRENT SYMPTOM: Have you ever had sinus problems before? If Yes, ask: When was the last time? and What happened that time?      Yes, not routinely but states he had one in the past year and was given a Z pack.  5. NASAL CONGESTION: Is the nose blocked? If Yes, ask: Can you open it or must you breathe through your mouth?     Yes, has hard time breathing when nasal sinuses get blocked. Sometimes can open it up and sometimes not.  6. NASAL DISCHARGE: Do you have discharge from your nose? If so ask, What color?     Yes, sometimes clear to light green.  7. FEVER: Do you have a fever? If Yes, ask: What is it, how was it measured, and when did it start?      No fever (temperature 99.7).  8. OTHER SYMPTOMS: Do you have any other symptoms? (e.g., sore throat, cough, earache, difficulty breathing)     Earaches, sore throat. Started as headache, cough, sinus pain and  congestion, runny nose. No difficulty breathing.  Protocols used: Sinus Pain or Congestion-A-AH

## 2024-07-19 ENCOUNTER — Ambulatory Visit: Payer: Self-pay | Admitting: Medical-Surgical

## 2024-08-02 ENCOUNTER — Ambulatory Visit: Admitting: Sports Medicine

## 2024-10-30 ENCOUNTER — Encounter: Admitting: Sports Medicine

## 2024-11-14 ENCOUNTER — Ambulatory Visit

## 2024-11-22 ENCOUNTER — Ambulatory Visit: Admitting: Medical-Surgical
